# Patient Record
Sex: Female | Born: 1945
Health system: Southern US, Community
[De-identification: ages and names within clinical notes are randomized; demographics above are authoritative.]

## PROBLEM LIST (undated history)

## (undated) DIAGNOSIS — R112 Nausea with vomiting, unspecified: Secondary | ICD-10-CM

## (undated) DIAGNOSIS — T4145XA Adverse effect of unspecified anesthetic, initial encounter: Secondary | ICD-10-CM

## (undated) DIAGNOSIS — C4491 Basal cell carcinoma of skin, unspecified: Secondary | ICD-10-CM

## (undated) DIAGNOSIS — E0500 Thyrotoxicosis with diffuse goiter without thyrotoxic crisis or storm: Secondary | ICD-10-CM

## (undated) DIAGNOSIS — M1990 Unspecified osteoarthritis, unspecified site: Secondary | ICD-10-CM

## (undated) DIAGNOSIS — Z923 Personal history of irradiation: Secondary | ICD-10-CM

## (undated) DIAGNOSIS — H8110 Benign paroxysmal vertigo, unspecified ear: Secondary | ICD-10-CM

## (undated) DIAGNOSIS — J302 Other seasonal allergic rhinitis: Secondary | ICD-10-CM

## (undated) DIAGNOSIS — F411 Generalized anxiety disorder: Secondary | ICD-10-CM

## (undated) DIAGNOSIS — R351 Nocturia: Secondary | ICD-10-CM

## (undated) DIAGNOSIS — C50919 Malignant neoplasm of unspecified site of unspecified female breast: Secondary | ICD-10-CM

## (undated) DIAGNOSIS — K900 Celiac disease: Secondary | ICD-10-CM

## (undated) DIAGNOSIS — N838 Other noninflammatory disorders of ovary, fallopian tube and broad ligament: Secondary | ICD-10-CM

## (undated) DIAGNOSIS — Z9889 Other specified postprocedural states: Secondary | ICD-10-CM

## (undated) DIAGNOSIS — R42 Dizziness and giddiness: Secondary | ICD-10-CM

## (undated) DIAGNOSIS — Z789 Other specified health status: Secondary | ICD-10-CM

## (undated) DIAGNOSIS — R200 Anesthesia of skin: Secondary | ICD-10-CM

## (undated) DIAGNOSIS — I10 Essential (primary) hypertension: Secondary | ICD-10-CM

## (undated) DIAGNOSIS — R202 Paresthesia of skin: Secondary | ICD-10-CM

## (undated) DIAGNOSIS — Z8489 Family history of other specified conditions: Secondary | ICD-10-CM

## (undated) DIAGNOSIS — H9190 Unspecified hearing loss, unspecified ear: Secondary | ICD-10-CM

## (undated) DIAGNOSIS — J45909 Unspecified asthma, uncomplicated: Secondary | ICD-10-CM

## (undated) DIAGNOSIS — R011 Cardiac murmur, unspecified: Secondary | ICD-10-CM

## (undated) DIAGNOSIS — F325 Major depressive disorder, single episode, in full remission: Secondary | ICD-10-CM

## (undated) DIAGNOSIS — Z973 Presence of spectacles and contact lenses: Secondary | ICD-10-CM

## (undated) DIAGNOSIS — K219 Gastro-esophageal reflux disease without esophagitis: Secondary | ICD-10-CM

## (undated) DIAGNOSIS — I499 Cardiac arrhythmia, unspecified: Secondary | ICD-10-CM

## (undated) HISTORY — DX: Other noninflammatory disorders of ovary, fallopian tube and broad ligament: N83.8

## (undated) HISTORY — DX: Thyrotoxicosis with diffuse goiter without thyrotoxic crisis or storm: E05.00

## (undated) HISTORY — DX: Basal cell carcinoma of skin, unspecified: C44.91

## (undated) HISTORY — PX: MOHS SURGERY: SUR867

## (undated) HISTORY — PX: EYE SURGERY: SHX253

## (undated) HISTORY — DX: Major depressive disorder, single episode, in full remission: F32.5

## (undated) HISTORY — DX: Other specified health status: Z78.9

## (undated) HISTORY — DX: Malignant neoplasm of unspecified site of unspecified female breast: C50.919

## (undated) HISTORY — PX: CATARACT EXTRACTION: SUR2

## (undated) HISTORY — DX: Generalized anxiety disorder: F41.1

## (undated) HISTORY — PX: APPENDECTOMY: SHX54

---
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
## 1972-04-24 HISTORY — PX: OVARY SURGERY: SHX727

## 1985-04-24 HISTORY — PX: BREAST BIOPSY: SHX20

## 1992-04-24 DIAGNOSIS — C4491 Basal cell carcinoma of skin, unspecified: Secondary | ICD-10-CM

## 1992-04-24 HISTORY — DX: Basal cell carcinoma of skin, unspecified: C44.91

## 1992-10-22 HISTORY — PX: OTHER SURGICAL HISTORY: SHX169

## 1993-04-24 DIAGNOSIS — C50919 Malignant neoplasm of unspecified site of unspecified female breast: Secondary | ICD-10-CM

## 1993-04-24 HISTORY — DX: Malignant neoplasm of unspecified site of unspecified female breast: C50.919

## 1993-06-22 HISTORY — PX: MASTECTOMY: SHX3

## 1998-03-12 ENCOUNTER — Other Ambulatory Visit: Admission: RE | Admit: 1998-03-12 | Discharge: 1998-03-12 | Payer: Self-pay | Admitting: *Deleted

## 1998-04-09 ENCOUNTER — Ambulatory Visit (HOSPITAL_COMMUNITY): Admission: RE | Admit: 1998-04-09 | Discharge: 1998-04-09 | Payer: Self-pay | Admitting: Gastroenterology

## 1999-01-10 ENCOUNTER — Other Ambulatory Visit: Admission: RE | Admit: 1999-01-10 | Discharge: 1999-01-10 | Payer: Self-pay | Admitting: *Deleted

## 1999-04-25 DIAGNOSIS — Z923 Personal history of irradiation: Secondary | ICD-10-CM | POA: Insufficient documentation

## 1999-04-25 HISTORY — DX: Personal history of irradiation: Z92.3

## 2000-01-23 HISTORY — PX: OTHER SURGICAL HISTORY: SHX169

## 2000-01-27 ENCOUNTER — Other Ambulatory Visit: Admission: RE | Admit: 2000-01-27 | Discharge: 2000-01-27 | Payer: Self-pay | Admitting: *Deleted

## 2000-02-21 ENCOUNTER — Other Ambulatory Visit: Admission: RE | Admit: 2000-02-21 | Discharge: 2000-02-21 | Payer: Self-pay | Admitting: General Surgery

## 2000-03-12 ENCOUNTER — Encounter: Payer: Self-pay | Admitting: Oncology

## 2000-03-12 ENCOUNTER — Encounter: Admission: RE | Admit: 2000-03-12 | Discharge: 2000-03-12 | Payer: Self-pay | Admitting: Oncology

## 2000-04-24 ENCOUNTER — Encounter: Admission: RE | Admit: 2000-04-24 | Discharge: 2000-07-23 | Payer: Self-pay | Admitting: Radiation Oncology

## 2001-01-28 ENCOUNTER — Other Ambulatory Visit: Admission: RE | Admit: 2001-01-28 | Discharge: 2001-01-28 | Payer: Self-pay | Admitting: *Deleted

## 2001-03-14 ENCOUNTER — Ambulatory Visit (HOSPITAL_COMMUNITY): Admission: RE | Admit: 2001-03-14 | Discharge: 2001-03-14 | Payer: Self-pay | Admitting: Oncology

## 2001-03-14 ENCOUNTER — Encounter: Payer: Self-pay | Admitting: Oncology

## 2002-01-02 ENCOUNTER — Other Ambulatory Visit: Admission: RE | Admit: 2002-01-02 | Discharge: 2002-01-02 | Payer: Self-pay | Admitting: *Deleted

## 2002-01-20 ENCOUNTER — Encounter: Admission: RE | Admit: 2002-01-20 | Discharge: 2002-01-20 | Payer: Self-pay | Admitting: Internal Medicine

## 2002-02-07 ENCOUNTER — Encounter: Admission: RE | Admit: 2002-02-07 | Discharge: 2002-02-07 | Payer: Self-pay | Admitting: Internal Medicine

## 2003-01-15 ENCOUNTER — Other Ambulatory Visit: Admission: RE | Admit: 2003-01-15 | Discharge: 2003-01-15 | Payer: Self-pay | Admitting: *Deleted

## 2003-03-10 ENCOUNTER — Encounter (INDEPENDENT_AMBULATORY_CARE_PROVIDER_SITE_OTHER): Payer: Self-pay

## 2003-03-10 ENCOUNTER — Ambulatory Visit (HOSPITAL_COMMUNITY): Admission: RE | Admit: 2003-03-10 | Discharge: 2003-03-10 | Payer: Self-pay | Admitting: Gastroenterology

## 2003-08-10 ENCOUNTER — Ambulatory Visit (HOSPITAL_COMMUNITY): Admission: RE | Admit: 2003-08-10 | Discharge: 2003-08-10 | Payer: Self-pay | Admitting: Internal Medicine

## 2003-08-10 IMAGING — CR DG CHEST 2V
2 series · 2 of 2 positions shown · non-contrast
Comparison: none

CLINICAL DATA: Positive PPD.  
 PA AND LATERAL CHEST:  
 Heart and mediastinal contours are within normal limits.  The lung fields are clear with no evidence for focal infiltrate or congestive failure.  No radiographic stigmata of tuberculosis are noted in this patient with a history of positive PPD.  No signs to suggest metastatic disease are noted in this patient with a history of breast cancer.  
 Bony structures demonstrate mild thoracic scoliosis convex right.  Surgical clips are identified in the left axillary region and anterior left chest wall as the patient is status post left mastectomy.
 IMPRESSION
 No acute cardiopulmonary disease.

[view not recorded (1 of 2)]
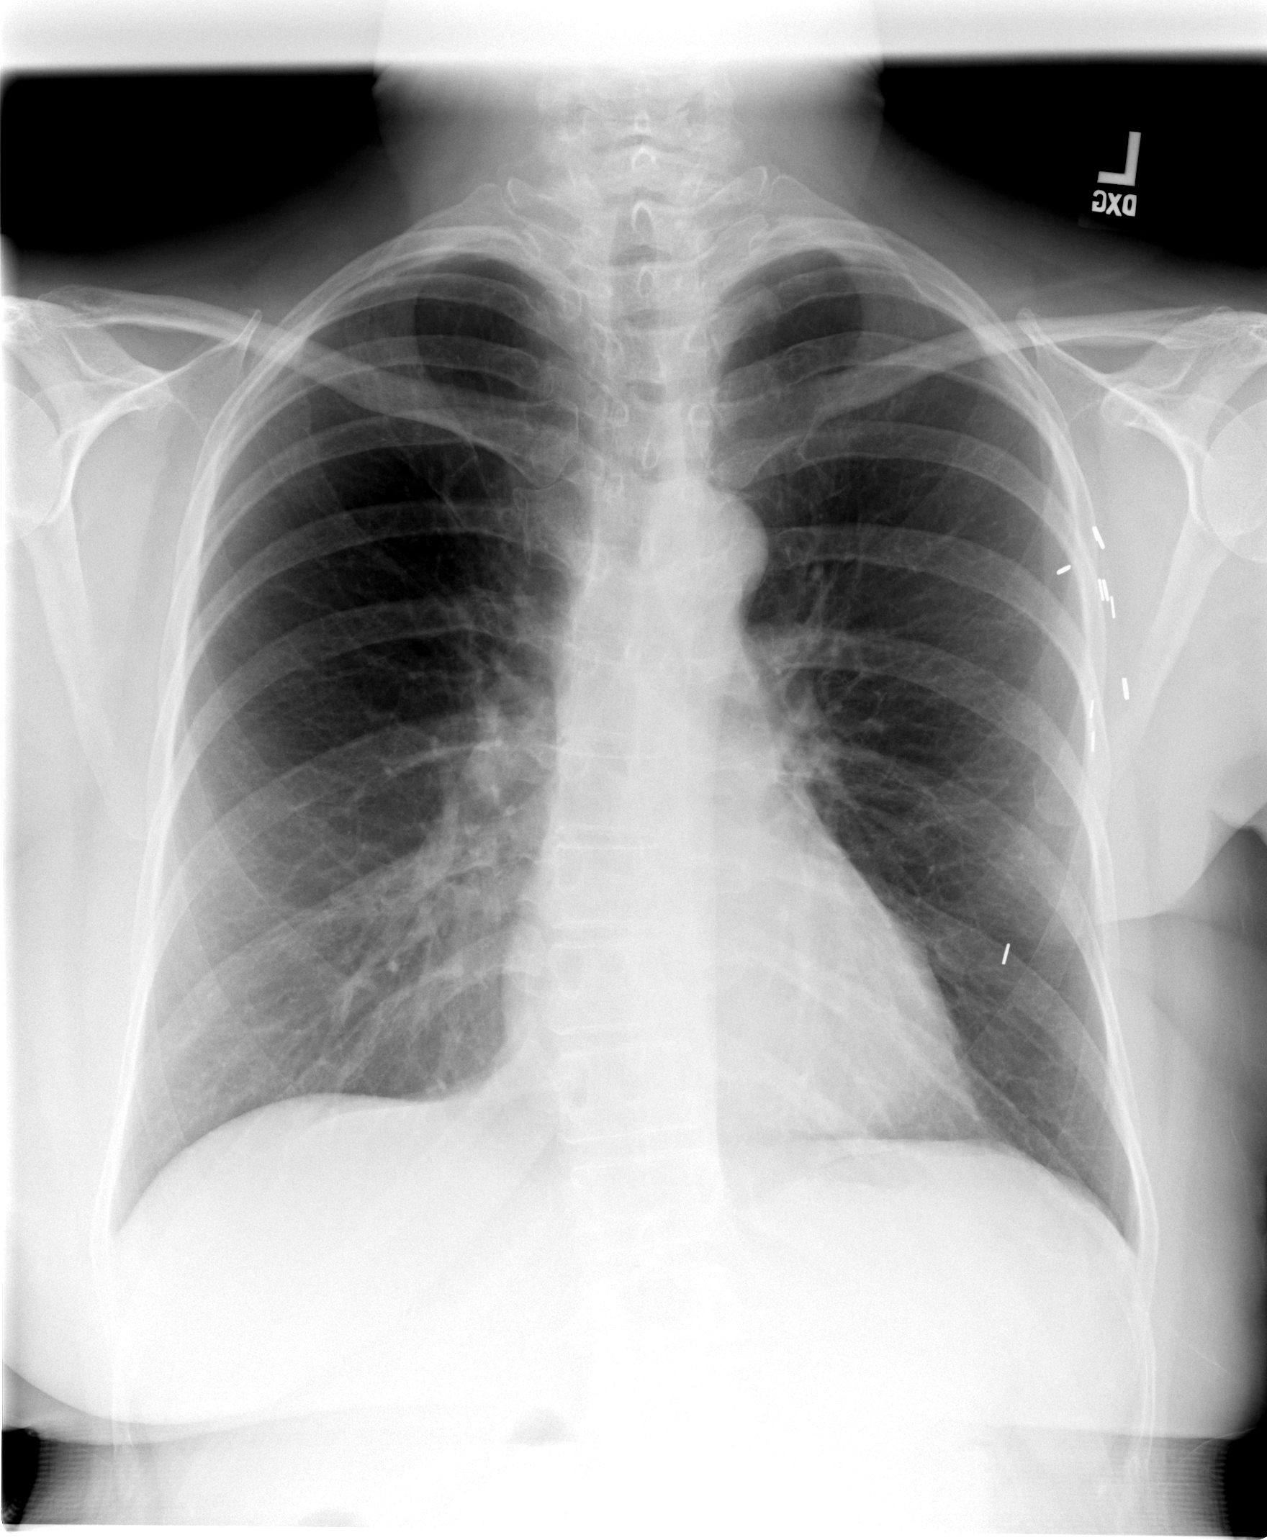

[view not recorded (2 of 2)]
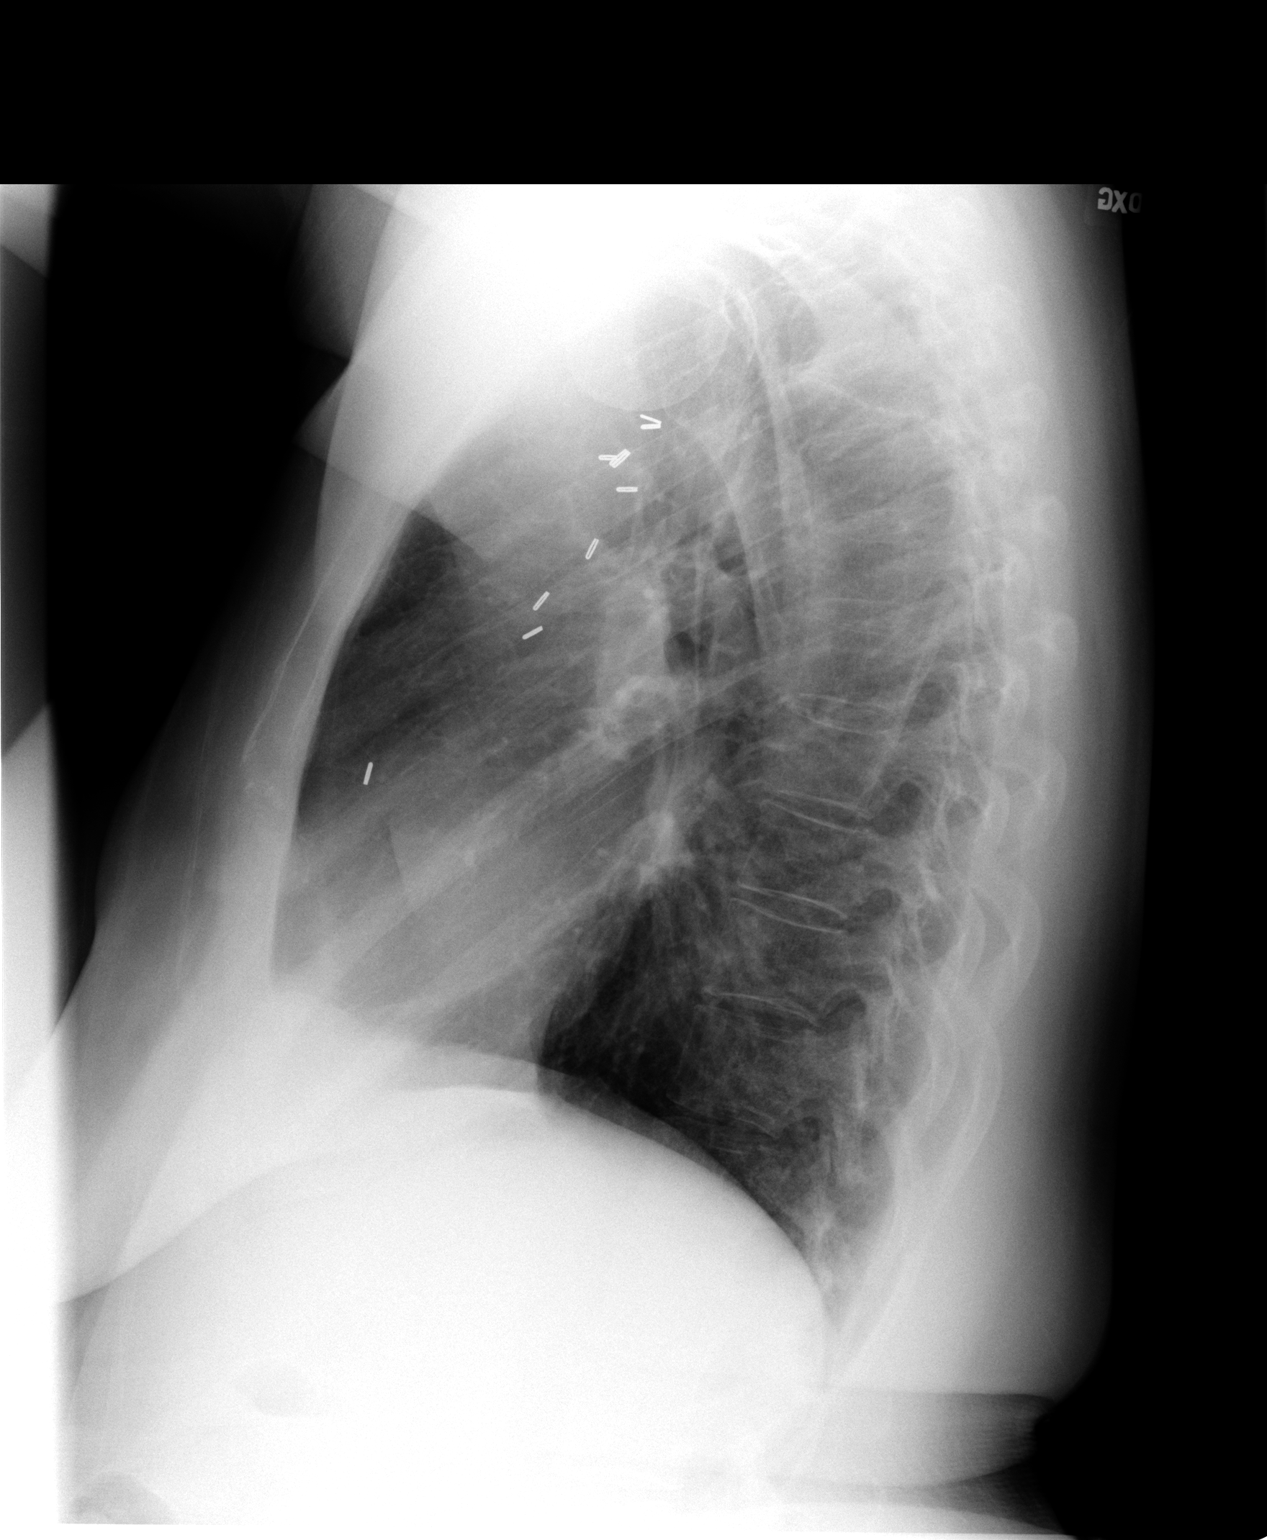

[2 of 2 positions shown; findings below may reference images not displayed]

## 2004-05-09 ENCOUNTER — Ambulatory Visit: Payer: Self-pay | Admitting: Oncology

## 2004-11-04 ENCOUNTER — Ambulatory Visit: Payer: Self-pay | Admitting: Oncology

## 2005-04-24 DIAGNOSIS — E0500 Thyrotoxicosis with diffuse goiter without thyrotoxic crisis or storm: Secondary | ICD-10-CM | POA: Insufficient documentation

## 2005-04-24 HISTORY — DX: Thyrotoxicosis with diffuse goiter without thyrotoxic crisis or storm: E05.00

## 2005-05-05 ENCOUNTER — Ambulatory Visit: Payer: Self-pay | Admitting: Oncology

## 2005-06-01 ENCOUNTER — Ambulatory Visit: Payer: Self-pay | Admitting: Oncology

## 2005-12-20 ENCOUNTER — Other Ambulatory Visit: Admission: RE | Admit: 2005-12-20 | Discharge: 2005-12-20 | Payer: Self-pay | Admitting: Obstetrics & Gynecology

## 2006-01-17 ENCOUNTER — Ambulatory Visit (HOSPITAL_COMMUNITY): Admission: RE | Admit: 2006-01-17 | Discharge: 2006-01-17 | Payer: Self-pay | Admitting: Internal Medicine

## 2006-01-17 IMAGING — CR DG CHEST 2V
2 series · 2 of 2 positions shown · non-contrast
Comparison: [DATE].

CLINICAL DATA: Positive PPD.  Breast cancer.
 CHEST ? 2 VIEW:

[view not recorded (1 of 2)]
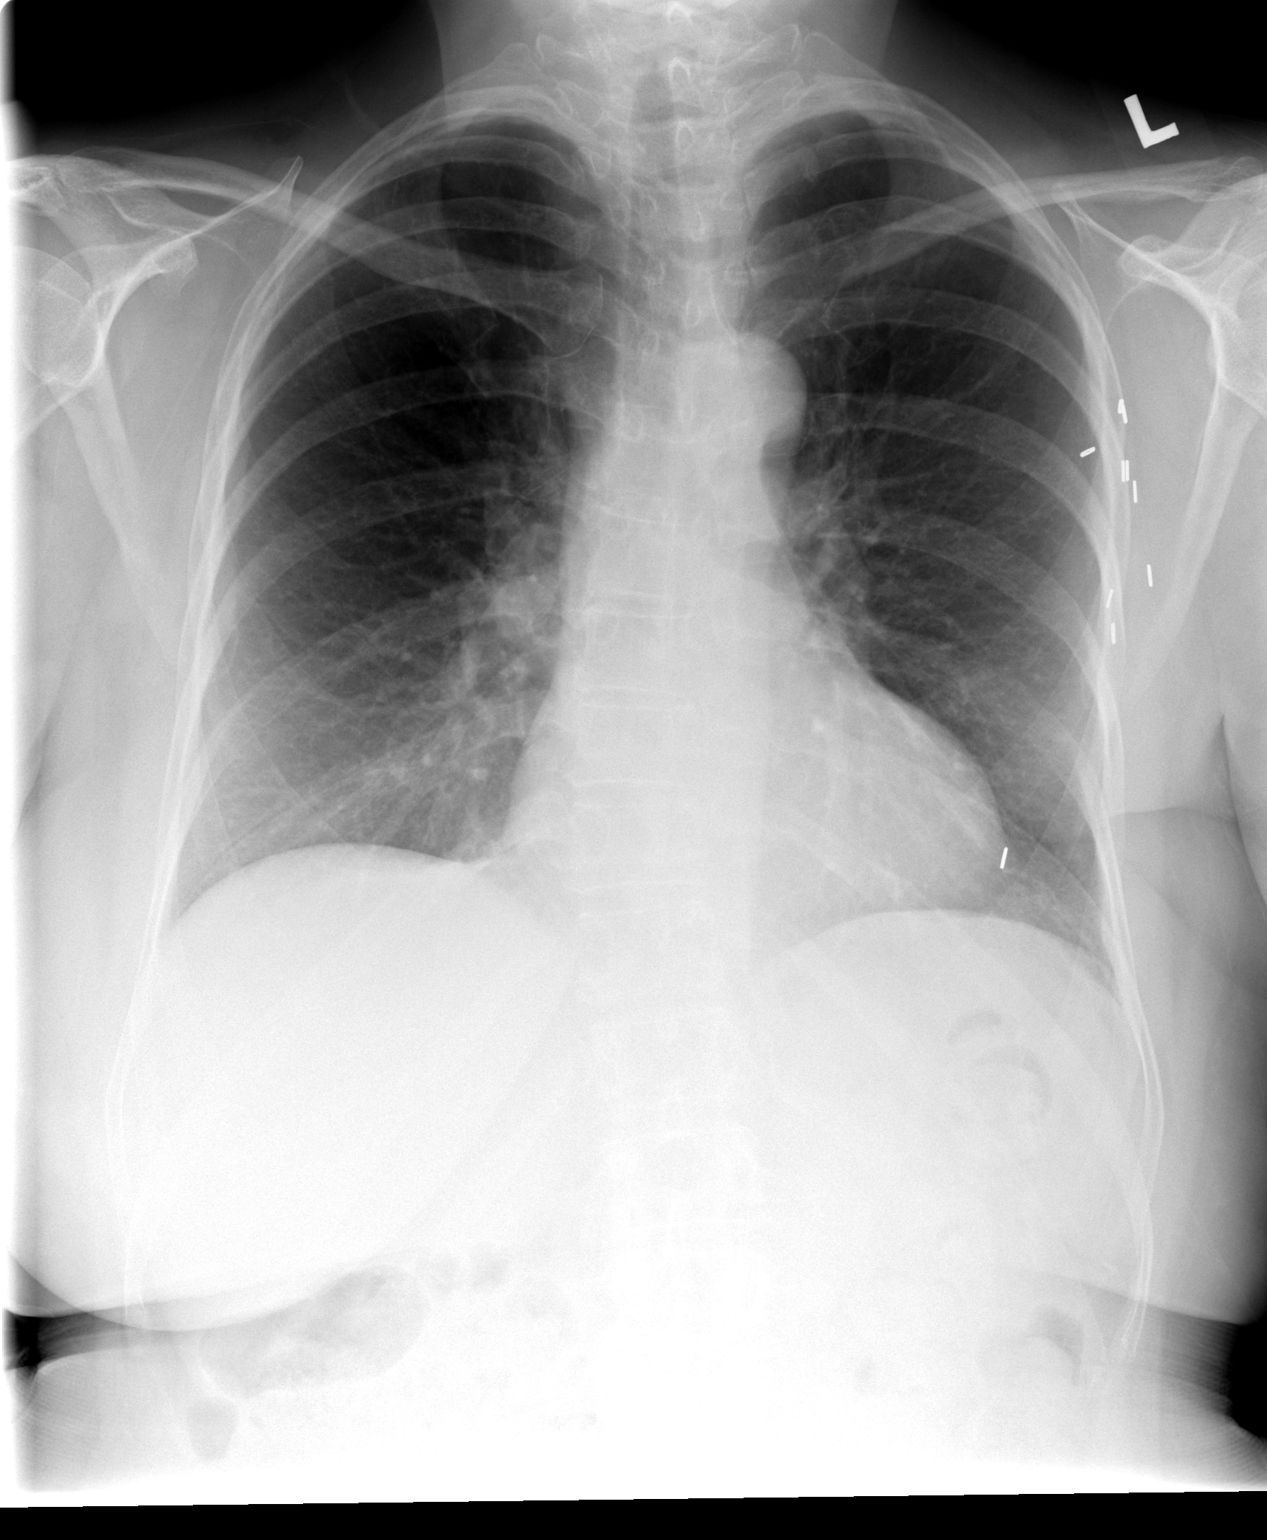

[view not recorded (2 of 2)]
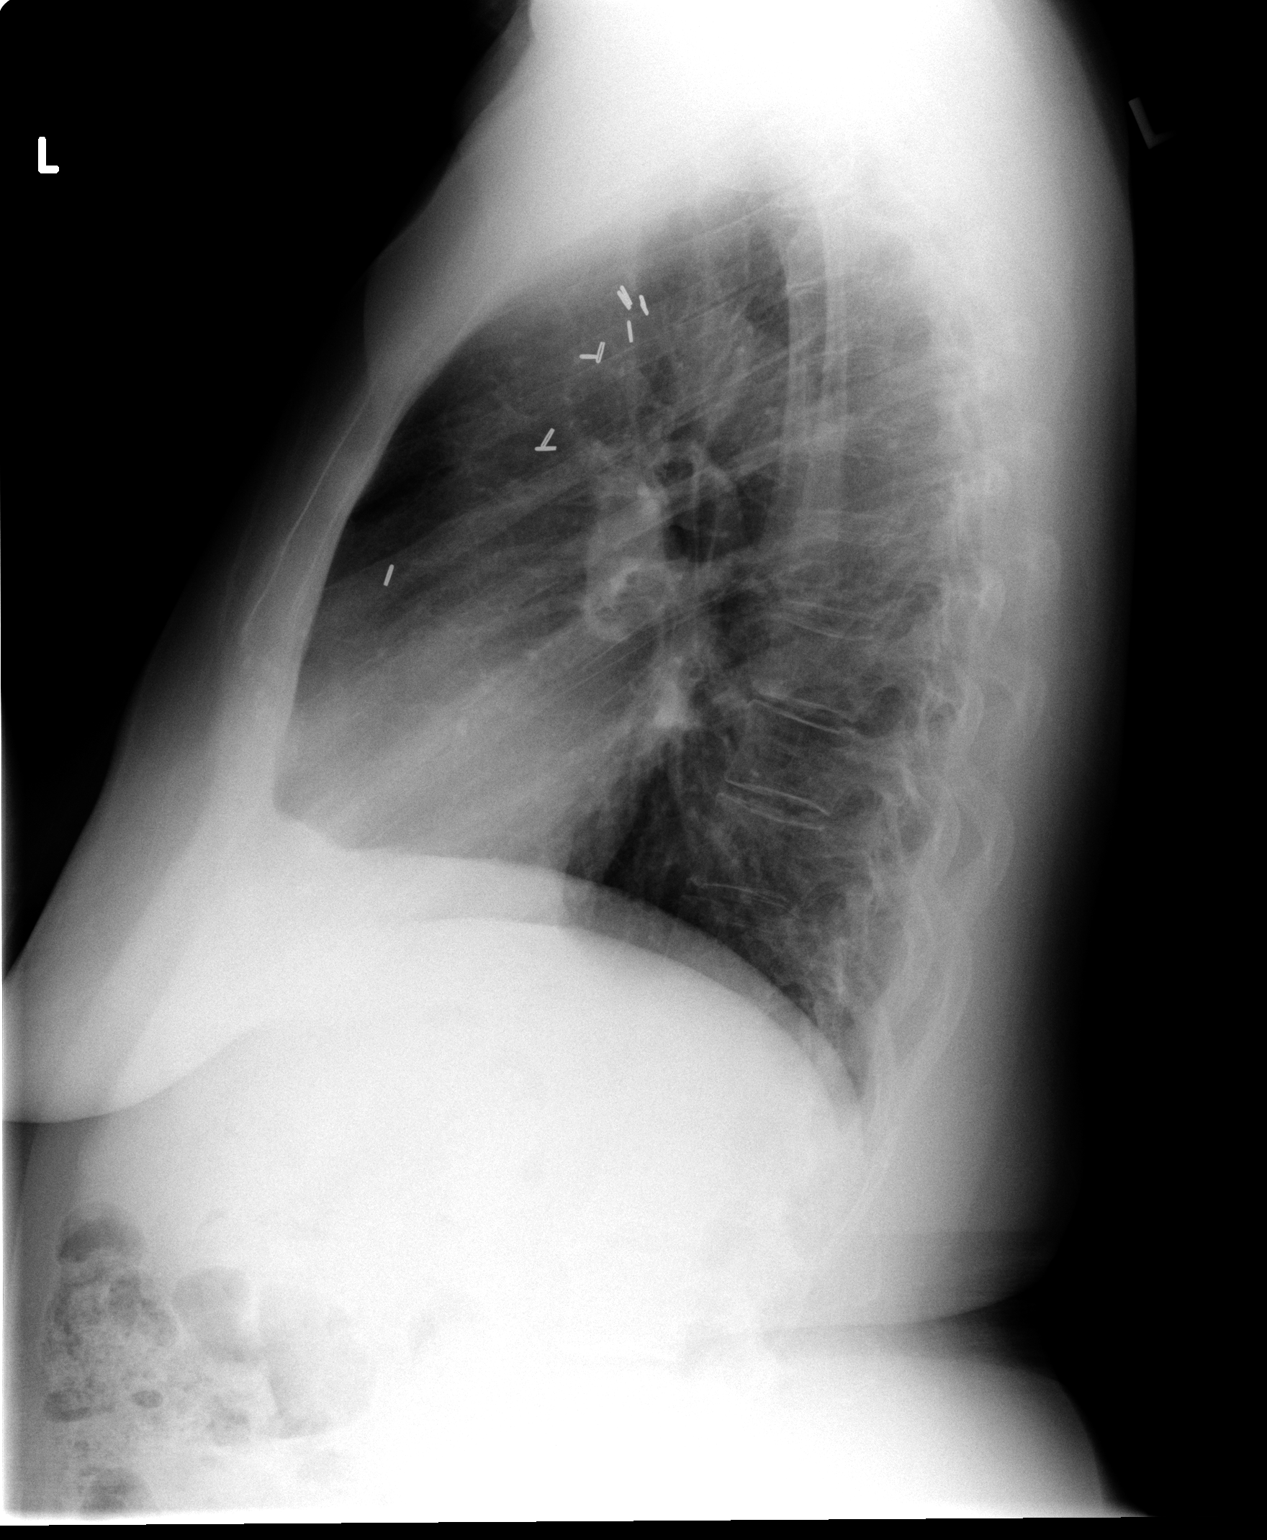

[2 of 2 positions shown; findings below may reference images not displayed]

FINDINGS: Heart size and mediastinal contours are normal.  Both lungs are clear.  There is no evidence of pleural effusion.  No mass or adenopathy identified.  The patient has undergone previous left mastectomy and axillary lymph node dissection.  No significant change is seen since the prior study.
IMPRESSION: Stable chest.  No active disease.

## 2006-01-26 ENCOUNTER — Ambulatory Visit: Payer: Self-pay | Admitting: Oncology

## 2006-11-02 ENCOUNTER — Ambulatory Visit: Payer: Self-pay | Admitting: Oncology

## 2006-12-25 ENCOUNTER — Other Ambulatory Visit: Admission: RE | Admit: 2006-12-25 | Discharge: 2006-12-25 | Payer: Self-pay | Admitting: Obstetrics & Gynecology

## 2007-08-13 ENCOUNTER — Ambulatory Visit: Payer: Self-pay | Admitting: Oncology

## 2008-01-01 ENCOUNTER — Other Ambulatory Visit: Admission: RE | Admit: 2008-01-01 | Discharge: 2008-01-01 | Payer: Self-pay | Admitting: Obstetrics & Gynecology

## 2008-05-12 ENCOUNTER — Ambulatory Visit: Payer: Self-pay | Admitting: Oncology

## 2008-11-09 ENCOUNTER — Ambulatory Visit (HOSPITAL_COMMUNITY): Admission: RE | Admit: 2008-11-09 | Discharge: 2008-11-09 | Payer: Self-pay | Admitting: Internal Medicine

## 2008-11-09 IMAGING — CR DG CHEST 2V
2 series · 2 of 2 positions shown · non-contrast
Comparison: [DATE]

CLINICAL DATA: Positive PPD.  Morning cough.  History of breast
cancer [SW] with recurrence of [SW].  Nonsmoker

CHEST - 2 VIEW

[view not recorded (1 of 2)]
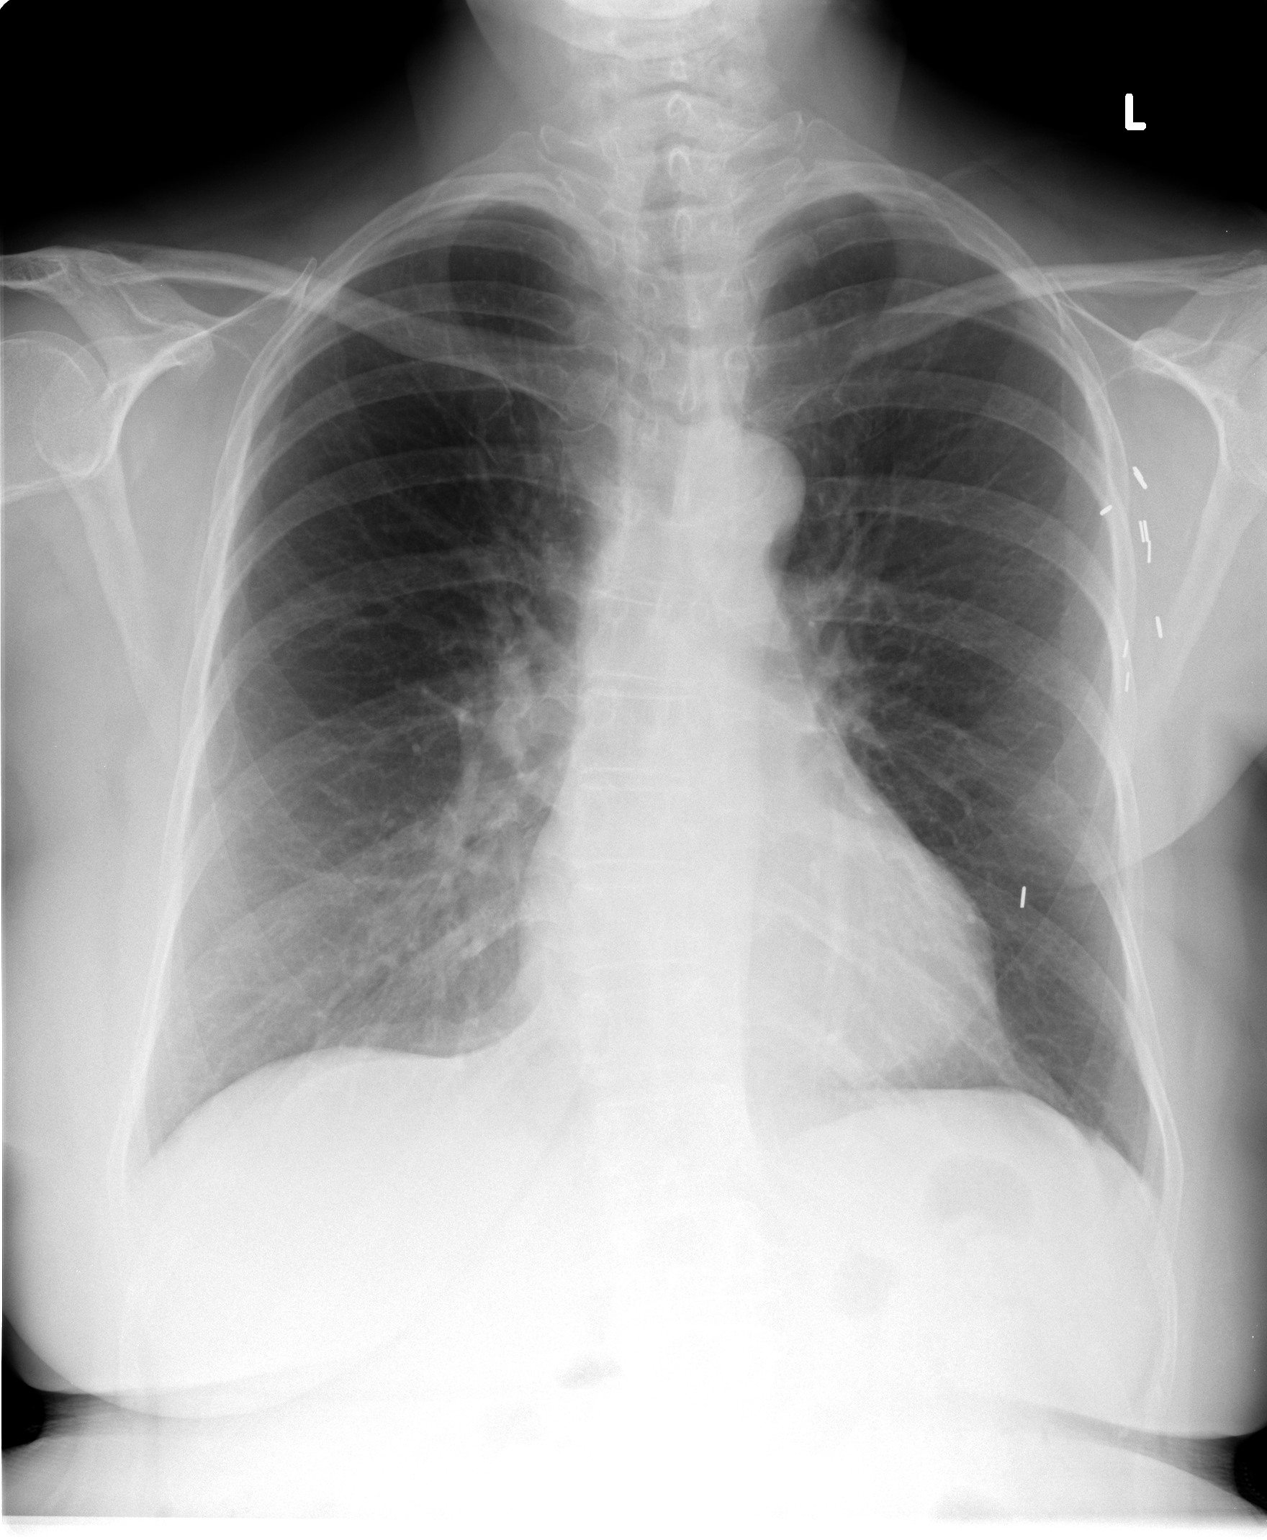

[view not recorded (2 of 2)]
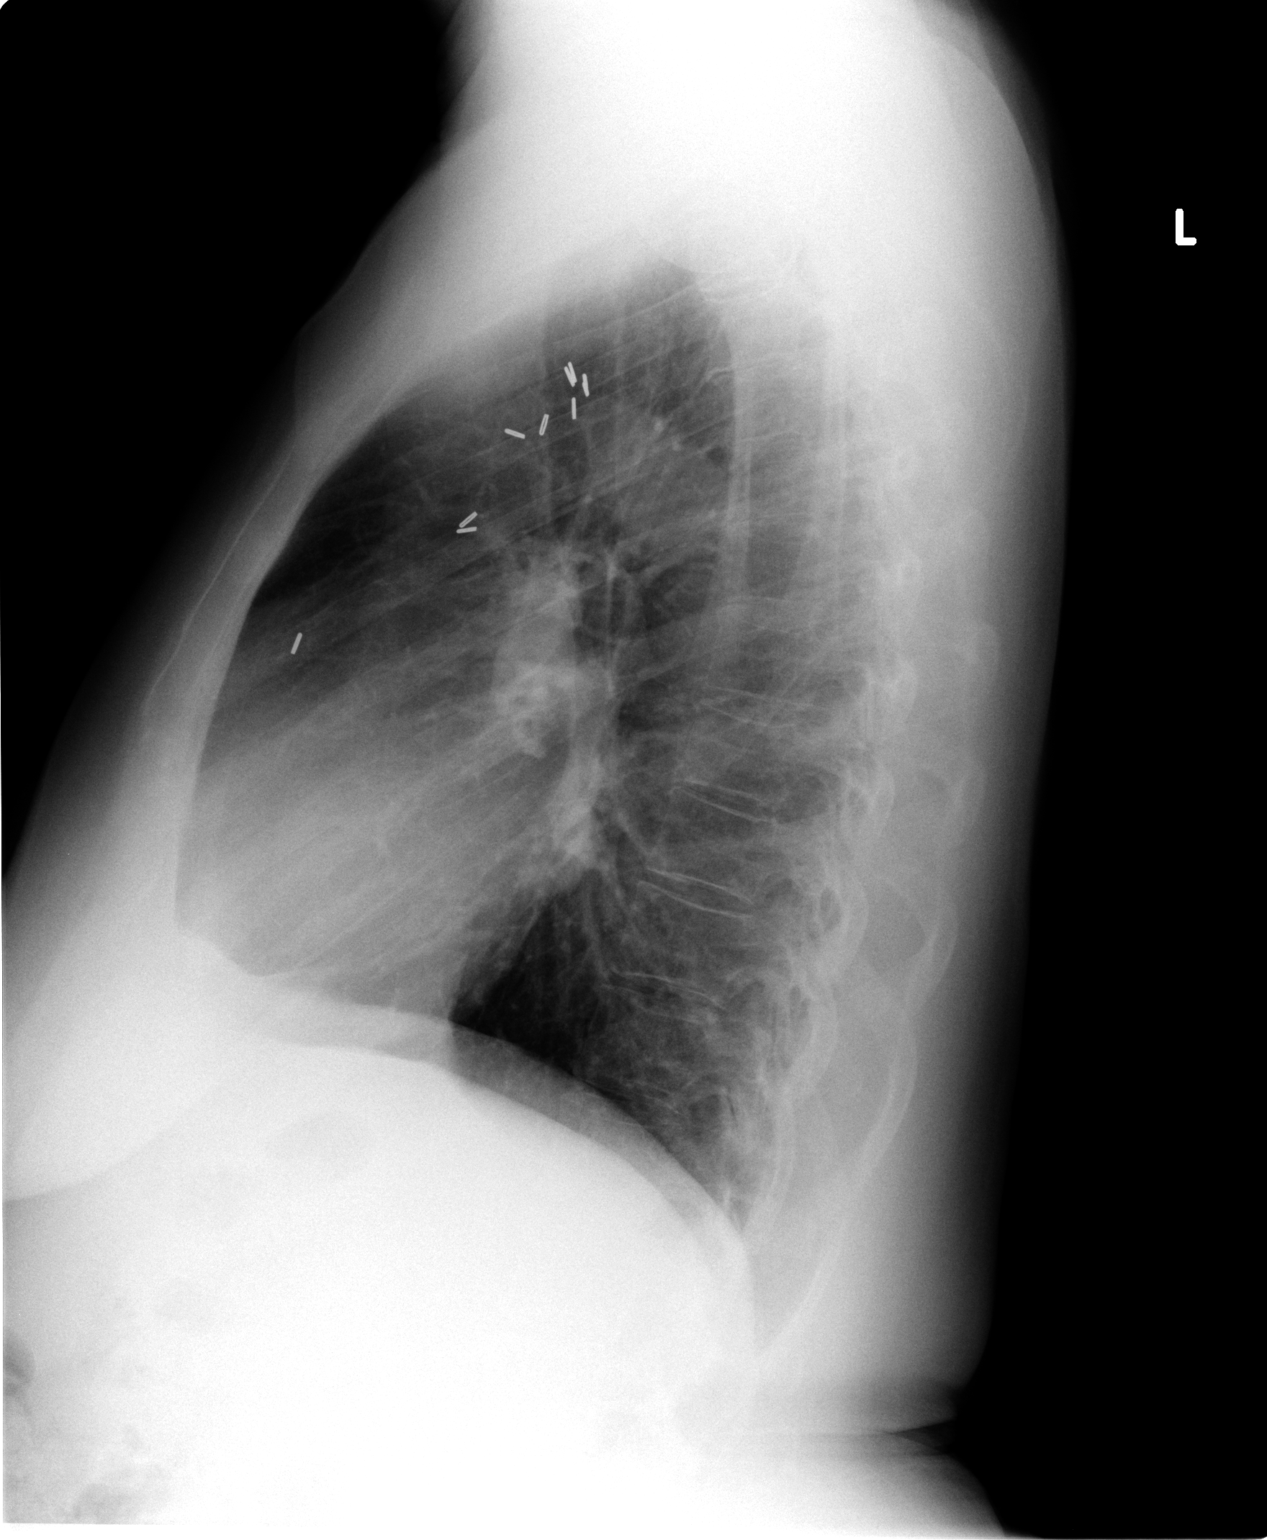

[2 of 2 positions shown; findings below may reference images not displayed]

FINDINGS: The patient is status post left mastectomy and surgical
clips are identified in the left sided soft tissues and axillary
region.  Heart and mediastinal contours are within normal limits
and are stable.  The lung fields appear clear with no signs of
focal infiltrate or congestive failure.  Specifically no nodularity
is seen to suggest metastatic disease and no radiographic findings
suggestive of tuberculosis are noted.  No pleural fluid is seen.

Bony structures are intact.
IMPRESSION: Stable cardiopulmonary appearance with no acute abnormality noted.

## 2009-05-13 ENCOUNTER — Ambulatory Visit (HOSPITAL_COMMUNITY): Admission: RE | Admit: 2009-05-13 | Discharge: 2009-05-13 | Payer: Self-pay | Admitting: Internal Medicine

## 2009-05-13 IMAGING — CR DG LUMBAR SPINE COMPLETE 4+V
5 series · 5 of 5 positions shown · non-contrast
Comparison: Report from prior exam in [[facility]]

CLINICAL DATA: Chronic lower back pain

LUMBAR SPINE - COMPLETE 4+ VIEW

[view not recorded (1 of 5)]
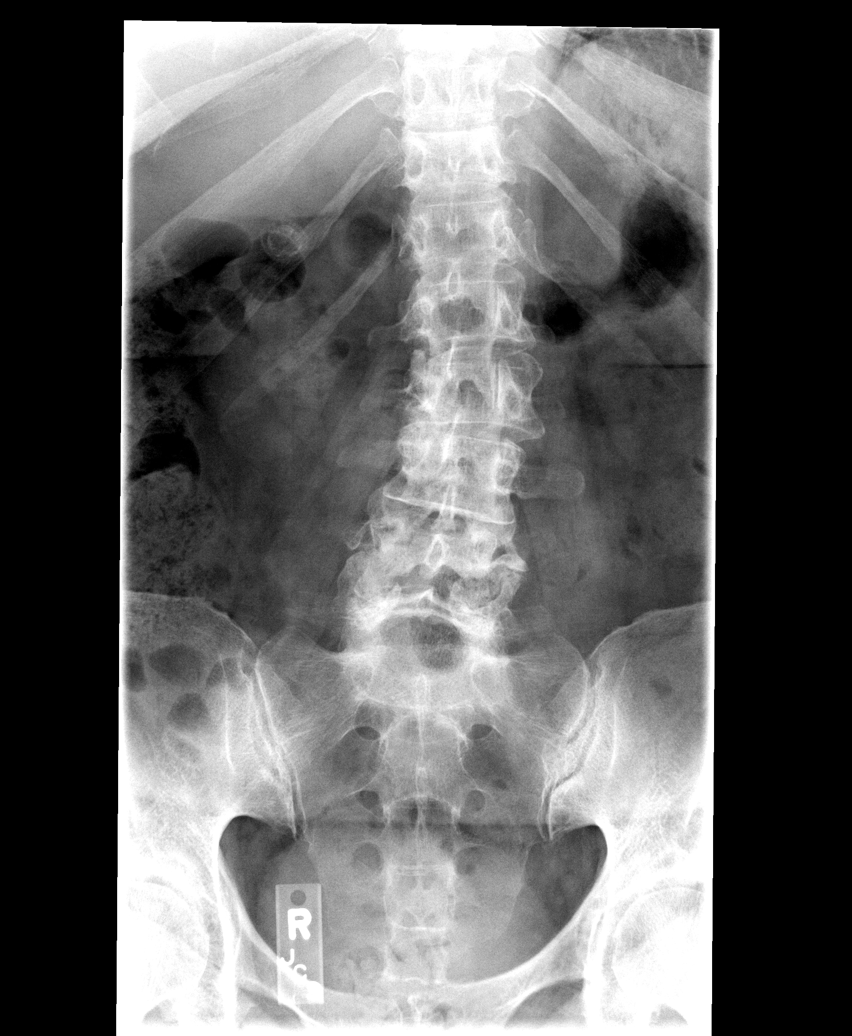

[view not recorded (2 of 5)]
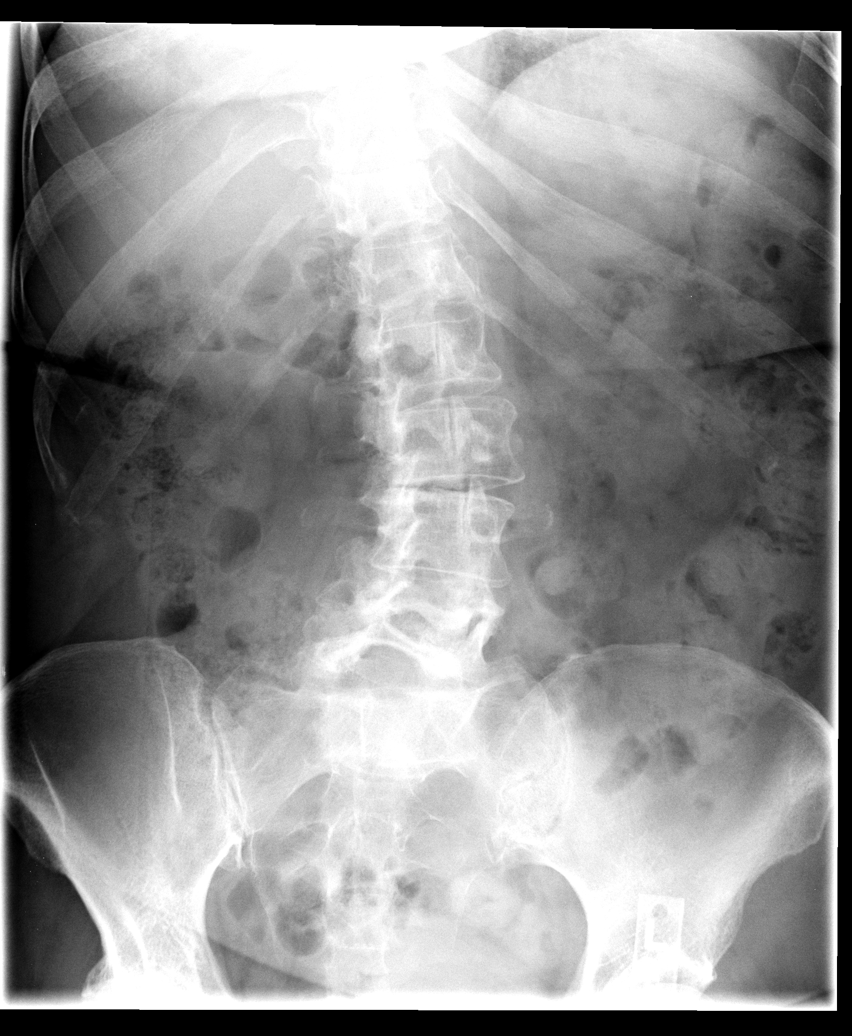

[view not recorded (3 of 5)]
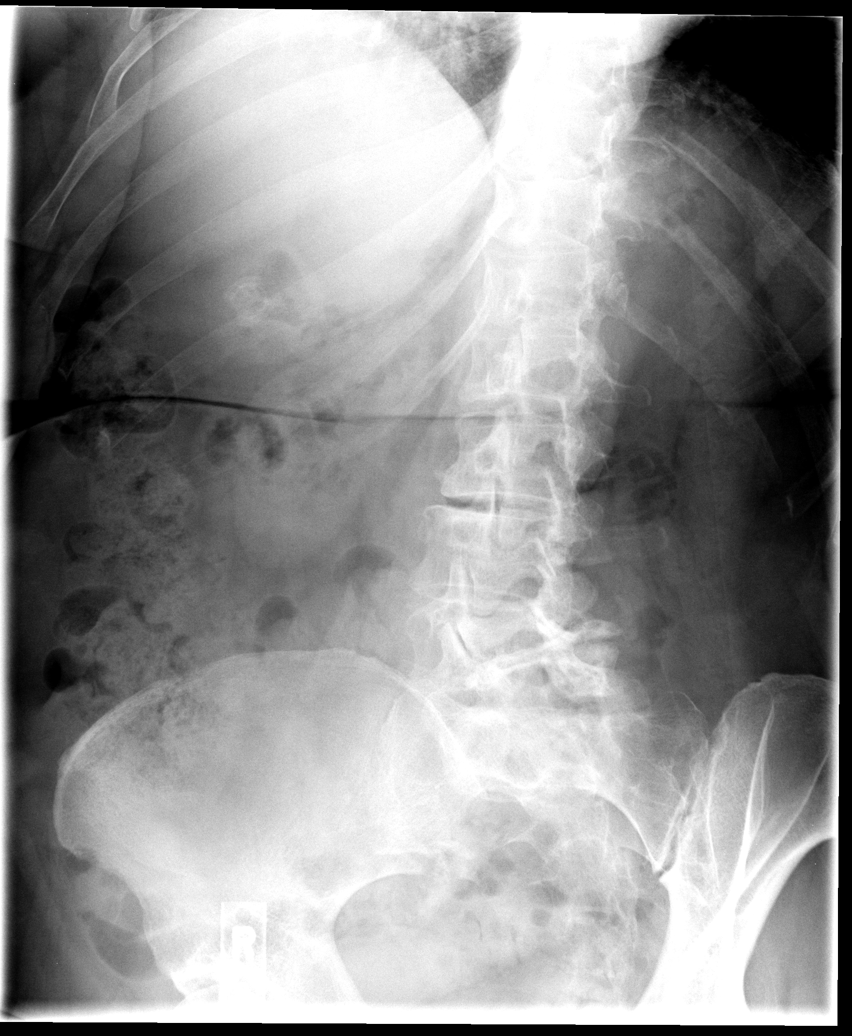

[view not recorded (4 of 5)]
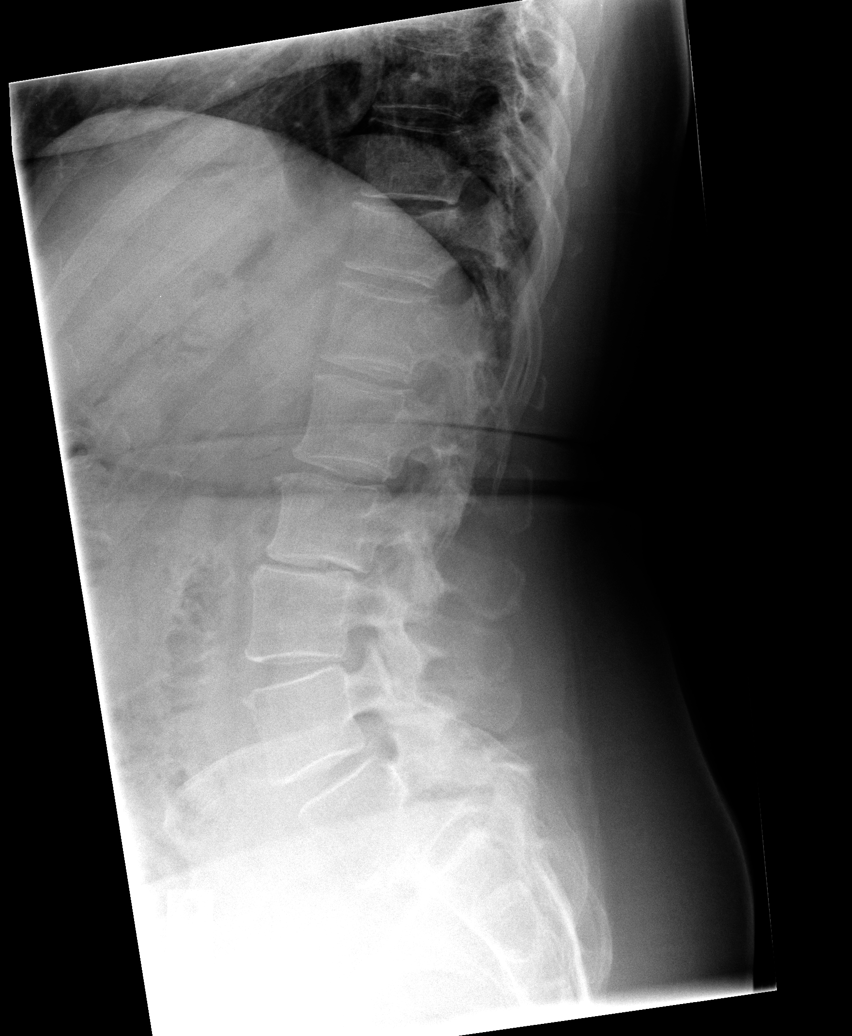

[view not recorded (5 of 5)]
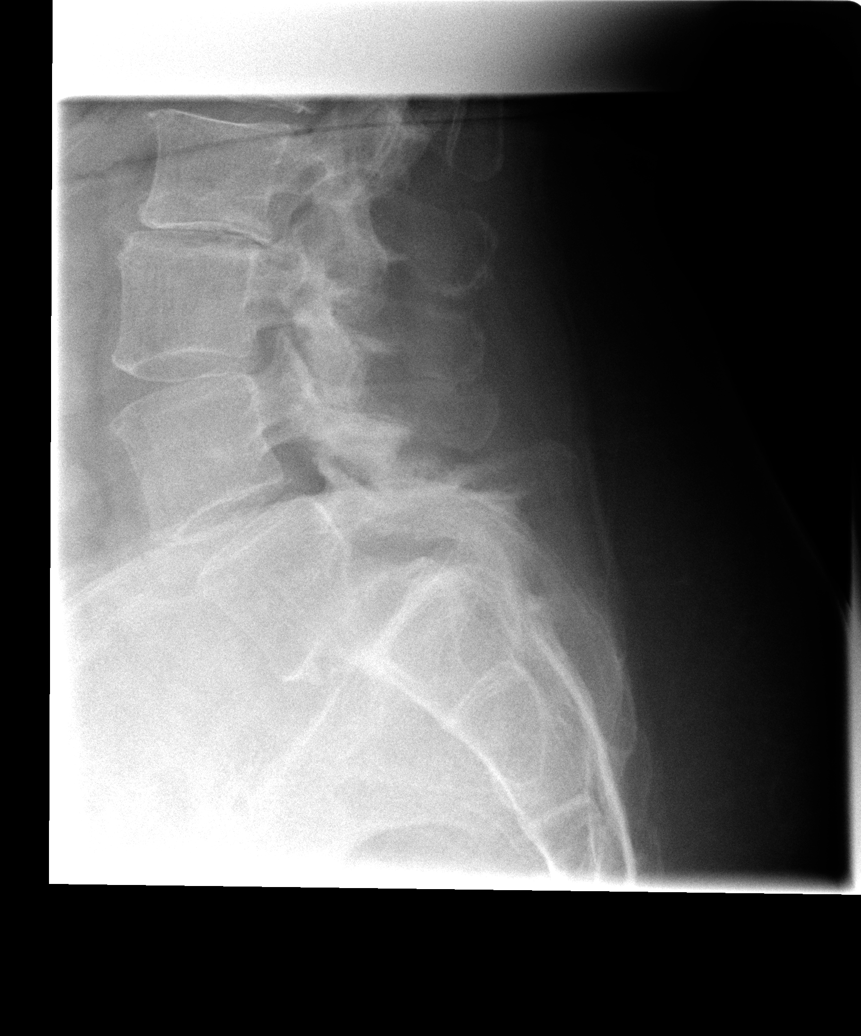

[5 of 5 positions shown; findings below may reference images not displayed]

FINDINGS: There are five lumbar type vertebral bodies identified.
Curvature of the lower portion of the lumbar spine is seen.  There
is anterolisthesis of L5 on S1 measuring 12 mm.  This was by report
present in [[facility]] to the same degree.  Retrolisthesis of L2 on L3 is
seen measuring 6 mm.

Vertebral body heights appear maintained.  There is disc space
narrowing identified at the L2-3 and L5 S1 levels with near
obliteration of the disc space at the L5 S1 level.  Oblique views
demonstrate probable pars defects at the L4-5 and L5 S1 levels with
associated facet sclerosis and hypertrophy.  Milder degrees of
facet sclerosis are seen at the higher levels.  The degree of
associated osteophytosis at the L4-5 and L5 S1 levels suggest
chronic reaction to the pars defects.  No definite acute bony
abnormality is noted.  The sacral white lines appear intact.

A calcified gallstone is noted in the right upper quadrant.
IMPRESSION: Degenerative changes of the lumbosacral spine as described above.
In comparison with prior report from [[facility]], these changes were
present to some degree then and appear to be chronic.  No definite
focal acute abnormality is seen.

Cholelithiasis

## 2009-05-13 IMAGING — CR DG KNEE COMPLETE 4+V*L*
4 series · 4 of 4 positions shown · non-contrast
Comparison: None

CLINICAL DATA: Left knee pain

LEFT KNEE - COMPLETE 4+ VIEW

[view not recorded (1 of 4)]
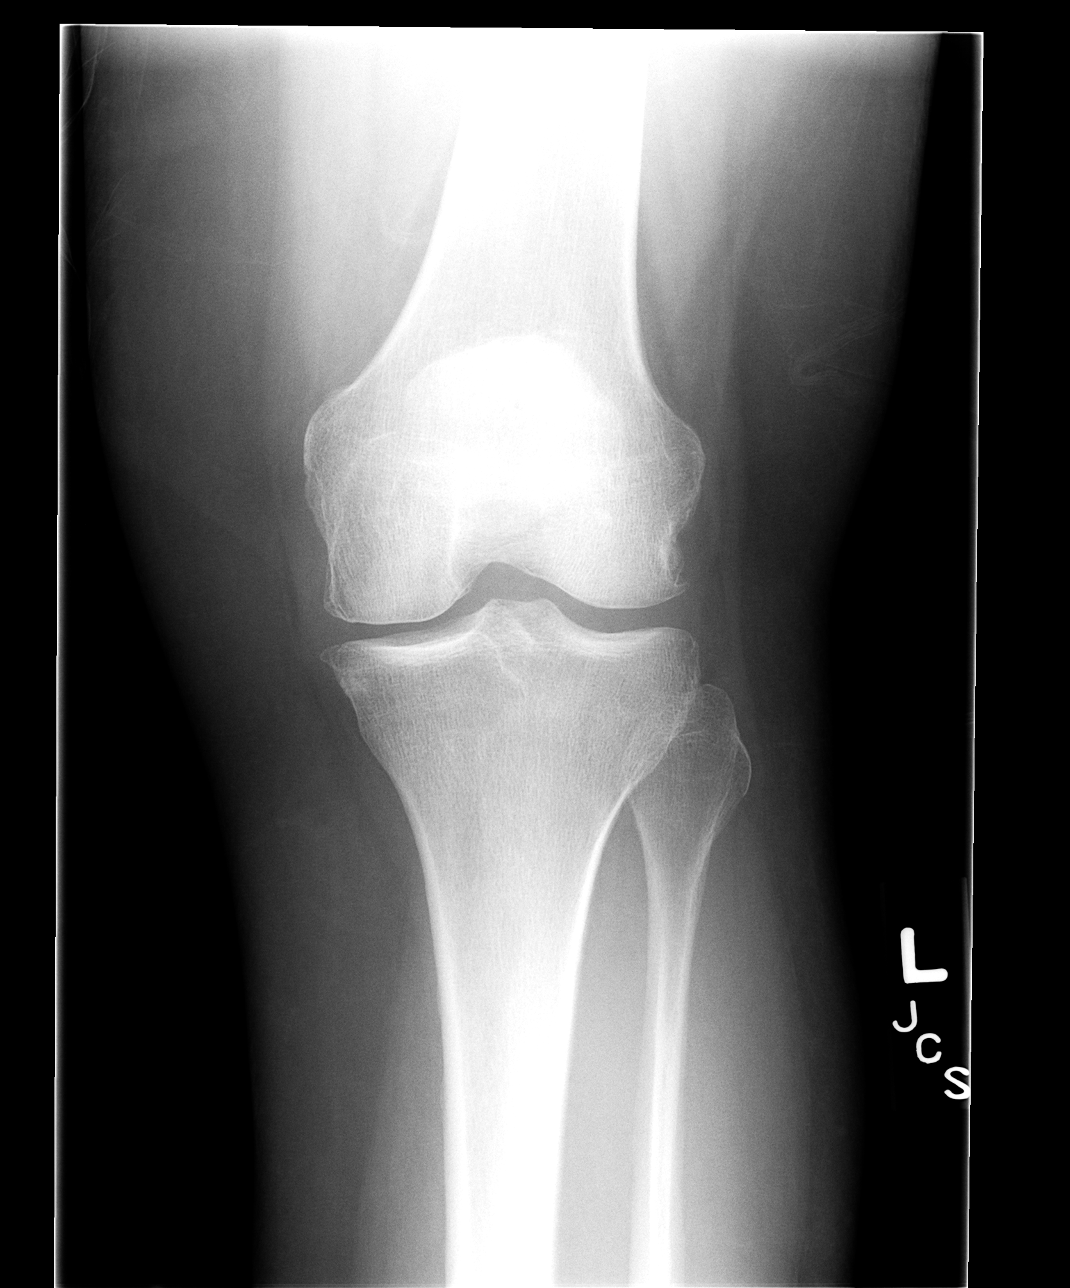

[view not recorded (2 of 4)]
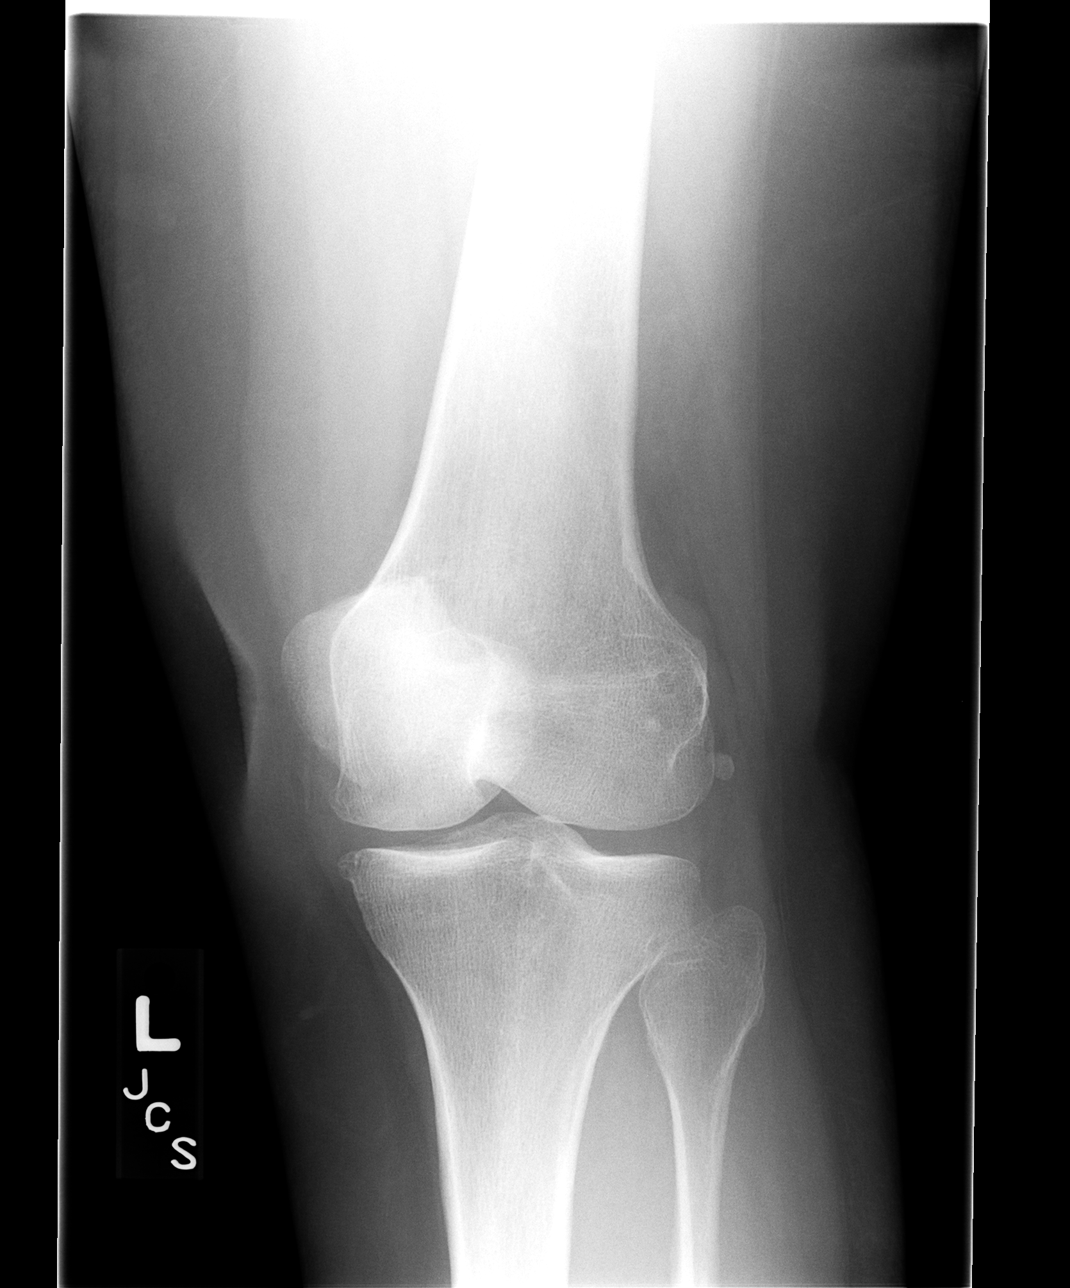

[view not recorded (3 of 4)]
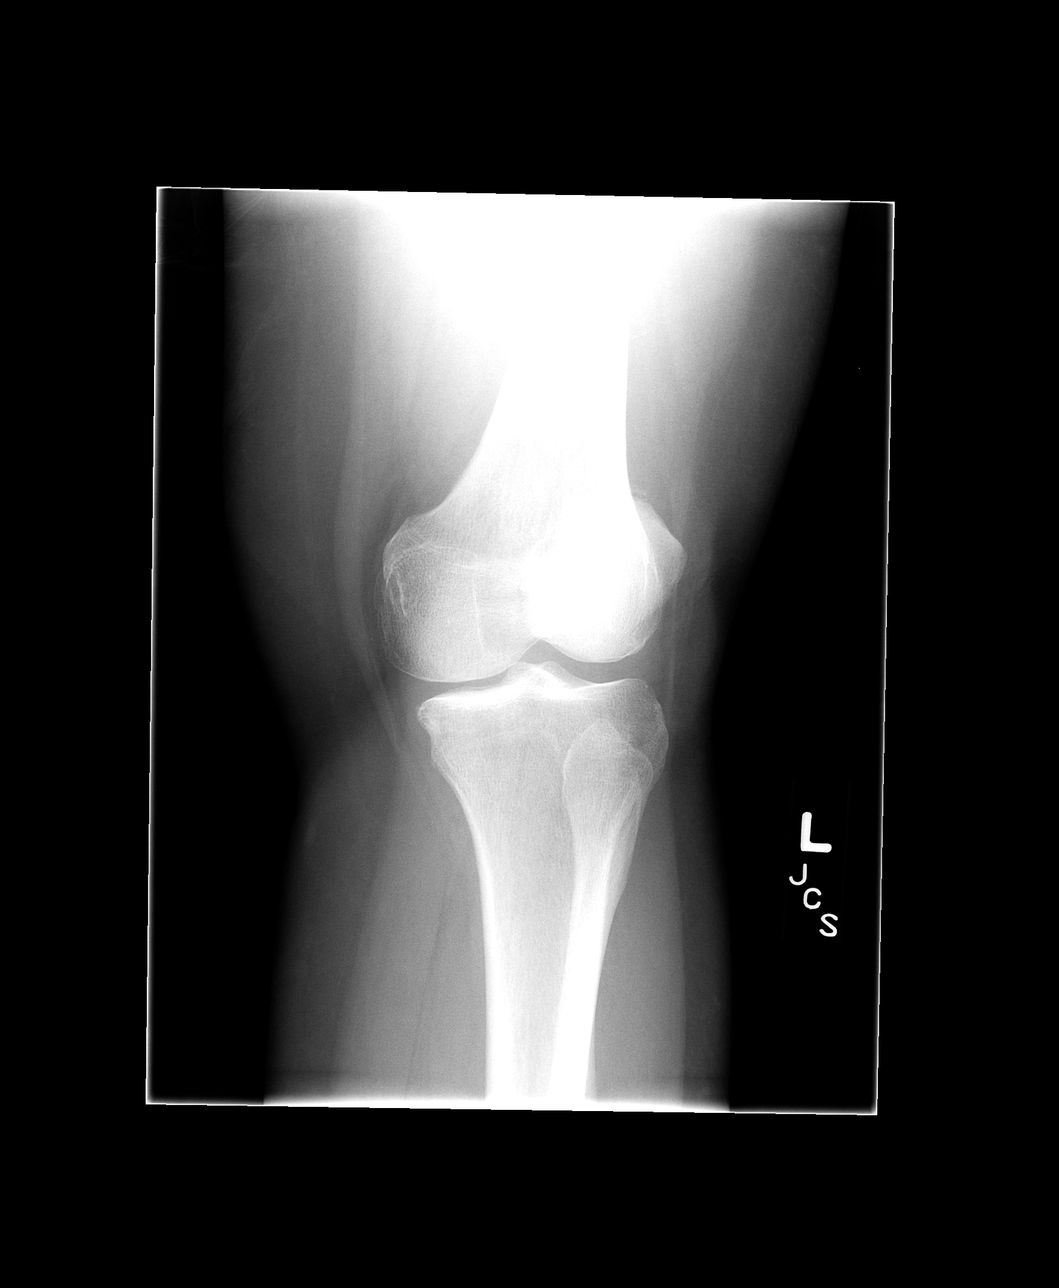

[view not recorded (4 of 4)]
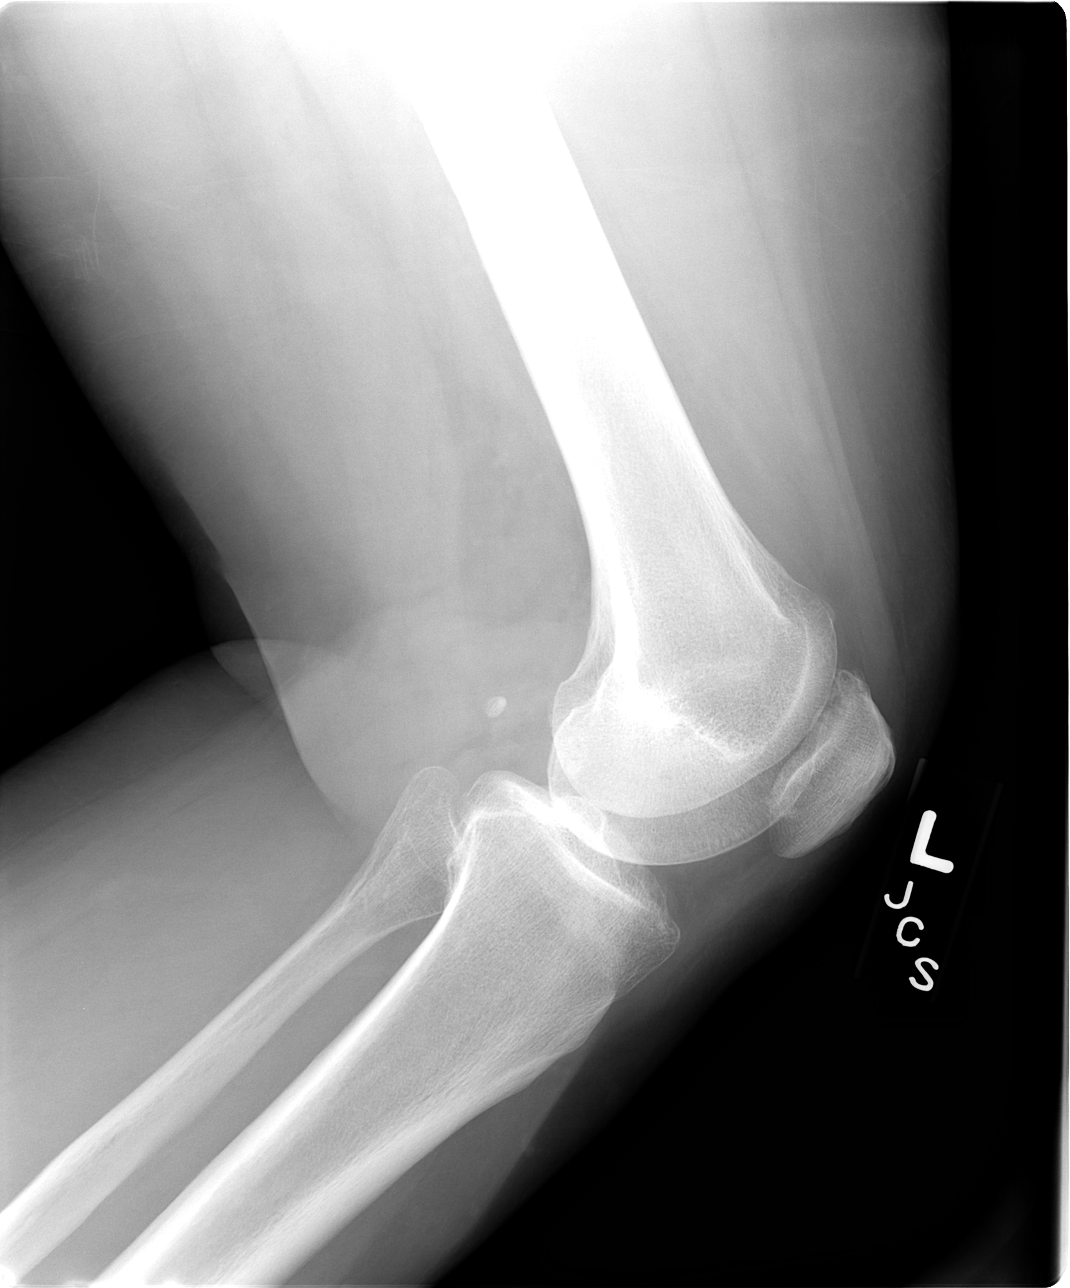

[4 of 4 positions shown; findings below may reference images not displayed]

FINDINGS: Bone density is within normal limits for age.  There is
mild medial joint space narrowing with associated subchondral
sclerosis of the tibia and some minimal degenerative osteophytosis
compatible with mild degenerative change.  Some mild osteophytosis
of the superior aspect of the patella may be present as well.  No
joint effusions or focal soft tissue abnormalities are seen.  No
abnormal calcification is seen around the knee joint to suggest the
presence of calcific bursitis or tendonitis.

No focal bony abnormality is otherwise noted.
IMPRESSION: Mild degenerative change as noted above.

## 2009-05-13 IMAGING — CR DG HIP (WITH OR WITHOUT PELVIS) 2-3V*L*
3 series · 3 of 3 positions shown · non-contrast
Comparison: None

CLINICAL DATA: Left hip pain radiating down the left flank in the

LEFT HIP - COMPLETE 2+ VIEW

[view not recorded (1 of 3)]
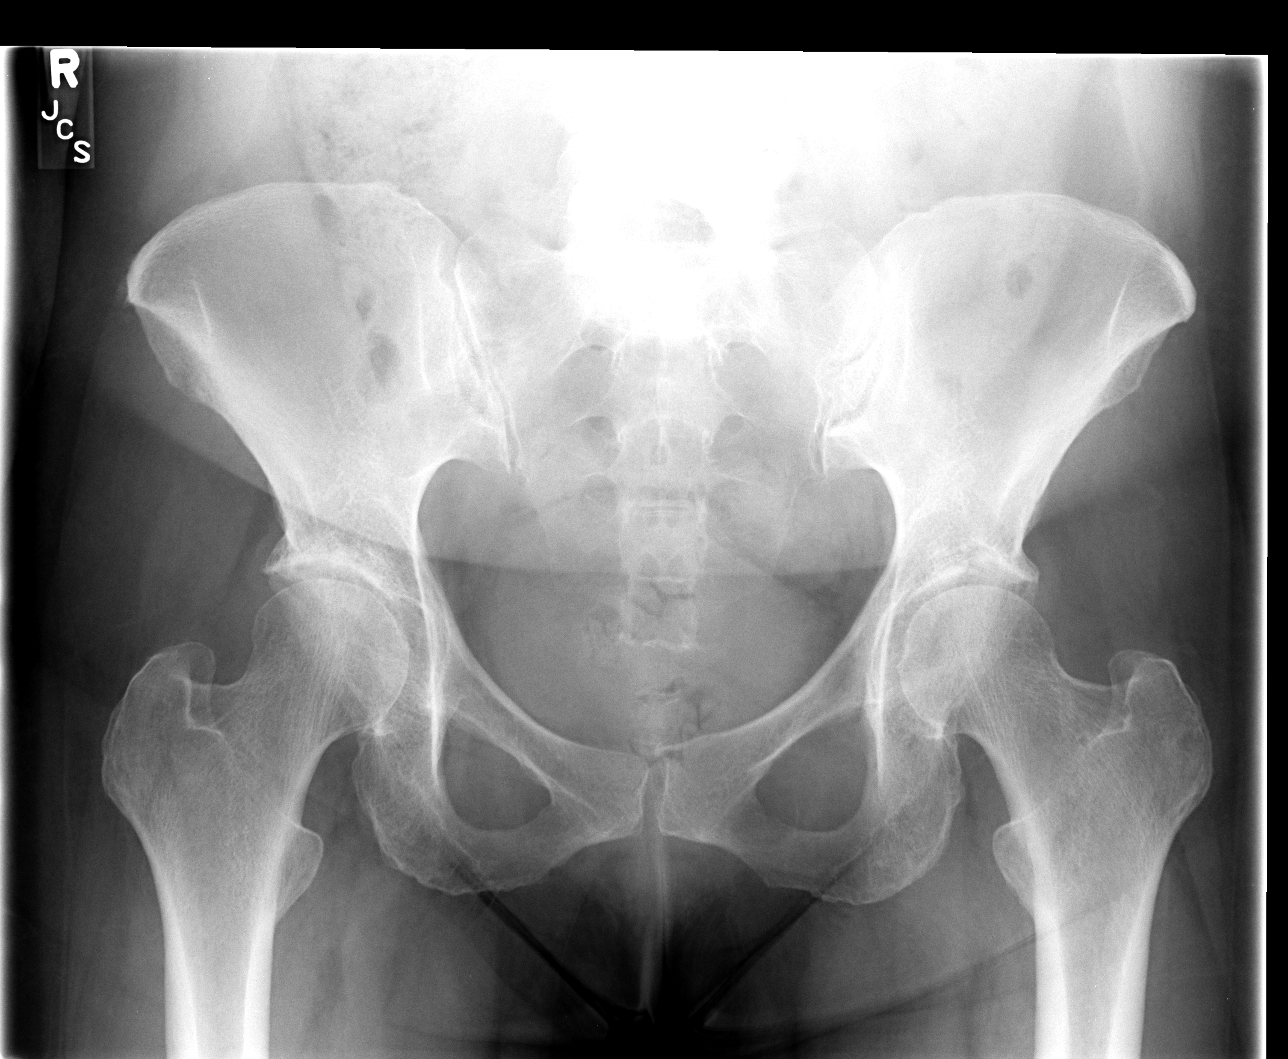

[view not recorded (2 of 3)]
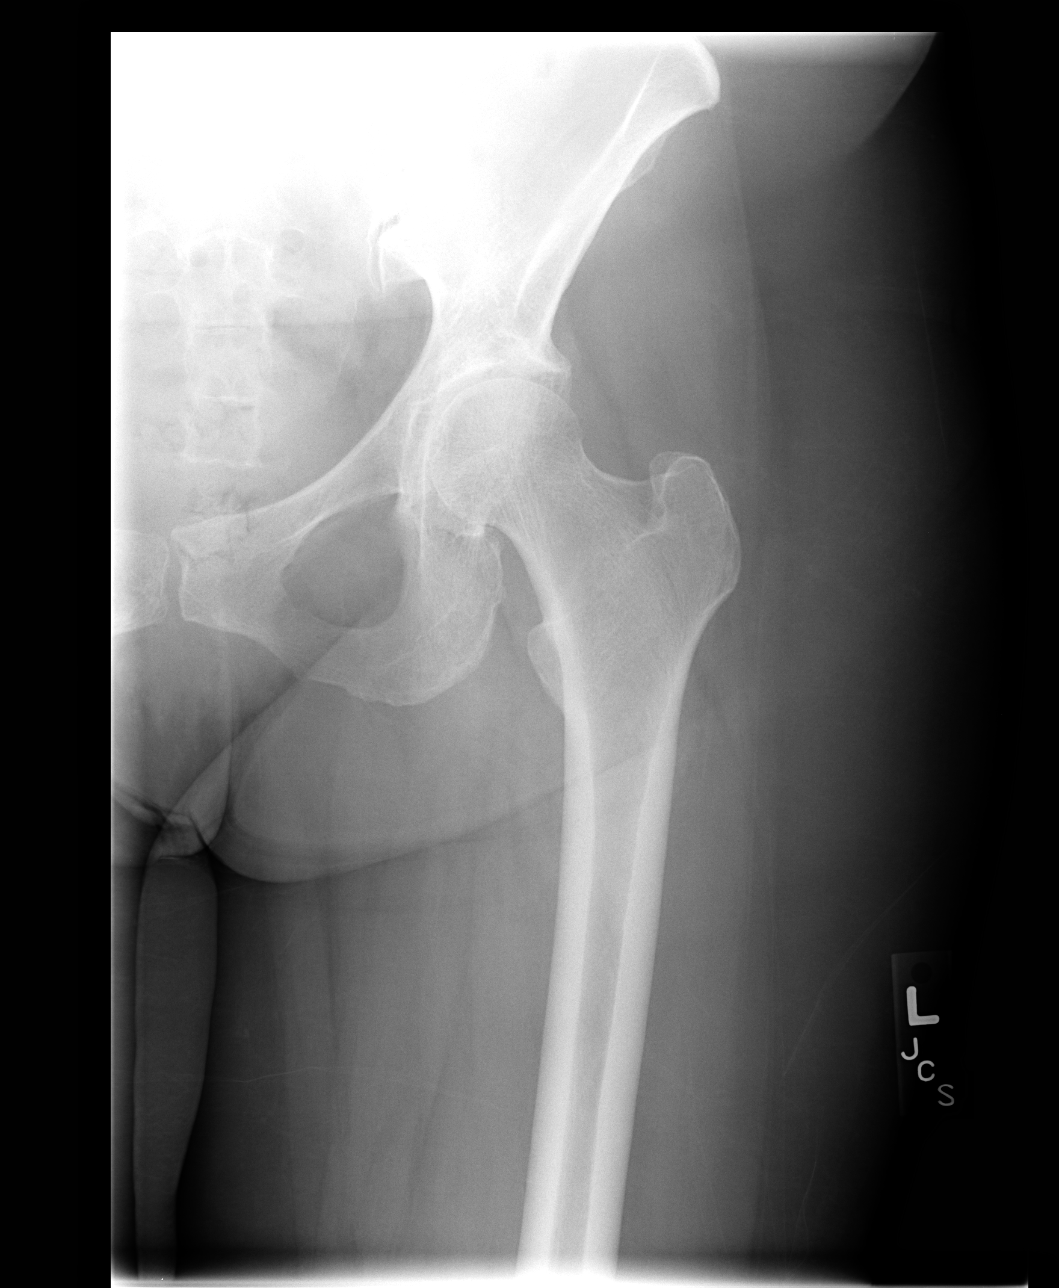

[view not recorded (3 of 3)]
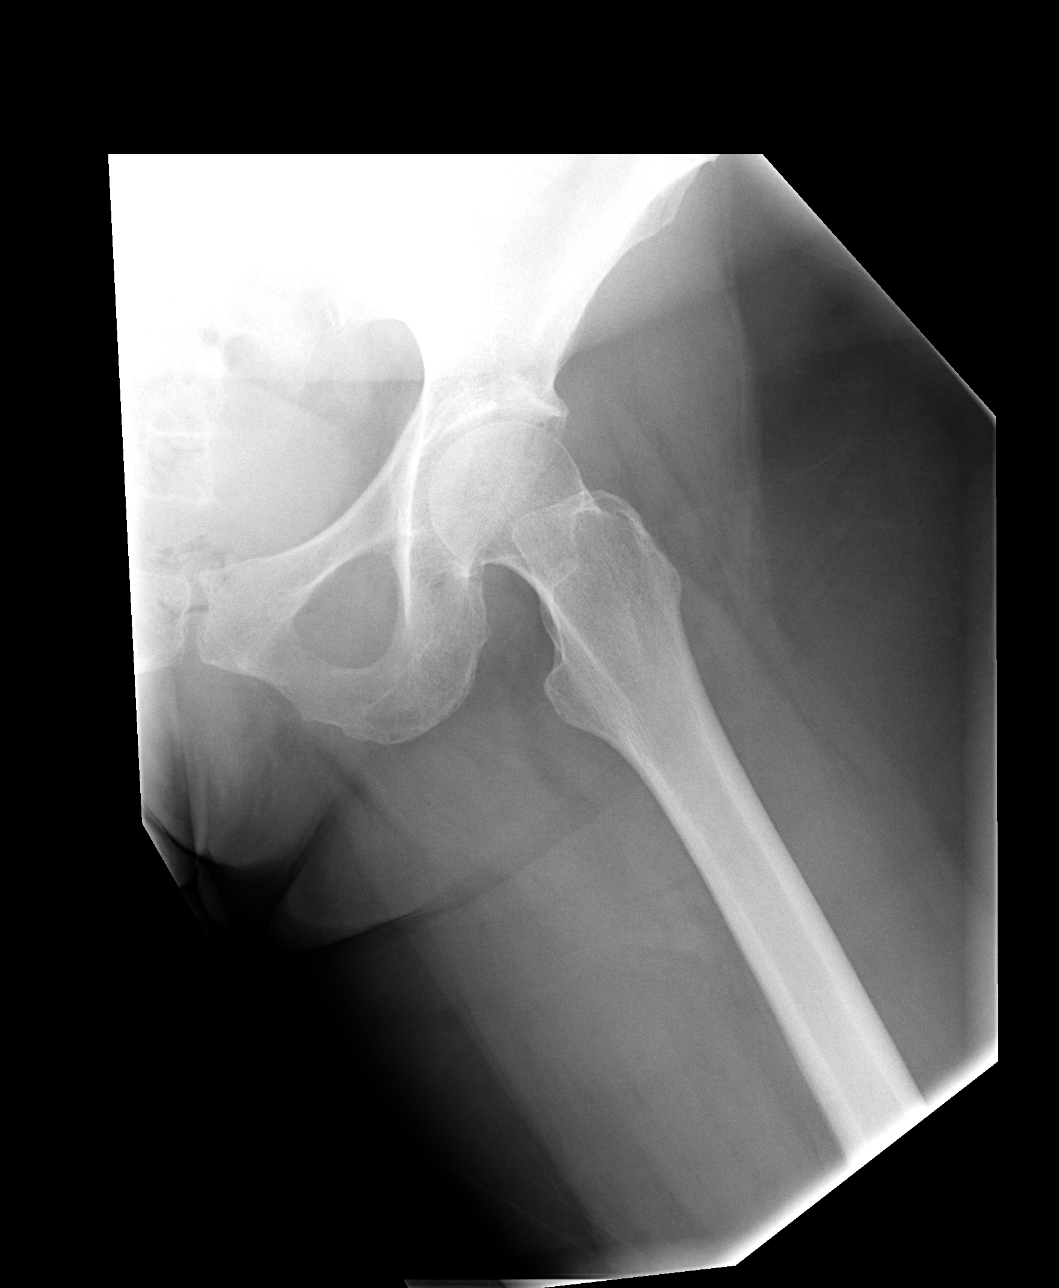

[3 of 3 positions shown; findings below may reference images not displayed]

FINDINGS: Bone density is within normal limits for age.  There is
mild bilateral superior joint space narrowing with associated
subchondral sclerosis and lateral osteophytosis. Subchondral cyst
formation is also noted on the left and these findings are
compatible with mild/moderate degenerative change.  The sacral
white lines are maintained as are the sacroiliac joints.
Degenerative changes of the lower lumbosacral spine are noted but
better described on concurrent lumbosacral spine series.

No other focal bony abnormality is noted. No abnormal calcification
is identified about the left hip joint to suggest the presence of
calcific tendonitis or bursitis.
IMPRESSION: Degenerative changes of both hips as described above.

## 2009-05-13 IMAGING — CR DG THORACIC SPINE 2V
3 series · 3 of 3 positions shown · non-contrast
Comparison: Test in [DATE]

CLINICAL DATA: Pain between shoulder blades.  History of breast
cancer

THORACIC SPINE - 2 VIEW

[view not recorded (1 of 3)]
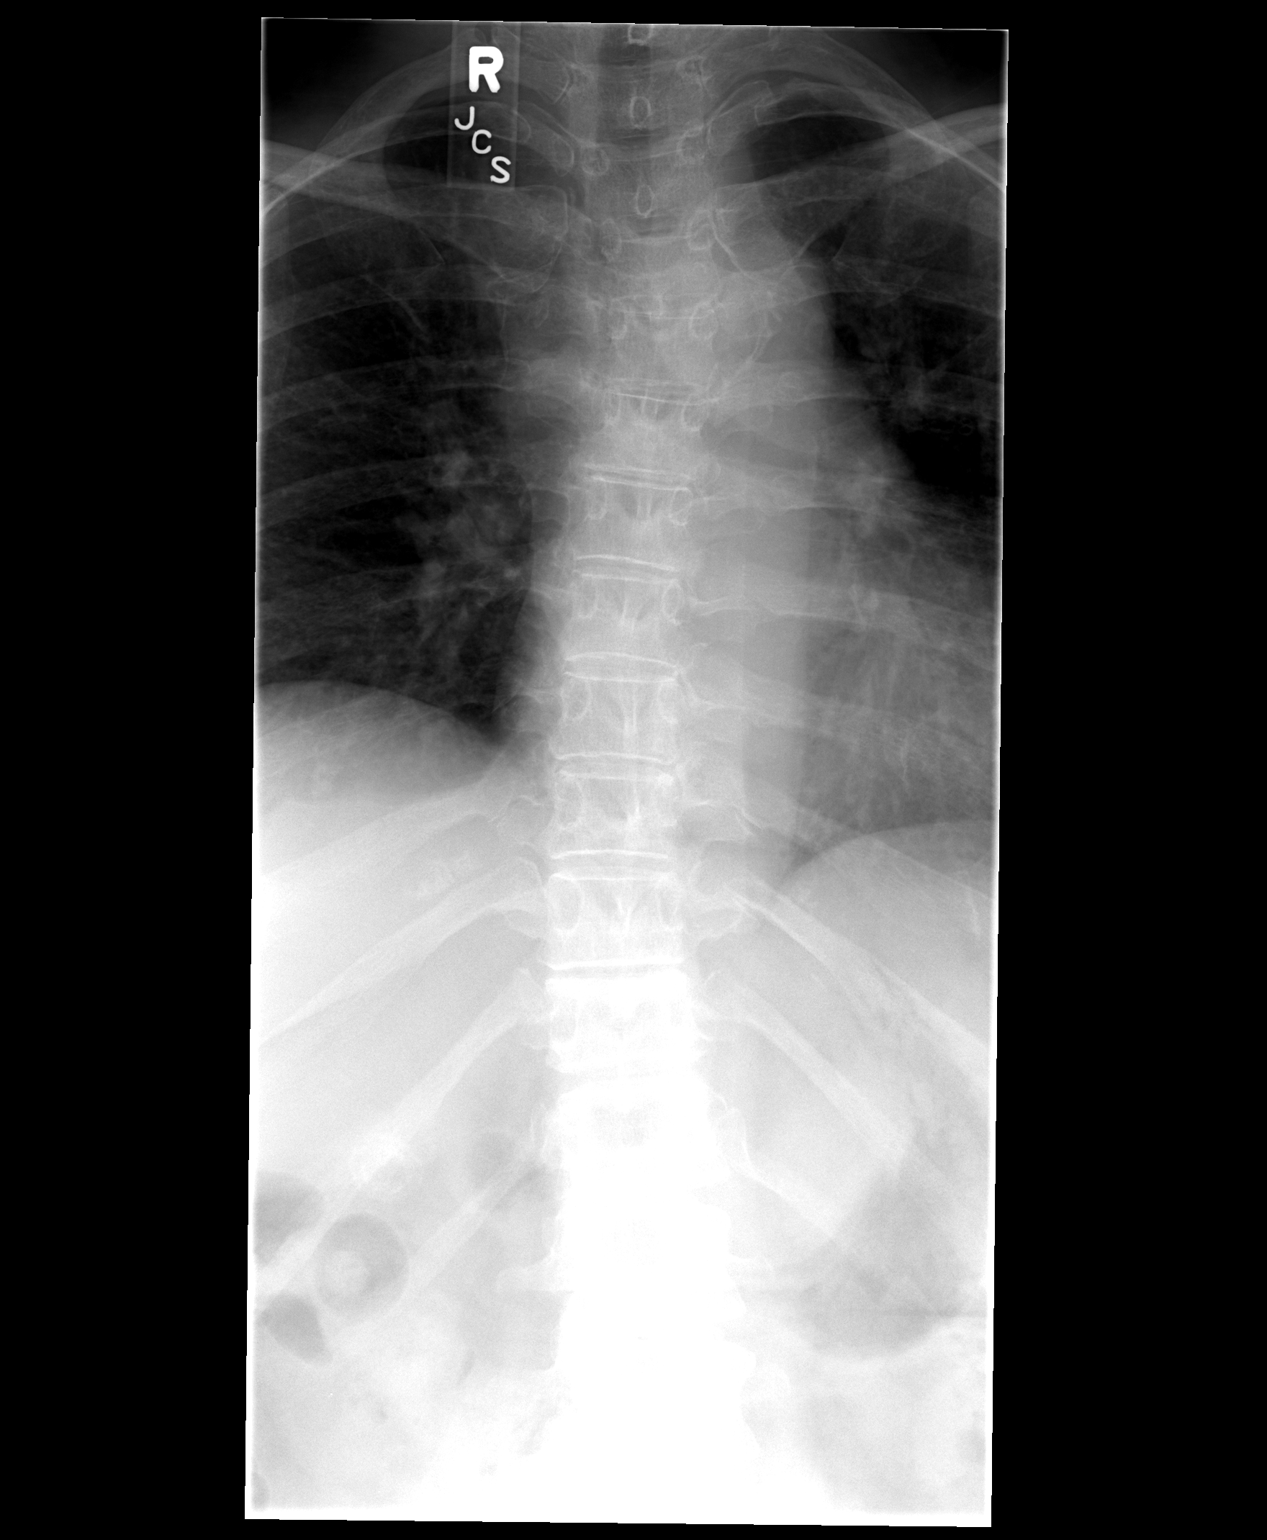

[view not recorded (2 of 3)]
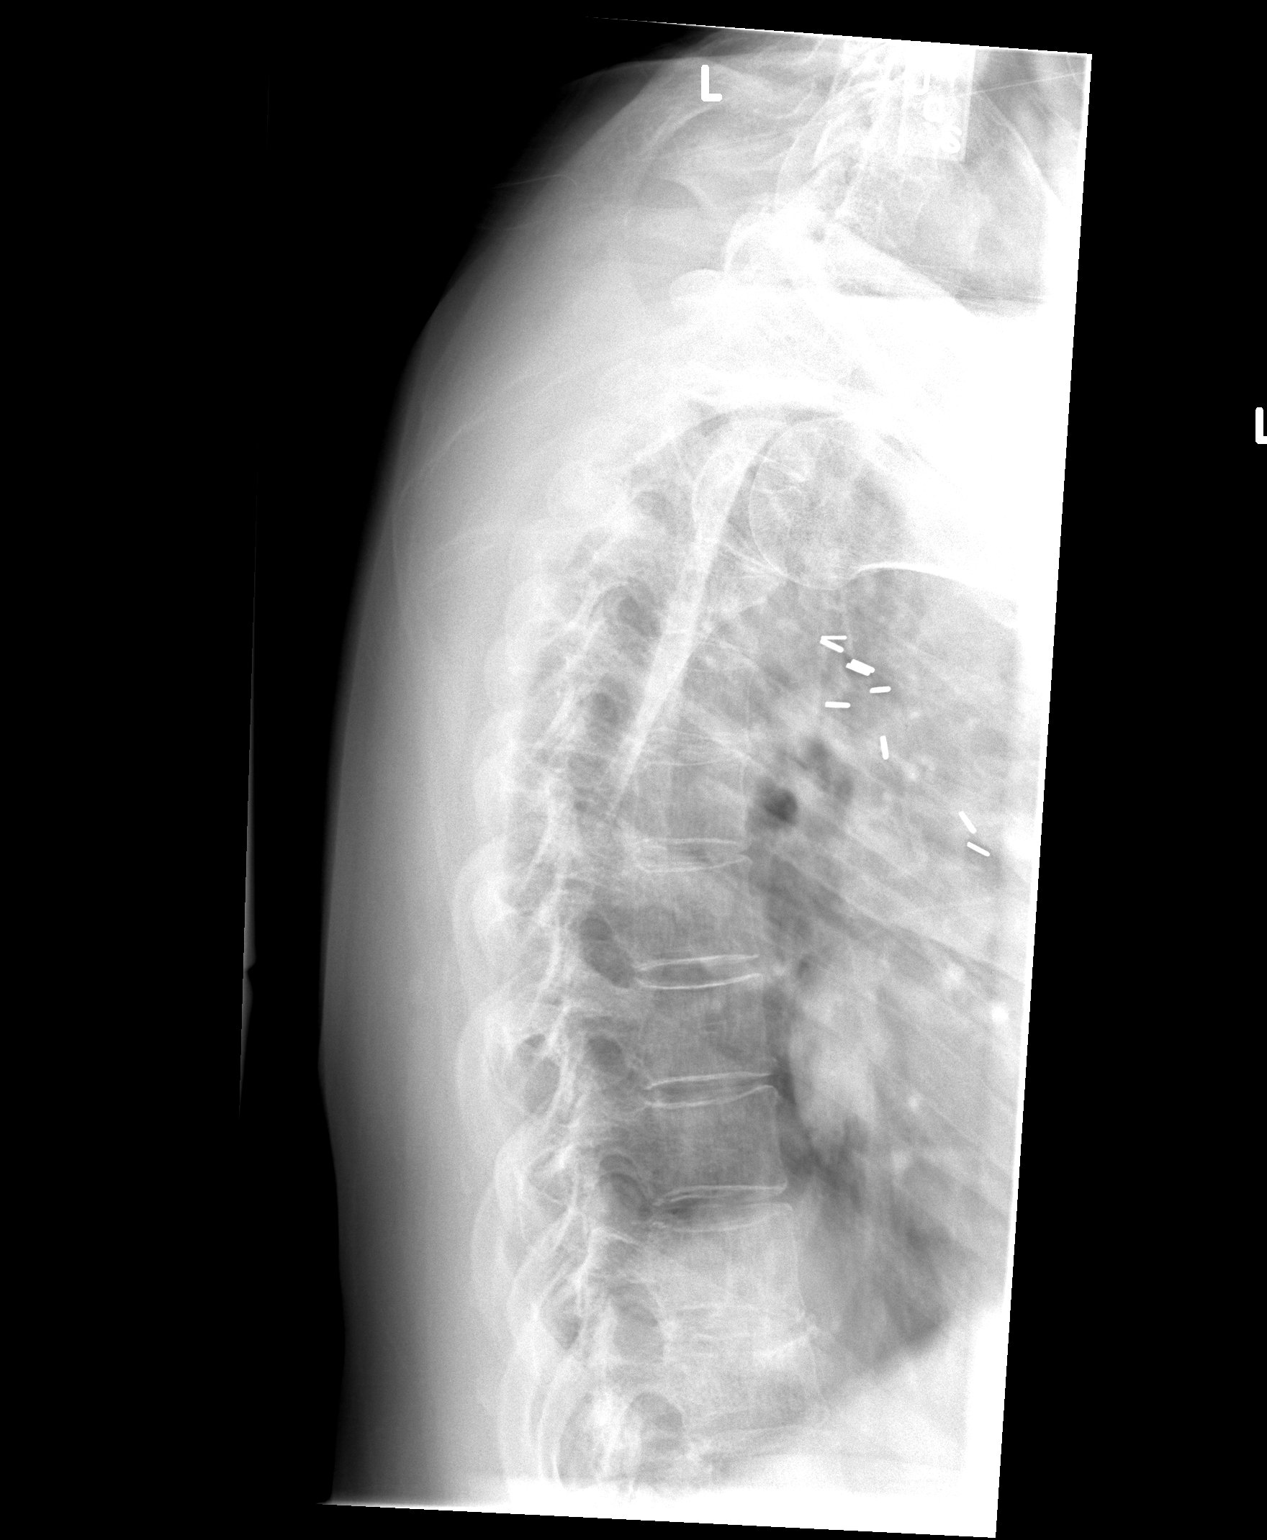

[view not recorded (3 of 3)]
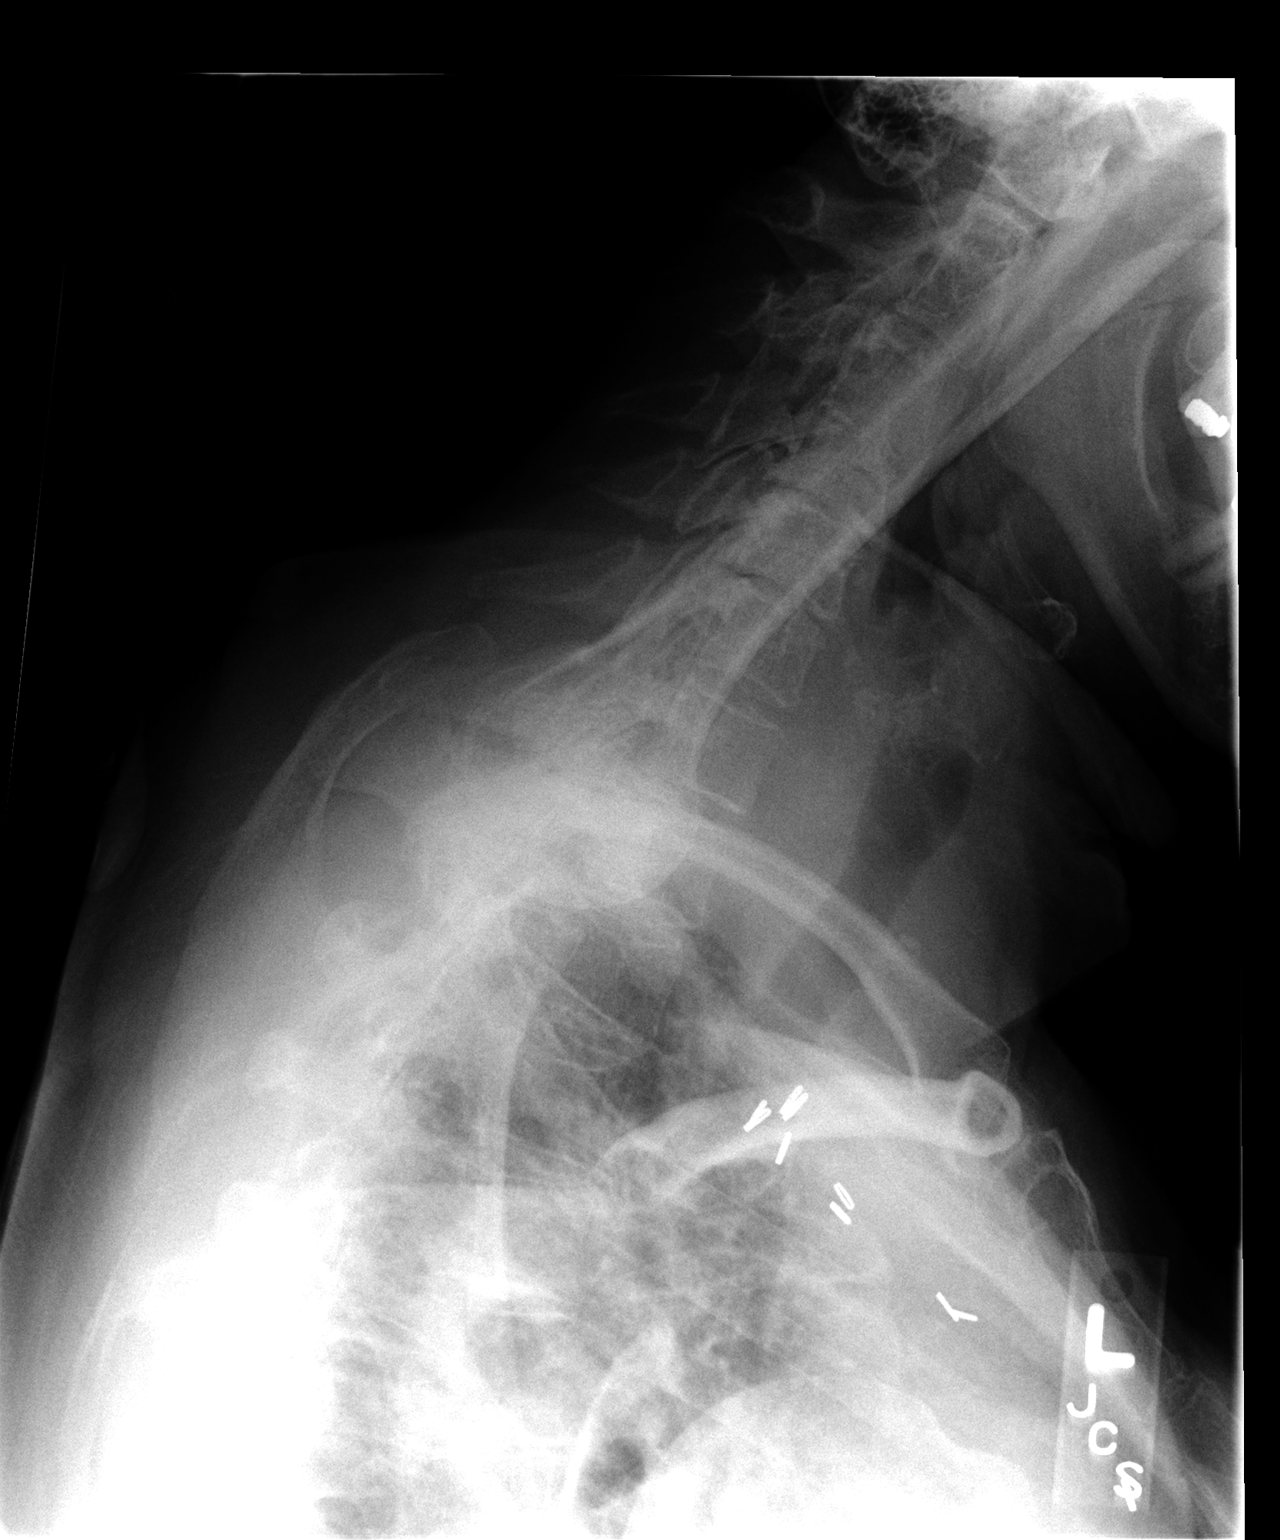

[3 of 3 positions shown; findings below may reference images not displayed]

FINDINGS: Mild curvature of the thoracic spine is identified and is
stable in comparison with prior chest x-ray.  Bone density appears
within normal limits.  Bony alignment is otherwise intact.  No
definite focal or acute bony abnormality is suggested.
IMPRESSION: Stable mild thoracic curvature with no acute abnormality noted.

## 2009-06-22 ENCOUNTER — Ambulatory Visit: Payer: Self-pay | Admitting: Oncology

## 2009-08-26 ENCOUNTER — Ambulatory Visit: Payer: Self-pay | Admitting: Oncology

## 2010-09-09 NOTE — Op Note (Signed)
NAME:  Sarah Underwood, Sarah Underwood                        ACCOUNT NO.:  [id number]   MEDICAL RECORD NO.:  [id number]                   PATIENT TYPE:  AMB   LOCATION:  ENDO                                 FACILITY:  [facility]   PHYSICIAN:  James L. Malon Kindle., M.D.          DATE OF BIRTH:  1946/02/01   DATE OF PROCEDURE:  03/10/2003  DATE OF DISCHARGE:                                 OPERATIVE REPORT   PROCEDURE:  Colonoscopy with biopsy.   MEDICATIONS:  Fentanyl 25 mcg, Versed 7 mg IV.   INDICATIONS:  Diarrhea with change in bowel habits and abdominal pain.   DESCRIPTION OF PROCEDURE:  The procedure had been explained to the patient  and consent obtained.  With the patient in the left lateral decubitus  position, the Olympus scope was inserted and advanced.  We were able to  advance easily to the cecum.  The terminal ileum was entered for a short  distance and was normal.  The scope was withdrawn and the colon carefully  examined upon withdrawal.  The mucosa was normal throughout.  There were no  polyps, no diverticular disease.  Multiple random biopsies were obtained.  The mucosal pattern was normal with a good vascular pattern and no obvious  edema or ulceration.  The scope was withdrawn, and the patient tolerated the  procedure well.   ASSESSMENT:  1. Diarrhea, code 564.5.  2. Rule out mild colitis.   PLAN:  Will check pathology and see back in the office in four to eight  weeks.                                               James L. Malon Kindle., M.D.    Waldron Session  D:  03/10/2003  T:  03/10/2003  Job:  [id number]   cc:   Lovenia Kim, D.O.  [address]  [place]  [place] [address]  Fax: [phone]   Leighton Roach. Truett Perna, M.D.  [address]  [place]  [place] [address]  Fax: [phone]   Andres Ege, M.D.  [address]  [place]  [place] [address]  Fax: [phone]

## 2010-10-25 ENCOUNTER — Encounter (HOSPITAL_BASED_OUTPATIENT_CLINIC_OR_DEPARTMENT_OTHER): Payer: BC Managed Care – PPO | Admitting: Oncology

## 2010-10-25 DIAGNOSIS — Z853 Personal history of malignant neoplasm of breast: Secondary | ICD-10-CM

## 2010-11-28 ENCOUNTER — Telehealth: Payer: Self-pay | Admitting: Family Medicine

## 2010-11-28 NOTE — Telephone Encounter (Signed)
Pt called to say her PCP is leaving and is wondering if she can talk to Dr Sheffield Slider about who he would recommend as a new PCP for her.  She is the daughter of [facility] patient that passed away last year, Neal Dy. She really respects Dr Martin Majestic perspective and would value his opinion. He can also call her after 6:30 pm at her home - [phone]

## 2010-12-09 NOTE — Telephone Encounter (Signed)
I spoke with her. Her Dr, Rosanna Randy is moving out of continuity practice to care for a Down's Child. [name] likes our practice for the continuity care which includes the hospital. I am recommending Dr [place] as a possible physician.

## 2010-12-29 ENCOUNTER — Other Ambulatory Visit: Payer: Self-pay | Admitting: Dermatology

## 2011-05-29 ENCOUNTER — Telehealth: Payer: Self-pay | Admitting: Oncology

## 2011-05-29 NOTE — Telephone Encounter (Signed)
lmonvm adviisng the pt of her July 2013 appt with dr Truett Perna

## 2011-07-21 ENCOUNTER — Ambulatory Visit (INDEPENDENT_AMBULATORY_CARE_PROVIDER_SITE_OTHER): Payer: BC Managed Care – PPO | Admitting: Emergency Medicine

## 2011-07-21 ENCOUNTER — Ambulatory Visit: Payer: BC Managed Care – PPO

## 2011-07-21 VITALS — BP 149/76 | HR 76 | Temp 98.6°F | Resp 16 | Ht 64.0 in | Wt 151.8 lb

## 2011-07-21 DIAGNOSIS — J40 Bronchitis, not specified as acute or chronic: Secondary | ICD-10-CM

## 2011-07-21 DIAGNOSIS — R05 Cough: Secondary | ICD-10-CM

## 2011-07-21 DIAGNOSIS — R059 Cough, unspecified: Secondary | ICD-10-CM

## 2011-07-21 DIAGNOSIS — R509 Fever, unspecified: Secondary | ICD-10-CM

## 2011-07-21 DIAGNOSIS — J9801 Acute bronchospasm: Secondary | ICD-10-CM

## 2011-07-21 LAB — POCT CBC
HCT, POC: 38.6 % (ref 37.7–47.9)
MCH, POC: 28.9 pg (ref 27–31.2)
MPV: 8.1 fL (ref 0–99.8)
POC LYMPH PERCENT: 24.1 %L (ref 10–50)
POC MID %: 7.3 %M (ref 0–12)
Platelet Count, POC: 200 10*3/uL (ref 142–424)
RDW, POC: 14.3 %
WBC: 4.3 10*3/uL — AB (ref 4.6–10.2)

## 2011-07-21 IMAGING — CR DG CHEST 2V
2 series · 2 of 2 positions shown · non-contrast
Comparison: None

CLINICAL DATA: Cough, fever

CHEST - 2 VIEW

[PA]
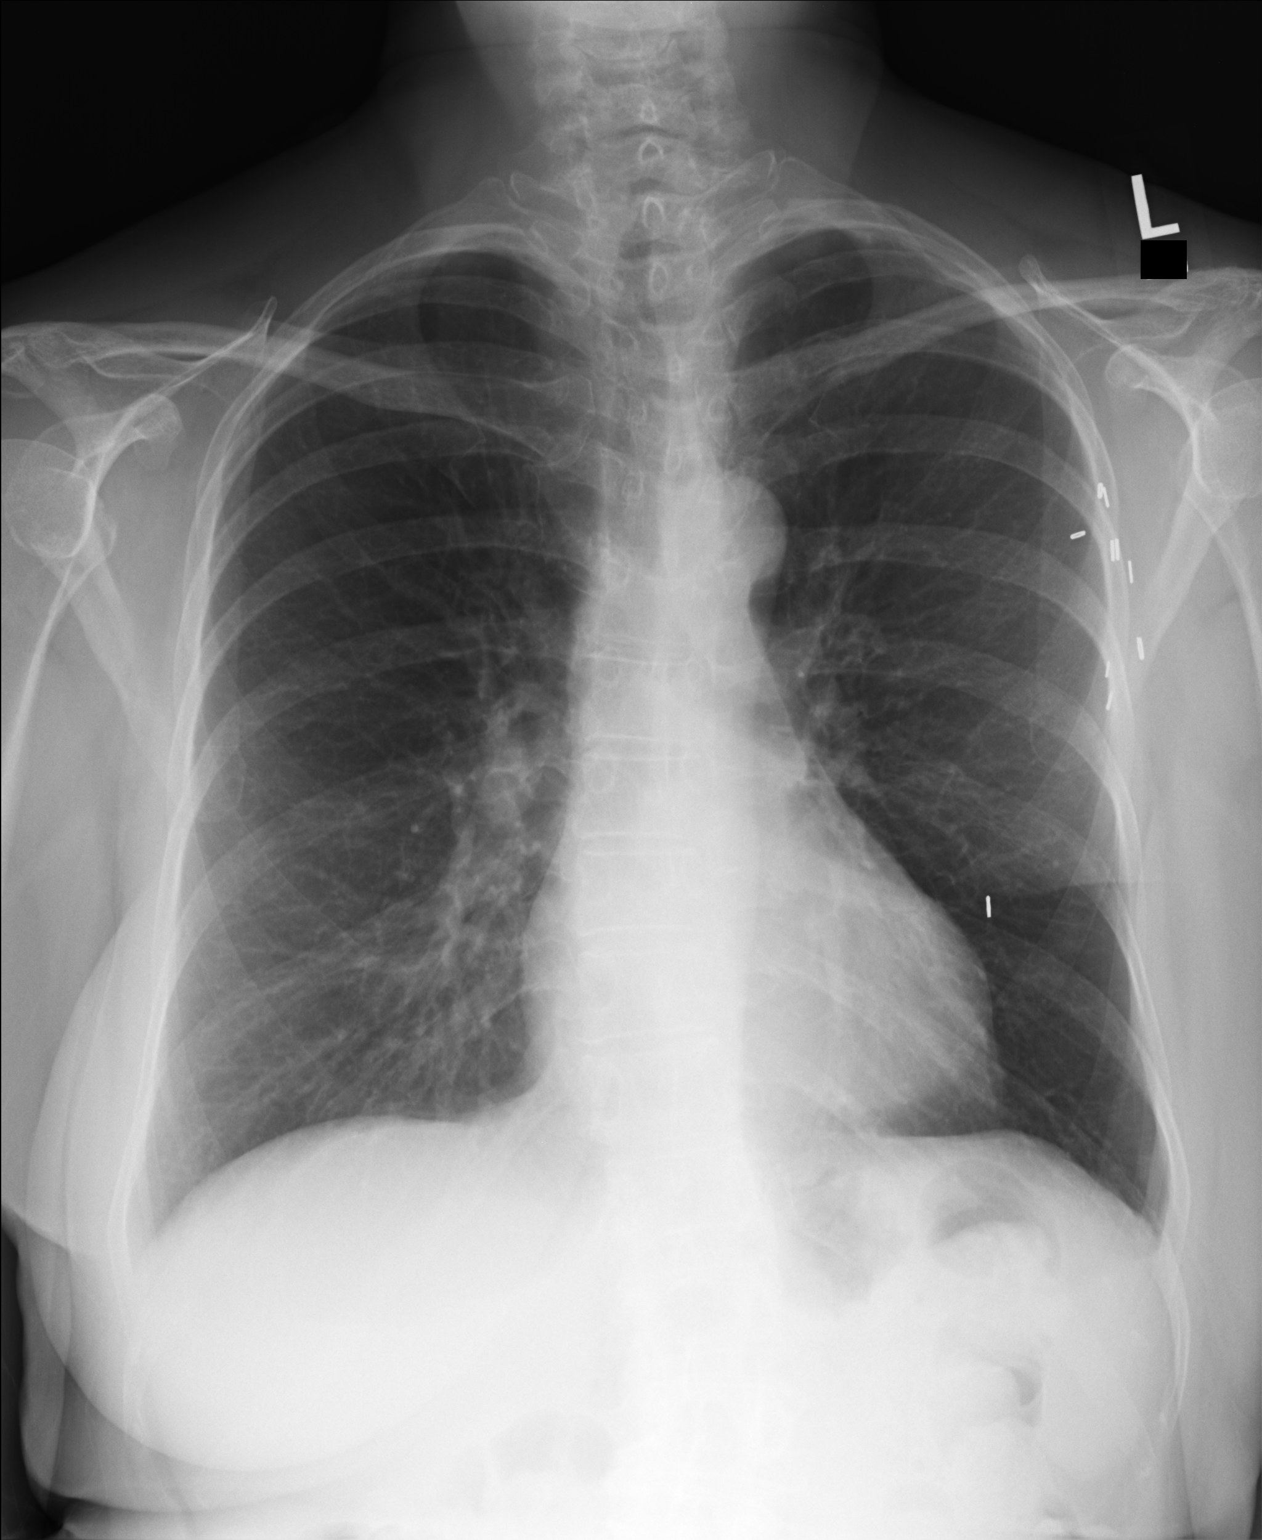

[lateral]
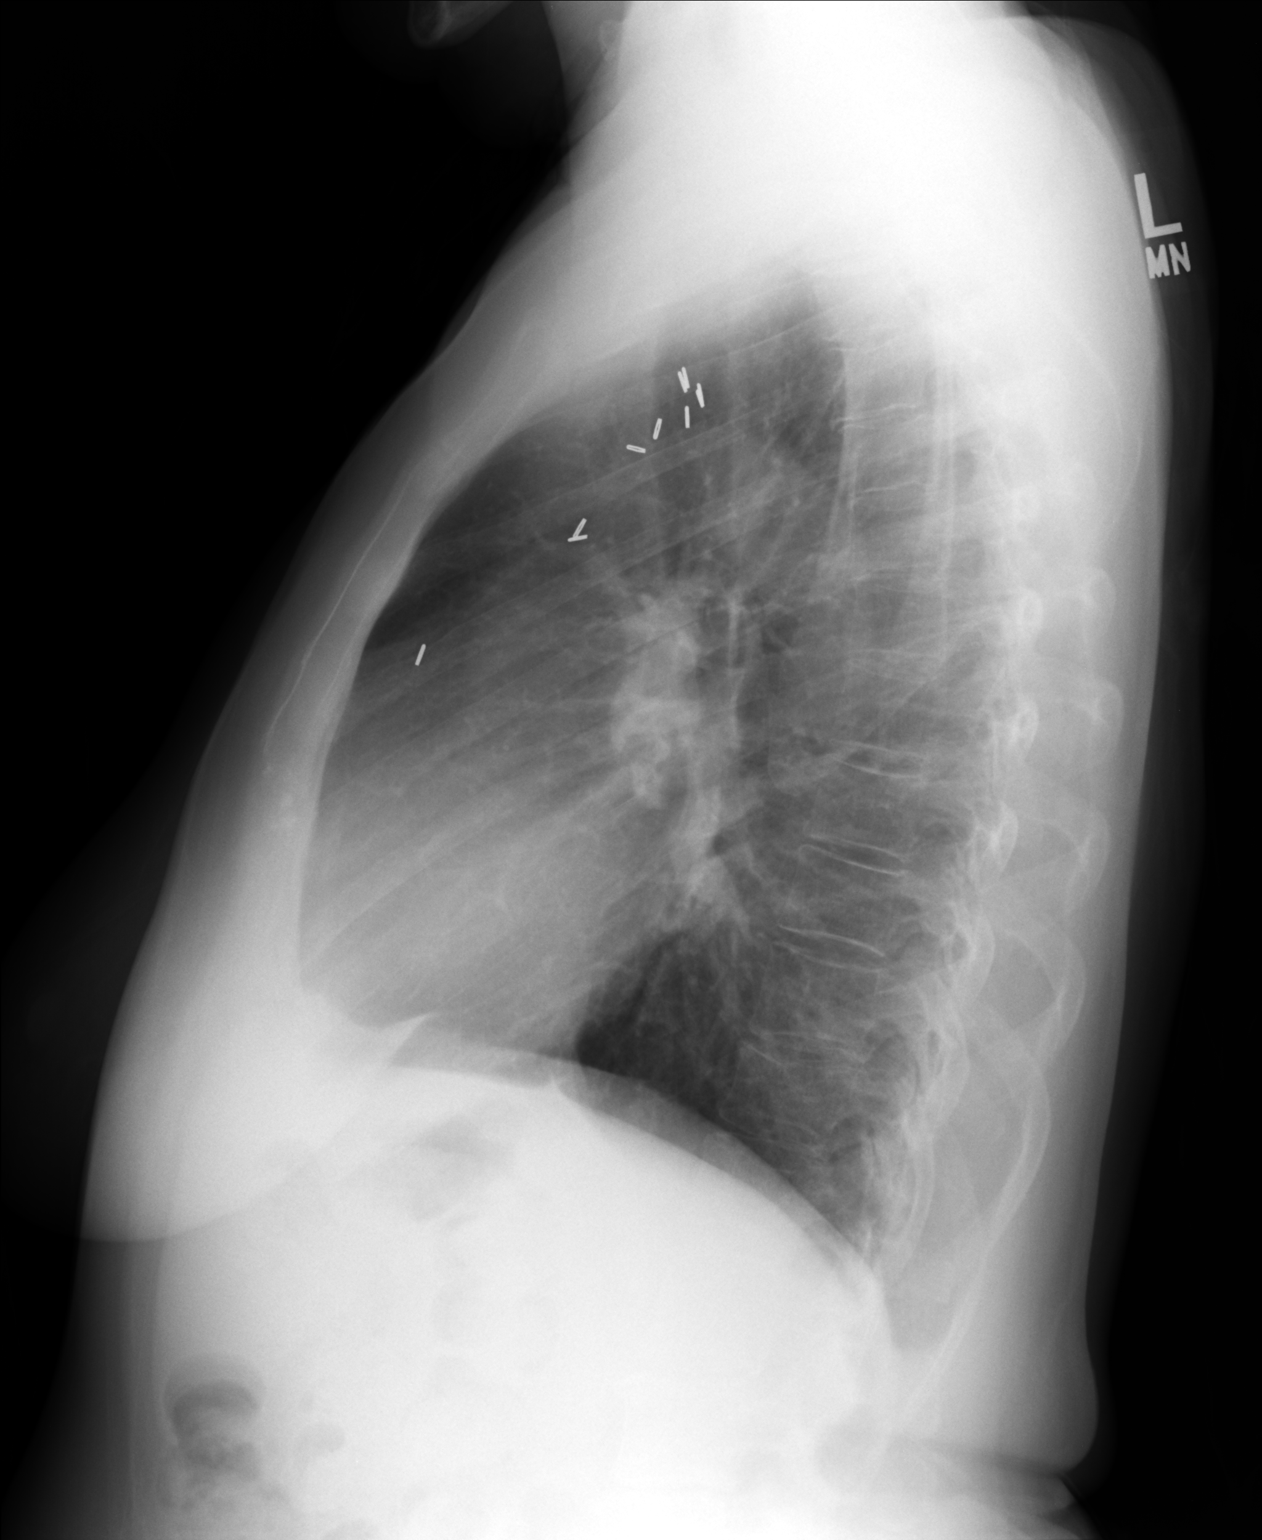

[2 of 2 positions shown; findings below may reference images not displayed]

FINDINGS: Cardiomediastinal silhouette is unremarkable.  No acute
infiltrate or pleural effusion.  No pulmonary edema.  The patient
is status post left mastectomy.  Surgical clips are noted in left
axilla.
IMPRESSION: No active disease.  Status post left mastectomy.  Surgical clips
are noted in the left axilla.

## 2011-07-21 MED ORDER — HYDROCODONE-HOMATROPINE 5-1.5 MG/5ML PO SYRP: 5.0000 mL | ORAL_SOLUTION | ORAL | Status: AC | PRN

## 2011-07-21 MED ORDER — ALBUTEROL SULFATE (2.5 MG/3ML) 0.083% IN NEBU
2.5000 mg | INHALATION_SOLUTION | Freq: Once | RESPIRATORY_TRACT | Status: AC
  Administered 2011-07-21: 2.5 mg via RESPIRATORY_TRACT

## 2011-07-21 MED ORDER — ALBUTEROL SULFATE HFA 108 (90 BASE) MCG/ACT IN AERS: 2.0000 | INHALATION_SPRAY | RESPIRATORY_TRACT | Status: DC | PRN

## 2011-07-21 MED ORDER — DOXYCYCLINE HYCLATE 100 MG PO TABS: 100.0000 mg | ORAL_TABLET | Freq: Two times a day (BID) | ORAL | Status: AC

## 2011-07-21 MED ORDER — IPRATROPIUM BROMIDE 0.06 % NA SOLN: 2.0000 | Freq: Four times a day (QID) | NASAL | Status: DC

## 2011-07-21 NOTE — Progress Notes (Signed)
  Subjective:    Patient ID: Sarah Underwood, female    DOB: Feb 19, 1946, 66 y.o.   MRN: [id number]  Cough Associated symptoms include a fever.  Fever  Associated symptoms include coughing.   Sarah Underwood comes in today with 3 days of nasal congestion,cough, chest tightness, and wheezing.  Worse last night.  Low grade fever and chills. Using OTC meds with some relief.  Similar illness 2 years ago.  Needed inhalers.  She did not get a flu shot this year.  She has not had pneumovax.  Breast CA survivor.  Non smoker.  Past Medical History  Diagnosis Date  . Cancer       Review of Systems  Constitutional: Positive for fever.  Respiratory: Positive for cough.    As above    Objective:   Physical Exam  Constitutional: She is oriented to person, place, and time. She appears well-developed and well-nourished. She does not appear ill.  HENT:  Right Ear: Tympanic membrane normal.  Left Ear: Tympanic membrane normal.  Nose: Mucosal edema present.  Mouth/Throat: Oropharynx is clear and moist and mucous membranes are normal.  Cardiovascular: Normal rate and regular rhythm.   Pulmonary/Chest: Effort normal. She has wheezes in the right lower field and the left lower field. She has rhonchi.  Lymphadenopathy:    She has no cervical adenopathy.  Neurological: She is alert and oriented to person, place, and time.  Skin: Skin is warm.   Albuterol Nebulizer given. PF 250 Improved with neb.  Loose cough PF 300   Results for orders placed in visit on 07/21/11  POCT CBC      Component Value Range   WBC 4.3 (*) 4.6 - 10.2 (K/uL)   Lymph, poc 1.0  0.6 - 3.4    POC LYMPH PERCENT 24.1  10 - 50 (%L)   MID (cbc) 0.3  0 - 0.9    POC MID % 7.3  0 - 12 (%M)   POC Granulocyte 2.9  2 - 6.9    Granulocyte percent 68.6  37 - 80 (%G)   RBC 4.40  4.04 - 5.48 (M/uL)   Hemoglobin 12.7  12.2 - 16.2 (g/dL)   HCT, POC 40.9  81.1 - 47.9 (%)   MCV 87.8  80 - 97 (fL)   MCH, POC 28.9  27 - 31.2 (pg)   MCHC  32.9  31.8 - 35.4 (g/dL)   RDW, POC 91.4     Platelet Count, POC 200  142 - 424 (K/uL)   MPV 8.1  0 - 99.8 (fL)   [facility] reading (PRIMARY) by  Dr. .  CXR  Increased markings.  Clips left side s/p mastectomy    Assessment & Plan:  Cough Bronchospasm Viral Illness H/O Breast CA  Albuterol Inhaler, Atrovent NS, Hydromet (sedation warning), Doxycycline that she will hold for now.  Push fluids. Watch for worsening symptoms. Discussed case with Dr. Cleta Alberts.

## 2011-07-21 NOTE — Patient Instructions (Signed)
Push fluids. Take tylenol or advil for achiness and fever. Watch for increased shortness of breath or wheezing. Stop Afrin. Follow up with Dr. Cleta Alberts if worse.

## 2011-11-03 ENCOUNTER — Ambulatory Visit: Payer: BC Managed Care – PPO | Admitting: Oncology

## 2011-11-06 ENCOUNTER — Telehealth: Payer: Self-pay | Admitting: *Deleted

## 2011-11-06 NOTE — Telephone Encounter (Signed)
pt had called and l/m to r/s appt,r/s to 7/19 aom

## 2011-11-10 ENCOUNTER — Encounter: Payer: Self-pay | Admitting: Oncology

## 2011-11-10 ENCOUNTER — Ambulatory Visit (HOSPITAL_BASED_OUTPATIENT_CLINIC_OR_DEPARTMENT_OTHER): Payer: BC Managed Care – PPO | Admitting: Nurse Practitioner

## 2011-11-10 ENCOUNTER — Encounter (HOSPITAL_BASED_OUTPATIENT_CLINIC_OR_DEPARTMENT_OTHER): Payer: BC Managed Care – PPO | Admitting: Oncology

## 2011-11-10 DIAGNOSIS — C50919 Malignant neoplasm of unspecified site of unspecified female breast: Secondary | ICD-10-CM

## 2011-11-10 NOTE — Progress Notes (Signed)
OFFICE PROGRESS NOTE  Interval history:  [name] returns as scheduled. She continues Arimidex. She reports arthralgias and periodic hot flashes both of which are tolerable. No new sites of pain. She has a good appetite and overall good energy level. No cough or shortness of breath. Bowels moving regularly. No change over the chest wall. She notes periodic episodes of an increased heart rate. She reports cardiology evaluation in the past.  She reports taking calcium and vitamin D.  Objective: Temperature 97.6, heart rate 79, blood pressure 126/76, weight 164.2 pounds  Oropharynx is without thrush or ulceration. No palpable cervical, supraclavicular or axillary lymph nodes. Status post left mastectomy. Radiation telangiectasias over the left anterior chest wall and left axilla. No chest wall nodules. Right breast without palpable mass. Lungs are clear. No wheezes or rales. Regular cardiac rhythm. Abdomen soft and nontender. No hepatomegaly. Extremities are without edema.  Lab Results: Lab Results  Component Value Date   WBC 4.3* 07/21/2011   HGB 12.7 07/21/2011   HCT 38.6 07/21/2011   MCV 87.8 07/21/2011    Chemistry:    Chemistry   No results found for this basename: NA, K, CL, CO2, BUN, CREATININE, GLU   No results found for this basename: CALCIUM, ALKPHOS, AST, ALT, BILITOT       Studies/Results: No results found.  Medications: I have reviewed the patient's current medications.  Assessment/Plan:  1. History of left-sided breast cancer dating to 1995; chest wall recurrence in October 2001. She continues Arimidex.  Disposition-[name] remains in clinical remission from the breast cancer. She is scheduled for a right mammogram in August of this year. She also plans to schedule a bone density study. She will return for a followup visit in one year. She will contact the office in the interim with any problems.  Patient seen with Dr. Truett Perna.  Lonna Cobb ANP/GNP-BC

## 2011-11-13 ENCOUNTER — Telehealth: Payer: Self-pay | Admitting: *Deleted

## 2011-11-13 NOTE — Telephone Encounter (Signed)
Gave patient appointment for 2014 

## 2011-11-19 ENCOUNTER — Other Ambulatory Visit: Payer: Self-pay | Admitting: Oncology

## 2011-11-19 DIAGNOSIS — C50919 Malignant neoplasm of unspecified site of unspecified female breast: Secondary | ICD-10-CM

## 2011-12-19 ENCOUNTER — Telehealth: Payer: Self-pay | Admitting: *Deleted

## 2011-12-19 NOTE — Telephone Encounter (Signed)
Left message on voicemail for pt to call office. Dr. Truett Perna recommends pt take Calcium +D.

## 2012-01-02 ENCOUNTER — Telehealth: Payer: Self-pay | Admitting: *Deleted

## 2012-01-02 NOTE — Telephone Encounter (Signed)
Left message on voicemail requesting pt call office. (Dr. Truett Perna wants to be sure she is taking Calcium and Vit D.)

## 2012-01-11 ENCOUNTER — Telehealth: Payer: Self-pay | Admitting: *Deleted

## 2012-01-11 MED ORDER — CALCIUM GLUCONATE 500 MG PO TABS: 500.0000 mg | ORAL_TABLET | Freq: Every day | ORAL | Status: DC

## 2012-01-11 NOTE — Telephone Encounter (Signed)
Patient given results on mammogram and bone density testing. She is taking calcium 500 mg daily and vitamin d3 6000 units daily. Will mail copy of reports to her home as requested.

## 2012-03-12 HISTORY — PX: COLPOSCOPY W/ BIOPSY / CURETTAGE: SUR283

## 2012-10-22 NOTE — Progress Notes (Signed)
This encounter was created in error - please disregard.

## 2012-11-07 ENCOUNTER — Ambulatory Visit (HOSPITAL_BASED_OUTPATIENT_CLINIC_OR_DEPARTMENT_OTHER): Payer: BC Managed Care – PPO | Admitting: Oncology

## 2012-11-07 ENCOUNTER — Other Ambulatory Visit: Payer: BC Managed Care – PPO | Admitting: Lab

## 2012-11-07 ENCOUNTER — Telehealth: Payer: Self-pay | Admitting: Oncology

## 2012-11-07 VITALS — BP 142/60 | HR 67 | Temp 98.5°F | Resp 20 | Ht 64.0 in | Wt 168.6 lb

## 2012-11-07 DIAGNOSIS — C50912 Malignant neoplasm of unspecified site of left female breast: Secondary | ICD-10-CM

## 2012-11-07 DIAGNOSIS — C50919 Malignant neoplasm of unspecified site of unspecified female breast: Secondary | ICD-10-CM

## 2012-11-07 NOTE — Progress Notes (Signed)
   [facility]    OFFICE PROGRESS NOTE   INTERVAL HISTORY:   She continues the Arimidex. She has hot flashes, chiefly in the evening. Stable chronic arthralgias and low back pain. She relates the pain to "arthritis ". She is exercising regularly and undergoes acupuncture. She last had a right mammogram on 12/01/2011. This was a stable mammogram.  Objective:  Vital signs in last 24 hours:  Blood pressure 142/60, pulse 67, temperature 98.5 F (36.9 C), temperature source Oral, resp. rate 20, height 5\' 4"  (1.626 m), weight 168 lb 9.6 oz (76.476 kg).    HEENT: Neck without mass Lymphatics: No cervical, supraclavicular, or axillary nodes Resp: Lungs clear bilaterally Cardio: Regular rate and rhythm GI: No hepatomegaly Vascular: No leg edema Breasts: Status left mastectomy. No chest wall nodules. Right breast without mass. Skin: Radiation telangiectasias over the left anterior chest     Medications: I have reviewed the patient's current medications.  Assessment/Plan:  Sarah Underwood has a history of left-sided breast cancer dating to 42. She was diagnosed with a chest wall recurrence in October of 2001. She remains on indefinite Arimidex. She remains in clinical remission.  She will schedule a right mammogram for August of 2014. Sarah Underwood will return for an office visit in one year.  She has a history of osteopenia. She last had a bone density scan in August of 2013. She is exercising and takes calcium/vitamin D. A bone density scan will be planned for 2015.    Thornton Papas, MD  11/07/2012  5:01 PM

## 2012-11-07 NOTE — Telephone Encounter (Signed)
gv and printed appt sched and avs  °

## 2012-12-29 ENCOUNTER — Other Ambulatory Visit: Payer: Self-pay | Admitting: Oncology

## 2013-02-05 ENCOUNTER — Ambulatory Visit: Payer: Medicare Other

## 2013-02-05 ENCOUNTER — Other Ambulatory Visit: Payer: Self-pay | Admitting: Cardiovascular Disease

## 2013-02-05 DIAGNOSIS — R0602 Shortness of breath: Secondary | ICD-10-CM

## 2013-02-17 ENCOUNTER — Telehealth: Payer: Self-pay | Admitting: Oncology

## 2013-02-17 NOTE — Telephone Encounter (Signed)
Faxed pt medical records to Dr. Holwerda °

## 2013-02-21 ENCOUNTER — Ambulatory Visit (INDEPENDENT_AMBULATORY_CARE_PROVIDER_SITE_OTHER): Payer: Medicare Other | Admitting: Nurse Practitioner

## 2013-02-21 ENCOUNTER — Encounter: Payer: Self-pay | Admitting: Nurse Practitioner

## 2013-02-21 VITALS — BP 130/74 | HR 64 | Resp 16 | Ht 63.75 in | Wt 166.0 lb

## 2013-02-21 DIAGNOSIS — Z124 Encounter for screening for malignant neoplasm of cervix: Secondary | ICD-10-CM

## 2013-02-21 DIAGNOSIS — C50912 Malignant neoplasm of unspecified site of left female breast: Secondary | ICD-10-CM

## 2013-02-21 DIAGNOSIS — C50919 Malignant neoplasm of unspecified site of unspecified female breast: Secondary | ICD-10-CM

## 2013-02-21 DIAGNOSIS — N952 Postmenopausal atrophic vaginitis: Secondary | ICD-10-CM

## 2013-02-21 DIAGNOSIS — Z0000 Encounter for general adult medical examination without abnormal findings: Secondary | ICD-10-CM

## 2013-02-21 DIAGNOSIS — IMO0002 Reserved for concepts with insufficient information to code with codable children: Secondary | ICD-10-CM

## 2013-02-21 DIAGNOSIS — R6889 Other general symptoms and signs: Secondary | ICD-10-CM

## 2013-02-21 DIAGNOSIS — Z01419 Encounter for gynecological examination (general) (routine) without abnormal findings: Secondary | ICD-10-CM

## 2013-02-21 NOTE — Patient Instructions (Signed)

## 2013-02-21 NOTE — Progress Notes (Signed)
Patient ID: Sarah Underwood, female   DOB: [birth year], 67 y.o.   MRN: [id number] 67 y.o. G1P1001 Single Caucasian Fe here for annual exam.  Now retired. Feeling well and vaso symptoms are tolerable.  No LMP recorded. Patient is postmenopausal.          Sexually active: no  The current method of family planning is abstinence.    Exercising: yes  Gym/ health club routine includes light weights and walking.  sees trainer at gym once per week. Smoker:  no  Health Maintenance: Pap:  02/20/12, WNL, pos HR HPV, + 16/ negative 18 MMG:  8/12/214 with 3 D; Bi-Rads 1: negative Colonoscopy:  03/25/10, int hemorrhoids, repeat 5 years BMD:   12/01/11, osteopenia TDaP:  2011 Labs: PCP   reports that she has never smoked. She has never used smokeless tobacco. She reports that she does not drink alcohol or use illicit drugs.  Past Medical History  Diagnosis Date  . Graves disease 2007    thyroid nodule  . Breast cancer 1995 & 2001    left  . Basal cell carcinoma 1994    right side of node  . Ovarian mass     small solid nodule 8x59mm: Right ovary - stable since 2000    Past Surgical History  Procedure Laterality Date  . Tonsillectomy and adenoidectomy  1950  . Mastectomy Left 3/95    Stage IIb 0/14 LN ER/ PR + with mastectomy - Dr. Mardella Layman  . Breast biopsy Right 1987    benign fibroadenoma  . Colposcopy w/ biopsy / curettage  03/12/12    benign, but + HR HPV  . Breast cancer recurrence Left 01/2000    at site of left chest wall after previous mastectomy with removal and treatment with radiaton.    Current Outpatient Prescriptions  Medication Sig Dispense Refill  . albuterol (PROVENTIL HFA;VENTOLIN HFA) 108 (90 BASE) MCG/ACT inhaler Inhale 2 puffs into the lungs every 4 (four) hours as needed for wheezing.  1 Inhaler  0  . anastrozole (ARIMIDEX) 1 MG tablet TAKE 1 TABLET DAILY  90 tablet  3  . calcium gluconate 500 MG tablet Take 1 tablet (500 mg total) by mouth daily.      . Cholecalciferol  (VITAMIN D-3 PO) Take 6,000 Units by mouth daily.      . Efinaconazole (JUBLIA) 10 % SOLN Apply topically.      . Multiple Vitamins-Minerals (MULTIVITAL PO) Take 1 tablet by mouth daily.       No current facility-administered medications for this visit.    Family History  Problem Relation Age of Onset  . Breast cancer Mother 29  . Osteoporosis Mother   . Hypertension Mother   . Hypertension Father   . Heart attack Father   . Breast cancer Sister 34  . Diabetes Brother   . Breast cancer Cousin     ROS:  Pertinent items are noted in HPI.  Otherwise, a comprehensive ROS was negative.  Exam:   BP 130/74  Pulse 64  Resp 16  Ht 5' 3.75" (1.619 m)  Wt 166 lb (75.297 kg)  BMI 28.73 kg/m2 Height: 5' 3.75" (161.9 cm)  Ht Readings from Last 3 Encounters:  02/21/13 5' 3.75" (1.619 m)  11/07/12 5\' 4"  (1.626 m)  11/10/11 5\' 4"  (1.626 m)    General appearance: alert, cooperative and appears stated age Head: Normocephalic, without obvious abnormality, atraumatic Neck: no adenopathy, supple, symmetrical, trachea midline and thyroid normal to inspection and palpation Lungs:  clear to auscultation bilaterally Breasts:Right -  normal appearance, no masses or tenderness, positive findings: left masectomy with surgical and radiation changes Heart: regular rate and rhythm Abdomen: soft, non-tender; no masses,  no organomegaly Extremities: extremities normal, atraumatic, no cyanosis or edema Skin: Skin color, texture, turgor normal. No rashes or lesions Lymph nodes: Cervical, supraclavicular, and axillary nodes normal. No abnormal inguinal nodes palpated Neurologic: Grossly normal   Pelvic: External genitalia:  no lesions              Urethra:  atrophic appearing urethra with no masses, tenderness or lesions              Bartholin's and Skene's: normal                 Vagina: very atrophic appearing vagina with pale color and discharge, no lesions              Cervix: anteverted               Pap taken: yes Bimanual Exam:  Uterus:  normal size, contour, position, consistency, mobility, non-tender              Adnexa: no mass, fullness, tenderness               Rectovaginal: Confirms               Anus:  normal sphincter tone, no lesions  A:  Well Woman with normal exam  Postmenopausal  S/P left breast cancer 3/95 & 04/2000 on Arimidex  History of  Abnormal pap with + # 16 HR HPV and normal Colpo biopsy 11/13  Atrophic vaginitis with dyspareunia  P:   Pap smear as per guidelines   Mammogram due 8/15  Will follow with pap repeat  Discussed vaginal estrogen replacement with use of Vagifem 1-2 times per week after loading dose if OK with Oncologist as long as she is on Arimidex.  In the past Dr. Truett Perna did not respond to note.   Will send e-mail to Dr. Darnelle Catalan her current oncologist.  Counseled on breast self exam, adequate intake of calcium and vitamin D, diet and exercise return annually or prn  An After Visit Summary was printed and given to the patient.

## 2013-02-23 ENCOUNTER — Encounter: Payer: Self-pay | Admitting: Nurse Practitioner

## 2013-02-23 DIAGNOSIS — N952 Postmenopausal atrophic vaginitis: Secondary | ICD-10-CM | POA: Insufficient documentation

## 2013-02-23 NOTE — Progress Notes (Signed)
Encounter reviewed by Dr. Brook Silva.  

## 2013-02-24 ENCOUNTER — Other Ambulatory Visit: Payer: Self-pay | Admitting: Oncology

## 2013-02-25 LAB — IPS PAP TEST WITH HPV

## 2013-04-29 ENCOUNTER — Other Ambulatory Visit: Payer: Self-pay | Admitting: Orthopedic Surgery

## 2013-05-05 ENCOUNTER — Encounter (HOSPITAL_BASED_OUTPATIENT_CLINIC_OR_DEPARTMENT_OTHER): Payer: Self-pay | Admitting: *Deleted

## 2013-05-05 NOTE — Progress Notes (Signed)
No labs needed

## 2013-05-07 NOTE — H&P (Signed)
  Sarah Underwood is an 68 y.o. female.   Chief Complaint: c/o chronic and progressive numbness and tingling of the right hand HPI: [name] returns for follow up evaluation of her hands. She now has painful right wrist STT arthrosis and increasing carpal tunnel symptoms bilaterally.In 2012 we confirmed the presence of carpal tunnel syndrome which initially responded to steroid injection and splinting. She now has an osteophyte at her right thumb IP joint and increasing pain at her distal wrist flexion crease.   Past Medical History  Diagnosis Date  . Graves disease 2007    thyroid nodule  . Ovarian mass     small solid nodule 8x2mm: Right ovary - stable since 2000  . Breast cancer 1995 & 2001    left  . Basal cell carcinoma 1994    right side of node  . Complication of anesthesia     slow waking up  . Seasonal allergies     Past Surgical History  Procedure Laterality Date  . Tonsillectomy and adenoidectomy  1950  . Mastectomy Left 3/95    Stage IIb 0/14 LN ER/ PR + with mastectomy - Dr. Mendel Ryder  . Breast biopsy Right 1987    benign fibroadenoma  . Colposcopy w/ biopsy / curettage  03/12/12    benign, but + HR HPV  . Breast cancer recurrence Left 01/2000    at site of left chest wall after previous mastectomy with removal and treatment with radiaton.  . Ovary surgery  1974    lt    Family History  Problem Relation Age of Onset  . Breast cancer Mother 29  . Osteoporosis Mother   . Hypertension Mother   . Hypertension Father   . Heart attack Father   . Breast cancer Sister 12  . Diabetes Brother   . Breast cancer Cousin    Social History:  reports that she has never smoked. She has never used smokeless tobacco. She reports that she does not drink alcohol or use illicit drugs.  Allergies:  Allergies  Allergen Reactions  . Erythromycin Nausea And Vomiting  . Penicillins Hives  . Sulfa Antibiotics Rash    No prescriptions prior to admission    No results  found for this or any previous visit (from the past 48 hour(s)).  No results found.   Pertinent items are noted in HPI.  Height 5\' 4"  (1.626 m), weight 74.844 kg (165 lb).  General appearance: alert Head: Normocephalic, without obvious abnormality Neck: supple, symmetrical, trachea midline Resp: clear to auscultation bilaterally Cardio: regular rate and rhythm GI: normal findings: bowel sounds normal Extremities:  Her exam reveals signs of STT arthrosis, persistent carpal tunnel syndrome and generalized osteoarthritis of her small joints.  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  Pulses: 2+ and symmetric Skin: normal Neurologic: Grossly normal    Assessment/Plan  Impression: Right CTS  Plan: TO the OR for right CTR.The procedure, risks,benefits and post-op course were discussed with the patient at length and they were in agreement with the plan.  DASNOIT,Takeira Yanes J 05/07/2013, 12:50 PM   H&P documentation: 05/08/2013  -History and Physical Reviewed  -Patient has been re-examined  -No change in the plan of care  Cammie Sickle, MD

## 2013-05-08 ENCOUNTER — Ambulatory Visit (HOSPITAL_BASED_OUTPATIENT_CLINIC_OR_DEPARTMENT_OTHER): Payer: Medicare Other | Admitting: Anesthesiology

## 2013-05-08 ENCOUNTER — Encounter (HOSPITAL_BASED_OUTPATIENT_CLINIC_OR_DEPARTMENT_OTHER): Payer: Self-pay | Admitting: *Deleted

## 2013-05-08 ENCOUNTER — Encounter (HOSPITAL_BASED_OUTPATIENT_CLINIC_OR_DEPARTMENT_OTHER)
Admission: RE | Disposition: A | Discharge status: Home / Self Care | Payer: Self-pay | Source: Ambulatory Visit | Attending: Orthopedic Surgery

## 2013-05-08 ENCOUNTER — Encounter (HOSPITAL_BASED_OUTPATIENT_CLINIC_OR_DEPARTMENT_OTHER): Payer: Medicare Other | Admitting: Anesthesiology

## 2013-05-08 ENCOUNTER — Ambulatory Visit (HOSPITAL_BASED_OUTPATIENT_CLINIC_OR_DEPARTMENT_OTHER)
Admission: RE | Admit: 2013-05-08 | Discharge: 2013-05-08 | Disposition: A | Discharge status: Home / Self Care | Payer: Medicare Other | Source: Ambulatory Visit | Attending: Orthopedic Surgery | Admitting: Orthopedic Surgery

## 2013-05-08 DIAGNOSIS — K219 Gastro-esophageal reflux disease without esophagitis: Secondary | ICD-10-CM | POA: Insufficient documentation

## 2013-05-08 DIAGNOSIS — Z923 Personal history of irradiation: Secondary | ICD-10-CM | POA: Insufficient documentation

## 2013-05-08 DIAGNOSIS — G5600 Carpal tunnel syndrome, unspecified upper limb: Secondary | ICD-10-CM | POA: Insufficient documentation

## 2013-05-08 DIAGNOSIS — J45909 Unspecified asthma, uncomplicated: Secondary | ICD-10-CM | POA: Insufficient documentation

## 2013-05-08 DIAGNOSIS — Z9010 Acquired absence of unspecified breast and nipple: Secondary | ICD-10-CM | POA: Insufficient documentation

## 2013-05-08 DIAGNOSIS — Z853 Personal history of malignant neoplasm of breast: Secondary | ICD-10-CM | POA: Insufficient documentation

## 2013-05-08 HISTORY — DX: Adverse effect of unspecified anesthetic, initial encounter: T41.45XA

## 2013-05-08 HISTORY — PX: CARPAL TUNNEL RELEASE: SHX101

## 2013-05-08 HISTORY — DX: Other seasonal allergic rhinitis: J30.2

## 2013-05-08 LAB — POCT HEMOGLOBIN-HEMACUE: Hemoglobin: 11.7 g/dL — ABNORMAL LOW (ref 12.0–15.0)

## 2013-05-08 SURGERY — CARPAL TUNNEL RELEASE
Anesthesia: General | Site: Hand | Laterality: Right

## 2013-05-08 MED ORDER — MIDAZOLAM HCL 2 MG/2ML IJ SOLN: 1.0000 mg | INTRAMUSCULAR | Status: DC | PRN

## 2013-05-08 MED ORDER — PROPOFOL 10 MG/ML IV BOLUS
INTRAVENOUS | Status: DC | PRN
  Administered 2013-05-08: 200 mg via INTRAVENOUS

## 2013-05-08 MED ORDER — OXYCODONE HCL 5 MG PO TABS: 5.0000 mg | ORAL_TABLET | Freq: Once | ORAL | Status: DC | PRN

## 2013-05-08 MED ORDER — MIDAZOLAM HCL 2 MG/2ML IJ SOLN
INTRAMUSCULAR | Status: AC
  Filled 2013-05-08: qty 2

## 2013-05-08 MED ORDER — FENTANYL CITRATE 0.05 MG/ML IJ SOLN: 25.0000 ug | INTRAMUSCULAR | Status: DC | PRN

## 2013-05-08 MED ORDER — LIDOCAINE HCL (CARDIAC) 20 MG/ML IV SOLN
INTRAVENOUS | Status: DC | PRN
  Administered 2013-05-08: 100 mg via INTRAVENOUS

## 2013-05-08 MED ORDER — FENTANYL CITRATE 0.05 MG/ML IJ SOLN: 50.0000 ug | INTRAMUSCULAR | Status: DC | PRN

## 2013-05-08 MED ORDER — FENTANYL CITRATE 0.05 MG/ML IJ SOLN
INTRAMUSCULAR | Status: DC | PRN
  Administered 2013-05-08: 50 ug via INTRAVENOUS

## 2013-05-08 MED ORDER — ONDANSETRON HCL 4 MG/2ML IJ SOLN
INTRAMUSCULAR | Status: DC | PRN
  Administered 2013-05-08: 4 mg via INTRAVENOUS

## 2013-05-08 MED ORDER — MIDAZOLAM HCL 2 MG/2ML IJ SOLN: 0.5000 mg | Freq: Once | INTRAMUSCULAR | Status: DC | PRN

## 2013-05-08 MED ORDER — LIDOCAINE HCL 2 % IJ SOLN
INTRAMUSCULAR | Status: DC | PRN
  Administered 2013-05-08: 3 mL

## 2013-05-08 MED ORDER — FENTANYL CITRATE 0.05 MG/ML IJ SOLN
INTRAMUSCULAR | Status: AC
  Filled 2013-05-08: qty 4

## 2013-05-08 MED ORDER — PROMETHAZINE HCL 25 MG/ML IJ SOLN: 6.2500 mg | INTRAMUSCULAR | Status: DC | PRN

## 2013-05-08 MED ORDER — DEXAMETHASONE SODIUM PHOSPHATE 10 MG/ML IJ SOLN
INTRAMUSCULAR | Status: DC | PRN
  Administered 2013-05-08: 10 mg via INTRAVENOUS

## 2013-05-08 MED ORDER — LACTATED RINGERS IV SOLN
INTRAVENOUS | Status: DC
  Administered 2013-05-08: 07:00:00 via INTRAVENOUS

## 2013-05-08 MED ORDER — CHLORHEXIDINE GLUCONATE 4 % EX LIQD: 60.0000 mL | Freq: Once | CUTANEOUS | Status: DC

## 2013-05-08 MED ORDER — MIDAZOLAM HCL 5 MG/5ML IJ SOLN
INTRAMUSCULAR | Status: DC | PRN
  Administered 2013-05-08: 2 mg via INTRAVENOUS

## 2013-05-08 MED ORDER — MEPERIDINE HCL 25 MG/ML IJ SOLN: 6.2500 mg | INTRAMUSCULAR | Status: DC | PRN

## 2013-05-08 MED ORDER — OXYCODONE HCL 5 MG/5ML PO SOLN: 5.0000 mg | Freq: Once | ORAL | Status: DC | PRN

## 2013-05-08 SURGICAL SUPPLY — 42 items
BANDAGE ADH SHEER 1  50/CT (GAUZE/BANDAGES/DRESSINGS) IMPLANT
BANDAGE ELASTIC 3 VELCRO ST LF (GAUZE/BANDAGES/DRESSINGS) ×1 IMPLANT
BLADE SURG 15 STRL LF DISP TIS (BLADE) ×1 IMPLANT
BLADE SURG 15 STRL SS (BLADE) ×2
BNDG CMPR 9X4 STRL LF SNTH (GAUZE/BANDAGES/DRESSINGS) ×1
BNDG COHESIVE 3X5 TAN STRL LF (GAUZE/BANDAGES/DRESSINGS) ×1 IMPLANT
BNDG ESMARK 4X9 LF (GAUZE/BANDAGES/DRESSINGS) ×1 IMPLANT
BRUSH SCRUB EZ PLAIN DRY (MISCELLANEOUS) ×2 IMPLANT
CORDS BIPOLAR (ELECTRODE) IMPLANT
COVER MAYO STAND STRL (DRAPES) ×2 IMPLANT
COVER TABLE BACK 60X90 (DRAPES) ×2 IMPLANT
CUFF TOURNIQUET SINGLE 18IN (TOURNIQUET CUFF) ×1 IMPLANT
DECANTER SPIKE VIAL GLASS SM (MISCELLANEOUS) IMPLANT
DRAPE EXTREMITY T 121X128X90 (DRAPE) ×2 IMPLANT
DRAPE SURG 17X23 STRL (DRAPES) ×2 IMPLANT
GLOVE BIOGEL M STRL SZ7.5 (GLOVE) ×1 IMPLANT
GLOVE BIOGEL PI IND STRL 7.0 (GLOVE) IMPLANT
GLOVE BIOGEL PI INDICATOR 7.0 (GLOVE) ×1
GLOVE ECLIPSE 7.0 STRL STRAW (GLOVE) ×1 IMPLANT
GLOVE EXAM NITRILE MD LF STRL (GLOVE) ×1 IMPLANT
GLOVE ORTHO TXT STRL SZ7.5 (GLOVE) ×2 IMPLANT
GOWN STRL REUS W/ TWL LRG LVL3 (GOWN DISPOSABLE) ×1 IMPLANT
GOWN STRL REUS W/ TWL XL LVL3 (GOWN DISPOSABLE) IMPLANT
GOWN STRL REUS W/TWL LRG LVL3 (GOWN DISPOSABLE) ×2
GOWN STRL REUS W/TWL XL LVL3 (GOWN DISPOSABLE) ×4 IMPLANT
NEEDLE 27GAX1X1/2 (NEEDLE) ×1 IMPLANT
PACK BASIN DAY SURGERY FS (CUSTOM PROCEDURE TRAY) ×2 IMPLANT
PAD CAST 3X4 CTTN HI CHSV (CAST SUPPLIES) ×1 IMPLANT
PADDING CAST ABS 4INX4YD NS (CAST SUPPLIES)
PADDING CAST ABS COTTON 4X4 ST (CAST SUPPLIES) ×1 IMPLANT
PADDING CAST COTTON 3X4 STRL (CAST SUPPLIES) ×2
SPLINT PLASTER CAST XFAST 3X15 (CAST SUPPLIES) ×5 IMPLANT
SPLINT PLASTER XTRA FASTSET 3X (CAST SUPPLIES) ×5
SPONGE GAUZE 4X4 12PLY (GAUZE/BANDAGES/DRESSINGS) ×1 IMPLANT
STOCKINETTE 4X48 STRL (DRAPES) ×2 IMPLANT
STRIP CLOSURE SKIN 1/2X4 (GAUZE/BANDAGES/DRESSINGS) ×2 IMPLANT
SUT PROLENE 3 0 PS 2 (SUTURE) ×2 IMPLANT
SYR 3ML 23GX1 SAFETY (SYRINGE) IMPLANT
SYR CONTROL 10ML LL (SYRINGE) ×1 IMPLANT
TOWEL OR 17X24 6PK STRL BLUE (TOWEL DISPOSABLE) ×2 IMPLANT
TRAY DSU PREP LF (CUSTOM PROCEDURE TRAY) ×2 IMPLANT
UNDERPAD 30X30 INCONTINENT (UNDERPADS AND DIAPERS) ×2 IMPLANT

## 2013-05-08 NOTE — Op Note (Signed)
[id number]  

## 2013-05-08 NOTE — Anesthesia Procedure Notes (Signed)
Procedure Name: LMA Insertion Date/Time: 05/08/2013 7:43 AM Performed by: Lieutenant Diego Pre-anesthesia Checklist: Patient identified, Emergency Drugs available, Suction available and Patient being monitored Patient Re-evaluated:Patient Re-evaluated prior to inductionOxygen Delivery Method: Circle System Utilized Preoxygenation: Pre-oxygenation with 100% oxygen Intubation Type: IV induction Ventilation: Mask ventilation without difficulty LMA: LMA inserted LMA Size: 4.0 Number of attempts: 1 Airway Equipment and Method: bite block Placement Confirmation: positive ETCO2 and breath sounds checked- equal and bilateral Tube secured with: Tape Dental Injury: Teeth and Oropharynx as per pre-operative assessment

## 2013-05-08 NOTE — Anesthesia Preprocedure Evaluation (Addendum)
Anesthesia Evaluation  Patient identified by MRN, date of birth, ID band Patient awake    History of Anesthesia Complications Negative for: history of anesthetic complications  Airway Mallampati: II TM Distance: >3 FB Neck ROM: Full    Dental  (+) Teeth Intact and Dental Advisory Given   Pulmonary asthma (last inhaler almost a year ago) ,  breath sounds clear to auscultation  Pulmonary exam normal       Cardiovascular negative cardio ROS  Rhythm:Regular Rate:Normal     Neuro/Psych negative neurological ROS  negative psych ROS   GI/Hepatic Neg liver ROS, GERD-  Controlled,  Endo/Other  negative endocrine ROS  Renal/GU negative Renal ROS     Musculoskeletal   Abdominal   Peds  Hematology   Anesthesia Other Findings Breast cancer: surgery, XRT  Reproductive/Obstetrics                          Anesthesia Physical Anesthesia Plan  ASA: II  Anesthesia Plan: General   Post-op Pain Management:    Induction: Intravenous  Airway Management Planned: LMA  Additional Equipment:   Intra-op Plan:   Post-operative Plan:   Informed Consent: I have reviewed the patients History and Physical, chart, labs and discussed the procedure including the risks, benefits and alternatives for the proposed anesthesia with the patient or authorized representative who has indicated his/her understanding and acceptance.   Dental advisory given  Plan Discussed with: Surgeon and CRNA  Anesthesia Plan Comments: (Plan routine monitors, GA- LMA OK)        Anesthesia Quick Evaluation

## 2013-05-08 NOTE — Brief Op Note (Signed)
05/08/2013  8:11 AM  PATIENT:  Sarah Underwood  68 y.o. female  PRE-OPERATIVE DIAGNOSIS:  RIGHT CARPAL TUNNEL SYNDROME  POST-OPERATIVE DIAGNOSIS:  RIGHT CARPAL TUNNEL SYNDROME  PROCEDURE:  Procedure(s): RIGHT CARPAL TUNNEL RELEASE (Right)  SURGEON:  Surgeon(s) and Role:    * Cammie Sickle., MD - Primary  PHYSICIAN ASSISTANT:   ASSISTANTS: surgical tech   ANESTHESIA:   general  EBL:  Total I/O In: 500 [I.V.:500] Out: -   BLOOD ADMINISTERED:none  DRAINS: none   LOCAL MEDICATIONS USED:  LIDOCAINE   SPECIMEN:  No Specimen  DISPOSITION OF SPECIMEN:  N/A  COUNTS:  YES  TOURNIQUET:   Total Tourniquet Time Documented: Upper Arm (Right) - 8 minutes Total: Upper Arm (Right) - 8 minutes   DICTATION: .Other Dictation: Dictation Number [id number]  PLAN OF CARE: Discharge to home after PACU  PATIENT DISPOSITION:  PACU - hemodynamically stable.   Delay start of Pharmacological VTE agent (>24hrs) due to surgical blood loss or risk of bleeding: not applicable

## 2013-05-08 NOTE — Anesthesia Postprocedure Evaluation (Signed)
  Anesthesia Post-op Note  Patient: Sarah Underwood  Procedure(s) Performed: Procedure(s): RIGHT CARPAL TUNNEL RELEASE (Right)  Patient Location: PACU  Anesthesia Type:General  Level of Consciousness: awake, alert , oriented and patient cooperative  Airway and Oxygen Therapy: Patient Spontanous Breathing  Post-op Pain: none  Post-op Assessment: Post-op Vital signs reviewed, Patient's Cardiovascular Status Stable, Respiratory Function Stable, Patent Airway, No signs of Nausea or vomiting and Pain level controlled  Post-op Vital Signs: Reviewed and stable  Complications: No apparent anesthesia complications

## 2013-05-08 NOTE — Discharge Instructions (Addendum)

## 2013-05-08 NOTE — Transfer of Care (Signed)
Immediate Anesthesia Transfer of Care Note  Patient: Sarah Underwood  Procedure(s) Performed: Procedure(s): RIGHT CARPAL TUNNEL RELEASE (Right)  Patient Location: PACU  Anesthesia Type:General  Level of Consciousness: sedated  Airway & Oxygen Therapy: Patient Spontanous Breathing and Patient connected to face mask oxygen  Post-op Assessment: Report given to PACU RN and Post -op Vital signs reviewed and stable  Post vital signs: Reviewed and stable  Complications: No apparent anesthesia complications

## 2013-05-09 ENCOUNTER — Encounter (HOSPITAL_BASED_OUTPATIENT_CLINIC_OR_DEPARTMENT_OTHER): Payer: Self-pay | Admitting: Orthopedic Surgery

## 2013-05-09 NOTE — Op Note (Signed)
[name], Sarah              ACCOUNT NO.:  [id number]  MEDICAL RECORD NO.:  [id number]  LOCATION:                                 FACILITY:  PHYSICIAN:  Youlanda Mighty. Hogan Hoobler, M.D.      DATE OF BIRTH:  DATE OF PROCEDURE:  05/08/2013 DATE OF DISCHARGE:                              OPERATIVE REPORT   PREOPERATIVE DIAGNOSIS:  Entrapment neuropathy, right median nerve at carpal tunnel.  POSTOPERATIVE DIAGNOSIS:  Entrapment neuropathy, right median nerve at carpal tunnel.  OPERATIONS:  Release of right transverse carpal ligament.  OPERATING SURGEON:  Youlanda Mighty. Anniya Whiters, MD.  ASSISTANT:  Surgical technician.  ANESTHESIA:  General by LMA.  SUPERVISING ANESTHESIOLOGIST:  Dr. Glennon Mac.  INDICATIONS:  Sarah Underwood is a 68 year old woman referred by Dr. Rolm Bookbinder, for management of wrist pain and hand numbness.  She is noted have advanced facet arthrosis.  She also had signs of significant carpal tunnel syndrome.  Electrodiagnostic studies completed in September 2014 documented very significant right carpal tunnel syndrome. She has not been successful with nonoperative measures.  Therefore, she presents at this time requesting release of her right transverse carpal ligament.  Preoperatively, she was advised potential risks and benefits of surgery. She understands that it will take a while to have recovery of her sensibility.  She was interviewed by Dr. Glennon Mac in the holding area and detailed anesthesia informed consent was provided.  Questions were invited and answered in detail.  Her right hand was marked as the proper surgical site per protocol with a marking pen.  PROCEDURE IN DETAIL:  [name] was brought to room #2 of the [facility] and placed in supine position on the operating table.  Following the induction of general anesthesia by LMA technique, the right hand and arm were prepped with Betadine soap and solution, sterilely draped.  Following  exsanguination of the right hand and arm with an Esmarch bandage, an arterial tourniquet proximal right brachium was inflated to 220 mmHg.  A routine surgical time-out was accomplished. The procedure commenced with a short incision in the line of the ring finger and the palm.  Subcutaneous tissues were carefully divided in the palmar fascia.  This split longitudinally to the common sensory branch of median nerve.  The transverse carpal ligaments was isolated from the median nerve proper with a Insurance account manager.  The transcarpal ligament released along its ulnar border extending into the distal forearm.  This widely opened carpal canal.  The volar forearm fascia was likewise released 4 cm above the distal wrist flexion crease with scissors.  No mass or other predicaments were noted.  The wound was then repaired with intradermal 3-0 Prolene.  A compressive dressing was applied with volar plaster splint maintaining the wrist in 5 degrees of dorsiflexion.  For aftercare, [name] was placed in a compressive dressing with Steri-Strips, sterile gauze, sterile Webril, and a volar plaster splint maintaining the wrist at 15 degrees of dorsiflexion.  For aftercare, she chooses to use Tylenol and Advil as her primary analgesics.  She declined an opioid analgesics.  Questions were invited and answered in detail.  We will see her back for followup in  our office in 7 days for dressing changes and suture removal.     Youlanda Mighty. Quinterrius Errington, M.D.     RVS/MEDQ  D:  05/08/2013  T:  05/09/2013  Job:  [id number]  cc:   Rolm Bookbinder, MD

## 2013-08-18 ENCOUNTER — Other Ambulatory Visit: Payer: Self-pay | Admitting: Cardiology

## 2013-08-18 ENCOUNTER — Ambulatory Visit
Admission: RE | Admit: 2013-08-18 | Discharge: 2013-08-18 | Disposition: A | Discharge status: Home / Self Care | Payer: Medicare Other | Source: Ambulatory Visit | Attending: Cardiology | Admitting: Cardiology

## 2013-08-18 DIAGNOSIS — J8410 Pulmonary fibrosis, unspecified: Secondary | ICD-10-CM

## 2013-08-18 IMAGING — CR DG CHEST 2V
2 series · 2 of 2 positions shown · non-contrast
Comparison: [DATE]

CLINICAL DATA: Pulmonary fibrosis; prior breast carcinoma with
radiation therapy

EXAM:
CHEST  2 VIEW

[w chest pa]
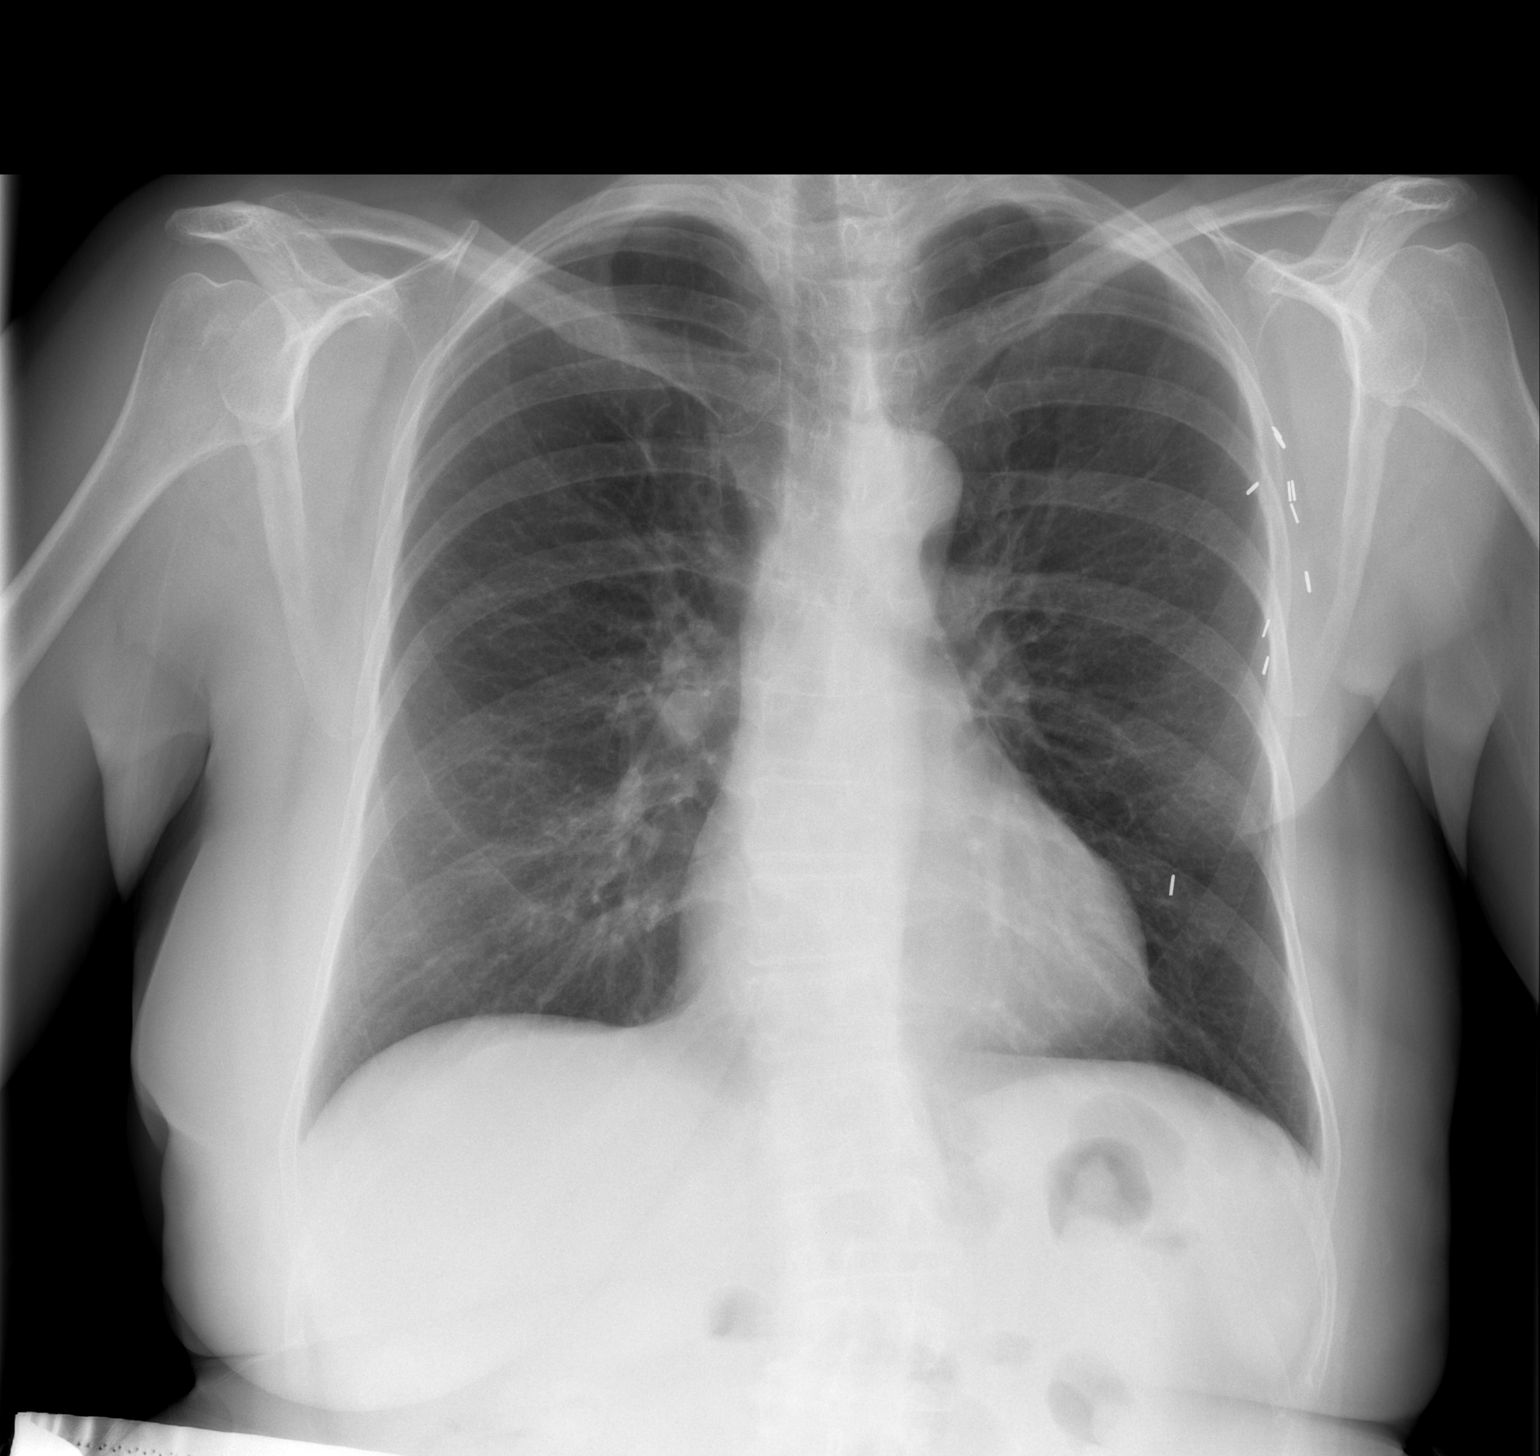

[w chest lat]
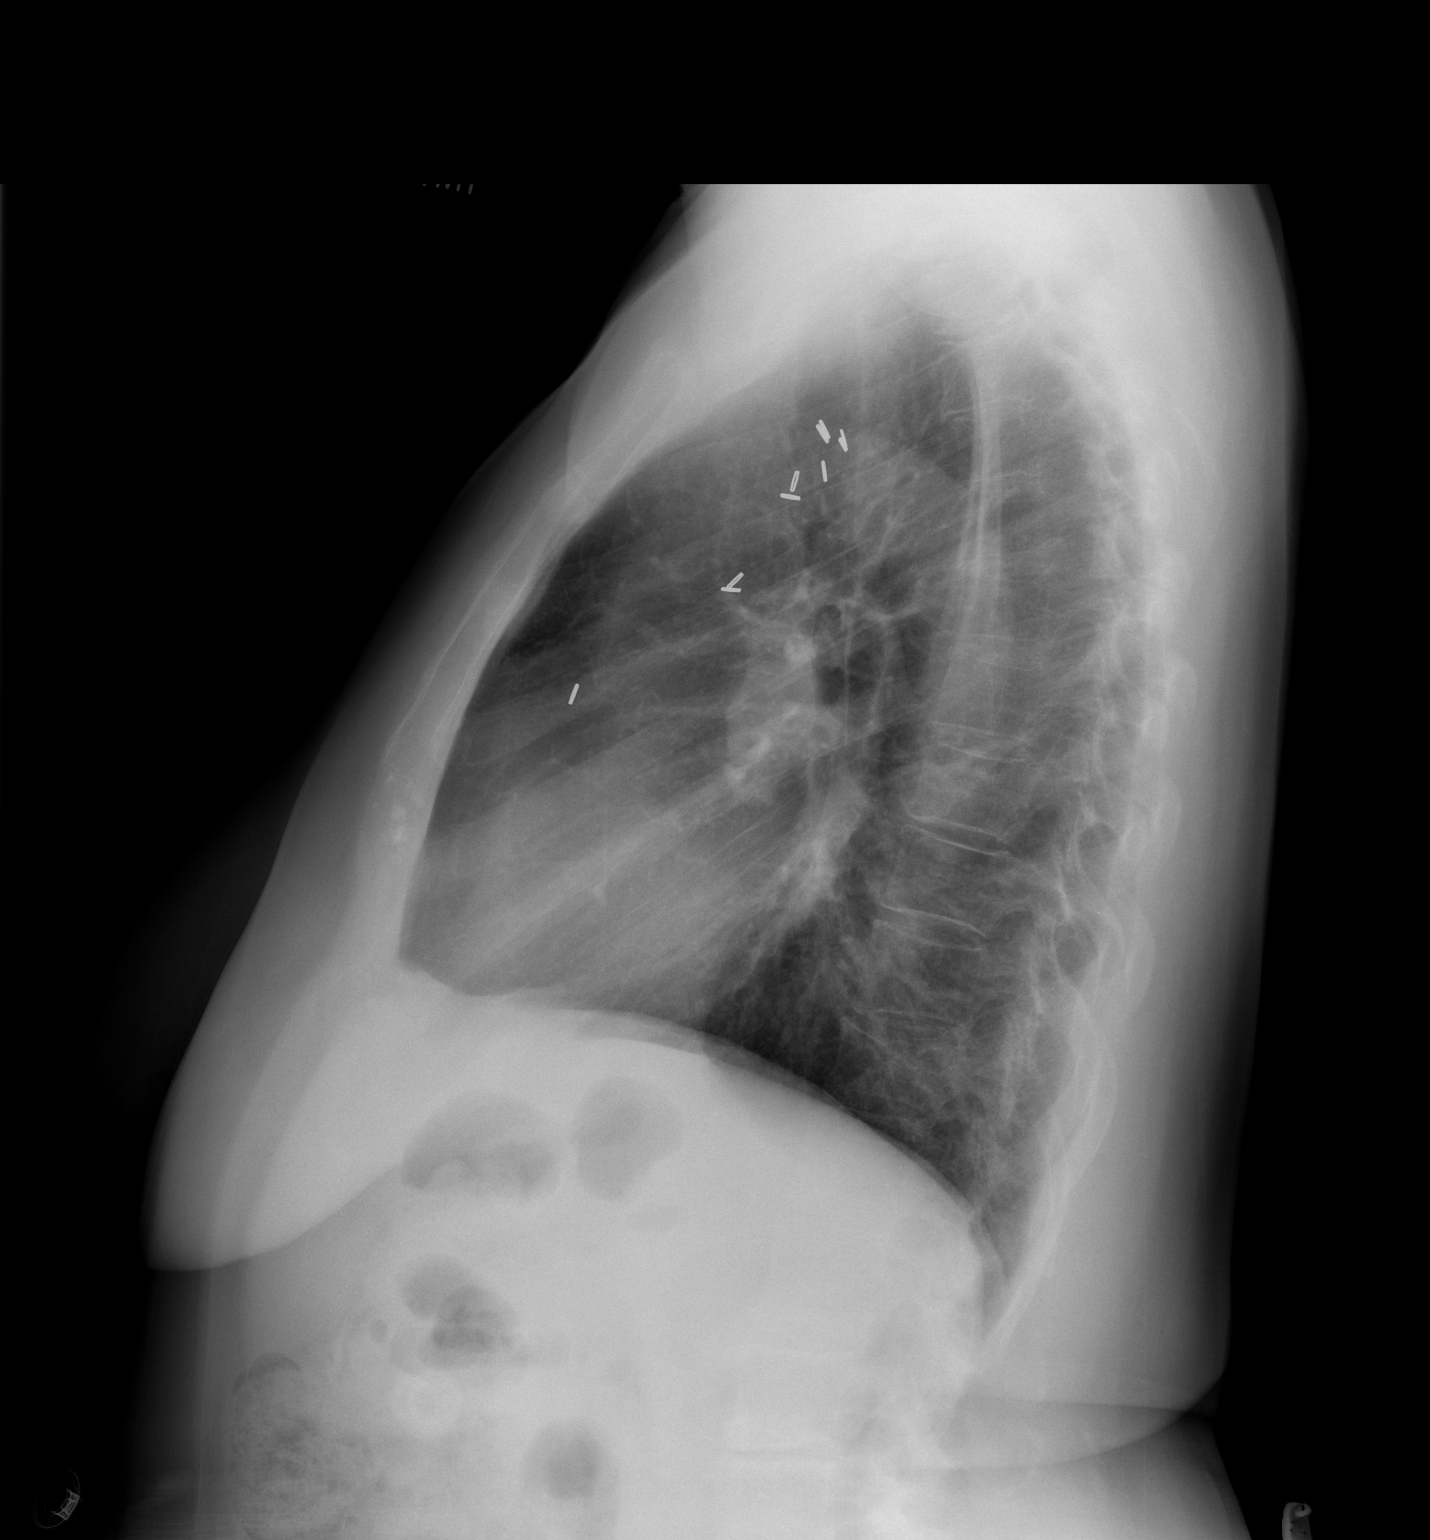

[2 of 2 positions shown; findings below may reference images not displayed]

FINDINGS: There is a degree of underlying emphysematous change. There is no
edema or consolidation. The heart size is within normal limits.
Pulmonary vascularity reflects underlying emphysema. No adenopathy.

Patient is status post left mastectomy with surgical clips on the
left. There is upper lumbar levoscoliosis. No blastic or lytic bone
lesions apparent.
IMPRESSION: Underlying emphysematous change. No edema or consolidation. No
fibrotic type changes appreciated on this examination. No
adenopathy.

## 2013-09-19 ENCOUNTER — Telehealth: Payer: Self-pay | Admitting: *Deleted

## 2013-09-19 NOTE — Telephone Encounter (Signed)
Message from pt requesting order for bone density to be faxed to [phone]. Request to Dr. Benay Spice for order.

## 2013-09-22 ENCOUNTER — Other Ambulatory Visit: Payer: Self-pay | Admitting: Oncology

## 2013-09-22 DIAGNOSIS — M8580 Other specified disorders of bone density and structure, unspecified site: Secondary | ICD-10-CM

## 2013-09-25 ENCOUNTER — Telehealth: Payer: Self-pay | Admitting: Oncology

## 2013-09-25 NOTE — Telephone Encounter (Signed)
S/w [facility] and the pt is scheduled for her mammo and bone density appts in aug at the [facility].

## 2013-11-06 ENCOUNTER — Ambulatory Visit: Payer: Medicare Other | Admitting: Oncology

## 2013-11-07 ENCOUNTER — Telehealth: Payer: Self-pay | Admitting: Oncology

## 2013-11-07 NOTE — Telephone Encounter (Signed)
Lft msg for pt per provider pt was double booked and moved to another time same day, mailed updated schedule.Marland Kitchen..KJ

## 2013-11-10 ENCOUNTER — Ambulatory Visit: Payer: Medicare Other | Admitting: Oncology

## 2013-11-10 ENCOUNTER — Telehealth: Payer: Self-pay | Admitting: Oncology

## 2013-11-10 NOTE — Telephone Encounter (Signed)
spk w/pt advised of new apt w/Dr. Benay Spice, pt confirmed and mailed out updated sch to pt...Marland KitchenMarland KitchenKJ

## 2013-12-02 ENCOUNTER — Other Ambulatory Visit: Payer: Self-pay | Admitting: *Deleted

## 2013-12-02 ENCOUNTER — Telehealth: Payer: Self-pay | Admitting: *Deleted

## 2013-12-02 DIAGNOSIS — C50919 Malignant neoplasm of unspecified site of unspecified female breast: Secondary | ICD-10-CM

## 2013-12-02 MED ORDER — ANASTROZOLE 1 MG PO TABS: ORAL_TABLET | ORAL | Status: DC

## 2013-12-02 MED ORDER — ANASTROZOLE 1 MG PO TABS: 1.0000 mg | ORAL_TABLET | Freq: Every day | ORAL | Status: DC

## 2013-12-02 NOTE — Telephone Encounter (Signed)
Notified that mail order was sent today and 30 day supply also sent today to [facility] as requested.

## 2013-12-02 NOTE — Telephone Encounter (Signed)
Left VM asking for confirmation that her 90 day script for anastrazole was sent to [facility]. Is almost out, so needs a 30 day supply sent to her [facility] on [address].

## 2014-01-01 ENCOUNTER — Encounter: Payer: Self-pay | Admitting: Nurse Practitioner

## 2014-01-22 ENCOUNTER — Ambulatory Visit (HOSPITAL_BASED_OUTPATIENT_CLINIC_OR_DEPARTMENT_OTHER): Payer: Medicare Other | Admitting: Genetic Counselor

## 2014-01-22 ENCOUNTER — Other Ambulatory Visit: Payer: Medicare Other

## 2014-01-22 ENCOUNTER — Encounter: Payer: Self-pay | Admitting: Genetic Counselor

## 2014-01-22 DIAGNOSIS — C50912 Malignant neoplasm of unspecified site of left female breast: Secondary | ICD-10-CM

## 2014-01-22 DIAGNOSIS — C44501 Unspecified malignant neoplasm of skin of breast: Secondary | ICD-10-CM

## 2014-01-22 DIAGNOSIS — Z803 Family history of malignant neoplasm of breast: Secondary | ICD-10-CM

## 2014-01-22 DIAGNOSIS — C4490 Unspecified malignant neoplasm of skin, unspecified: Secondary | ICD-10-CM | POA: Insufficient documentation

## 2014-01-22 DIAGNOSIS — Z315 Encounter for genetic counseling: Secondary | ICD-10-CM

## 2014-01-22 NOTE — Progress Notes (Signed)
Patient Name: Sarah Underwood Patient Age: 68 y.o. Encounter Date: 01/22/2014  Referring Physician: Lurline Del, MD  Primary Care Provider: Velna Hatchet, MD   [name], a 68 y.o. female, is being seen at the [facility] due to a personal and family history of breast cancer. She presents to clinic today to discuss the possibility of a hereditary predisposition to cancer and discuss whether genetic testing is warranted.  HISTORY OF PRESENT ILLNESS: Sarah Underwood was diagnosed with left breast cancer in 1995 at the age of 89. She is s/p left mastectomy. The breast tumor was ER positive. In 2001, she had a recurrence in the left chest wall and had radiation and is still on Arimidex.  She reports a history of basal cell skin cancer several times.   Past Medical History  Diagnosis Date  . Graves disease 2007    thyroid nodule  . Ovarian mass     small solid nodule 8x77m: Right ovary - stable since 2000  . Breast cancer 1995 & 2001    left  . Basal cell carcinoma 1994    right side of node  . Complication of anesthesia     slow waking up  . Seasonal allergies   . Skin cancer     basal cell- nose    Past Surgical History  Procedure Laterality Date  . Tonsillectomy and adenoidectomy  1950  . Mastectomy Left 3/95    Stage IIb 0/14 LN ER/ PR + with mastectomy - Dr. LMendel Ryder . Breast biopsy Right 1987    benign fibroadenoma  . Colposcopy w/ biopsy / curettage  03/12/12    benign, but + HR HPV  . Breast cancer recurrence Left 01/2000    at site of left chest wall after previous mastectomy with removal and treatment with radiaton.  . Ovary surgery  1974    lt  . Carpal tunnel release Right 05/08/2013    Procedure: RIGHT CARPAL TUNNEL RELEASE;  Surgeon: RCammie Sickle, MD;  [facility]  Service: Orthopedics;  Laterality: Right;    History   Social History  . Marital Status: Divorced    Spouse Name: N/A    Number of  Children: 1  . Years of Education: N/A   Occupational History  .  GTyroHistory Main Topics  . Smoking status: Never Smoker   . Smokeless tobacco: Never Used  . Alcohol Use: No     Comment: rare  . Drug Use: No  . Sexual Activity: No   Other Topics Concern  . Not on file   Social History Narrative  . No narrative on file     FAMILY HISTORY:   During the visit, a 4-generation pedigree was obtained. Family tree will be sent for scanning and will be in EPIC under the Media tab.  Significant diagnoses include the following:  Family History  Problem Relation Age of Onset  . Breast cancer Mother 641   deceased 819 . Osteoporosis Mother   . Hypertension Mother   . Hypertension Father   . Heart attack Father   . Breast cancer Sister 524   bilateral; currently 637 . Diabetes Brother   . Lung cancer Maternal Grandfather     deceased 732 . Cancer Paternal Grandmother     duodenal; deceased 826   Additionally, Ms. SBoydehas a daughter (age 68. She also has 3 brothers in addition to  the sister mentioned above.   Ms. Gover ancestry is [place]. She thinks there may be remote Ashkenazi Jewish ancestry in her maternal lineage.  ASSESSMENT AND PLAN: Sarah Underwood is a 68 y.o. female with a personal and family history of breast cancer. This history is somewhat suggestive of a hereditary predisposition to cancer, but may be due to a non-BRCA mutation given the ages of onset in the family. We reviewed the characteristics, features and inheritance patterns of hereditary cancer syndromes. We also discussed genetic testing, including the other appropriate family members to test, the process of testing, insurance coverage and implications of results. A negative result will be overall reassuring.  Sarah Underwood wished to pursue genetic testing and a blood sample will be sent to [facility] for analysis of the 17 genes on the BreastNext gene panel. We  discussed the implications of a positive, negative and/ or Variant of Uncertain Significance (VUS) result. Results should be available in approximately 4-5 weeks, at which point we will contact her and address implications for her as well as address genetic testing for at-risk family members, if needed.    We encouraged Ms. Kemmerer to remain in contact with Cancer Genetics annually so that we can update the family history and inform her of any changes in cancer genetics and testing that may be of benefit for this family. Ms.  Haskew questions were answered to her satisfaction today.   Thank you for the referral and allowing [place] to share in the care of your patient.   The patient was seen for a total of 30 minutes, greater than 50% of which was spent face-to-face counseling. This patient was discussed with the overseeing provider who agrees with the above.   Steele Berg, MS, [place] Certified Genetic Counseor phone: [phone] [email or web address]

## 2014-02-16 ENCOUNTER — Encounter: Payer: Self-pay | Admitting: Genetic Counselor

## 2014-02-16 NOTE — Progress Notes (Signed)
GENETIC TEST RESULTS  Patient Name: Sarah Underwood Patient Age: 68 y.o. Encounter Date: 02/16/2014  Referring Physician: Betsy Coder, MD   Ms. Mcadoo was called today to discuss genetic test results. Please see the Genetics note from her visit on 01/22/14 for a detailed discussion of her personal and family history.  GENETIC TESTING: At the time of Ms. Gurganus's visit, we recommended she pursue genetic testing of multiple genes on the BreastNext gene panel. This test, which included sequencing and deletion/duplication analysis of 17 genes, was performed at [facility]. Testing was normal and did not reveal a mutation in these genes. The genes tested were ATM, BARD1, BRCA1, BRCA2, BRIP1, CDH1, CHEK2, MRE11A, MUTYH, NBN, NF1, PALB2, PTEN, RAD50, RAD51C, RAD51D, and TP53.  We discussed with Ms. Maish that since the current test is not perfect, it is possible there may be a gene mutation that current testing cannot detect, but that chance is small. We also discussed that it is possible that a different genetic factor, which was not part of this testing or has not yet been discovered, is responsible for the cancer diagnoses in the family. Should Ms. Ferraiolo wish to discuss or pursue this additional testing, we are happy to coordinate this at any time, but do not feel that she is at significant risk of harboring a mutation in a different gene.       CANCER SCREENING: This result suggests that Ms. Corporan's cancer was most likely not due to an inherited predisposition. Most cancers happen by chance and this negative test, along with details of her family history, suggests that her cancer falls into this category. We, therefore, recommended she continue to follow the cancer screening guidelines provided by her physician.   FAMILY MEMBERS: While these results are reassuring for Ms. Duskin, her sister who had bilateral breast cancer may also with to have genetic counseling and testing.  Please let us know if we can help facilitate testing. Genetic counselors can be located in other cities, by visiting the website of the [employer] (ArtistMovie.se) and Field seismologist for a Dietitian by zip code.    Lastly, we discussed with Ms. Bernet that cancer genetics is a rapidly advancing field and it is possible that new genetic tests will be appropriate for her in the future. We encouraged her to remain in contact with [place] on an annual basis so we can update her personal and family histories, and let her know of advances in cancer genetics that may benefit the family. Our contact number was provided. Ms. Brailsford questions were answered to her satisfaction today, and she knows she is welcome to call anytime with additional questions.    Steele Berg, MS, [place] Certified Genetic Counselor phone: [phone] [email or web address]

## 2014-02-23 ENCOUNTER — Encounter: Payer: Self-pay | Admitting: Genetic Counselor

## 2014-02-26 ENCOUNTER — Ambulatory Visit: Payer: BC Managed Care – PPO | Admitting: Nurse Practitioner

## 2014-03-03 ENCOUNTER — Ambulatory Visit (HOSPITAL_BASED_OUTPATIENT_CLINIC_OR_DEPARTMENT_OTHER): Payer: Medicare Other | Admitting: Oncology

## 2014-03-03 ENCOUNTER — Telehealth: Payer: Self-pay | Admitting: Oncology

## 2014-03-03 VITALS — BP 150/64 | HR 72 | Temp 98.1°F | Resp 18 | Ht 64.0 in | Wt 170.1 lb

## 2014-03-03 DIAGNOSIS — C50912 Malignant neoplasm of unspecified site of left female breast: Secondary | ICD-10-CM

## 2014-03-03 DIAGNOSIS — Z853 Personal history of malignant neoplasm of breast: Secondary | ICD-10-CM

## 2014-03-03 NOTE — Progress Notes (Signed)
  [place] OFFICE PROGRESS NOTE   Diagnosis: breast cancer  INTERVAL HISTORY:   Sarah Underwood returns as scheduled. She continues Arimidex. Hot flashes in the evening. Mild arthralgias. She complains of vaginal dryness. This makes her Pap smear painful. She would like to try an estrogen cream for a few weeks prior to her Pap smear.  She underwent a cardiology evaluation for an arrhythmia and reports being diagnosed with PVCs.during the cardiology evaluation she was found to have "COPD ".  She underwent negative hereditary breast cancer testing with a "breast next "gene panel.  A bone density scan in August revealed no significant change in osteopenia compared to 2013. Objective:  Vital signs in last 24 hours:  Blood pressure 150/64, pulse 72, temperature 98.1 F (36.7 C), temperature source Oral, resp. rate 18, height 5\' 4"  (1.626 m), weight 170 lb 1.6 oz (77.157 kg).    HEENT: neck without mass Lymphatics: no cervical, supraclavicular, or axillary nodes Resp: lungs clear bilaterally Cardio: regular rate and rhythm GI: no hepatomegaly Vascular: no leg edema Breasts: status post left mastectomy. No evidence for chest wall tumor recurrence. Right breast without mass. Skin:radiation telangiectasias over the left anterior chest    Medications: I have reviewed the patient's current medications.  Assessment/Plan: Sarah Underwood has a history of left-sided breast cancer dating to 12. She was diagnosed with a chest wall recurrence in October of 2001. She remains on indefinite Arimidex. She remains in clinical remission.  She will schedule a right mammogram for August of 2016. Sarah Underwood will return for an office visit in one year.  She has a history of osteopenia without a significant change on a bone density scan in August 2015.  She has vaginal dryness secondary to a lack of estrogen. We discussed the potential increased risk of breast cancer with vaginal  estrogen therapy. I think it is reasonable to take estrogen for 1-2 weeks prior to her Pap smear to see if this helps the discomfort from this procedure.    Betsy Coder, MD  03/03/2014  3:40 PM

## 2014-03-03 NOTE — Telephone Encounter (Signed)
gv and printed appt sched and avs fo rpt for NOV 2016

## 2014-03-04 ENCOUNTER — Encounter: Payer: Self-pay | Admitting: Nurse Practitioner

## 2014-03-04 ENCOUNTER — Ambulatory Visit (INDEPENDENT_AMBULATORY_CARE_PROVIDER_SITE_OTHER): Payer: Medicare Other | Admitting: Nurse Practitioner

## 2014-03-04 VITALS — BP 116/66 | HR 68 | Resp 16 | Ht 63.75 in | Wt 169.0 lb

## 2014-03-04 DIAGNOSIS — R87810 Cervical high risk human papillomavirus (HPV) DNA test positive: Secondary | ICD-10-CM

## 2014-03-04 DIAGNOSIS — Z1371 Encounter for nonprocreative screening for genetic disease carrier status: Secondary | ICD-10-CM

## 2014-03-04 DIAGNOSIS — C50912 Malignant neoplasm of unspecified site of left female breast: Secondary | ICD-10-CM

## 2014-03-04 DIAGNOSIS — Z01419 Encounter for gynecological examination (general) (routine) without abnormal findings: Secondary | ICD-10-CM

## 2014-03-04 DIAGNOSIS — N952 Postmenopausal atrophic vaginitis: Secondary | ICD-10-CM

## 2014-03-04 NOTE — Progress Notes (Signed)
Patient ID: Sarah Underwood, female   DOB: Mar 20, 1946, 68 y.o.   MRN: [id number] 68 y.o. [id number] Divorced Caucasian Fe here for annual exam.  No new health problems. She lives in a friends home and is quite settled there. 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  She has some major concerns about her risk for OV cancer.  She is BRCA negative but wants to have Oophorectomy and Hysterectomy.  She has had + HR HPV with Colpo biopsy 11/13 which was negative.  She had  past solid OV mass on the right and OV cyst on the left that we have been following with PUS. Last one done 2010.  She is barely able to tolerate a vaginal exam for a pap and PUS is very difficult for her.  She wishes to have more definitive surgical options.  Patient's last menstrual period was 04/24/2000.          Sexually active: no   The current method of family planning is abstinence.     Exercising: yes  Gym/ health club routine includes light weights, core and walking.  Sees trainer at gym once per week. Smoker:  no  Health Maintenance: Pap:  02/21/13, negative with neg HR HPV; 02/20/12, WNL, pos HR HPV, + 16/ negative 18 MMG:  12/08/13, 3D; Bi-Rads 2, Benign findings Colonoscopy:  03/25/10, int hemorrhoids, repeat 5 years BMD:   12/08/13, -2.0 R/-1.6 L/-2.1 Radius, spine not measured TDaP:  2011 Shingles; 2013 Pneumonia: 20113 Labs:  By PCP, pt brought copies   reports that she has never smoked. She has never used smokeless tobacco. She reports that she does not drink alcohol or use illicit drugs.  Past Medical History  Diagnosis Date  . Graves disease 2007    thyroid nodule  . Ovarian mass     small solid nodule 8x47m: Right ovary - stable since 2000  . Breast cancer 1995 & 2001    left  . Basal cell carcinoma 1994    right side of node  . Complication of anesthesia     slow waking up  . Seasonal allergies   . Skin cancer     basal cell- nose    Past Surgical History   Procedure Laterality Date  . Tonsillectomy and adenoidectomy  1950  . Mastectomy Left 3/95    Stage IIb 0/14 LN ER/ PR + with mastectomy - Dr. LMendel Ryder . Breast biopsy Right 1987    benign fibroadenoma  . Colposcopy w/ biopsy / curettage  03/12/12    benign, but + HR HPV  . Breast cancer recurrence Left 01/2000    at site of left chest wall after previous mastectomy with removal and treatment with radiaton.  . Ovary surgery  1974    lt  . Carpal tunnel release Right 05/08/2013    Procedure: RIGHT CARPAL TUNNEL RELEASE;  Surgeon: RCammie Sickle, MD;  Location: [facility]  Service: Orthopedics;  Laterality: Right;    Current Outpatient Prescriptions  Medication Sig Dispense Refill  . albuterol (PROVENTIL HFA;VENTOLIN HFA) 108 (90 BASE) MCG/ACT inhaler Inhale 2 puffs into the lungs every 4 (four) hours as needed for wheezing. 1 Inhaler 0  . anastrozole (ARIMIDEX) 1 MG tablet TAKE 1 TABLET DAILY 90 tablet 3  . calcium gluconate 500 MG tablet Take 1 tablet (500 mg total) by mouth daily.    . Cholecalciferol (VITAMIN D-3 PO) Take 1,000 Units by mouth  daily.     . Multiple Vitamins-Minerals (MULTIVITAL PO) Take 1 tablet by mouth daily.     No current facility-administered medications for this visit.    Family History  Problem Relation Age of Onset  . Breast cancer Mother 56    deceased 86  . Osteoporosis Mother   . Hypertension Mother   . Hypertension Father   . Heart attack Father   . Breast cancer Sister 97    bilateral; currently 64  . Diabetes Brother   . Lung cancer Maternal Grandfather     deceased 89  . Cancer Paternal Grandmother     duodenal; deceased 23    ROS:  Pertinent items are noted in HPI.  Otherwise, a comprehensive ROS was negative.  Exam:   BP 116/66 mmHg  Pulse 68  Resp 16  Ht 5' 3.75" (1.619 m)  Wt 169 lb (76.658 kg)  BMI 29.25 kg/m2  LMP 04/24/2000 Height: 5' 3.75" (161.9 cm)  Ht Readings from Last 3 Encounters:  03/04/14 5'  3.75" (1.619 m)  03/03/14 '5\' 4"'  (1.626 m)  05/08/13 '5\' 4"'  (1.626 m)    General appearance: alert, cooperative and appears stated age Head: Normocephalic, without obvious abnormality, atraumatic Neck: no adenopathy, supple, symmetrical, trachea midline and thyroid normal to inspection and palpation Lungs: clear to auscultation bilaterally Breasts: normal appearance, no masses or tenderness, on the right.  Left mastectomy with out new mass. Heart: regular rate and rhythm Abdomen: soft, non-tender; no masses,  no organomegaly Extremities: extremities normal, atraumatic, no cyanosis or edema Skin: Skin color, texture, turgor normal. No rashes or lesions Lymph nodes: Cervical, supraclavicular, and axillary nodes normal. No abnormal inguinal nodes palpated Neurologic: Grossly normal   Pelvic: External genitalia:  no lesions              Urethra:  normal appearing urethra with no masses, tenderness or lesions              Bartholin's and Skene's: normal                 Vagina: very atrophic and stenotic vault vagina with pale color and no discharge, no lesions              Cervix: anteverted              Pap taken: Yes.   cytobrush was mainly used as a blind specimen Bimanual Exam:  Uterus:  normal size, contour, position, consistency, mobility, non-tender              Adnexa: no mass, fullness, tenderness               Rectovaginal: Confirms               Anus:  normal sphincter tone, no lesions  A:  Well Woman with normal exam  Postmenopausal S/P left breast cancer 3/95 & 04/2000 on Arimidex, treatment with radiation, surgery History of Abnormal pap with + # 16 HR HPV and normal Colpo biopsy 11/13 Atrophic vaginitis   History of right OV mass and left OV cyst followed by PUS  [facility] of breast cancer: Mother, PGM, Sister  Patient BRCA negative  Osteopenia   P:   Reviewed health and wellness pertinent to exam  Pap smear taken  today  Mammogram is due 8/16  Will follow with pap  Counseled on breast self exam, mammography screening, adequate intake of calcium and vitamin D, diet and exercise return annually or prn  An After Visit Summary was printed and given to the patient.  Will need to discuss with Dr. Sabra Heck as to next step.

## 2014-03-04 NOTE — Patient Instructions (Signed)

## 2014-03-05 LAB — IPS PAP TEST WITH HPV

## 2014-03-06 NOTE — Progress Notes (Signed)
Reviewed personally.  M. Suzanne Mar Walmer, MD.  

## 2014-03-11 ENCOUNTER — Telehealth: Payer: Self-pay | Admitting: *Deleted

## 2014-03-11 NOTE — Telephone Encounter (Signed)
Notified pt.  Appt scheduled for 05/04/14 @ 1:30pm.  Pt is ok with this time as she has time commitments of her own through the next two months.  Routing to provider for final review.

## 2014-03-11 NOTE — Telephone Encounter (Signed)
-----   Message from Milford Cage, [place] sent at 03/05/2014  5:42 PM EST ----- Patient is waiting to hear back about what Dr. Sabra Heck was going to say.  Please call her to schedule a consult visit.  thanks ----- Message -----    From: Lyman Speller, MD    Sent: 03/05/2014   4:23 PM      To: Milford Cage, FNP  I am happy to see her and discuss her desires.  I can't guarantee I will agree with surgery as she has several "smaller" issues but I am willing to discuss this with her.  Thanks.  MSM ----- Message -----    From: Milford Cage, FNP    Sent: 03/04/2014   6:34 PM      To: Lyman Speller, MD  Dr. Sabra Heck, this patient wants Hysterectomy with BSO.  She has a lot of concerns about OV cancer.  Please see AEX and paper chart on your desk.  Please have Claiborne Billings to call her with answers for another PUS vs. Consult for surgical options.  Or maybe just a consult first -she is OK with that and she can tell you her concerns.

## 2014-05-04 ENCOUNTER — Ambulatory Visit: Payer: Self-pay | Admitting: Obstetrics & Gynecology

## 2014-05-11 ENCOUNTER — Encounter: Payer: Self-pay | Admitting: Obstetrics & Gynecology

## 2014-05-12 ENCOUNTER — Encounter: Payer: Self-pay | Admitting: Obstetrics & Gynecology

## 2014-05-12 ENCOUNTER — Ambulatory Visit (INDEPENDENT_AMBULATORY_CARE_PROVIDER_SITE_OTHER): Payer: Medicare Other | Admitting: Obstetrics & Gynecology

## 2014-05-12 VITALS — BP 160/78 | HR 68 | Resp 16 | Wt 172.4 lb

## 2014-05-12 DIAGNOSIS — N838 Other noninflammatory disorders of ovary, fallopian tube and broad ligament: Secondary | ICD-10-CM

## 2014-05-12 DIAGNOSIS — N839 Noninflammatory disorder of ovary, fallopian tube and broad ligament, unspecified: Secondary | ICD-10-CM

## 2014-05-12 NOTE — Progress Notes (Signed)
Abdominal Ultrasound scheduled for patient for here in our office. Patient advised to drink water prior by Dr. Sabra Heck. Patient verbalized understanding of the [facility] appointment cancellation policy. Advised will need to cancel or reschedule within 72 business hours of appointment (3 business days) or will have $100.00 late cancellation fee placed to account.

## 2014-05-13 ENCOUNTER — Telehealth: Payer: Self-pay | Admitting: Obstetrics & Gynecology

## 2014-05-13 NOTE — Telephone Encounter (Signed)
Spoke with patient. Advised that per benefit quote received, she will be responsible for a $140 copay when she comes in for PUS.  Patient agreeable.

## 2014-05-21 ENCOUNTER — Encounter: Payer: Self-pay | Admitting: Obstetrics & Gynecology

## 2014-05-21 NOTE — Progress Notes (Signed)
Patient ID: Sarah Underwood, female   DOB: 1945-09-11, 69 y.o.   MRN: [id number]   Very nice 69 yo G1P1 DWF here to discuss several issues and pt's desire to proceed with possible hysterectomy and/or oophorectomy.  Pt with hx of Breast cancer 3/95 which was stage 2b.  Pt treated with left mastectomy and had no + lymph nodes.  Pt then had local recurrent at left chest wall.  This was treated with excision and then chest wall radiation.  Pt has been BRCA tested, which was negative.  Pt's mother was diagnosed at age 70 with breast and died age 39.  Sister was diagnosed age 75 and is currently alive.  Pt has known solid lesion on right ovary.  This has been stable and followed with pelvic US.  Pt has such significant vaginal atrophy that this is becoming unbearable.  She doesn't want to stop watching and really would like to do something definitive for treatment.  H/O some type of ovary surgery that was done through a large Pfannenstiel incision.  Pt reports surgeon was an alcoholic and she didn't know that.  Feels like she didn't get very good care with him and really has no idea what kind of surgery was done.    D/W pt I just don't feel I have indicate to proceed with hysterectomy at this time.  However, if solid lesion has increased in size, then could proceed with oophorectomy.  Pt has questions about uterus and cervix and possibility for future cancers.  Her cervical cancer risk of very small as has never had an abnormal pap smear and not currently sexually active.  Endometrial cancer risk would be the same as the general population. But there are often symptoms like vaginal bleeding that help with earlier detection.  Pt voices understanding but still would like hysterectomy.  We agreed to proceed with PUS and then discuss further.  Assessment:  H/O breast cancer but BRCA negative H/O solid ovarian mass H/O atrophic vaginitis and significant pain with vaginal PUS  Plan:  Pt will return for abd PUS only,  no transvaginal and findings will be discussed with pt after PUS is completed.  ~30 minutes spent with patient >50% of time was in face to face discussion of above.

## 2014-05-28 ENCOUNTER — Ambulatory Visit (INDEPENDENT_AMBULATORY_CARE_PROVIDER_SITE_OTHER): Payer: Medicare Other | Admitting: Obstetrics & Gynecology

## 2014-05-28 ENCOUNTER — Ambulatory Visit: Payer: Medicare Other

## 2014-05-28 ENCOUNTER — Ambulatory Visit (INDEPENDENT_AMBULATORY_CARE_PROVIDER_SITE_OTHER): Payer: Medicare Other

## 2014-05-28 ENCOUNTER — Other Ambulatory Visit: Payer: Medicare Other | Admitting: Obstetrics & Gynecology

## 2014-05-28 VITALS — BP 162/90 | Ht 63.75 in

## 2014-05-28 DIAGNOSIS — D279 Benign neoplasm of unspecified ovary: Secondary | ICD-10-CM

## 2014-05-28 DIAGNOSIS — N839 Noninflammatory disorder of ovary, fallopian tube and broad ligament, unspecified: Secondary | ICD-10-CM

## 2014-05-28 NOTE — Progress Notes (Signed)
69 y.o.G1P1 White Divorcedfemale here for a pelvic ultrasound to assess ovaries.  Pt with hx of breast cancer 3/95.  Mother was hx of breast cancer as well as sister.  Pt has lots of concerns about ovarian cancer.  Pt with hx of small solid lesion on ovary and had repeat ultrasounds in the past.  There has never been any change in this.  Pt and I recently discussed indications for hysterectomy, as this is her preference, but at this time there really isn't an indication for this.  Pt reports having a friend with recent cancer has caused her to feel more worried about this but upon some additional reflection after our discussion, she really isn't as worried as she was in the past.  Also, pt really dislikes vaginal exams and they cause her a lot of pain but was reassured that we could always try and look transabdominally to see ovaries.  Ca-125's can be done as well and she understands the weakness in this test but also it's strengths.  Planning on repeating this today. Pt does have hx of laparotomy done with partial excision of ovary or cyst. She is not completley sure but does have significant abdominal scar from this surgery.   Patient's last menstrual period was 04/25/1999.  Sexually active:  yes  Contraception: PMP  FINDINGS: UTERUS: 4.8 x 4.0 x 2.0cm EMS: 2.7mm ADNEXA:   Left ovary 2.6 x 1.4 x 1.2cm   Right ovary 2.6 x 2.2 x 2.0cm with possible solid componentt 26 x 22 x 25mm.  Difficult to clearly determine CUL DE SAC: no free fluid  Technically difficult examination due to transabdominal approach.    Discussed results with pt.  Possible increase in size of solid ovarian mass.  However, this has been seen for several years and if it were an ovarian cancer, it would be much larger and more worrisome in appearance.  Plan to check ca-125 today.  Pt comfortable with results.  D/w pt future testing.  She does not want to stop this and would like to repeat in 2 years.  As there are no "screening  guidelines", I feel this is reasonable.  D/W pt signs/symptoms of ovarian cancer.  Pt knows to call with any changes for repeat exam and/or ultrasound  Assessment:  Solid (?) 2.6cm lesion with difficult exam today due to transabdominal approach  Plan:  Ca-125.  If normal, plan to repeat PUS and ca-125 2 years.  Will not proceed with any surgery at this time.  Pt very comfortable with plan.  ~15 minutes spent with patient >50% of time was in face to face discussion of above.

## 2014-05-29 LAB — CA 125: CA 125: 4 U/mL (ref <35)

## 2014-06-02 ENCOUNTER — Telehealth: Payer: Self-pay

## 2014-06-02 NOTE — Telephone Encounter (Signed)
Lmtcb//kn 

## 2014-06-02 NOTE — Telephone Encounter (Signed)
-----   Message from Lyman Speller, MD sent at 06/02/2014  7:24 AM EST ----- Inform pt this is normal.  We have decided not to proceed with surgery.

## 2014-06-05 ENCOUNTER — Encounter: Payer: Self-pay | Admitting: Obstetrics & Gynecology

## 2014-06-16 NOTE — Telephone Encounter (Signed)
Patient notified of results.//kn 

## 2014-11-24 ENCOUNTER — Other Ambulatory Visit: Payer: Self-pay | Admitting: Oncology

## 2014-11-24 DIAGNOSIS — C50919 Malignant neoplasm of unspecified site of unspecified female breast: Secondary | ICD-10-CM

## 2014-12-14 ENCOUNTER — Ambulatory Visit
Admission: RE | Admit: 2014-12-14 | Discharge: 2014-12-14 | Disposition: A | Discharge status: Home / Self Care | Payer: Medicare Other | Source: Ambulatory Visit | Attending: Family Medicine | Admitting: Family Medicine

## 2014-12-14 ENCOUNTER — Encounter: Payer: Self-pay | Admitting: Family Medicine

## 2014-12-14 ENCOUNTER — Other Ambulatory Visit: Payer: Self-pay | Admitting: Family Medicine

## 2014-12-14 ENCOUNTER — Ambulatory Visit (INDEPENDENT_AMBULATORY_CARE_PROVIDER_SITE_OTHER): Payer: Medicare Other | Admitting: Family Medicine

## 2014-12-14 VITALS — BP 156/73 | HR 74 | Ht 63.0 in | Wt 175.0 lb

## 2014-12-14 DIAGNOSIS — M25552 Pain in left hip: Secondary | ICD-10-CM

## 2014-12-14 DIAGNOSIS — M25562 Pain in left knee: Secondary | ICD-10-CM

## 2014-12-14 DIAGNOSIS — M25561 Pain in right knee: Secondary | ICD-10-CM

## 2014-12-14 DIAGNOSIS — M25551 Pain in right hip: Secondary | ICD-10-CM

## 2014-12-14 HISTORY — DX: Pain in right knee: M25.561

## 2014-12-14 IMAGING — CR DG KNEE STANDING AP BILAT
1 series · 1 of 1 positions shown · non-contrast
Comparison: [DATE]

CLINICAL DATA: Chronic knee pain right greater than left

EXAM:
BILATERAL KNEES STANDING - 1 VIEW

[w knees ap bilat]
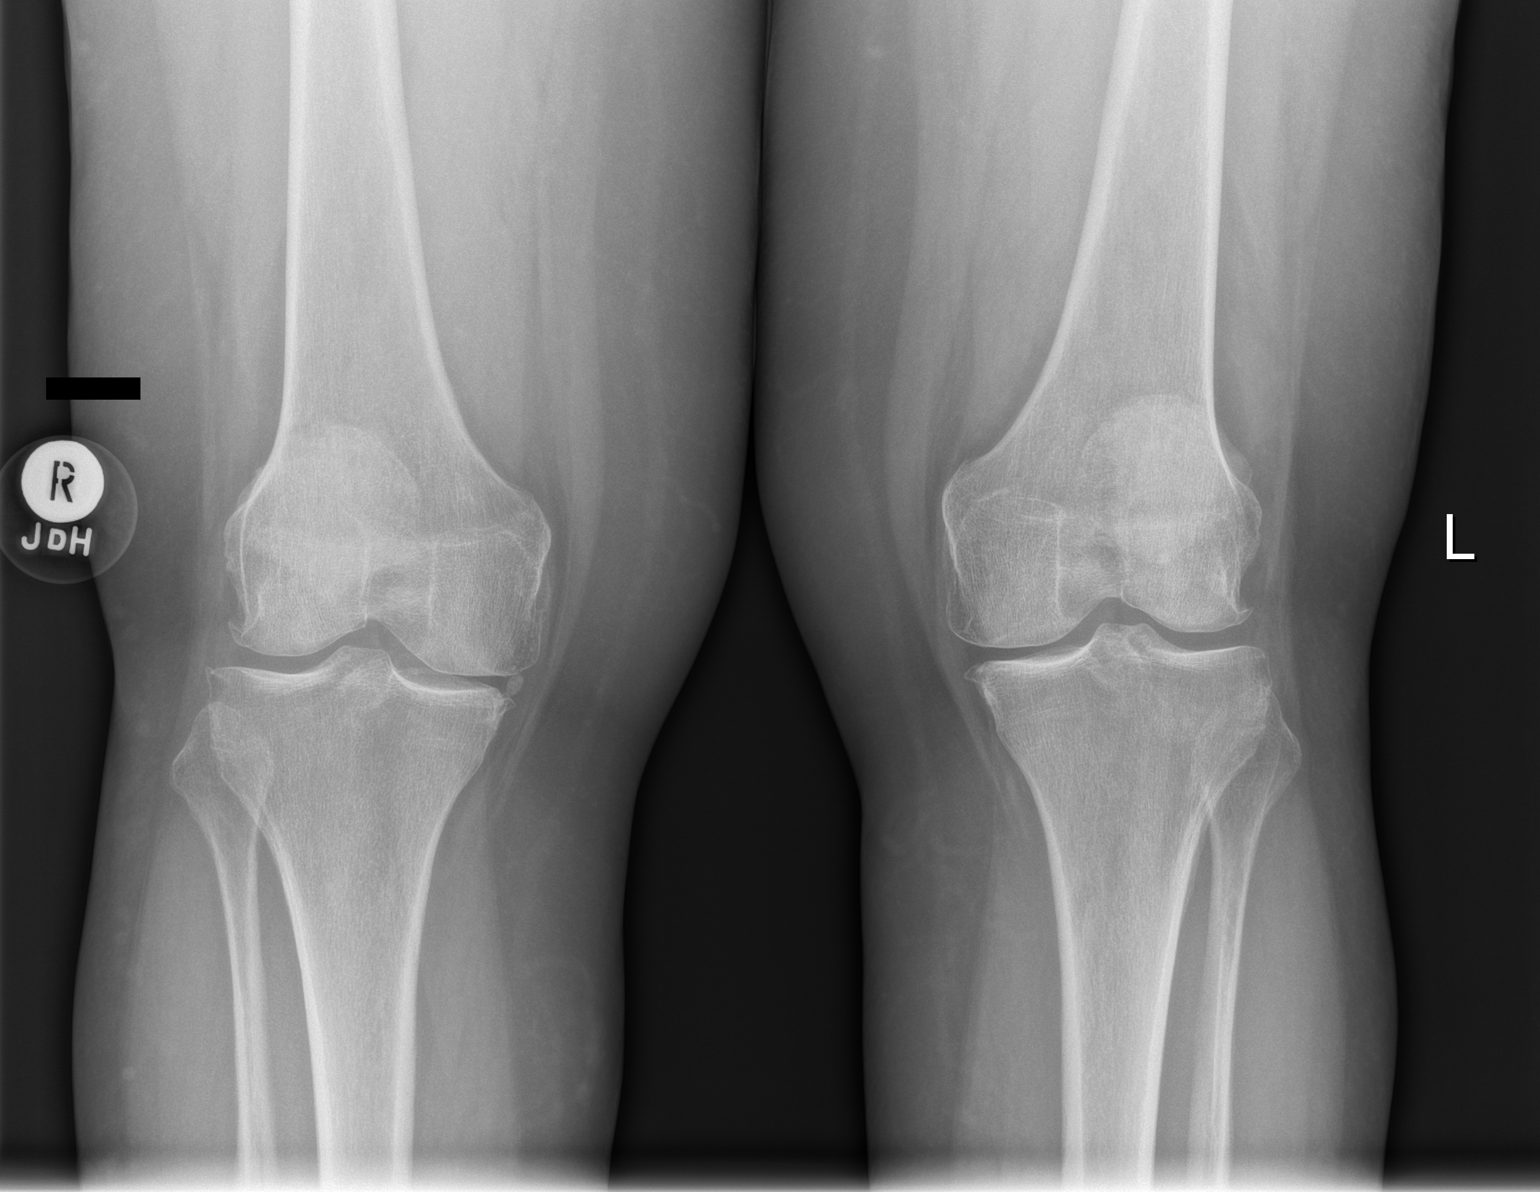

[1 of 1 positions shown; findings below may reference images not displayed]

FINDINGS: Degenerative changes are noted in both knee joints particularly in
the medial joint space. Mild osteophytic changes are seen. No acute
fracture or dislocation is noted.
IMPRESSION: Bilateral degenerative change progressed from the prior exam. No
acute abnormality is noted.

## 2014-12-14 IMAGING — CR DG HIP (WITH OR WITHOUT PELVIS) 5+V BILAT
5 series · 5 of 5 positions shown · non-contrast
Comparison: None.

CLINICAL DATA: Chronic bilateral hip pain, right more than left.

EXAM:
DG HIP (WITH OR WITHOUT PELVIS) 5+V BILAT

[w pelvis upright]
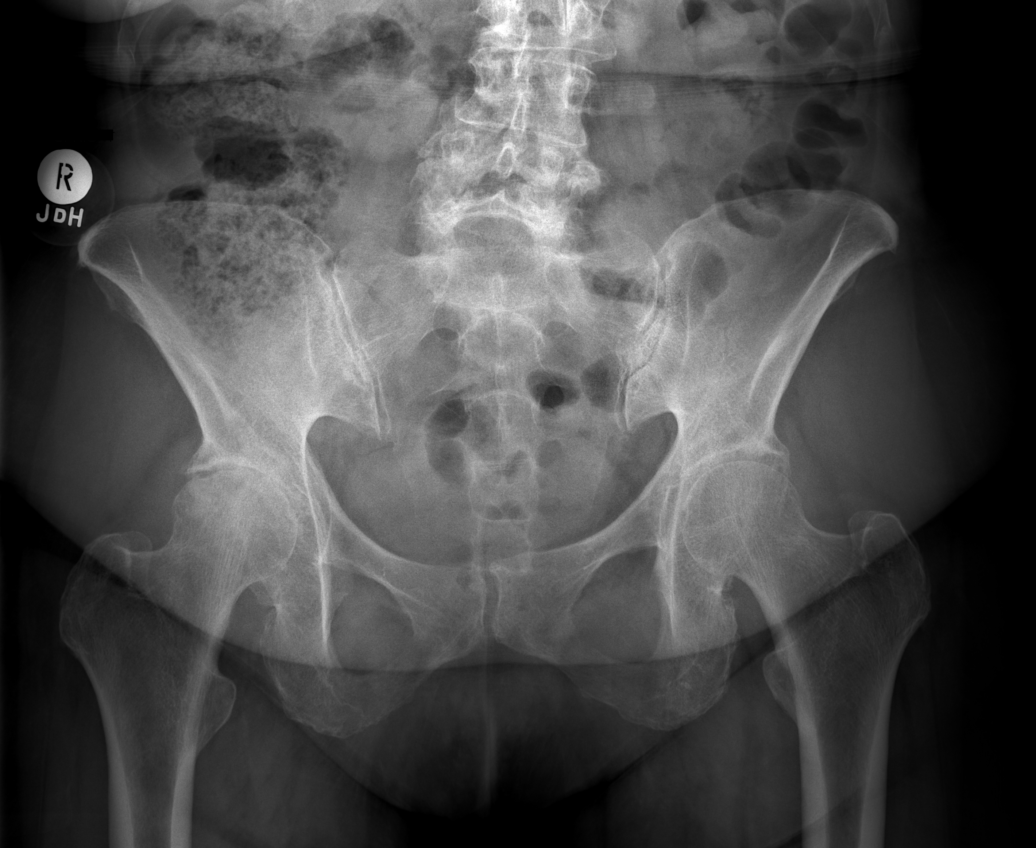

[w hip ap left]
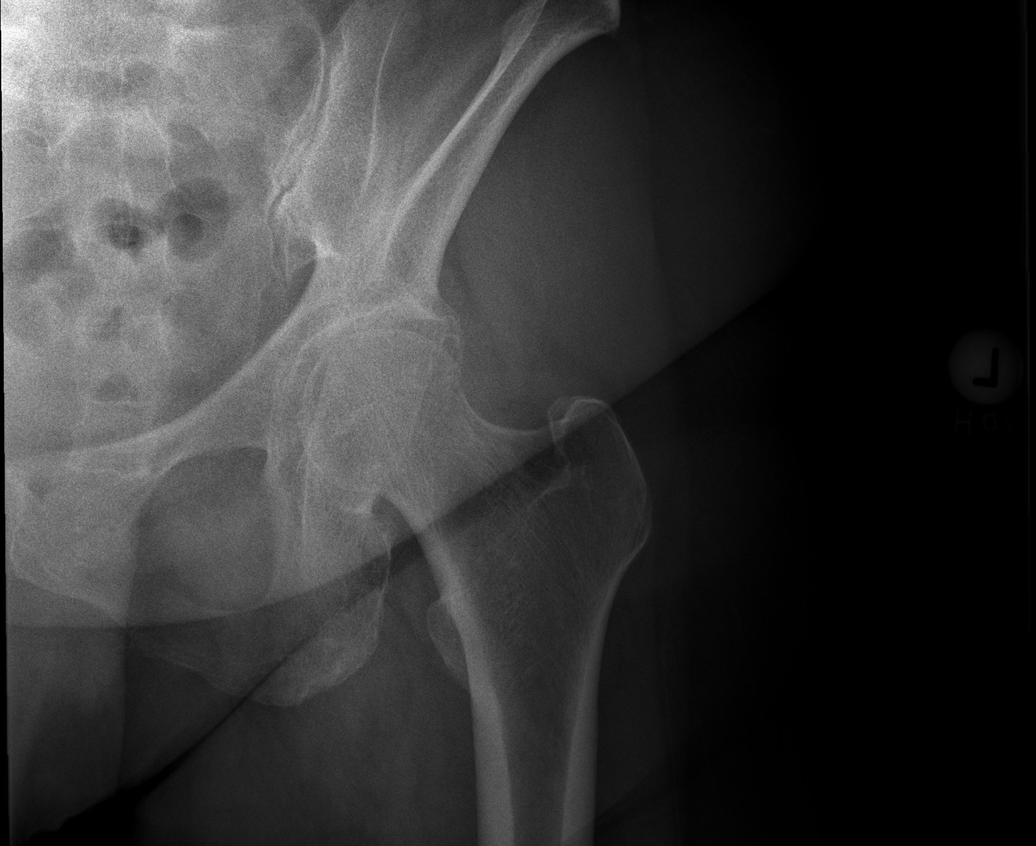

[w hip lat left]
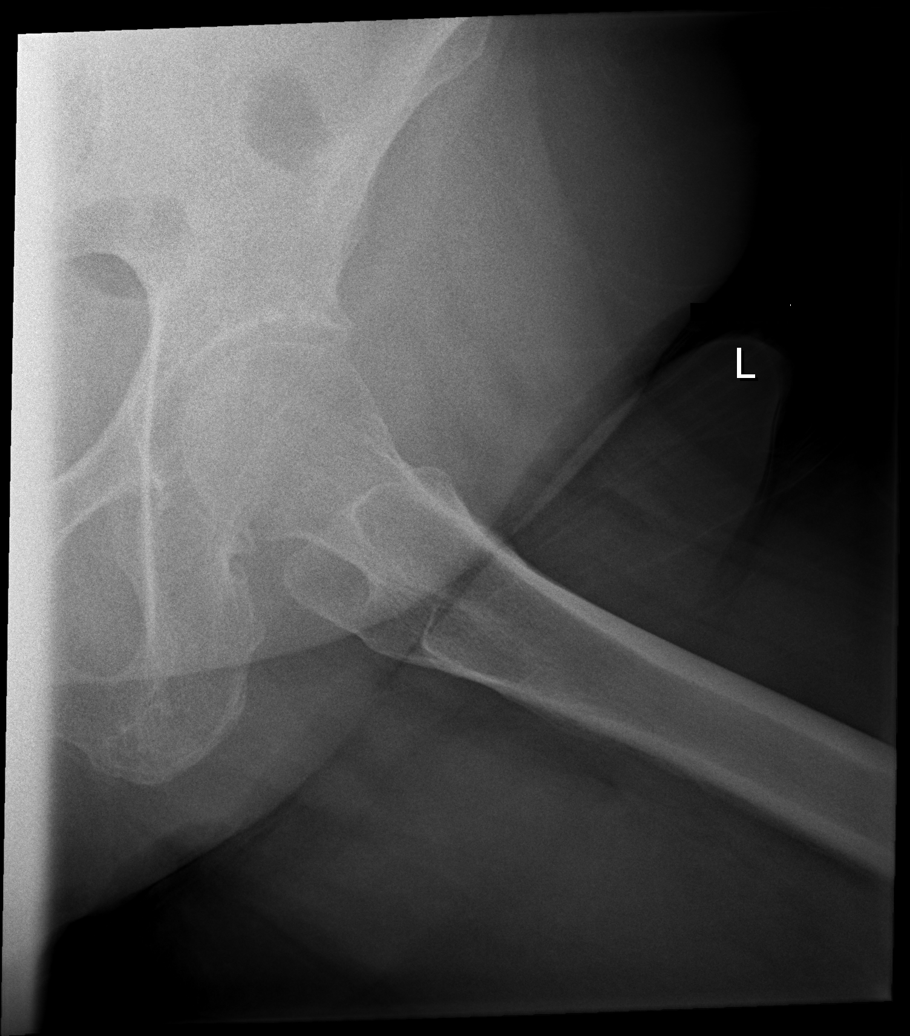

[w hip ap right]
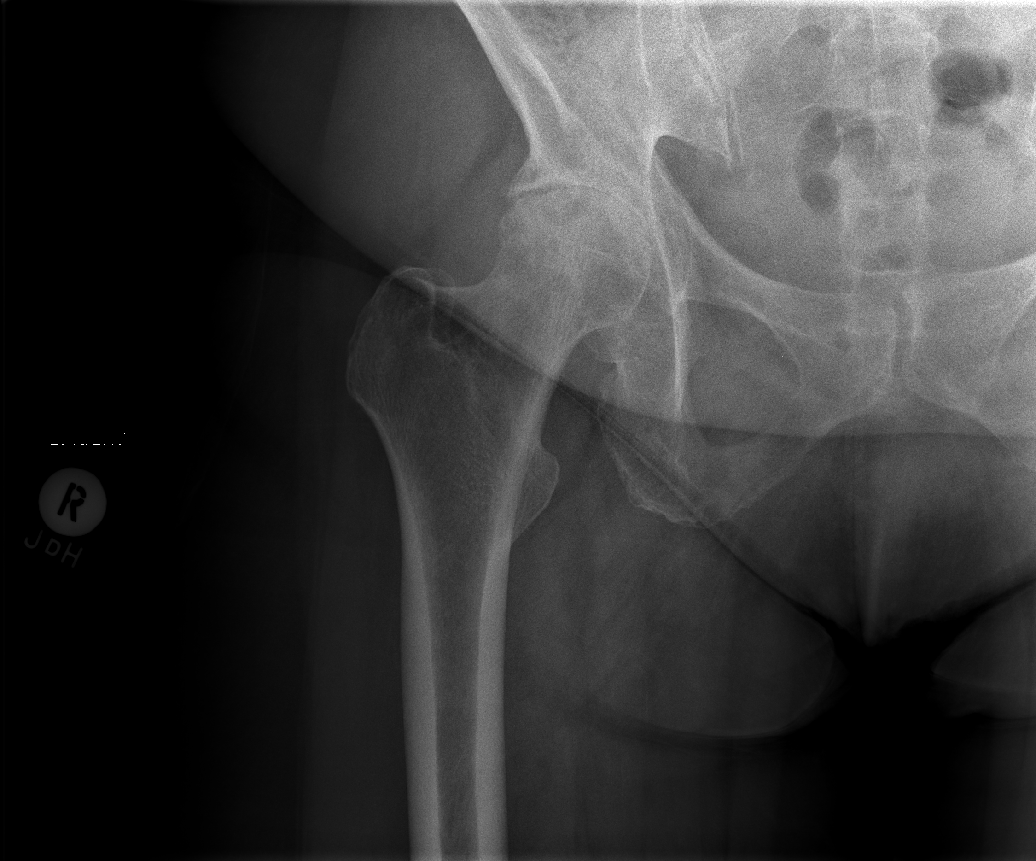

[w hip lat right]
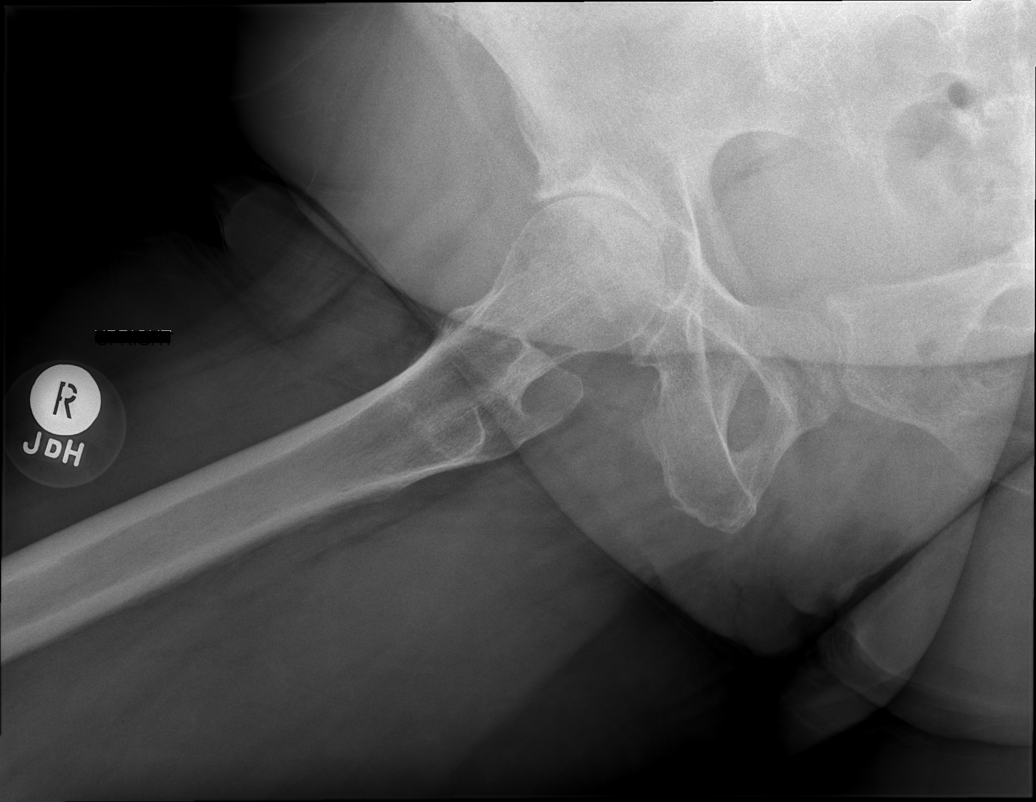

[5 of 5 positions shown; findings below may reference images not displayed]

FINDINGS: There is no evidence of hip fracture or dislocation. There are
moderate to severe osteoarthritic changes of the right hip with
narrowing of the joint space, subchondral sclerosis, remodeling of
the acetabulum and small osteophyte formation. Mild osteoarthritic
changes noted of the left hip. There also are partially visualized
osteoarthritic changes of the lower lumbosacral spine at L4-L5 and
L5-S1. Soft tissues are grossly normal.
IMPRESSION: Severe osteoarthritic changes of the right hip.

Mild osteoarthritic changes of the left hip.

Moderate to severe osteoarthritic changes of the lower lumbosacral
spine, partially visualized.

## 2014-12-14 NOTE — Progress Notes (Signed)
Patient ID: Sarah Underwood, female   DOB: Aug 03, 1945, 69 y.o.   MRN: [id number]  CC: Pain on hip, back, groin, b/l knees  HPI:  69 yo lady with PMH of breast cancer here for Pain on hip, back, groin, b/l knees R Hip and groin pain:  Located in lateral hip shooting down to groin, sharp pain Worse after she walks for about five minutes or goes up stairs Sometimes radiates to buttock or low back Stretching, massage, PT has helped Was taking ibuprofen and tylenol in large amounts, now has stomach ulcer No red flag symptoms (numbness, tingling, incontinence, weakness) Says her daughter is worried about this because of her history of breast cancer (concern for bony mets)  Knee pain:  Pain in b/l knees, right lateral, left global Worse with walking  Left knee has been swelling off an on, not painful or red with swelling Sharp pain b/l Was seeing a PT for knee pain, she has worked on knee pain, IT band, and psoas rehab  BP 156/73 mmHg  Pulse 74  Ht 5\' 3"  (1.6 m)  Wt 175 lb (79.379 kg)  BMI 31.01 kg/m2  LMP 04/25/1999 Physical Exam  Constitutional: She is oriented to person, place, and time and well-developed, well-nourished, and in no distress.  HENT:  Head: Normocephalic and atraumatic.  Pulmonary/Chest: Effort normal.  Musculoskeletal:  Hip: Normal appearing hips and alignment TTP on trochanteric bursae, SI joint, gluteus over piriformis, IT band. No ttp over groin, lumbar paraspinals. Strength: 5/5 flexion and extension b/l. Pain with adduction. No pain with abduction.  SLR negative. Pain in hip/groin with FADIR. Pain in SI region with FABER. Reduced ROM with both FABER and FADIR. Pain with log roll test. Thompson's test difficult to evaluate due to pt's knee pain.  Knee:  Knees large in appearance with the left knee larger than right. +bony prominences on knees. No ttp on b/l knees anywhere.  5/5 strength with flexion and extension.  Crepitus on b/l knees on flexion and  extension.  No laxity on anterior drawer, lachman, varus or valgus stress. No painful pop on McMurray.     Neurological: She is alert and oriented to person, place, and time. No cranial nerve deficit. Gait normal.  Skin: Skin is warm and dry. No rash noted. No erythema.  Psychiatric: Affect and judgment normal.    Assessment:  69 yo lady with PMH of breast cancer here for Pain on hip, back, groin, b/l knees. For her hip and groin pain, she has multiple possible contributing factors given her ttp over the trochanteric bursae, SI joint, and piriformis. For tests, her FABER, FADIR, and log roll tests were positive. She likely has intraarticular osteoarthritis and possibly has some SI joint pathology, trochanteric bursitis, or piriformis inflammation contributing.   For her knee pain, her knees have the classic appearance of OA with bony prominences and crepitus on exam. No laxity or cartilage popping was felt on exam, making ligamentous or cartilage injury less likely. No erythema or joint warmth that would suggest gout or septic joint.  She has not had imaging done for any of these issues.   PLAN:  Will get xrays today of b/l hips and pelvis, along with b/l standing knees.  Will call pt with recommendations pending her xray results.  Pt is already attending PT so will allow her to continue to do so Will recommend avoiding NSAIDS at this time given her recent GI issues.   Waynard Reeds, MD This pt seen and evaluated  with Dr. Nori Riis.

## 2014-12-15 ENCOUNTER — Telehealth: Payer: Self-pay | Admitting: Family Medicine

## 2014-12-15 ENCOUNTER — Encounter: Payer: Self-pay | Admitting: Family Medicine

## 2014-12-15 NOTE — Telephone Encounter (Signed)
[phone] Sarah Underwood) Rhea Can u call her and tell her: X rays show mild arthrotis left hip and bothe knees--expected for age. Nothing really needs to be done. RIGHT HIP however is moderate arthritis--IO think this IS the cause of her pain. MAy stay the way ot is for a while, MAY get worse. If worse, we could consider injection but  Unknown if that woud make a big difference. If symptoms get really bad at some point, I would rec eval for hip replacement. Letter in mail with x ray results

## 2014-12-15 NOTE — Telephone Encounter (Signed)
RHEA Im sorry--arthritis in [place] is MODERATE TO SEVERE THANKS! Dorcas Mcmurray

## 2014-12-15 NOTE — Progress Notes (Signed)
Patient ID: Sarah Underwood, female   DOB: Nov 04, 1945, 69 y.o.   MRN: [id number] [place] Attending Note: I have seen and examined this patient. I have discussed this patient with the resident and reviewed the assessment and plan as documented above. I agree with the resident's findings and plan. I suspect she has some OA hip joint. Gait is pretty normal but her knee pain is likely OA as well.Nothing about her exam or clinical picture worries me for bony mets and I think this is both her, and her daughter's, biggest concern. Will get some imaging and let her know details. She seemed very reassured about this and does not really want to pursue any pain meds or further PT etc--I think her main concern was that her pain might be related to her hx of breat cancer.

## 2015-02-14 ENCOUNTER — Telehealth: Payer: Self-pay | Admitting: Oncology

## 2015-02-14 NOTE — Telephone Encounter (Signed)
s.w. pt and advised on 11.10 appt moved to 11.9 per md on pal..Marland KitchenMarland KitchenMarland Kitchenpt ok and aware

## 2015-02-23 ENCOUNTER — Other Ambulatory Visit: Payer: Self-pay | Admitting: Oncology

## 2015-03-03 ENCOUNTER — Telehealth: Payer: Self-pay | Admitting: Oncology

## 2015-03-03 ENCOUNTER — Ambulatory Visit (HOSPITAL_BASED_OUTPATIENT_CLINIC_OR_DEPARTMENT_OTHER): Payer: Medicare Other | Admitting: Oncology

## 2015-03-03 VITALS — BP 152/94 | HR 75 | Temp 98.3°F | Resp 18 | Ht 63.0 in | Wt 171.3 lb

## 2015-03-03 DIAGNOSIS — M1990 Unspecified osteoarthritis, unspecified site: Secondary | ICD-10-CM

## 2015-03-03 DIAGNOSIS — C50912 Malignant neoplasm of unspecified site of left female breast: Secondary | ICD-10-CM

## 2015-03-03 DIAGNOSIS — C44501 Unspecified malignant neoplasm of skin of breast: Secondary | ICD-10-CM | POA: Diagnosis not present

## 2015-03-03 NOTE — Progress Notes (Signed)
  [facility] OFFICE PROGRESS NOTE   Diagnosis: Breast cancer    INTERVAL HISTORY:   [name] returns as scheduled. She continues Arimidex. Stable hot flashes and arthralgias. He has increased pain at the right hip and left knee. She reports being diagnosed with arthritis of the right hip and is considering a hip replacement procedure with Dr. Wynelle Link. She feels "thickening "at the upper medial right breast. She had a mammogram at [facility] in August (CTs we do not have the report).  Objective:  Vital signs in last 24 hours:  Blood pressure 177/75, pulse 75, temperature 98.3 F (36.8 C), temperature source Oral, resp. rate 18, height 5\' 3"  (1.6 m), weight 171 lb 4.8 oz (77.701 kg), last menstrual period 04/25/1999, SpO2 99 %.    HEENT: Neck without mass Lymphatics: No cervical, supra-clavicular, or axillary nodes Resp: Lungs clear bilaterally Cardio: Regular rate and rhythm GI: No hepatomegaly Vascular: No leg edema Breast: No mass in the right breast. Status post left mastectomy with radiation telangiectasias at the left anterior chest wall. No chest wall nodules. Irregular scar tissue superior to the lateral aspect of the mastectomy scar and in the left axilla Musculoskeletal: No spine tenderness, pain with motion at the right hip Skin: Mild flat erythematous rash at the right lateral breast and right upper inner breast-no nodularity   Medications: I have reviewed the patient's current medications.  Assessment/Plan:  Ms. [place] was diagnosed with left-sided breast cancer in 1995. She was diagnosed with a chest wall recurrence in October 2001. She continues indefinite Arimidex. There is no evidence of disease progression.  We will follow-up on the recent right mammogram report. She requests a right breast ultrasound to evaluate the area of "thickening "at the upper inner right breast. I cannot appreciate this on exam today.  She will continue follow-up with  orthopedics for evaluation of the right hip arthritis. I have a low clinical suspicion for metastatic breast cancer.   Disposition:  She will return for an office visit in 6 months.  Betsy Coder, MD  03/03/2015  1:03 PM

## 2015-03-03 NOTE — Telephone Encounter (Signed)
per pof to sch pt appt-gave pt copy of avs-sch [place] @ [facility]-pt has appt time/date.location

## 2015-03-04 ENCOUNTER — Ambulatory Visit: Payer: Medicare Other | Admitting: Oncology

## 2015-03-09 ENCOUNTER — Ambulatory Visit: Payer: Medicare Other | Admitting: Nurse Practitioner

## 2015-04-05 ENCOUNTER — Other Ambulatory Visit: Payer: Self-pay | Admitting: Gastroenterology

## 2015-06-20 ENCOUNTER — Ambulatory Visit (INDEPENDENT_AMBULATORY_CARE_PROVIDER_SITE_OTHER): Payer: Medicare Other

## 2015-06-20 ENCOUNTER — Ambulatory Visit (INDEPENDENT_AMBULATORY_CARE_PROVIDER_SITE_OTHER): Payer: Medicare Other | Admitting: Emergency Medicine

## 2015-06-20 VITALS — BP 130/78 | HR 77 | Temp 99.3°F | Resp 14 | Ht 64.0 in | Wt 169.0 lb

## 2015-06-20 DIAGNOSIS — R059 Cough, unspecified: Secondary | ICD-10-CM

## 2015-06-20 DIAGNOSIS — J209 Acute bronchitis, unspecified: Secondary | ICD-10-CM

## 2015-06-20 DIAGNOSIS — R05 Cough: Secondary | ICD-10-CM | POA: Diagnosis not present

## 2015-06-20 DIAGNOSIS — R062 Wheezing: Secondary | ICD-10-CM

## 2015-06-20 LAB — POCT INFLUENZA A/B
INFLUENZA B, POC: NEGATIVE
Influenza A, POC: NEGATIVE

## 2015-06-20 LAB — POCT CBC
Granulocyte percent: 66.6 %G (ref 37–80)
HEMATOCRIT: 35.7 % — AB (ref 37.7–47.9)
Hemoglobin: 12.6 g/dL (ref 12.2–16.2)
Lymph, poc: 1.9 (ref 0.6–3.4)
MCH: 30.3 pg (ref 27–31.2)
MCHC: 35.4 g/dL (ref 31.8–35.4)
MCV: 85.7 fL (ref 80–97)
MID (cbc): 0.8 (ref 0–0.9)
MPV: 6.4 fL (ref 0–99.8)
PLATELET COUNT, POC: 263 10*3/uL (ref 142–424)
POC Granulocyte: 5.3 (ref 2–6.9)
POC LYMPH PERCENT: 23.7 %L (ref 10–50)
POC MID %: 9.7 %M (ref 0–12)
RBC: 4.17 M/uL (ref 4.04–5.48)
RDW, POC: 13.7 %
WBC: 7.9 10*3/uL (ref 4.6–10.2)

## 2015-06-20 IMAGING — CR DG CHEST 2V
2 series · 2 of 2 positions shown · non-contrast
Comparison: Chest radiograph [DATE].

CLINICAL DATA: Patient with cough and wheezing.

EXAM:
CHEST  2 VIEW

[PA]
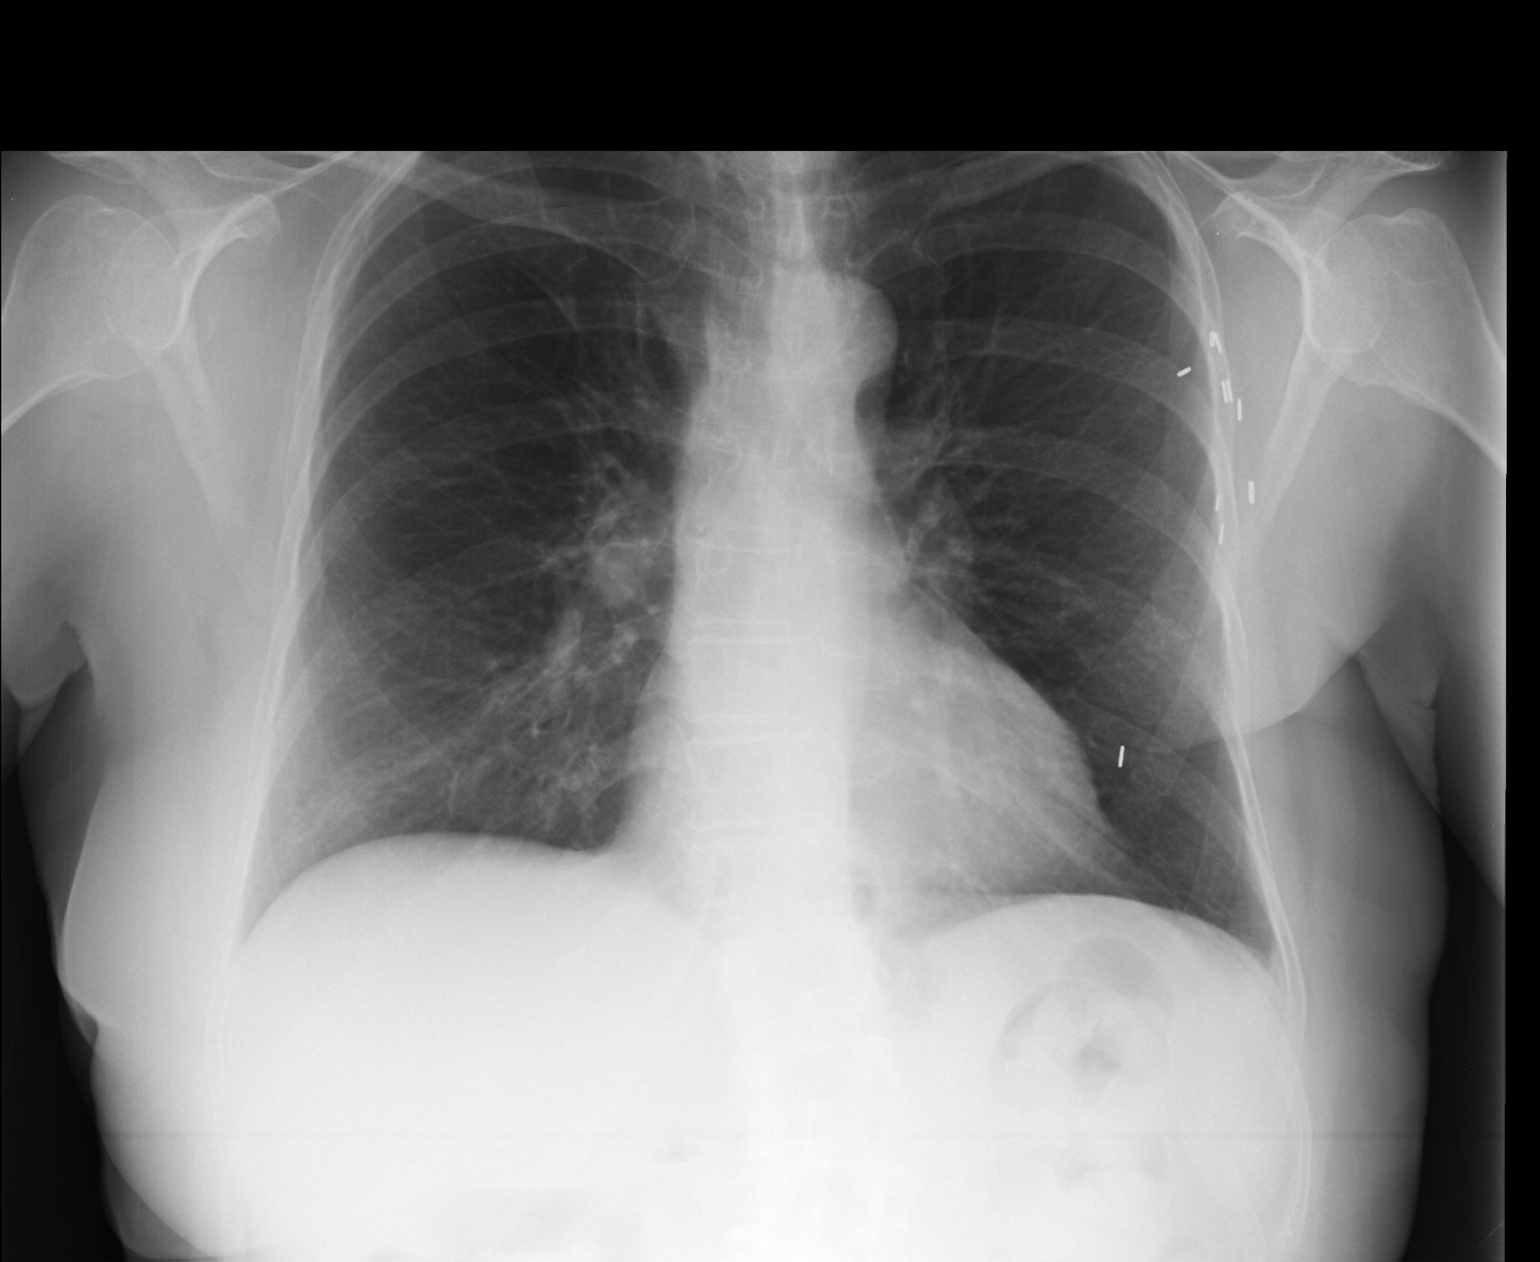

[lateral]
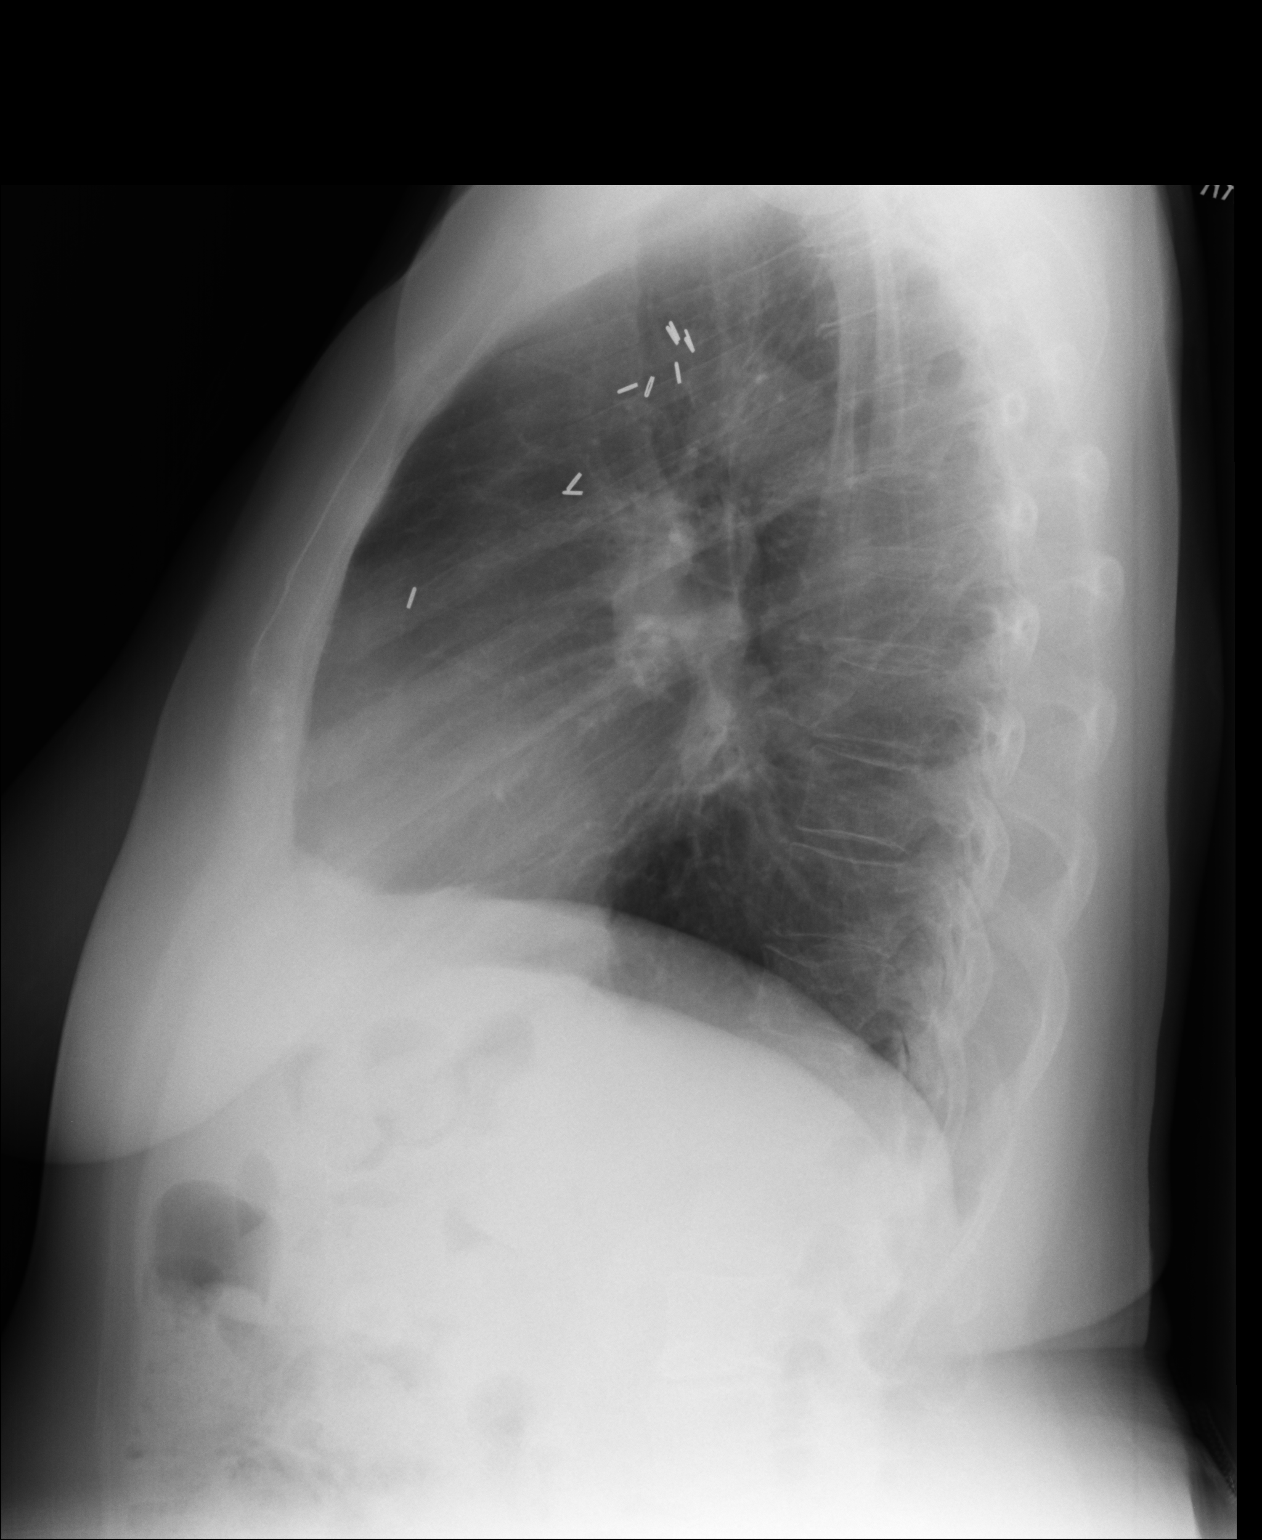

[2 of 2 positions shown; findings below may reference images not displayed]

FINDINGS: Stable cardiac mediastinal contours. No consolidative pulmonary
opacities. No pleural effusion or pneumothorax. Left chest wall
surgical clips. Unremarkable visualized osseous structures.
IMPRESSION: No active cardiopulmonary disease.

## 2015-06-20 MED ORDER — PREDNISONE 10 MG PO TABS: ORAL_TABLET | ORAL | Status: DC

## 2015-06-20 MED ORDER — ALBUTEROL SULFATE HFA 108 (90 BASE) MCG/ACT IN AERS: 2.0000 | INHALATION_SPRAY | RESPIRATORY_TRACT | Status: DC | PRN

## 2015-06-20 MED ORDER — ALBUTEROL SULFATE (2.5 MG/3ML) 0.083% IN NEBU
2.5000 mg | INHALATION_SOLUTION | Freq: Once | RESPIRATORY_TRACT | Status: AC
Start: 2015-06-20 — End: 2015-06-20
  Administered 2015-06-20: 2.5 mg via RESPIRATORY_TRACT

## 2015-06-20 MED ORDER — IPRATROPIUM BROMIDE 0.02 % IN SOLN
0.5000 mg | Freq: Once | RESPIRATORY_TRACT | Status: AC
  Administered 2015-06-20: 0.5 mg via RESPIRATORY_TRACT

## 2015-06-20 MED ORDER — DOXYCYCLINE HYCLATE 100 MG PO CAPS: 100.0000 mg | ORAL_CAPSULE | Freq: Two times a day (BID) | ORAL | Status: DC

## 2015-06-20 MED ORDER — BENZONATATE 100 MG PO CAPS: 100.0000 mg | ORAL_CAPSULE | Freq: Three times a day (TID) | ORAL | Status: DC | PRN

## 2015-06-20 NOTE — Patient Instructions (Signed)
Because you received an x-ray today, you will receive an invoice from [facility]. Please contact [facility] at [phone] with questions or concerns regarding your invoice. Our billing staff will not be able to assist you with those questions. °

## 2015-06-20 NOTE — Progress Notes (Signed)
Chief Complaint:  Chief Complaint  Patient presents with  . Cough    Since Wednesday  . Nasal Congestion  . chest cold    Wednesday    HPI: Sarah Underwood is a 70 y.o. female who is here for with a persistent cough and wheezing in her chest. She actually became ill about 8 or 9 days ago. She also has traveled to [place] and got back yesterday to [place]. If she tries to walk any distance she has a wheezing tightness in her chest. She has been using her albuterol inhaler all week with some improvement. She has a history of a similar illness 6 years ago where she developed severe bronchospasm and wheezing.  Past Medical History  Diagnosis Date  . Graves disease 2007    thyroid nodule  . Ovarian mass     small solid nodule 8x31mm: Right ovary - stable since 2000  . Breast cancer ([place]) 1995 & 2001    left-left mastectomy, left chest wall recurrence, radiation x 7 weeks  . Basal cell carcinoma 1994    right side of node  . Complication of anesthesia     slow waking up  . Seasonal allergies   . Skin cancer     basal cell- nose   Past Surgical History  Procedure Laterality Date  . Tonsillectomy and adenoidectomy  1950  . Mastectomy Left 3/95    Stage IIb 0/14 LN ER/ PR + with mastectomy - Dr. Mendel Ryder  . Breast biopsy Right 1987    benign fibroadenoma  . Colposcopy w/ biopsy / curettage  03/12/12    benign, but + HR HPV  . Breast cancer recurrence Left 01/2000    at site of left chest wall after previous mastectomy with removal and treatment with radiaton.  . Ovary surgery  1974    lt  . Carpal tunnel release Right 05/08/2013    Procedure: RIGHT CARPAL TUNNEL RELEASE;  Surgeon: Cammie Sickle., MD;  Location: [facility];  Service: Orthopedics;  Laterality: Right;  . Basal cell removal  7/94    right nose  . Cataract extraction      03/20/14 left eye, 12/15 right eye   Social History   Social History  . Marital Status: Divorced   Spouse Name: N/A  . Number of Children: 1  . Years of Education: N/A   Occupational History  .  [employer] History Main Topics  . Smoking status: Never Smoker   . Smokeless tobacco: Never Used  . Alcohol Use: No     Comment: rare-wine  . Drug Use: No  . Sexual Activity: No   Other Topics Concern  . None   Social History Narrative   Family History  Problem Relation Age of Onset  . Breast cancer Mother 9    deceased 39  . Osteoporosis Mother   . Hypertension Mother   . Hypertension Father   . Heart attack Father   . Breast cancer Sister 12    bilateral; currently 91  . Diabetes Brother   . Lung cancer Maternal Grandfather     deceased 49  . Cancer Paternal Grandmother     duodenal; deceased 71   Allergies  Allergen Reactions  . Erythromycin Nausea And Vomiting  . Penicillins Hives  . Sulfa Antibiotics Rash   Prior to Admission medications   Medication Sig Start Date End Date Taking? Authorizing Provider  Acetaminophen (TYLENOL  8 HOUR ARTHRITIS PAIN PO) Take by mouth.   Yes Historical Provider, MD  albuterol (PROVENTIL HFA;VENTOLIN HFA) 108 (90 BASE) MCG/ACT inhaler Inhale 2 puffs into the lungs every 4 (four) hours as needed for wheezing. [id number]  Yes Beatriz Chancellor, PA-C  amLODipine (NORVASC) 10 MG tablet Take 10 mg by mouth daily.   Yes Historical Provider, MD  anastrozole (ARIMIDEX) 1 MG tablet Take 1 tablet by mouth  daily 02/23/15  Yes Ladell Pier, MD  calcium gluconate 500 MG tablet Take 1 tablet (500 mg total) by mouth daily. 01/11/12  Yes Ladell Pier, MD  Cholecalciferol (VITAMIN D-3 PO) Take 1,000 Units by mouth daily.    Yes Historical Provider, MD  ibuprofen (ADVIL,MOTRIN) 200 MG tablet Take 200 mg by mouth every 6 (six) hours as needed.   Yes Historical Provider, MD  methocarbamol (ROBAXIN) 500 MG tablet Take 500 mg by mouth 2 (two) times daily as needed. 02/24/15  Yes Historical Provider, MD  Multiple Vitamins-Minerals  (MULTIVITAL PO) Take 1 tablet by mouth daily.   Yes Historical Provider, MD  omeprazole (PRILOSEC) 20 MG capsule  04/09/14  Yes Historical Provider, MD     ROS: The patient denies fevers, chills, night sweats, unintentional weight loss, chest pain, palpitations,nausea, vomiting, abdominal pain, dysuria, hematuria, melena, numbness, weakness, or tingling.   All other systems have been reviewed and were otherwise negative with the exception of those mentioned in the HPI and as above.    PHYSICAL EXAM: Filed Vitals:   06/20/15 1110  BP: 130/78  Pulse: 77  Temp: 99.3 F (37.4 C)  Resp: 14   Filed Vitals:   06/20/15 1110  Height: 5\' 4"  (1.626 m)  Weight: 169 lb (76.658 kg)   Body mass index is 28.99 kg/(m^2).   General: Alert, no acute distress HEENT:  Normocephalic, atraumatic, oropharynx patent. EOMI, PERRLA Cardiovascular:  Regular rate and rhythm, no rubs murmurs or gallops.  No Carotid bruits, radial pulse intact. No pedal edema.  Respiratory: There are inspiratory and expiratory wheezes. There are no areas of dullness. Breath sounds are symmetrical. GI: No organomegaly, abdomen is soft and non-tender, positive bowel sounds.  No masses. Skin: No rashes. Neurologic: Facial musculature symmetric. Psychiatric: Patient is appropriate throughout our interaction. Lymphatic: No cervical lymphadenopathy Musculoskeletal: Gait intact.   LABS: Results for orders placed or performed in visit on 06/20/15  POCT CBC  Result Value Ref Range   WBC 7.9 4.6 - 10.2 K/uL   Lymph, poc 1.9 0.6 - 3.4   POC LYMPH PERCENT 23.7 10 - 50 %L   MID (cbc) 0.8 0 - 0.9   POC MID % 9.7 0 - 12 %M   POC Granulocyte 5.3 2 - 6.9   Granulocyte percent 66.6 37 - 80 %G   RBC 4.17 4.04 - 5.48 M/uL   Hemoglobin 12.6 12.2 - 16.2 g/dL   HCT, POC 35.7 (A) 37.7 - 47.9 %   MCV 85.7 80 - 97 fL   MCH, POC 30.3 27 - 31.2 pg   MCHC 35.4 31.8 - 35.4 g/dL   RDW, POC 13.7 %   Platelet Count, POC 263 142 - 424 K/uL     MPV 6.4 0 - 99.8 fL   Results for orders placed or performed in visit on 06/20/15  POCT CBC  Result Value Ref Range   WBC 7.9 4.6 - 10.2 K/uL   Lymph, poc 1.9 0.6 - 3.4   POC LYMPH PERCENT 23.7 10 - 50 %L  MID (cbc) 0.8 0 - 0.9   POC MID % 9.7 0 - 12 %M   POC Granulocyte 5.3 2 - 6.9   Granulocyte percent 66.6 37 - 80 %G   RBC 4.17 4.04 - 5.48 M/uL   Hemoglobin 12.6 12.2 - 16.2 g/dL   HCT, POC 35.7 (A) 37.7 - 47.9 %   MCV 85.7 80 - 97 fL   MCH, POC 30.3 27 - 31.2 pg   MCHC 35.4 31.8 - 35.4 g/dL   RDW, POC 13.7 %   Platelet Count, POC 263 142 - 424 K/uL   MPV 6.4 0 - 99.8 fL  POCT Influenza A/B  Result Value Ref Range   Influenza A, POC Negative Negative   Influenza B, POC Negative Negative    EKG/XRAY:   Primary read interpreted by Dr. Everlene Farrier at [facility].      Dg Chest 2 View  06/20/2015  CLINICAL DATA:  Patient with cough and wheezing. EXAM: CHEST  2 VIEW COMPARISON:  Chest radiograph 08/18/2013. FINDINGS: Stable cardiac mediastinal contours. No consolidative pulmonary opacities. No pleural effusion or pneumothorax. Left chest wall surgical clips. Unremarkable visualized osseous structures. IMPRESSION: No active cardiopulmonary disease. Electronically Signed   By: Lovey Newcomer M.D.   On: 06/20/2015 14:13    ASSESSMENT/PLAN: 1. Cough  - POCT CBC - POCT Influenza A/B - DG Chest 2 View; Future - albuterol (PROVENTIL) (2.5 MG/3ML) 0.083% nebulizer solution 2.5 mg; Take 3 mLs (2.5 mg total) by nebulization once. - ipratropium (ATROVENT) nebulizer solution 0.5 mg; Take 2.5 mLs (0.5 mg total) by nebulization once.  2. Wheezing  - POCT CBC - POCT Influenza A/B - DG Chest 2 View; Future - albuterol (PROVENTIL) (2.5 MG/3ML) 0.083% nebulizer solution 2.5 mg; Take 3 mLs (2.5 mg total) by nebulization once. - ipratropium (ATROVENT) nebulizer solution 0.5 mg; Take 2.5 mLs (0.5 mg total) by nebulization once.   3. Bronchitis We'll treat with doxycycline and Tessalon Perles. She has  albuterol inhaler she will use every 4-6 hours. She was also given a short taper of prednisone to help with the cough and bronchospasm.I personally performed the services described in this documentation, which was scribed in my presence. The recorded information has been reviewed and is accurate. Gross sideeffects, risk and benefits, and alternatives of medications d/w patient. Patient is aware that all medications have potential sideeffects and we are unable to predict every sideeffect or drug-drug interaction that may occur.  @[facility] @ 06/20/2015 1:14 PM

## 2015-06-21 ENCOUNTER — Telehealth: Payer: Self-pay

## 2015-06-21 NOTE — Telephone Encounter (Signed)
Pt states she misread her directions on prednisone and took all three,please advise   Best phone for pt  Is [phone]

## 2015-06-22 NOTE — Telephone Encounter (Signed)
predniSONE (DELTASONE) 10 MG tablet [id number]     Order Details    Dose, Route, Frequency: As Directed    Dispense Quantity:  18 tablet Refills:  0 Fills Remaining:  0          Sig: Take 3 a day for 3 days 2 a day for 3 days one a day for 3 days              SPoke with pt, she states she took all three for the last lose instead of taking one pill for 3 days. Pt states she spoke with the pharmacist and she feels fine. FYI

## 2015-06-22 NOTE — Telephone Encounter (Signed)
It will not have hurt her to take all 3 - if she is completely better there is nothing that needs to be done.

## 2015-06-23 ENCOUNTER — Ambulatory Visit (INDEPENDENT_AMBULATORY_CARE_PROVIDER_SITE_OTHER): Payer: Medicare Other | Admitting: Emergency Medicine

## 2015-06-23 VITALS — BP 157/76 | HR 75 | Temp 98.7°F | Resp 17 | Ht 63.0 in | Wt 169.0 lb

## 2015-06-23 DIAGNOSIS — J209 Acute bronchitis, unspecified: Secondary | ICD-10-CM

## 2015-06-23 DIAGNOSIS — R059 Cough, unspecified: Secondary | ICD-10-CM

## 2015-06-23 DIAGNOSIS — R05 Cough: Secondary | ICD-10-CM

## 2015-06-23 DIAGNOSIS — R062 Wheezing: Secondary | ICD-10-CM | POA: Diagnosis not present

## 2015-06-23 MED ORDER — ALBUTEROL SULFATE (2.5 MG/3ML) 0.083% IN NEBU
2.5000 mg | INHALATION_SOLUTION | Freq: Once | RESPIRATORY_TRACT | Status: AC
Start: 2015-06-23 — End: 2015-06-23
  Administered 2015-06-23: 2.5 mg via RESPIRATORY_TRACT

## 2015-06-23 MED ORDER — IPRATROPIUM BROMIDE 0.02 % IN SOLN
0.5000 mg | Freq: Once | RESPIRATORY_TRACT | Status: AC
  Administered 2015-06-23: 0.5 mg via RESPIRATORY_TRACT

## 2015-06-23 NOTE — Progress Notes (Signed)
Subjective:  This chart was scribed for  Arlyss Queen MD, by Tamsen Roers, at [facility].  This patient was seen in room  14 and the patient's care was started at 12:34 PM.   Chief Complaint  Patient presents with  . Follow-up    bronchitis      Patient ID: Sarah Underwood, female    DOB: June 24, 1945, 70 y.o.   MRN: [id number]  HPI  HPI Comments: Sarah Underwood is a 70 y.o. female who presents to the [facility] for a follow up. She was seen 06-20-15 with wheezing and a chest cold after traveling to [place].  Her CBC was normal and her chest x-ray was normal. She was treated with antibiotics, cough capsules as well as an albuterol inhaler.    She has been complaint with her medication for the past three days.  She states that she is still coughing (had very little white mucous 1 time) and feels congested in her chest.  She feels like she cant get anything out and is willing to take Mucin-ex.  She is also still wheezing. She is also complaining of cramping in her legs at night. She states that she no longer has any ear aches.   Patient had a breathing test completed which said that there was so issue with her breathing but patient was told that she had COPD. Patient would like to go to a pulmonologist to be more clear about this issue.   Patient Active Problem List   Diagnosis Date Noted  . Hip pain, bilateral 12/14/2014  . Knee pain, bilateral 12/14/2014  . Skin cancer   . Postmenopausal atrophic vaginitis 02/23/2013  . Breast cancer [facility])    Past Medical History  Diagnosis Date  . Graves disease 2007    thyroid nodule  . Ovarian mass     small solid nodule 8x86mm: Right ovary - stable since 2000  . Breast cancer ([place]) 1995 & 2001    left-left mastectomy, left chest wall recurrence, radiation x 7 weeks  . Basal cell carcinoma 1994    right side of node  . Complication of anesthesia     slow waking up  . Seasonal allergies   .  Skin cancer     basal cell- nose   Past Surgical History  Procedure Laterality Date  . Tonsillectomy and adenoidectomy  1950  . Mastectomy Left 3/95    Stage IIb 0/14 LN ER/ PR + with mastectomy - Dr. Mendel Ryder  . Breast biopsy Right 1987    benign fibroadenoma  . Colposcopy w/ biopsy / curettage  03/12/12    benign, but + HR HPV  . Breast cancer recurrence Left 01/2000    at site of left chest wall after previous mastectomy with removal and treatment with radiaton.  . Ovary surgery  1974    lt  . Carpal tunnel release Right 05/08/2013    Procedure: RIGHT CARPAL TUNNEL RELEASE;  Surgeon: Cammie Sickle., MD;  Location: [facility];  Service: Orthopedics;  Laterality: Right;  . Basal cell removal  7/94    right nose  . Cataract extraction      03/20/14 left eye, 12/15 right eye   Allergies  Allergen Reactions  . Erythromycin Nausea And Vomiting  . Penicillins Hives  . Sulfa Antibiotics Rash   Prior to Admission medications   Medication Sig Start Date End Date Taking? Authorizing Provider  Acetaminophen (TYLENOL 8  HOUR ARTHRITIS PAIN PO) Take by mouth.   Yes Historical Provider, MD  albuterol (PROVENTIL HFA;VENTOLIN HFA) 108 (90 Base) MCG/ACT inhaler Inhale 2 puffs into the lungs every 4 (four) hours as needed for wheezing. 06/20/15  Yes Darlyne Russian, MD  amLODipine (NORVASC) 10 MG tablet Take 10 mg by mouth daily.   Yes Historical Provider, MD  anastrozole (ARIMIDEX) 1 MG tablet Take 1 tablet by mouth  daily 02/23/15  Yes Ladell Pier, MD  calcium gluconate 500 MG tablet Take 1 tablet (500 mg total) by mouth daily. 01/11/12  Yes Ladell Pier, MD  Cholecalciferol (VITAMIN D-3 PO) Take 1,000 Units by mouth daily.    Yes Historical Provider, MD  doxycycline (VIBRAMYCIN) 100 MG capsule Take 1 capsule (100 mg total) by mouth 2 (two) times daily. 06/20/15  Yes Darlyne Russian, MD  Multiple Vitamins-Minerals (MULTIVITAL PO) Take 1 tablet by mouth daily.   Yes Historical  Provider, MD  omeprazole (PRILOSEC) 20 MG capsule  04/09/14  Yes Historical Provider, MD  predniSONE (DELTASONE) 10 MG tablet Take 3 a day for 3 days 2 a day for 3 days one a day for 3 days 06/20/15  Yes Darlyne Russian, MD  benzonatate (TESSALON) 100 MG capsule Take 1-2 capsules (100-200 mg total) by mouth 3 (three) times daily as needed for cough. Patient not taking: Reported on 06/23/2015 06/20/15   Darlyne Russian, MD  ibuprofen (ADVIL,MOTRIN) 200 MG tablet Take 200 mg by mouth every 6 (six) hours as needed. Reported on 06/23/2015    Historical Provider, MD  methocarbamol (ROBAXIN) 500 MG tablet Take 500 mg by mouth 2 (two) times daily as needed. Reported on 06/23/2015 02/24/15   Historical Provider, MD   Social History   Social History  . Marital Status: Divorced    Spouse Name: N/A  . Number of Children: 1  . Years of Education: N/A   Occupational History  .  [place] History Main Topics  . Smoking status: Never Smoker   . Smokeless tobacco: Never Used  . Alcohol Use: No     Comment: rare-wine  . Drug Use: No  . Sexual Activity: No   Other Topics Concern  . Not on file   Social History Narrative    Review of Systems  Constitutional: Negative for fever and chills.  HENT: Positive for congestion. Negative for ear discharge and ear pain.   Eyes: Negative for pain, redness and itching.  Respiratory: Positive for cough.   Gastrointestinal: Negative for nausea and vomiting.  Musculoskeletal: Negative for myalgias, neck pain and neck stiffness.       Objective:   Physical Exam Filed Vitals:   06/23/15 1141 06/23/15 1228  BP: 158/76 157/76  Pulse: 75   Temp: 98.7 F (37.1 C)   TempSrc: Oral   Resp: 17   Height: 5\' 3"  (1.6 m)   Weight: 169 lb (76.658 kg)   SpO2: 97%     CONSTITUTIONAL: Well developed/well nourished HEAD: Normocephalic/atraumatic EYES: EOMI/PERRL ENMT: Mucous membranes moist NECK: supple no meningeal signs SPINE/BACK:entire spine  nontender CV: S1/S2 noted, no murmurs/rubs/gallops noted LUNGS: She has coarse rhonchi with expiratory wheezes bilaterally, no rales.  ABDOMEN: soft, nontender, no rebound or guarding, bowel sounds noted throughout abdomen SKIN: warm, color normal PSYCH: no abnormalities of mood noted, alert and oriented to situation       Assessment & Plan:  Patient continues to be symptomatic. She continues to mucus in her  chest. No change in medications at present. Referral made to pulmonary for their help.I personally performed the services described in this documentation, which was scribed in my presence. The recorded information has been reviewed and is accurate.  Darlyne Russian, MD

## 2015-06-29 ENCOUNTER — Encounter: Payer: Self-pay | Admitting: Internal Medicine

## 2015-06-29 ENCOUNTER — Ambulatory Visit (INDEPENDENT_AMBULATORY_CARE_PROVIDER_SITE_OTHER): Payer: Medicare Other | Admitting: Internal Medicine

## 2015-06-29 VITALS — BP 160/76 | HR 81 | Ht 64.0 in | Wt 167.8 lb

## 2015-06-29 DIAGNOSIS — J4530 Mild persistent asthma, uncomplicated: Secondary | ICD-10-CM

## 2015-06-29 DIAGNOSIS — Z01811 Encounter for preprocedural respiratory examination: Secondary | ICD-10-CM | POA: Diagnosis not present

## 2015-06-29 HISTORY — DX: Mild persistent asthma, uncomplicated: J45.30

## 2015-06-29 LAB — NITRIC OXIDE: NITRIC OXIDE: 15

## 2015-06-29 MED ORDER — BECLOMETHASONE DIPROPIONATE 80 MCG/ACT IN AERS
2.0000 | INHALATION_SPRAY | Freq: Two times a day (BID) | RESPIRATORY_TRACT | Status: DC
Start: 2015-06-29 — End: 2015-08-31

## 2015-06-29 NOTE — Patient Instructions (Signed)
ICD-9-CM ICD-10-CM   1. Mild persistent asthma, uncomplicated 123456 [id number]   2. Pre-operative respiratory examination V72.82 Z01.811     #Asthma - High pretest probability for mild persistent asthma based on history - Recommend he start Qvar 80 g 2 puff 2 times daily scheduled maintenance with albuterol as needed  -Ensure you rinser mouth at all times after using Qvar - We will see her back in 3 months  #Preoperative respiratory exam 4 hip surgery - Low risk for complications in the lung from hip surgery as long as not actively wheezing  #followup  - 3 months or sooner if needed  - ACQ and FeNO at followup

## 2015-06-29 NOTE — Progress Notes (Signed)
Subjective:    Patient ID: Sarah Underwood, female    DOB: [birth year], 70 y.o.   MRN: [id number]  PCP Velna Hatchet, MD Referred by Dr Everlene Farrier Ortho: Dr Maureen Ralphs  HPI    IOV 06/29/2015  Chief Complaint  Patient presents with  . Pulmonary Consult    Pt referred by Dr. Everlene Farrier for wheezing and acute bronchitis. Pt states she was told by her PCP that she has COPD. Pt states every couple years she gets bronchitis. Pt c/o mild DOE, prod cough with clear and white mucus, wheezing x 2 weeks. Pt denies CP/tightness. Pt states she has just finished doxy and pred taper.    70 year old female referred for recurrent episodes of wheezing bronchitis.  Reports that in 2011 had episode of viral infection followed by severe acute bronchitis with wheezing that lasted several weeks. This again happened in 2013. Then in 2015 had atrial fibrillation was seen by Dr. Einar Gip and apparently was told that she might have COPD based on chest x-ray hyperinflation according to her report. Most recently admitted February 2017 she had a steroid injection into her right hip because of DJD [she's been using a cane for the last few months and is anticipated to have hip replacement surgery]. Then she took a flight to [facility]. Couple of days after getting that got acutely ill with respiratory infection him a cough, chest tightness, wheezing. Upon her return she's been treated with 10 days of doxycycline and prednisone taper which is ending today. And she is significantly better. She reports that in between episodes she occasionally has wheezing particularly on exposure to cold air or with exertion.   Chest x-ray 06/20/2015: Lung fields are clear as personally visualized and per report. Agree with surgical clips on the left side  Exhaled nitric oxide today is 15 ppb but is on the last of 10 days of prednisone and normal  Blood work hemoglobin 12.6 g 06/20/2015.   has a past medical history of Graves disease (2007);  Ovarian mass; Breast cancer ([place]) (1995 & 2001); Basal cell carcinoma (1994); Complication of anesthesia; Seasonal allergies; and Skin cancer.   reports that she has never smoked. She has never used smokeless tobacco.  Past Surgical History  Procedure Laterality Date  . Tonsillectomy and adenoidectomy  1950  . Mastectomy Left 3/95    Stage IIb 0/14 LN ER/ PR + with mastectomy - Dr. Mendel Ryder  . Breast biopsy Right 1987    benign fibroadenoma  . Colposcopy w/ biopsy / curettage  03/12/12    benign, but + HR HPV  . Breast cancer recurrence Left 01/2000    at site of left chest wall after previous mastectomy with removal and treatment with radiaton.  . Ovary surgery  1974    lt  . Carpal tunnel release Right 05/08/2013    Procedure: RIGHT CARPAL TUNNEL RELEASE;  Surgeon: Cammie Sickle., MD;  Location: [facility];  Service: Orthopedics;  Laterality: Right;  . Basal cell removal  7/94    right nose  . Cataract extraction      03/20/14 left eye, 12/15 right eye    Allergies  Allergen Reactions  . Erythromycin Nausea And Vomiting  . Penicillins Hives  . Sulfa Antibiotics Rash    Immunization History  Administered Date(s) Administered  . Influenza,inj,Quad PF,36+ Mos 02/23/2015  . Pneumococcal Polysaccharide-23 04/24/2012    Family History  Problem Relation Age of Onset  . Breast cancer Mother 79  deceased 59  . Osteoporosis Mother   . Hypertension Mother   . Hypertension Father   . Heart attack Father   . Breast cancer Sister 31    bilateral; currently 43  . Diabetes Brother   . Lung cancer Maternal Grandfather     deceased 7  . Cancer Paternal Grandmother     duodenal; deceased 17     Current outpatient prescriptions:  .  Acetaminophen (TYLENOL 8 HOUR ARTHRITIS PAIN PO), Take by mouth., Disp: , Rfl:  .  albuterol (PROVENTIL HFA;VENTOLIN HFA) 108 (90 Base) MCG/ACT inhaler, Inhale 2 puffs into the lungs every 4 (four) hours as needed for  wheezing., Disp: 1 Inhaler, Rfl: 3 .  amLODipine (NORVASC) 10 MG tablet, Take 10 mg by mouth daily., Disp: , Rfl:  .  anastrozole (ARIMIDEX) 1 MG tablet, Take 1 tablet by mouth  daily, Disp: 90 tablet, Rfl: 3 .  benzonatate (TESSALON) 100 MG capsule, Take 1-2 capsules (100-200 mg total) by mouth 3 (three) times daily as needed for cough., Disp: 40 capsule, Rfl: 0 .  calcium gluconate 500 MG tablet, Take 1 tablet (500 mg total) by mouth daily., Disp: , Rfl:  .  Cholecalciferol (VITAMIN D-3 PO), Take 1,000 Units by mouth daily. , Disp: , Rfl:  .  ibuprofen (ADVIL,MOTRIN) 200 MG tablet, Take 200 mg by mouth every 6 (six) hours as needed. Reported on 06/23/2015, Disp: , Rfl:  .  methocarbamol (ROBAXIN) 500 MG tablet, Take 500 mg by mouth 2 (two) times daily as needed. Reported on 06/23/2015, Disp: , Rfl: 0 .  Multiple Vitamins-Minerals (MULTIVITAL PO), Take 1 tablet by mouth daily., Disp: , Rfl:  .  omeprazole (PRILOSEC) 20 MG capsule, , Disp: , Rfl:       Review of Systems  Constitutional: Negative for fever and unexpected weight change.  HENT: Negative for congestion, dental problem, ear pain, nosebleeds, postnasal drip, rhinorrhea, sinus pressure, sneezing, sore throat and trouble swallowing.   Eyes: Negative for redness and itching.  Respiratory: Positive for cough and shortness of breath. Negative for chest tightness and wheezing.   Cardiovascular: Negative for palpitations and leg swelling.  Gastrointestinal: Negative for nausea and vomiting.  Genitourinary: Negative for dysuria.  Musculoskeletal: Negative for joint swelling.  Skin: Negative for rash.  Neurological: Negative for headaches.  Hematological: Does not bruise/bleed easily.  Psychiatric/Behavioral: Negative for dysphoric mood. The patient is not nervous/anxious.        Objective:   Physical Exam  Constitutional: She is oriented to person, place, and time. She appears well-developed and well-nourished. No distress.  HENT:    Head: Normocephalic and atraumatic.  Right Ear: External ear normal.  Left Ear: External ear normal.  Mouth/Throat: Oropharynx is clear and moist. No oropharyngeal exudate.  Eyes: Conjunctivae and EOM are normal. Pupils are equal, round, and reactive to light. Right eye exhibits no discharge. Left eye exhibits no discharge. No scleral icterus.  Neck: Normal range of motion. Neck supple. No JVD present. No tracheal deviation present. No thyromegaly present.  Cardiovascular: Normal rate, regular rhythm, normal heart sounds and intact distal pulses.  Exam reveals no gallop and no friction rub.   No murmur heard. Pulmonary/Chest: Effort normal and breath sounds normal. No respiratory distress. She has no wheezes. She has no rales. She exhibits no tenderness.  Abdominal: Soft. Bowel sounds are normal. She exhibits no distension and no mass. There is no tenderness. There is no rebound and no guarding.  Musculoskeletal: Normal range of motion. She  exhibits no edema or tenderness.  Lymphadenopathy:    She has no cervical adenopathy.  Neurological: She is alert and oriented to person, place, and time. She has normal reflexes. No cranial nerve deficit. She exhibits normal muscle tone. Coordination normal.  Skin: Skin is warm and dry. No rash noted. She is not diaphoretic. No erythema. No pallor.  Psychiatric: She has a normal mood and affect. Her behavior is normal. Judgment and thought content normal.  Vitals reviewed.   Filed Vitals:   06/29/15 1620  BP: 160/76  Pulse: 81  Height: 5\' 4"  (1.626 m)  Weight: 167 lb 12.8 oz (76.114 kg)  SpO2: 98%         Assessment & Plan:     ICD-9-CM ICD-10-CM   1. Mild persistent asthma, uncomplicated 123456 [id number] Nitric oxide  2. Pre-operative respiratory examination V72.82 Z01.811    Her story is high pretest probably ready for mild persistent asthma. I think her viral infections produce asthma exacerbations. I could not prove asthma because she's  currently on steroids and therefore her exhaled nitric oxide levels got masked by this. Nevertheless I think the story is pretty convincing that given wheezing episodes between respiratory exacerbations it's probably worthwhile to have her on low-dose inhaled maintenance steroids. [Her granddaughter is on this] she is agreeable to this plan. We will follow along. I think inhaled steroids might be able to blunt the severity of viral exacerbations.  She is due to have a hip surgery, and a pretty soon. As long as she's not in an asthma exacerbation and wheezing actively she is of low risk for pulmonary  complications from hip surgery   Dr. Brand Males, M.D., [facility].C.P Pulmonary and Critical Care Medicine Staff Physician [place] Pager: [phone], If no answer or between  15:00h - 7:00h: call [phone]  06/29/2015 5:35 PM

## 2015-06-30 ENCOUNTER — Telehealth: Payer: Self-pay | Admitting: *Deleted

## 2015-06-30 NOTE — Telephone Encounter (Signed)
Initiated PA for Qvar thru [facility]. [id number] Submitted for review. Pt ID: [id number]  [facility] (p) [phone]  (f) [phone]

## 2015-07-02 MED ORDER — FLUTICASONE PROPIONATE HFA 110 MCG/ACT IN AERO: 2.0000 | INHALATION_SPRAY | Freq: Two times a day (BID) | RESPIRATORY_TRACT | Status: DC

## 2015-07-02 NOTE — Telephone Encounter (Signed)
Patient states that she would rather not take a Powder inhaler unless Dr. Chase Caller thinks this is best for her.  She prefers a mist.  Per below message, Flovent HFA is one of the choices.  Patient wants to know if ok to try Flovent HFA instead of using the powder?  Please advise.

## 2015-07-02 NOTE — Telephone Encounter (Signed)
[place] for [facility]

## 2015-07-02 NOTE — Telephone Encounter (Signed)
flovent hfa is cool with me

## 2015-07-02 NOTE — Telephone Encounter (Signed)
Qvar has been denied by insurance. Must try Arnuity, flovent diskus or Flovent HFA

## 2015-07-02 NOTE — Telephone Encounter (Signed)
Patient notified of Dr. Golden Pop recommendations. Rx sent to pharmacy. Nothing further needed.

## 2015-08-17 ENCOUNTER — Telehealth: Payer: Self-pay | Admitting: *Deleted

## 2015-08-17 NOTE — Telephone Encounter (Signed)
08 Pap recall due 02/2015 due to Pos HR HPV, + HPV Genotype 16 on pap 2013  Past History:   03/04/14 Pap, negative with neg HR HPV 02/21/13 Pap, negative with neg HR HPV 03/12/12 Colpo, Negative biopsy and ECC 02/20/12 Pap, Negative with + HR HPV, + HPV Genotype 16  Phone call to patient to schedule annual exam.  Patient states the last time she saw Dr. Sabra Heck the plan was discussed to have annual exam every two years. The patient was last seen by Dr. Sabra Heck on 05/28/14 for pelvic ultrasound.  Annual exam scheduled for 03/09/15 was cancelled on this date and new recall entered for 05/28/16.  Please advise if OK to remove current 02/2015 recall and if 2018 recall should be 02 or 08 (currently not noted).

## 2015-08-19 ENCOUNTER — Telehealth: Payer: Self-pay | Admitting: Internal Medicine

## 2015-08-19 NOTE — Telephone Encounter (Signed)
Spoke with the pt  She states that she is needing to have her last ov note faxed to Dr Maureen Ralphs  I have sent this  Nothing further needed

## 2015-08-24 NOTE — Telephone Encounter (Signed)
Ok to change to 02 recall and remove current 08 recall.

## 2015-08-26 ENCOUNTER — Ambulatory Visit: Payer: Self-pay | Admitting: Orthopedic Surgery

## 2015-08-26 NOTE — Progress Notes (Signed)
Preoperative surgical orders have been place into the Epic hospital system for Beacher May on 08/26/2015, 10:23 AM  by Mickel Crow for surgery on 09-08-15.  Preop Total Hip - Anterior Approach orders including IV Tylenol, and IV Decadron as long as there are no contraindications to the above medications. Arlee Muslim, PA-C

## 2015-08-26 NOTE — Telephone Encounter (Signed)
08 Recall completed and patient removed from current recall.  Closing encounter.

## 2015-08-31 ENCOUNTER — Ambulatory Visit (HOSPITAL_BASED_OUTPATIENT_CLINIC_OR_DEPARTMENT_OTHER): Payer: Medicare Other | Admitting: Oncology

## 2015-08-31 ENCOUNTER — Telehealth: Payer: Self-pay | Admitting: Oncology

## 2015-08-31 ENCOUNTER — Encounter (HOSPITAL_COMMUNITY)
Admission: RE | Admit: 2015-08-31 | Discharge: 2015-08-31 | Disposition: A | Discharge status: Home / Self Care | Payer: Medicare Other | Source: Ambulatory Visit | Attending: Orthopedic Surgery | Admitting: Orthopedic Surgery

## 2015-08-31 ENCOUNTER — Encounter (HOSPITAL_COMMUNITY): Payer: Self-pay

## 2015-08-31 VITALS — BP 156/63 | HR 66 | Temp 97.7°F | Resp 20 | Ht 64.0 in | Wt 167.6 lb

## 2015-08-31 DIAGNOSIS — Z01812 Encounter for preprocedural laboratory examination: Secondary | ICD-10-CM | POA: Insufficient documentation

## 2015-08-31 DIAGNOSIS — C50912 Malignant neoplasm of unspecified site of left female breast: Secondary | ICD-10-CM

## 2015-08-31 DIAGNOSIS — M1611 Unilateral primary osteoarthritis, right hip: Secondary | ICD-10-CM | POA: Diagnosis not present

## 2015-08-31 DIAGNOSIS — Z0183 Encounter for blood typing: Secondary | ICD-10-CM | POA: Insufficient documentation

## 2015-08-31 DIAGNOSIS — C44501 Unspecified malignant neoplasm of skin of breast: Secondary | ICD-10-CM

## 2015-08-31 HISTORY — DX: Other specified postprocedural states: Z98.890

## 2015-08-31 HISTORY — DX: Anesthesia of skin: R20.0

## 2015-08-31 HISTORY — DX: Personal history of irradiation: Z92.3

## 2015-08-31 HISTORY — DX: Essential (primary) hypertension: I10

## 2015-08-31 HISTORY — DX: Benign paroxysmal vertigo, unspecified ear: H81.10

## 2015-08-31 HISTORY — DX: Unspecified osteoarthritis, unspecified site: M19.90

## 2015-08-31 HISTORY — DX: Cardiac arrhythmia, unspecified: I49.9

## 2015-08-31 HISTORY — DX: Gastro-esophageal reflux disease without esophagitis: K21.9

## 2015-08-31 HISTORY — DX: Unspecified asthma, uncomplicated: J45.909

## 2015-08-31 HISTORY — DX: Paresthesia of skin: R20.2

## 2015-08-31 HISTORY — DX: Cardiac murmur, unspecified: R01.1

## 2015-08-31 HISTORY — DX: Presence of spectacles and contact lenses: Z97.3

## 2015-08-31 HISTORY — DX: Nausea with vomiting, unspecified: R11.2

## 2015-08-31 HISTORY — DX: Nocturia: R35.1

## 2015-08-31 HISTORY — DX: Unspecified hearing loss, unspecified ear: H91.90

## 2015-08-31 HISTORY — DX: Family history of other specified conditions: Z84.89

## 2015-08-31 LAB — COMPREHENSIVE METABOLIC PANEL
ALBUMIN: 4.5 g/dL (ref 3.5–5.0)
ALK PHOS: 74 U/L (ref 38–126)
ALT: 18 U/L (ref 14–54)
AST: 21 U/L (ref 15–41)
Anion gap: 8 (ref 5–15)
BILIRUBIN TOTAL: 0.6 mg/dL (ref 0.3–1.2)
BUN: 17 mg/dL (ref 6–20)
CALCIUM: 9.3 mg/dL (ref 8.9–10.3)
CO2: 26 mmol/L (ref 22–32)
Chloride: 106 mmol/L (ref 101–111)
Creatinine, Ser: 0.81 mg/dL (ref 0.44–1.00)
GFR calc Af Amer: >60 mL/min (ref >60)
GFR calc non Af Amer: >60 mL/min (ref >60)
GLUCOSE: 92 mg/dL (ref 65–99)
POTASSIUM: 4.4 mmol/L (ref 3.5–5.1)
Sodium: 140 mmol/L (ref 135–145)
TOTAL PROTEIN: 7.6 g/dL (ref 6.5–8.1)

## 2015-08-31 LAB — CBC
HEMATOCRIT: 39.1 % (ref 36.0–46.0)
HEMOGLOBIN: 13 g/dL (ref 12.0–15.0)
MCH: 29 pg (ref 26.0–34.0)
MCHC: 33.2 g/dL (ref 30.0–36.0)
MCV: 87.3 fL (ref 78.0–100.0)
Platelets: 341 10*3/uL (ref 150–400)
RBC: 4.48 MIL/uL (ref 3.87–5.11)
RDW: 13 % (ref 11.5–15.5)
WBC: 8 10*3/uL (ref 4.0–10.5)

## 2015-08-31 LAB — SURGICAL PCR SCREEN
MRSA, PCR: NEGATIVE
STAPHYLOCOCCUS AUREUS: POSITIVE — AB

## 2015-08-31 LAB — URINALYSIS, ROUTINE W REFLEX MICROSCOPIC
BILIRUBIN URINE: NEGATIVE
Glucose, UA: NEGATIVE mg/dL
HGB URINE DIPSTICK: NEGATIVE
KETONES UR: NEGATIVE mg/dL
NITRITE: NEGATIVE
Protein, ur: NEGATIVE mg/dL
SPECIFIC GRAVITY, URINE: 1.026 (ref 1.005–1.030)
pH: 6 (ref 5.0–8.0)

## 2015-08-31 LAB — URINE MICROSCOPIC-ADD ON: RBC / HPF: NONE SEEN RBC/hpf (ref 0–5)

## 2015-08-31 LAB — APTT: APTT: 30 s (ref 24–37)

## 2015-08-31 LAB — ABO/RH: ABO/RH(D): A POS

## 2015-08-31 LAB — PROTIME-INR
INR: 0.97 (ref 0.00–1.49)
Prothrombin Time: 13.1 seconds (ref 11.6–15.2)

## 2015-08-31 NOTE — Progress Notes (Signed)
Stress test results per chart 08/20/2013 per chart  ECHO results per chart 09/03/2013  EKG per chart 07/26/2015  H&P per chart per Dr Einar Gip 07/26/2015 with clearance noted  H&P per [facility] 08/20/2015 also includes EKG from 05/05/2015

## 2015-08-31 NOTE — Telephone Encounter (Signed)
Gave pt appt & avs °

## 2015-08-31 NOTE — Progress Notes (Signed)
  [facility] OFFICE PROGRESS NOTE   Diagnosis:  Breast cancer  INTERVAL HISTORY:    Sarah Underwood returns as scheduled. She is scheduled to undergo right hip replacement surgery next week. No other significant arthralgias. Minimal hot flashes. No change over the chest wall. She continues Arimidex. She had a negative right breast ultrasound was negative on 03/15/2015.     Objective:  Vital signs in last 24 hours:  Blood pressure 156/63, pulse 66, temperature 97.7 F (36.5 C), temperature source Oral, resp. rate 20, height 5\' 4"  (1.626 m), weight 167 lb 9.6 oz (76.023 kg), last menstrual period 04/25/1999, SpO2 100 %.    HEENT:  Neck without mass Lymphatics:  No cervical, supra-clavicular, or axillary nodes Resp:  Lungs clear bilaterally Cardio:  Regular rate and rhythm GI:  No hepatomegaly Vascular:  No leg edema  breast: status post left mastectomy. No evidence for chest wall tumor recurrence. Right breast without mass.   Lab Results:  Lab Results  Component Value Date   WBC 8.0 08/31/2015   HGB 13.0 08/31/2015   HCT 39.1 08/31/2015   MCV 87.3 08/31/2015   PLT 341 08/31/2015     Medications: I have reviewed the patient's current medications.  Assessment/Plan:   Sarah Underwood has a history of left-sided breast cancer dating to 34. She developed a chest wall recurrence in October 2001. She remains in clinical remission. She will continue indefinite Arimidex.    she will be scheduled for a bone density scan Ann mammogram in August 2017.  Disposition:   Sarah Underwood will return for an office visit in one year.  Betsy Coder, MD  08/31/2015  11:39 AM

## 2015-08-31 NOTE — Patient Instructions (Signed)
Sarah Underwood  08/31/2015   Your procedure is scheduled on: Wednesday Sep 08, 2015   Report to [facility] Main  Entrance take [place]  elevators to 3rd floor to  [place] at 11:45 AM.  Call this number if you have problems the morning of surgery [phone]   Remember: ONLY 1 PERSON MAY GO WITH YOU TO SHORT STAY TO GET  READY MORNING OF [place].  Do not eat food After Midnight but may take clear liquids till 7:45 am day of surgery then nothing by mouth.      Take these medicines the morning of surgery with A SIP OF WATER: Amlodipine; May use Albuterol Inhaler if needed (bring inhaler with you day of surgery); May use eye drops if needed (bring bottle with you day of surgery); Omeprazole                                You may not have any metal on your body including hair pins and              piercings  Do not wear jewelry, make-up, lotions, powders or perfumes, deodorant             Do not wear nail polish.  Do not shave  48 hours prior to surgery.               Do not bring valuables to the hospital. [place].  Contacts, dentures or bridgework may not be worn into surgery.  Leave suitcase in the car. After surgery it may be brought to your room.                Please read over the following fact sheets you were given:MRSA INFORMATION SHEET; INCENTIVE SPIROMETER; BLOOD TRANSFUSION INFORMATION SHEET  _____________________________________________________________________             [facility] - Preparing for Surgery Before surgery, you can play an important role.  Because skin is not sterile, your skin needs to be as free of germs as possible.  You can reduce the number of germs on your skin by washing with CHG (chlorahexidine gluconate) soap before surgery.  CHG is an antiseptic cleaner which kills germs and bonds with the skin to continue killing germs even after washing. Please DO NOT use if  you have an allergy to CHG or antibacterial soaps.  If your skin becomes reddened/irritated stop using the CHG and inform your nurse when you arrive at Short Stay. Do not shave (including legs and underarms) for at least 48 hours prior to the first CHG shower.  You may shave your face/neck. Please follow these instructions carefully:  1.  Shower with CHG Soap the night before surgery and the  morning of Surgery.  2.  If you choose to wash your hair, wash your hair first as usual with your  normal  shampoo.  3.  After you shampoo, rinse your hair and body thoroughly to remove the  shampoo.                           4.  Use CHG as you would any other liquid soap.  You can apply  chg directly  to the skin and wash                       Gently with a scrungie or clean washcloth.  5.  Apply the CHG Soap to your body ONLY FROM THE NECK DOWN.   Do not use on face/ open                           Wound or open sores. Avoid contact with eyes, ears mouth and genitals (private parts).                       Wash face,  Genitals (private parts) with your normal soap.             6.  Wash thoroughly, paying special attention to the area where your surgery  will be performed.  7.  Thoroughly rinse your body with warm water from the neck down.  8.  DO NOT shower/wash with your normal soap after using and rinsing off  the CHG Soap.                9.  Pat yourself dry with a clean towel.            10.  Wear clean pajamas.            11.  Place clean sheets on your bed the night of your first shower and do not  sleep with pets. Day of Surgery : Do not apply any lotions/deodorants the morning of surgery.  Please wear clean clothes to the hospital/surgery center.  FAILURE TO FOLLOW THESE INSTRUCTIONS MAY RESULT IN THE CANCELLATION OF YOUR SURGERY PATIENT SIGNATURE_________________________________  NURSE  SIGNATURE__________________________________  ________________________________________________________________________    CLEAR LIQUID DIET   Foods Allowed                                                                     Foods Excluded  Coffee and tea, regular and decaf                             liquids that you cannot  Plain Jell-O in any flavor                                             see through such as: Fruit ices (not with fruit pulp)                                     milk, soups, orange juice  Iced Popsicles                                    All solid food Carbonated beverages, regular and diet  Cranberry, grape and apple juices Sports drinks like Gatorade Lightly seasoned clear broth or consume(fat free) Sugar, honey syrup  Sample Menu Breakfast                                Lunch                                     Supper Cranberry juice                    Beef broth                            Chicken broth Jell-O                                     Grape juice                           Apple juice Coffee or tea                        Jell-O                                      Popsicle                                                Coffee or tea                        Coffee or tea  _____________________________________________________________________    Incentive Spirometer  An incentive spirometer is a tool that can help keep your lungs clear and active. This tool measures how well you are filling your lungs with each breath. Taking long deep breaths may help reverse or decrease the chance of developing breathing (pulmonary) problems (especially infection) following:  A long period of time when you are unable to move or be active. BEFORE THE PROCEDURE   If the spirometer includes an indicator to show your best effort, your nurse or respiratory therapist will set it to a desired goal.  If possible, sit up straight or lean  slightly forward. Try not to slouch.  Hold the incentive spirometer in an upright position. INSTRUCTIONS FOR USE   Sit on the edge of your bed if possible, or sit up as far as you can in bed or on a chair.  Hold the incentive spirometer in an upright position.  Breathe out normally.  Place the mouthpiece in your mouth and seal your lips tightly around it.  Breathe in slowly and as deeply as possible, raising the piston or the ball toward the top of the column.  Hold your breath for 3-5 seconds or for as long as possible. Allow the piston or ball to fall to the bottom of the column.  Remove the mouthpiece from your mouth and breathe out normally.  Rest for a few seconds and repeat Steps 1 through 7 at  least 10 times every 1-2 hours when you are awake. Take your time and take a few normal breaths between deep breaths.  The spirometer may include an indicator to show your best effort. Use the indicator as a goal to work toward during each repetition.  After each set of 10 deep breaths, practice coughing to be sure your lungs are clear. If you have an incision (the cut made at the time of surgery), support your incision when coughing by placing a pillow or rolled up towels firmly against it. Once you are able to get out of bed, walk around indoors and cough well. You may stop using the incentive spirometer when instructed by your caregiver.  RISKS AND COMPLICATIONS  Take your time so you do not get dizzy or light-headed.  If you are in pain, you may need to take or ask for pain medication before doing incentive spirometry. It is harder to take a deep breath if you are having pain. AFTER USE  Rest and breathe slowly and easily.  It can be helpful to keep track of a log of your progress. Your caregiver can provide you with a simple table to help with this. If you are using the spirometer at home, follow these instructions: [place] IF:   You are having difficultly using the  spirometer.  You have trouble using the spirometer as often as instructed.  Your pain medication is not giving enough relief while using the spirometer.  You develop fever of 100.5 F (38.1 C) or higher. SEEK IMMEDIATE MEDICAL CARE IF:   You cough up bloody sputum that had not been present before.  You develop fever of 102 F (38.9 C) or greater.  You develop worsening pain at or near the incision site. MAKE SURE YOU:   Understand these instructions.  Will watch your condition.  Will get help right away if you are not doing well or get worse. Document Released: 08/21/2006 Document Revised: 07/03/2011 Document Reviewed: 10/22/2006 ExitCare Patient Information 2014 Exit[employer], [place].   ________________________________________________________________________  WHAT IS A BLOOD TRANSFUSION? Blood Transfusion Information  A transfusion is the replacement of blood or some of its parts. Blood is made up of multiple cells which provide different functions.  Red blood cells carry oxygen and are used for blood loss replacement.  White blood cells fight against infection.  Platelets control bleeding.  Plasma helps clot blood.  Other blood products are available for specialized needs, such as hemophilia or other clotting disorders. BEFORE THE TRANSFUSION  Who gives blood for transfusions?   Healthy volunteers who are fully evaluated to make sure their blood is safe. This is blood bank blood. Transfusion therapy is the safest it has ever been in the practice of medicine. Before blood is taken from a donor, a complete history is taken to make sure that person has no history of diseases nor engages in risky social behavior (examples are intravenous drug use or sexual activity with multiple partners). The donor's travel history is screened to minimize risk of transmitting infections, such as malaria. The donated blood is tested for signs of infectious diseases, such as HIV and hepatitis.  The blood is then tested to be sure it is compatible with you in order to minimize the chance of a transfusion reaction. If you or a relative donates blood, this is often done in anticipation of surgery and is not appropriate for emergency situations. It takes many days to process the donated blood. RISKS AND COMPLICATIONS Although transfusion  therapy is very safe and saves many lives, the main dangers of transfusion include:   Getting an infectious disease.  Developing a transfusion reaction. This is an allergic reaction to something in the blood you were given. Every precaution is taken to prevent this. The decision to have a blood transfusion has been considered carefully by your caregiver before blood is given. Blood is not given unless the benefits outweigh the risks. AFTER THE TRANSFUSION  Right after receiving a blood transfusion, you will usually feel much better and more energetic. This is especially true if your red blood cells have gotten low (anemic). The transfusion raises the level of the red blood cells which carry oxygen, and this usually causes an energy increase.  The nurse administering the transfusion will monitor you carefully for complications. HOME CARE INSTRUCTIONS  No special instructions are needed after a transfusion. You may find your energy is better. Speak with your caregiver about any limitations on activity for underlying diseases you may have. SEEK MEDICAL CARE IF:   Your condition is not improving after your transfusion.  You develop redness or irritation at the intravenous (IV) site. SEEK IMMEDIATE MEDICAL CARE IF:  Any of the following symptoms occur over the next 12 hours:  Shaking chills.  You have a temperature by mouth above 102 F (38.9 C), not controlled by medicine.  Chest, back, or muscle pain.  People around you feel you are not acting correctly or are confused.  Shortness of breath or difficulty breathing.  Dizziness and fainting.  You  get a rash or develop hives.  You have a decrease in urine output.  Your urine turns a dark color or changes to pink, red, or brown. Any of the following symptoms occur over the next 10 days:  You have a temperature by mouth above 102 F (38.9 C), not controlled by medicine.  Shortness of breath.  Weakness after normal activity.  The white part of the eye turns yellow (jaundice).  You have a decrease in the amount of urine or are urinating less often.  Your urine turns a dark color or changes to pink, red, or brown. Document Released: 04/07/2000 Document Revised: 07/03/2011 Document Reviewed: 11/25/2007 [facility] Patient Information 2014 [place], [place].  _______________________________________________________________________

## 2015-09-01 NOTE — Progress Notes (Signed)
PCR screening in epic per PAT visit 08/31/2015 positive for STAPH. Results sent to Dr Wynelle Link. Prescription for Mupriocin Ointment called to [facility]. Spoke with Jeff/Pharmacist. Pt aware.

## 2015-09-02 NOTE — H&P (Signed)
TOTAL HIP ADMISSION H&P  Patient is admitted for right total hip arthroplasty.  Subjective:  Chief Complaint: right hip pain  HPI: TONNA FRONTZ, 70 y.o. female, has a history of pain and functional disability in the right hip(s) due to arthritis and patient has failed non-surgical conservative treatments for greater than 12 weeks to include NSAID's and/or analgesics, corticosteriod injections, flexibility and strengthening excercises and activity modification.  Onset of symptoms was gradual starting 5 years ago with gradually worsening course since that time.The patient noted no past surgery on the right hip(s).  Patient currently rates pain in the right hip at 8 out of 10 with activity. Patient has night pain, worsening of pain with activity and weight bearing, pain that interfers with activities of daily living and pain with passive range of motion. Patient has evidence of periarticular osteophytes and joint space narrowing by imaging studies. This condition presents safety issues increasing the risk of falls.  There is no current active infection.  Patient Active Problem List   Diagnosis Date Noted  . Mild persistent asthma 06/29/2015  . Pre-operative respiratory examination 06/29/2015  . Hip pain, bilateral 12/14/2014  . Knee pain, bilateral 12/14/2014  . Skin cancer   . Postmenopausal atrophic vaginitis 02/23/2013  . Breast cancer [facility])    Past Medical History  Diagnosis Date  . Graves disease 2007    thyroid nodule  . Ovarian mass     small solid nodule 8x89mm: Right ovary - stable since 2000  . Breast cancer ([place]) 1995 & 2001    left-left mastectomy, left chest wall recurrence, radiation x 7 weeks  . Basal cell carcinoma 1994    right side of node  . Complication of anesthesia     slow waking up  . Seasonal allergies   . Skin cancer     basal cell- nose  . PONV (postoperative nausea and vomiting)     pt states occurred with pain meds questions darvocet   . Family  history of adverse reaction to anesthesia     pts mother had postop vomiting   . Dysrhythmia   . Hypertension   . Heart murmur   . Numbness and tingling     bilat more per right hand  . Wears glasses   . Hard of hearing   . Benign positional vertigo     with rolling over   . Asthma   . History of bronchitis     last episode 05/2015  . Nocturia   . GERD (gastroesophageal reflux disease)   . Arthritis   . History of radiation therapy     Past Surgical History  Procedure Laterality Date  . Tonsillectomy and adenoidectomy  1950  . Mastectomy Left 3/95    Stage IIb 0/14 LN ER/ PR + with mastectomy - Dr. Mendel Ryder  . Breast biopsy Right 1987    benign fibroadenoma  . Colposcopy w/ biopsy / curettage  03/12/12    benign, but + HR HPV  . Breast cancer recurrence Left 01/2000    at site of left chest wall after previous mastectomy with removal and treatment with radiaton.  . Ovary surgery  1974    lt  . Carpal tunnel release Right 05/08/2013    Procedure: RIGHT CARPAL TUNNEL RELEASE;  Surgeon: Cammie Sickle., MD;  Location: [facility];  Service: Orthopedics;  Laterality: Right;  . Basal cell removal  7/94    right nose  . Cataract extraction  03/20/14 left eye, 12/15 right eye  . Eye surgery      bilat cataract surgery   . Tonsillectomy    . Mohs surgery      right nare  . Appendectomy      Current outpatient prescriptions:  .  acetaminophen (TYLENOL) 650 MG CR tablet, Take 1,300 mg by mouth 2 (two) times daily., Disp: , Rfl:  .  albuterol (PROVENTIL HFA;VENTOLIN HFA) 108 (90 Base) MCG/ACT inhaler, Inhale 2 puffs into the lungs every 4 (four) hours as needed for wheezing., Disp: 1 Inhaler, Rfl: 3 .  amLODipine (NORVASC) 5 MG tablet, Take 5 mg by mouth daily. for blood pressure, Disp: , Rfl: 5 .  anastrozole (ARIMIDEX) 1 MG tablet, Take 1 tablet by mouth  daily, Disp: 90 tablet, Rfl: 3 .  Artificial Tear Solution (TEARS NATURALE OP), Place 2 drops into  both eyes daily as needed (For dry eyes.)., Disp: , Rfl:  .  Calcium Citrate 250 MG TABS, Take 250 mg by mouth 2 (two) times daily., Disp: , Rfl:  .  Cholecalciferol (VITAMIN D3) 2000 units TABS, Take 2,000 Units by mouth daily., Disp: , Rfl:  .  Coenzyme Q10 100 MG TABS, Take 100 mg by mouth daily., Disp: , Rfl:  .  fluticasone (FLOVENT HFA) 110 MCG/ACT inhaler, Inhale 2 puffs into the lungs 2 (two) times daily., Disp: 1 Inhaler, Rfl: 12 .  ibuprofen (ADVIL,MOTRIN) 200 MG tablet, Take 600 mg by mouth 2 (two) times daily. Reported on 06/23/2015, Disp: , Rfl:  .  MAGNESIUM CITRATE PO, Take 1 tablet by mouth 2 (two) times daily., Disp: , Rfl:  .  Menaquinone-7 (VITAMIN K2) 100 MCG CAPS, Take 100 mcg by mouth daily., Disp: , Rfl:  .  methocarbamol (ROBAXIN) 500 MG tablet, Take 500 mg by mouth 2 (two) times daily as needed for muscle spasms. Reported on 06/23/2015, Disp: , Rfl: 0 .  Multiple Vitamins-Minerals (MULTIVITAMIN ADULTS PO), Take 1 tablet by mouth daily., Disp: , Rfl:  .  omeprazole (PRILOSEC) 20 MG capsule, Take 20 mg by mouth daily. , Disp: , Rfl:  .  OVER THE COUNTER MEDICATION, Apply 1 application topically daily. Cetaphil Eczema Calming Body Wash, Disp: , Rfl:  .  OVER THE COUNTER MEDICATION, Apply 1 application topically daily. Cetaphil Eczema Calming Body Moisturizer, Disp: , Rfl:  .  Probiotic Product (PROBIOTIC DAILY PO), Take 1 capsule by mouth daily., Disp: , Rfl:  .  valsartan (DIOVAN) 160 MG tablet, Take 160 mg by mouth daily., Disp: , Rfl: 11    Allergies  Allergen Reactions  . Erythromycin Nausea And Vomiting  . Penicillins Hives and Other (See Comments)    Has patient had a PCN reaction causing immediate rash, facial/tongue/throat swelling, SOB or lightheadedness with hypotension: no Has patient had a PCN reaction causing severe rash involving mucus membranes or skin necrosis: no Has patient had a PCN reaction that required hospitalization no Has patient had a PCN reaction  occurring within the last 10 years: no If all of the above answers are "NO", then may proceed with Cephalosporin use.   Marland Kitchen Hydrochlorothiazide Rash    Pt states caused a sunburn type reaction   . Sulfa Antibiotics Rash    Social History  Substance Use Topics  . Smoking status: Never Smoker   . Smokeless tobacco: Never Used  . Alcohol Use: 0.0 oz/week    0 Standard drinks or equivalent per week     Comment: rare-wine    Family  History  Problem Relation Age of Onset  . Breast cancer Mother 10    deceased 92  . Osteoporosis Mother   . Hypertension Mother   . Hypertension Father   . Heart attack Father   . Breast cancer Sister 65    bilateral; currently 50  . Diabetes Brother   . Lung cancer Maternal Grandfather     deceased 45  . Cancer Paternal Grandmother     duodenal; deceased 56     Review of Systems  Constitutional: Positive for diaphoresis. Negative for fever, chills, weight loss and malaise/fatigue.  HENT: Positive for hearing loss. Negative for congestion, ear discharge, ear pain, nosebleeds, sore throat and tinnitus.   Eyes: Negative.   Respiratory: Negative.  Negative for stridor.   Cardiovascular: Positive for palpitations. Negative for chest pain, orthopnea, claudication, leg swelling and PND.  Gastrointestinal: Positive for heartburn. Negative for nausea, vomiting, abdominal pain, diarrhea, constipation, blood in stool and melena.  Genitourinary: Positive for frequency. Negative for dysuria, urgency, hematuria and flank pain.  Musculoskeletal: Positive for myalgias, back pain and joint pain. Negative for falls and neck pain.  Skin: Negative.   Neurological: Positive for dizziness. Negative for tingling, tremors, sensory change, speech change, focal weakness, seizures, loss of consciousness, weakness and headaches.  Endo/Heme/Allergies: Negative.   Psychiatric/Behavioral: Negative.     Objective:  Physical Exam  Constitutional: She is oriented to person,  place, and time. She appears well-developed and well-nourished. No distress.  HENT:  Head: Normocephalic and atraumatic.  Right Ear: External ear normal.  Left Ear: External ear normal.  Nose: Nose normal.  Mouth/Throat: Oropharynx is clear and moist.  Eyes: Conjunctivae and EOM are normal.  Neck: Normal range of motion. Neck supple.  Cardiovascular: Normal rate, regular rhythm, normal heart sounds and intact distal pulses.   No murmur heard. Respiratory: Effort normal and breath sounds normal. No respiratory distress. She has no wheezes.  GI: Soft. Bowel sounds are normal. She exhibits no distension. There is no tenderness.  Musculoskeletal:       Left hip: Normal.       Right knee: Normal.       Left knee: Normal.  Right hip can be flexed to about 110, rotate in 10, out 20, adduct 20 with discomfort on range of motion. Her left knee shows no effusion. Her range of motion is about 0 to 125. She is tender medially. There is no instability.  Neurological: She is alert and oriented to person, place, and time. She has normal strength and normal reflexes. No sensory deficit.  Skin: No rash noted. She is not diaphoretic. No erythema.  Psychiatric: She has a normal mood and affect. Her behavior is normal.    Vitals  Weight: 169 lb Height: 64in Body Surface Area: 1.82 m Body Mass Index: 29.01 kg/m  Pulse: 76 (Regular)  BP: 158/76 (Sitting, Left Arm, Standard)  Imaging Review Plain radiographs demonstrate severe degenerative joint disease of the right hip(s). The bone quality appears to be good for age and reported activity level.  Assessment/Plan:  End stage primary osteoarthritis, right hip(s)  The patient history, physical examination, clinical judgement of the provider and imaging studies are consistent with end stage degenerative joint disease of the right hip(s) and total hip arthroplasty is deemed medically necessary. The treatment options including medical management,  injection therapy, arthroscopy and arthroplasty were discussed at length. The risks and benefits of total hip arthroplasty were presented and reviewed. The risks due to aseptic loosening, infection,  stiffness, dislocation/subluxation,  thromboembolic complications and other imponderables were discussed.  The patient acknowledged the explanation, agreed to proceed with the plan and consent was signed. Patient is being admitted for inpatient treatment for surgery, pain control, PT, OT, prophylactic antibiotics, VTE prophylaxis, progressive ambulation and ADL's and discharge planning.The patient is planning to be discharged home with home health services    PCP: Dr. Velna Hatchet Cardio: Dr. Einar Gip  NO BP or IV in left arm    Ardeen Jourdain, [place]

## 2015-09-08 ENCOUNTER — Inpatient Hospital Stay (HOSPITAL_COMMUNITY): Payer: Medicare Other

## 2015-09-08 ENCOUNTER — Encounter (HOSPITAL_COMMUNITY)
Admission: RE | Disposition: A | Discharge status: Home Health | Payer: Self-pay | Source: Ambulatory Visit | Attending: Orthopedic Surgery

## 2015-09-08 ENCOUNTER — Inpatient Hospital Stay (HOSPITAL_COMMUNITY): Payer: Medicare Other | Admitting: Anesthesiology

## 2015-09-08 ENCOUNTER — Encounter (HOSPITAL_COMMUNITY): Payer: Self-pay

## 2015-09-08 ENCOUNTER — Inpatient Hospital Stay (HOSPITAL_COMMUNITY)
Admission: RE | Admit: 2015-09-08 | Discharge: 2015-09-10 | DRG: 470 | Disposition: A | Discharge status: Home Health | Payer: Medicare Other | Source: Ambulatory Visit | Attending: Orthopedic Surgery | Admitting: Orthopedic Surgery

## 2015-09-08 DIAGNOSIS — K219 Gastro-esophageal reflux disease without esophagitis: Secondary | ICD-10-CM | POA: Diagnosis present

## 2015-09-08 DIAGNOSIS — M169 Osteoarthritis of hip, unspecified: Secondary | ICD-10-CM | POA: Diagnosis present

## 2015-09-08 DIAGNOSIS — I10 Essential (primary) hypertension: Secondary | ICD-10-CM | POA: Diagnosis present

## 2015-09-08 DIAGNOSIS — Z853 Personal history of malignant neoplasm of breast: Secondary | ICD-10-CM | POA: Diagnosis not present

## 2015-09-08 DIAGNOSIS — E0500 Thyrotoxicosis with diffuse goiter without thyrotoxic crisis or storm: Secondary | ICD-10-CM | POA: Diagnosis present

## 2015-09-08 DIAGNOSIS — Z8249 Family history of ischemic heart disease and other diseases of the circulatory system: Secondary | ICD-10-CM | POA: Diagnosis not present

## 2015-09-08 DIAGNOSIS — Z833 Family history of diabetes mellitus: Secondary | ICD-10-CM | POA: Diagnosis not present

## 2015-09-08 DIAGNOSIS — H9190 Unspecified hearing loss, unspecified ear: Secondary | ICD-10-CM | POA: Diagnosis present

## 2015-09-08 DIAGNOSIS — Z8262 Family history of osteoporosis: Secondary | ICD-10-CM

## 2015-09-08 DIAGNOSIS — Z803 Family history of malignant neoplasm of breast: Secondary | ICD-10-CM | POA: Diagnosis not present

## 2015-09-08 DIAGNOSIS — Z801 Family history of malignant neoplasm of trachea, bronchus and lung: Secondary | ICD-10-CM | POA: Diagnosis not present

## 2015-09-08 DIAGNOSIS — Z85828 Personal history of other malignant neoplasm of skin: Secondary | ICD-10-CM

## 2015-09-08 DIAGNOSIS — M1611 Unilateral primary osteoarthritis, right hip: Principal | ICD-10-CM | POA: Diagnosis present

## 2015-09-08 DIAGNOSIS — J45909 Unspecified asthma, uncomplicated: Secondary | ICD-10-CM | POA: Diagnosis present

## 2015-09-08 DIAGNOSIS — Z96649 Presence of unspecified artificial hip joint: Secondary | ICD-10-CM

## 2015-09-08 HISTORY — DX: Osteoarthritis of hip, unspecified: M16.9

## 2015-09-08 HISTORY — DX: Dizziness and giddiness: R42

## 2015-09-08 HISTORY — PX: TOTAL HIP ARTHROPLASTY: SHX124

## 2015-09-08 LAB — TYPE AND SCREEN
ABO/RH(D): A POS
ANTIBODY SCREEN: NEGATIVE

## 2015-09-08 IMAGING — DX DG PORTABLE PELVIS
1 series · 1 of 1 positions shown · non-contrast
Comparison: [DATE]

CLINICAL DATA: Right hip replacement

EXAM:
PORTABLE PELVIS 1-2 VIEWS

[pelvis ap]
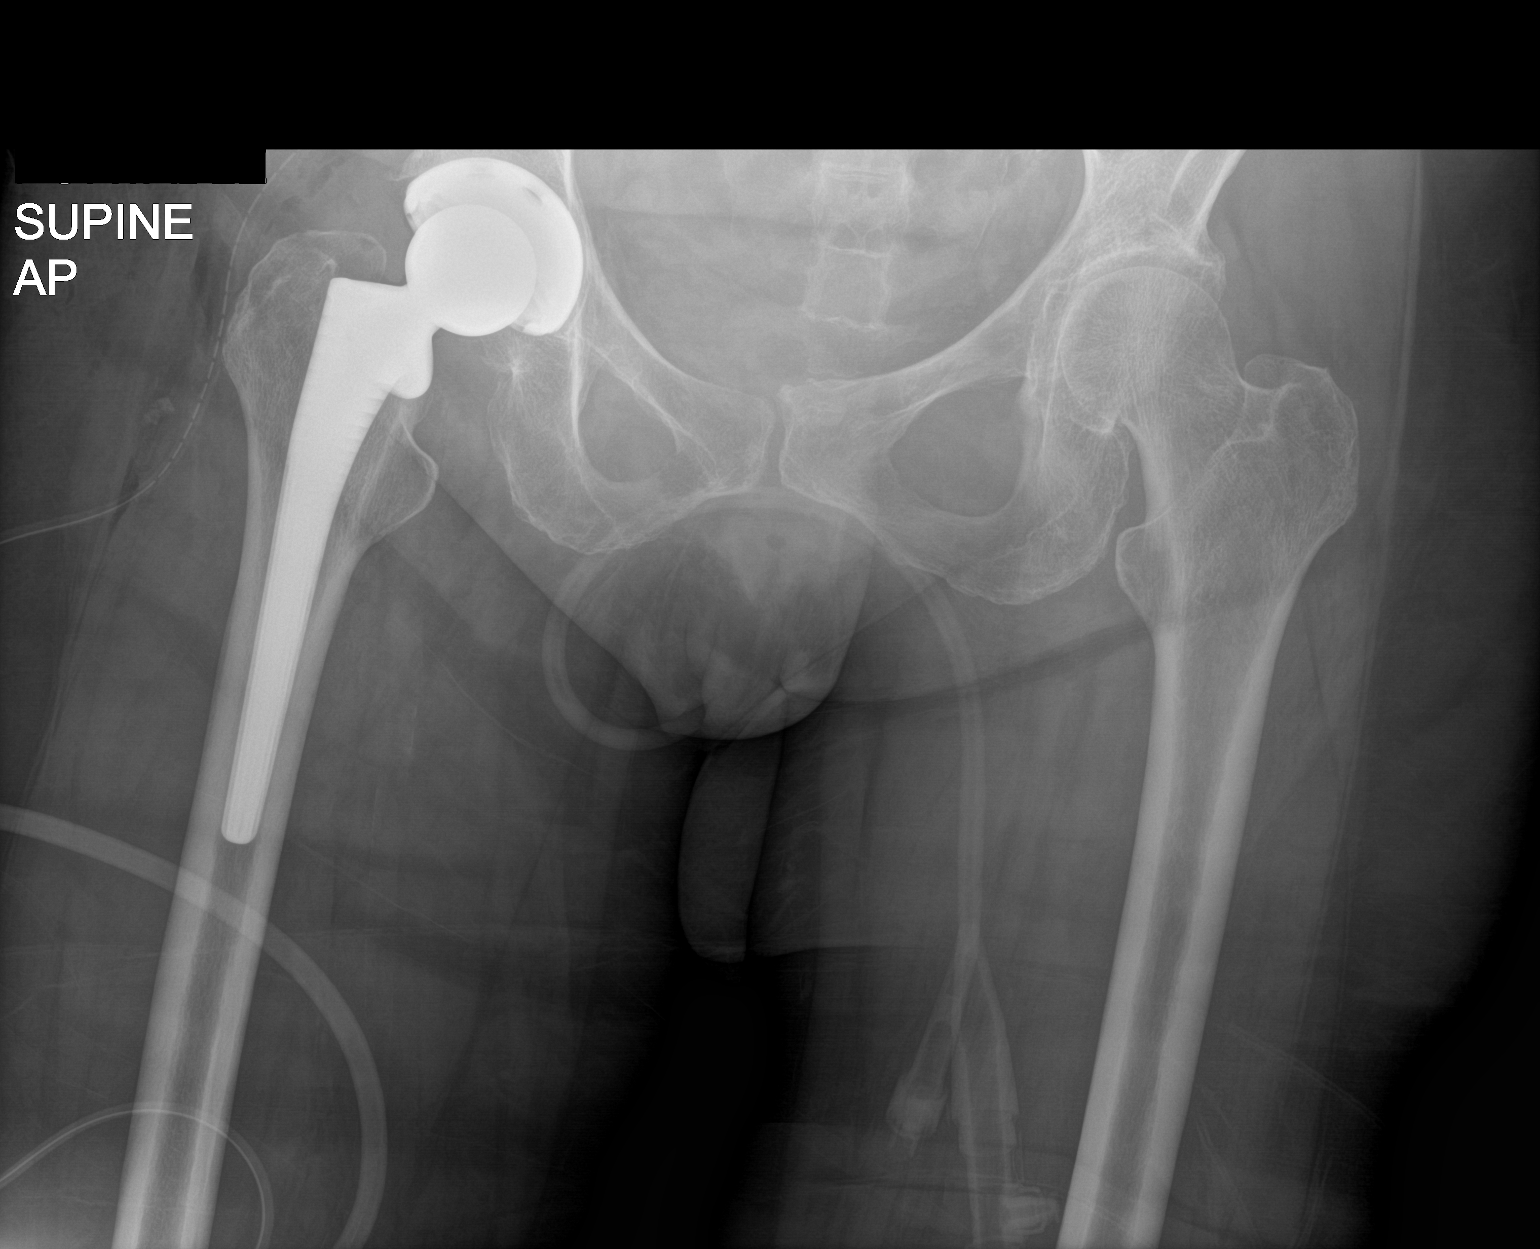

[1 of 1 positions shown; findings below may reference images not displayed]

FINDINGS: Total right hip arthroplasty without periprosthetic fracture or
dislocation. Soft tissue gas and surgical drain.

Rounded lucent appearance overlapping the medial aspect of the right
inferior pubic ramus, approximately 1 cm. There may be associated
cortical thinning. This finding is new compared to prior.
IMPRESSION: 1. No acute finding after right hip arthroplasty.
2. 1 cm lucency over the right inferior pubic ramus which was not
seen on preoperative imaging [DATE]. Recommend departmental
pelvis radiography to differentiate lesion from artifact.

## 2015-09-08 SURGERY — ARTHROPLASTY, HIP, TOTAL, ANTERIOR APPROACH
Anesthesia: Spinal | Site: Hip | Laterality: Right

## 2015-09-08 MED ORDER — DEXAMETHASONE SODIUM PHOSPHATE 10 MG/ML IJ SOLN
10.0000 mg | Freq: Once | INTRAMUSCULAR | Status: AC
  Administered 2015-09-09: 10 mg via INTRAVENOUS
  Filled 2015-09-08: qty 1

## 2015-09-08 MED ORDER — ANASTROZOLE 1 MG PO TABS
1.0000 mg | ORAL_TABLET | Freq: Every day | ORAL | Status: DC
  Administered 2015-09-09 – 2015-09-10 (×2): 1 mg via ORAL
  Filled 2015-09-08 (×3): qty 1

## 2015-09-08 MED ORDER — ACETAMINOPHEN 650 MG RE SUPP: 650.0000 mg | Freq: Four times a day (QID) | RECTAL | Status: DC | PRN

## 2015-09-08 MED ORDER — MIDAZOLAM HCL 2 MG/2ML IJ SOLN
INTRAMUSCULAR | Status: AC
  Filled 2015-09-08: qty 2

## 2015-09-08 MED ORDER — BUPIVACAINE HCL (PF) 0.5 % IJ SOLN
INTRAMUSCULAR | Status: AC
  Filled 2015-09-08: qty 30

## 2015-09-08 MED ORDER — IRBESARTAN 150 MG PO TABS
150.0000 mg | ORAL_TABLET | Freq: Every day | ORAL | Status: DC
  Administered 2015-09-09 – 2015-09-10 (×2): 150 mg via ORAL
  Filled 2015-09-08 (×2): qty 1

## 2015-09-08 MED ORDER — SODIUM CHLORIDE 0.9 % IV SOLN
INTRAVENOUS | Status: DC
  Administered 2015-09-08: 12:00:00 via INTRAVENOUS

## 2015-09-08 MED ORDER — RIVAROXABAN 10 MG PO TABS
10.0000 mg | ORAL_TABLET | Freq: Every day | ORAL | Status: DC
  Administered 2015-09-09 – 2015-09-10 (×2): 10 mg via ORAL
  Filled 2015-09-08 (×2): qty 1

## 2015-09-08 MED ORDER — METOCLOPRAMIDE HCL 5 MG PO TABS: 5.0000 mg | ORAL_TABLET | Freq: Three times a day (TID) | ORAL | Status: DC | PRN

## 2015-09-08 MED ORDER — LACTATED RINGERS IV SOLN
INTRAVENOUS | Status: DC | PRN
  Administered 2015-09-08: 15:00:00 via INTRAVENOUS

## 2015-09-08 MED ORDER — ALBUTEROL SULFATE (2.5 MG/3ML) 0.083% IN NEBU: 3.0000 mL | INHALATION_SOLUTION | RESPIRATORY_TRACT | Status: DC | PRN

## 2015-09-08 MED ORDER — BISACODYL 10 MG RE SUPP: 10.0000 mg | Freq: Every day | RECTAL | Status: DC | PRN

## 2015-09-08 MED ORDER — MEPERIDINE HCL 50 MG/ML IJ SOLN
INTRAMUSCULAR | Status: AC
  Filled 2015-09-08: qty 1

## 2015-09-08 MED ORDER — SODIUM CHLORIDE 0.9 % IV SOLN
INTRAVENOUS | Status: DC
  Administered 2015-09-08: 75 mL/h via INTRAVENOUS

## 2015-09-08 MED ORDER — PHENYLEPHRINE HCL 10 MG/ML IJ SOLN
INTRAMUSCULAR | Status: AC
  Filled 2015-09-08: qty 1

## 2015-09-08 MED ORDER — MEPERIDINE HCL 50 MG/ML IJ SOLN
6.2500 mg | INTRAMUSCULAR | Status: DC | PRN
  Administered 2015-09-08: 12.5 mg via INTRAVENOUS

## 2015-09-08 MED ORDER — BUDESONIDE 0.25 MG/2ML IN SUSP
0.2500 mg | Freq: Two times a day (BID) | RESPIRATORY_TRACT | Status: DC
  Administered 2015-09-08 – 2015-09-10 (×4): 0.25 mg via RESPIRATORY_TRACT
  Filled 2015-09-08 (×4): qty 2

## 2015-09-08 MED ORDER — ONDANSETRON HCL 4 MG/2ML IJ SOLN
INTRAMUSCULAR | Status: AC
  Filled 2015-09-08: qty 2

## 2015-09-08 MED ORDER — FENTANYL CITRATE (PF) 100 MCG/2ML IJ SOLN
INTRAMUSCULAR | Status: DC | PRN
  Administered 2015-09-08: 100 ug via INTRAVENOUS

## 2015-09-08 MED ORDER — TRAMADOL HCL 50 MG PO TABS: 50.0000 mg | ORAL_TABLET | Freq: Four times a day (QID) | ORAL | Status: DC | PRN

## 2015-09-08 MED ORDER — STERILE WATER FOR IRRIGATION IR SOLN
Status: DC | PRN
  Administered 2015-09-08: 3000 mL

## 2015-09-08 MED ORDER — GLYCOPYRROLATE 0.2 MG/ML IJ SOLN
INTRAMUSCULAR | Status: AC
  Filled 2015-09-08: qty 1

## 2015-09-08 MED ORDER — PROPOFOL 10 MG/ML IV BOLUS
INTRAVENOUS | Status: AC
  Filled 2015-09-08: qty 20

## 2015-09-08 MED ORDER — POLYETHYLENE GLYCOL 3350 17 G PO PACK
17.0000 g | PACK | Freq: Every day | ORAL | Status: DC | PRN
  Administered 2015-09-09: 17 g via ORAL
  Filled 2015-09-08: qty 1

## 2015-09-08 MED ORDER — TRANEXAMIC ACID 1000 MG/10ML IV SOLN
1000.0000 mg | INTRAVENOUS | Status: AC
  Administered 2015-09-08: 1000 mg via INTRAVENOUS
  Administered 2015-09-08: 1 mg via INTRAVENOUS
  Filled 2015-09-08: qty 10

## 2015-09-08 MED ORDER — ACETAMINOPHEN 500 MG PO TABS
1000.0000 mg | ORAL_TABLET | Freq: Four times a day (QID) | ORAL | Status: AC
  Administered 2015-09-08 – 2015-09-09 (×4): 1000 mg via ORAL
  Filled 2015-09-08 (×4): qty 2

## 2015-09-08 MED ORDER — 0.9 % SODIUM CHLORIDE (POUR BTL) OPTIME
TOPICAL | Status: DC | PRN
  Administered 2015-09-08: 1000 mL

## 2015-09-08 MED ORDER — GLYCOPYRROLATE 0.2 MG/ML IJ SOLN
INTRAMUSCULAR | Status: DC | PRN
  Administered 2015-09-08: 0.2 mg via INTRAVENOUS

## 2015-09-08 MED ORDER — PHENYLEPHRINE HCL 10 MG/ML IJ SOLN
10.0000 mg | INTRAVENOUS | Status: DC | PRN
  Administered 2015-09-08: 50 ug/min via INTRAVENOUS

## 2015-09-08 MED ORDER — VANCOMYCIN HCL IN DEXTROSE 1-5 GM/200ML-% IV SOLN
1000.0000 mg | INTRAVENOUS | Status: AC
  Administered 2015-09-08: 1000 mg via INTRAVENOUS
  Filled 2015-09-08: qty 200

## 2015-09-08 MED ORDER — FENTANYL CITRATE (PF) 100 MCG/2ML IJ SOLN
INTRAMUSCULAR | Status: AC
  Filled 2015-09-08: qty 2

## 2015-09-08 MED ORDER — OXYCODONE HCL 5 MG PO TABS
5.0000 mg | ORAL_TABLET | ORAL | Status: DC | PRN
  Administered 2015-09-08: 10 mg via ORAL
  Administered 2015-09-08 (×2): 5 mg via ORAL
  Administered 2015-09-09 – 2015-09-10 (×6): 10 mg via ORAL
  Filled 2015-09-08: qty 1
  Filled 2015-09-08 (×2): qty 2
  Filled 2015-09-08: qty 1
  Filled 2015-09-08 (×5): qty 2

## 2015-09-08 MED ORDER — DOCUSATE SODIUM 100 MG PO CAPS
100.0000 mg | ORAL_CAPSULE | Freq: Two times a day (BID) | ORAL | Status: DC
  Administered 2015-09-08 – 2015-09-10 (×4): 100 mg via ORAL
  Filled 2015-09-08 (×3): qty 1

## 2015-09-08 MED ORDER — LIDOCAINE HCL (CARDIAC) 20 MG/ML IV SOLN
INTRAVENOUS | Status: DC | PRN
  Administered 2015-09-08: 50 mg via INTRAVENOUS

## 2015-09-08 MED ORDER — VANCOMYCIN HCL IN DEXTROSE 1-5 GM/200ML-% IV SOLN
1000.0000 mg | Freq: Two times a day (BID) | INTRAVENOUS | Status: AC
Start: 2015-09-09 — End: 2015-09-09
  Administered 2015-09-09: 1000 mg via INTRAVENOUS
  Filled 2015-09-08: qty 200

## 2015-09-08 MED ORDER — FENTANYL CITRATE (PF) 100 MCG/2ML IJ SOLN: 25.0000 ug | INTRAMUSCULAR | Status: DC | PRN

## 2015-09-08 MED ORDER — PROPOFOL 500 MG/50ML IV EMUL
INTRAVENOUS | Status: DC | PRN
  Administered 2015-09-08: 50 ug/kg/min via INTRAVENOUS

## 2015-09-08 MED ORDER — PHENOL 1.4 % MT LIQD: 1.0000 | OROMUCOSAL | Status: DC | PRN

## 2015-09-08 MED ORDER — BUPIVACAINE HCL (PF) 0.25 % IJ SOLN
INTRAMUSCULAR | Status: AC
Start: 2015-09-08 — End: 2015-09-08
  Filled 2015-09-08: qty 30

## 2015-09-08 MED ORDER — DIPHENHYDRAMINE HCL 12.5 MG/5ML PO ELIX: 12.5000 mg | ORAL_SOLUTION | ORAL | Status: DC | PRN

## 2015-09-08 MED ORDER — ACETAMINOPHEN 325 MG PO TABS: 650.0000 mg | ORAL_TABLET | Freq: Four times a day (QID) | ORAL | Status: DC | PRN

## 2015-09-08 MED ORDER — BUPIVACAINE HCL (PF) 0.5 % IJ SOLN
INTRAMUSCULAR | Status: DC | PRN
  Administered 2015-09-08: 3 mL

## 2015-09-08 MED ORDER — ONDANSETRON HCL 4 MG/2ML IJ SOLN
INTRAMUSCULAR | Status: DC | PRN
  Administered 2015-09-08: 4 mg via INTRAVENOUS

## 2015-09-08 MED ORDER — METHOCARBAMOL 500 MG PO TABS
500.0000 mg | ORAL_TABLET | Freq: Four times a day (QID) | ORAL | Status: DC | PRN
  Administered 2015-09-09 (×2): 500 mg via ORAL
  Filled 2015-09-08 (×3): qty 1

## 2015-09-08 MED ORDER — MORPHINE SULFATE (PF) 2 MG/ML IV SOLN: 1.0000 mg | INTRAVENOUS | Status: DC | PRN

## 2015-09-08 MED ORDER — ONDANSETRON HCL 4 MG/2ML IJ SOLN: 4.0000 mg | Freq: Four times a day (QID) | INTRAMUSCULAR | Status: DC | PRN

## 2015-09-08 MED ORDER — ONDANSETRON HCL 4 MG/2ML IJ SOLN: 4.0000 mg | Freq: Once | INTRAMUSCULAR | Status: DC | PRN

## 2015-09-08 MED ORDER — PANTOPRAZOLE SODIUM 40 MG PO TBEC
40.0000 mg | DELAYED_RELEASE_TABLET | Freq: Every day | ORAL | Status: DC
  Filled 2015-09-08: qty 1

## 2015-09-08 MED ORDER — METOCLOPRAMIDE HCL 5 MG/ML IJ SOLN: 5.0000 mg | Freq: Three times a day (TID) | INTRAMUSCULAR | Status: DC | PRN

## 2015-09-08 MED ORDER — ONDANSETRON HCL 4 MG PO TABS: 4.0000 mg | ORAL_TABLET | Freq: Four times a day (QID) | ORAL | Status: DC | PRN

## 2015-09-08 MED ORDER — ACETAMINOPHEN 10 MG/ML IV SOLN
INTRAVENOUS | Status: AC
  Filled 2015-09-08: qty 100

## 2015-09-08 MED ORDER — DEXAMETHASONE SODIUM PHOSPHATE 10 MG/ML IJ SOLN
INTRAMUSCULAR | Status: AC
  Filled 2015-09-08: qty 1

## 2015-09-08 MED ORDER — METHOCARBAMOL 1000 MG/10ML IJ SOLN
500.0000 mg | Freq: Four times a day (QID) | INTRAVENOUS | Status: DC | PRN
  Administered 2015-09-08: 500 mg via INTRAVENOUS
  Filled 2015-09-08: qty 550
  Filled 2015-09-08: qty 5

## 2015-09-08 MED ORDER — CHLORHEXIDINE GLUCONATE 4 % EX LIQD: 60.0000 mL | Freq: Once | CUTANEOUS | Status: DC

## 2015-09-08 MED ORDER — AMLODIPINE BESYLATE 5 MG PO TABS
5.0000 mg | ORAL_TABLET | Freq: Every day | ORAL | Status: DC
  Administered 2015-09-09 – 2015-09-10 (×2): 5 mg via ORAL
  Filled 2015-09-08 (×2): qty 1

## 2015-09-08 MED ORDER — DEXAMETHASONE SODIUM PHOSPHATE 10 MG/ML IJ SOLN
10.0000 mg | Freq: Once | INTRAMUSCULAR | Status: AC
  Administered 2015-09-08: 10 mg via INTRAVENOUS

## 2015-09-08 MED ORDER — ACETAMINOPHEN 10 MG/ML IV SOLN
1000.0000 mg | Freq: Once | INTRAVENOUS | Status: AC
  Administered 2015-09-08: 1000 mg via INTRAVENOUS
  Filled 2015-09-08: qty 100

## 2015-09-08 MED ORDER — PHENYLEPHRINE HCL 10 MG/ML IJ SOLN
INTRAMUSCULAR | Status: DC | PRN
  Administered 2015-09-08 (×3): 80 ug via INTRAVENOUS

## 2015-09-08 MED ORDER — FLEET ENEMA 7-19 GM/118ML RE ENEM: 1.0000 | ENEMA | Freq: Once | RECTAL | Status: DC | PRN

## 2015-09-08 MED ORDER — MENTHOL 3 MG MT LOZG: 1.0000 | LOZENGE | OROMUCOSAL | Status: DC | PRN

## 2015-09-08 MED ORDER — MIDAZOLAM HCL 5 MG/5ML IJ SOLN
INTRAMUSCULAR | Status: DC | PRN
  Administered 2015-09-08: 2 mg via INTRAVENOUS

## 2015-09-08 SURGICAL SUPPLY — 35 items
BAG DECANTER FOR FLEXI CONT (MISCELLANEOUS) ×1 IMPLANT
BAG SPEC THK2 15X12 ZIP CLS (MISCELLANEOUS)
BAG ZIPLOCK 12X15 (MISCELLANEOUS) IMPLANT
BLADE SAG 18X100X1.27 (BLADE) ×3 IMPLANT
CAPT HIP TOTAL 2 ×2 IMPLANT
CLOSURE WOUND 1/2 X4 (GAUZE/BANDAGES/DRESSINGS) ×1
CLOTH BEACON ORANGE TIMEOUT ST (SAFETY) ×3 IMPLANT
COVER PERINEAL POST (MISCELLANEOUS) ×3 IMPLANT
DECANTER SPIKE VIAL GLASS SM (MISCELLANEOUS) ×1 IMPLANT
DRAPE STERI IOBAN 125X83 (DRAPES) ×3 IMPLANT
DRAPE U-SHAPE 47X51 STRL (DRAPES) ×6 IMPLANT
DRSG ADAPTIC 3X8 NADH LF (GAUZE/BANDAGES/DRESSINGS) ×3 IMPLANT
DRSG MEPILEX BORDER 4X4 (GAUZE/BANDAGES/DRESSINGS) ×3 IMPLANT
DRSG MEPILEX BORDER 4X8 (GAUZE/BANDAGES/DRESSINGS) ×3 IMPLANT
DURAPREP 26ML APPLICATOR (WOUND CARE) ×3 IMPLANT
ELECT REM PT RETURN 9FT ADLT (ELECTROSURGICAL) ×3
ELECTRODE REM PT RTRN 9FT ADLT (ELECTROSURGICAL) ×1 IMPLANT
EVACUATOR 1/8 PVC DRAIN (DRAIN) ×3 IMPLANT
GLOVE BIO SURGEON STRL SZ7.5 (GLOVE) ×3 IMPLANT
GLOVE BIO SURGEON STRL SZ8 (GLOVE) ×6 IMPLANT
GLOVE BIOGEL PI IND STRL 8 (GLOVE) ×2 IMPLANT
GLOVE BIOGEL PI INDICATOR 8 (GLOVE) ×12
GOWN STRL REUS W/TWL LRG LVL3 (GOWN DISPOSABLE) ×7 IMPLANT
GOWN STRL REUS W/TWL XL LVL3 (GOWN DISPOSABLE) ×3 IMPLANT
PACK ANTERIOR HIP CUSTOM (KITS) ×3 IMPLANT
STRIP CLOSURE SKIN 1/2X4 (GAUZE/BANDAGES/DRESSINGS) ×2 IMPLANT
SUT ETHIBOND NAB CT1 #1 30IN (SUTURE) ×3 IMPLANT
SUT MNCRL AB 4-0 PS2 18 (SUTURE) ×3 IMPLANT
SUT VIC AB 2-0 CT1 27 (SUTURE) ×6
SUT VIC AB 2-0 CT1 TAPERPNT 27 (SUTURE) ×2 IMPLANT
SUT VLOC 180 0 24IN GS25 (SUTURE) ×3 IMPLANT
SYR 50ML LL SCALE MARK (SYRINGE) IMPLANT
TRAY FOLEY W/METER SILVER 14FR (SET/KITS/TRAYS/PACK) ×3 IMPLANT
TRAY FOLEY W/METER SILVER 16FR (SET/KITS/TRAYS/PACK) ×1 IMPLANT
YANKAUER SUCT BULB TIP 10FT TU (MISCELLANEOUS) ×3 IMPLANT

## 2015-09-08 NOTE — Anesthesia Preprocedure Evaluation (Addendum)
Anesthesia Evaluation  Patient identified by MRN, date of birth, ID band Patient awake    Reviewed: Allergy & Precautions, NPO status , Patient's Chart, lab work & pertinent test results  History of Anesthesia Complications (+) PONV, PROLONGED EMERGENCE, Family history of anesthesia reaction and history of anesthetic complications  Airway Mallampati: III  TM Distance: <3 FB Neck ROM: Full    Dental  (+) Teeth Intact, Dental Advisory Given   Pulmonary asthma ,    Pulmonary exam normal breath sounds clear to auscultation       Cardiovascular Exercise Tolerance: Good hypertension, Pt. on medications (-) angina(-) Past MI Normal cardiovascular exam+ Valvular Problems/Murmurs MR  Rhythm:Regular Rate:Normal     Neuro/Psych BUE numbness, tingling R>L negative psych ROS   GI/Hepatic Neg liver ROS, GERD  Medicated,  Endo/Other  negative endocrine ROS  Renal/GU negative Renal ROS     Musculoskeletal  (+) Arthritis , Osteoarthritis,    Abdominal   Peds  Hematology negative hematology ROS (+) Plt 341k   Anesthesia Other Findings Day of surgery medications reviewed with the patient.  S/p Left mastectomy for breast cancer Ovarian mass BCC   Reproductive/Obstetrics                         Anesthesia Physical Anesthesia Plan  ASA: III  Anesthesia Plan: Spinal   Post-op Pain Management:    Induction:   Airway Management Planned:   Additional Equipment:   Intra-op Plan:   Post-operative Plan:   Informed Consent: I have reviewed the patients History and Physical, chart, labs and discussed the procedure including the risks, benefits and alternatives for the proposed anesthesia with the patient or authorized representative who has indicated his/her understanding and acceptance.   Dental advisory given  Plan Discussed with: CRNA, Anesthesiologist and Surgeon  Anesthesia Plan Comments:  (Discussed risks and benefits of and differences between spinal and general. Discussed risks of spinal including headache, backache, failure, bleeding, infection, and nerve damage. Patient consents to spinal. Questions answered. Coagulation studies and platelet count acceptable.)        Anesthesia Quick Evaluation

## 2015-09-08 NOTE — Progress Notes (Signed)
Utilization review completed.  

## 2015-09-08 NOTE — Op Note (Signed)
OPERATIVE REPORT  PREOPERATIVE DIAGNOSIS: Osteoarthritis of the Right hip.   POSTOPERATIVE DIAGNOSIS: Osteoarthritis of the Right  hip.   PROCEDURE: Right total hip arthroplasty, anterior approach.   SURGEON: Gaynelle Arabian, MD   ASSISTANT: Arlee Muslim, PA-C  ANESTHESIA:  Spinal  ESTIMATED BLOOD LOSS:-225 ml    DRAINS: Hemovac x1.   COMPLICATIONS: None   CONDITION: PACU - hemodynamically stable.   BRIEF CLINICAL NOTE: Sarah Underwood is a 70 y.o. female who has advanced end-  stage arthritis of her Right  hip with progressively worsening pain and  dysfunction.The patient has failed nonoperative management and presents for  total hip arthroplasty.   PROCEDURE IN DETAIL: After successful administration of spinal  anesthetic, the traction boots for the [facility] bed were placed on both  feet and the patient was placed onto the [facility] bed, boots placed into the leg  holders. The Right hip was then isolated from the perineum with plastic  drapes and prepped and draped in the usual sterile fashion. ASIS and  greater trochanter were marked and a oblique incision was made, starting  at about 1 cm lateral and 2 cm distal to the ASIS and coursing towards  the anterior cortex of the femur. The skin was cut with a 10 blade  through subcutaneous tissue to the level of the fascia overlying the  tensor fascia lata muscle. The fascia was then incised in line with the  incision at the junction of the anterior third and posterior 2/3rd. The  muscle was teased off the fascia and then the interval between the TFL  and the rectus was developed. The Hohmann retractor was then placed at  the top of the femoral neck over the capsule. The vessels overlying the  capsule were cauterized and the fat on top of the capsule was removed.  A Hohmann retractor was then placed anterior underneath the rectus  femoris to give exposure to the entire anterior capsule. A T-shaped  capsulotomy was  performed. The edges were tagged and the femoral head  was identified.       Osteophytes are removed off the superior acetabulum.  The femoral neck was then cut in situ with an oscillating saw. Traction  was then applied to the left lower extremity utilizing the [facility]  traction. The femoral head was then removed. Retractors were placed  around the acetabulum and then circumferential removal of the labrum was  performed. Osteophytes were also removed. Reaming starts at 45 mm to  medialize and  Increased in 2 mm increments to 49 mm. We reamed in  approximately 40 degrees of abduction, 20 degrees anteversion. A 50 mm  pinnacle acetabular shell was then impacted in anatomic position under  fluoroscopic guidance with excellent purchase. We did not need to place  any additional dome screws. A 32 mm neutral + 4 marathon liner was then  placed into the acetabular shell.       The femoral lift was then placed along the lateral aspect of the femur  just distal to the vastus ridge. The leg was  externally rotated and capsule  was stripped off the inferior aspect of the femoral neck down to the  level of the lesser trochanter, this was done with electrocautery. The femur was lifted after this was performed. The  leg was then placed and extended in adducted position to essentially delivering the femur. We also removed the capsule superiorly and the  piriformis from the  piriformis fossa to gain excellent exposure of the  proximal femur. Rongeur was used to remove some cancellous bone to get  into the lateral portion of the proximal femur for placement of the  initial starter reamer. The starter broaches was placed  the starter broach  and was shown to go down the center of the canal. Broaching  with the  Corail system was then performed starting at size 8, coursing  Up to size 11. A size 11 had excellent torsional and rotational  and axial stability. The trial high offset neck was then placed  with a 32 +  1 trial head. The hip was then reduced. We confirmed that  the stem was in the canal both on AP and lateral x-rays. It also has excellent sizing. The hip was reduced with outstanding stability through full extension, full external rotation,  and then flexion in adduction internal rotation. AP pelvis was taken  and the leg lengths were measured and found to be exactly equal. Hip  was then dislocated again and the femoral head and neck removed. The  femoral broach was removed. Size 11 Corail stem with a high offset  neck was then impacted into the femur following native anteversion. Has  excellent purchase in the canal. Excellent torsional and rotational and  axial stability. It is confirmed to be in the canal on AP and lateral  fluoroscopic views. The 32 + 1 ceramic head was placed and the hip  reduced with outstanding stability. Again AP pelvis was taken and it  confirmed that the leg lengths were equal. The wound was then copiously  irrigated with saline solution and the capsule reattached and repaired  with Ethibond suture. 20 ml of .25% Bupivicaine injected into the capsule and into the edge of the tensor fascia lata as well as subcutaneous tissue. The fascia overlying the tensor fascia lata was  then closed with a running #1 V-Loc. Subcu was closed with interrupted  2-0 Vicryl and subcuticular running 4-0 Monocryl. Incision was cleaned  and dried. Steri-Strips and a bulky sterile dressing applied. Hemovac  drain was hooked to suction and then she was awakened and transported to  recovery in stable condition.        Please note that a surgical assistant was a medical necessity for this procedure to perform it in a safe and expeditious manner. Assistant was necessary to provide appropriate retraction of vital neurovascular structures and to prevent femoral fracture and allow for anatomic placement of the prosthesis.  Gaynelle Arabian, M.D.

## 2015-09-08 NOTE — Anesthesia Procedure Notes (Signed)
Spinal Patient location during procedure: OR End time: 09/08/2015 1:55 PM Staffing Resident/CRNA: Noralyn Pick D Performed by: anesthesiologist  Preanesthetic Checklist Completed: patient identified, site marked, surgical consent, pre-op evaluation, timeout performed, IV checked, risks and benefits discussed and monitors and equipment checked Spinal Block Patient position: sitting Prep: Betadine Patient monitoring: heart rate, continuous pulse ox and blood pressure Approach: right paramedian Location: L3-4 Injection technique: single-shot Needle Needle type: Sprotte  Needle gauge: 24 G Needle length: 9 cm Assessment Sensory level: T6 Additional Notes Expiration date of kit checked and confirmed. Patient tolerated procedure well, without complications.

## 2015-09-08 NOTE — Interval H&P Note (Signed)
History and Physical Interval Note:  09/08/2015 1:33 PM  Sarah Underwood  has presented today for surgery, with the diagnosis of OA hip right  The various methods of treatment have been discussed with the patient and family. After consideration of risks, benefits and other options for treatment, the patient has consented to  Procedure(s): TOTAL HIP ARTHROPLASTY ANTERIOR APPROACH (Right) as a surgical intervention .  The patient's history has been reviewed, patient examined, no change in status, stable for surgery.  I have reviewed the patient's chart and labs.  Questions were answered to the patient's satisfaction.     [name]

## 2015-09-08 NOTE — Anesthesia Postprocedure Evaluation (Signed)
Anesthesia Post Note  Patient: Sarah Underwood  Procedure(s) Performed: Procedure(s) (LRB): TOTAL HIP ARTHROPLASTY ANTERIOR APPROACH (Right)  Patient location during evaluation: PACU Anesthesia Type: Spinal and MAC Level of consciousness: awake and alert Pain management: pain level controlled Vital Signs Assessment: post-procedure vital signs reviewed and stable Respiratory status: spontaneous breathing and respiratory function stable Cardiovascular status: blood pressure returned to baseline and stable Postop Assessment: spinal receding Anesthetic complications: no    Last Vitals:  Filed Vitals:   09/08/15 1656 09/08/15 1757  BP: 121/71 143/60  Pulse: 54 52  Temp: 36.7 C 36.7 C  Resp: 15 16    Last Pain:  Filed Vitals:   09/08/15 1807  PainSc: 3                  [name],W. EDMOND

## 2015-09-08 NOTE — Transfer of Care (Signed)
Immediate Anesthesia Transfer of Care Note  Patient: Sarah Underwood  Procedure(s) Performed: Procedure(s): TOTAL HIP ARTHROPLASTY ANTERIOR APPROACH (Right)  Patient Location: PACU  Anesthesia Type:Spinal  Level of Consciousness:  sedated, patient cooperative and responds to stimulation  Airway & Oxygen Therapy:Patient Spontanous Breathing and Patient connected to face mask oxgen  Post-op Assessment:  Report given to PACU RN and Post -op Vital signs reviewed and stable  Post vital signs:  Reviewed and stable  Last Vitals:  Filed Vitals:   09/08/15 1145  BP: 154/71  Pulse: 82  Temp: 36.8 C  Resp: 16    Complications: No apparent anesthesia complications

## 2015-09-09 LAB — BASIC METABOLIC PANEL
Anion gap: 5 (ref 5–15)
BUN: 9 mg/dL (ref 6–20)
CALCIUM: 8.5 mg/dL — AB (ref 8.9–10.3)
CO2: 24 mmol/L (ref 22–32)
Chloride: 106 mmol/L (ref 101–111)
Creatinine, Ser: 0.73 mg/dL (ref 0.44–1.00)
GFR calc Af Amer: >60 mL/min (ref >60)
GLUCOSE: 178 mg/dL — AB (ref 65–99)
Potassium: 4.5 mmol/L (ref 3.5–5.1)
Sodium: 135 mmol/L (ref 135–145)

## 2015-09-09 LAB — CBC
HEMATOCRIT: 33.3 % — AB (ref 36.0–46.0)
Hemoglobin: 11.4 g/dL — ABNORMAL LOW (ref 12.0–15.0)
MCH: 29.8 pg (ref 26.0–34.0)
MCHC: 34.2 g/dL (ref 30.0–36.0)
MCV: 86.9 fL (ref 78.0–100.0)
PLATELETS: 282 10*3/uL (ref 150–400)
RBC: 3.83 MIL/uL — ABNORMAL LOW (ref 3.87–5.11)
RDW: 12.8 % (ref 11.5–15.5)
WBC: 12.8 10*3/uL — ABNORMAL HIGH (ref 4.0–10.5)

## 2015-09-09 MED ORDER — OMEPRAZOLE 20 MG PO CPDR
20.0000 mg | DELAYED_RELEASE_CAPSULE | Freq: Every day | ORAL | Status: DC
  Administered 2015-09-09 – 2015-09-10 (×2): 20 mg via ORAL
  Filled 2015-09-09 (×2): qty 1

## 2015-09-09 MED ORDER — NON FORMULARY: 20.0000 mg | Freq: Every day | Status: DC

## 2015-09-09 NOTE — Progress Notes (Signed)
Physical Therapy Treatment Patient Details Name: Sarah Underwood MRN: [id number] DOB: July 11, 1945 Today's Date: 09/09/2015    History of Present Illness Pt s/p R THR with hx of Graves, disease, positional vertigl and Breast CA wtih L mastectomy.    PT Comments    Pt very motivated, progressing well with mobility and hopeful for return home tomorrow.  Follow Up Recommendations  Home health PT     Equipment Recommendations  Rolling walker with 5" wheels    Recommendations for Other Services OT consult     Precautions / Restrictions Precautions Precautions: Fall Restrictions Weight Bearing Restrictions: No Other Position/Activity Restrictions: WBAT    Mobility  Bed Mobility Overal bed mobility: Needs Assistance Bed Mobility: Supine to Sit;Sit to Supine     Supine to sit: Min guard Sit to supine: Min assist   General bed mobility comments: cues for sequence and use of L LE to self assist  Transfers Overall transfer level: Needs assistance Equipment used: Rolling walker (2 wheeled) Transfers: Sit to/from Stand Sit to Stand: Min guard         General transfer comment: cues for LE management and use of UEs to self assist  Ambulation/Gait Ambulation/Gait assistance: Min guard Ambulation Distance (Feet): 300 Feet Assistive device: Rolling walker (2 wheeled) Gait Pattern/deviations: Step-to pattern;Step-through pattern;Shuffle;Trunk flexed     General Gait Details: cues for posture, position from RW and initial sequence   Stairs Stairs: Yes Stairs assistance: Min assist Stair Management: No rails;Step to pattern;Backwards;Forwards;With walker Number of Stairs: 4 General stair comments: single step twice fwd and twice bkwd with cues for sequence and foot/RW placement  Wheelchair Mobility    Modified Rankin (Stroke Patients Only)       Balance                                    Cognition Arousal/Alertness: Awake/alert Behavior During  Therapy: WFL for tasks assessed/performed Overall Cognitive Status: Within Functional Limits for tasks assessed                      Exercises Total Joint Exercises Ankle Circles/Pumps: AROM;Both;15 reps;Supine Quad Sets: AROM;Both;10 reps;Supine Heel Slides: Right;20 reps;Supine;AROM;AAROM Hip ABduction/ADduction: Right;15 reps;Supine;AROM;AAROM    General Comments        Pertinent Vitals/Pain Pain Assessment: 0-10 Pain Score: 2  Pain Location: R hip` Pain Descriptors / Indicators: Aching;Sore Pain Intervention(s): Limited activity within patient's tolerance;Monitored during session;Premedicated before session;Ice applied    Home Living Family/patient expects to be discharged to:: Private residence Living Arrangements: Alone Available Help at Discharge: Family Type of Home: House Home Access: Stairs to enter   Home Layout: Two level Home Equipment: [place] - single point      Prior Function Level of Independence: Independent      Comments: Dtr with assist x 2weeks   PT Goals (current goals can now be found in the care plan section) Acute Rehab PT Goals Patient Stated Goal: Regain IND PT Goal Formulation: With patient Time For Goal Achievement: 09/11/15 Potential to Achieve Goals: Good Progress towards PT goals: Progressing toward goals    Frequency  7X/week    PT Plan Current plan remains appropriate    Co-evaluation             End of Session Equipment Utilized During Treatment: Gait belt Activity Tolerance: Patient tolerated treatment well Patient left: in bed;with call bell/phone within reach;with family/visitor  present     Time: [id number] PT Time Calculation (min) (ACUTE ONLY): 44 min  Charges:  $Gait Training: 23-37 mins $Therapeutic Exercise: 8-22 mins                    G Codes:      Zerick Prevette 09/12/15, 3:31 PM

## 2015-09-09 NOTE — Evaluation (Signed)
Physical Therapy Evaluation Patient Details Name: Sarah Underwood MRN: [id number] DOB: 11-01-1945 Today's Date: 09/09/2015   History of Present Illness  Pt s/p R THR with hx of Graves, disease, positional vertigl and Breast CA wtih L mastectomy.  Clinical Impression  Pt s/p R THR presents with decreased R LE strength/ROM and post op pain limiting functional mobility.  Pt should progress well to dc home with family assist and HHPT follow up.    Follow Up Recommendations Home health PT    Equipment Recommendations  Rolling walker with 5" wheels    Recommendations for Other Services OT consult     Precautions / Restrictions Precautions Precautions: Fall Restrictions Weight Bearing Restrictions: No Other Position/Activity Restrictions: WBAT      Mobility  Bed Mobility Overal bed mobility: Needs Assistance Bed Mobility: Supine to Sit     Supine to sit: Min assist     General bed mobility comments: cues for sequence and use of L LE to self assist  Transfers Overall transfer level: Needs assistance Equipment used: Rolling walker (2 wheeled) Transfers: Sit to/from Stand Sit to Stand: Min assist         General transfer comment: cues for LE management and use of UEs to self assist  Ambulation/Gait Ambulation/Gait assistance: Min assist Ambulation Distance (Feet): 200 Feet Assistive device: Rolling walker (2 wheeled) Gait Pattern/deviations: Step-to pattern;Step-through pattern;Decreased step length - right;Decreased step length - left;Shuffle;Trunk flexed     General Gait Details: cues for posture, position from RW and initial sequence  Stairs            Wheelchair Mobility    Modified Rankin (Stroke Patients Only)       Balance                                             Pertinent Vitals/Pain Pain Assessment: 0-10 Pain Score: 1  Pain Location: R hip and knee Pain Descriptors / Indicators: Aching;Sore Pain Intervention(s):  Limited activity within patient's tolerance;Monitored during session;Premedicated before session;Ice applied    Home Living Family/patient expects to be discharged to:: Private residence Living Arrangements: Alone Available Help at Discharge: Family Type of Home: House Home Access: Stairs to enter   Technical brewer of Steps: 1 Home Layout: Two level [place]: [employer] - single point      Prior Function Level of Independence: Independent         Comments: Dtr with assist x 2weeks     Hand Dominance        Extremity/Trunk Assessment   Upper Extremity Assessment: Overall WFL for tasks assessed           Lower Extremity Assessment: RLE deficits/detail RLE Deficits / Details: Strength at hip 2/5 with AAROM at hip to 85 flex and 20 abd    Cervical / Trunk Assessment: Normal  Communication   Communication: No difficulties  Cognition Arousal/Alertness: Awake/alert Behavior During Therapy: WFL for tasks assessed/performed Overall Cognitive Status: Within Functional Limits for tasks assessed                      General Comments      Exercises Total Joint Exercises Ankle Circles/Pumps: AROM;Both;15 reps;Supine Quad Sets: AROM;Both;10 reps;Supine Heel Slides: AAROM;Right;20 reps;Supine Hip ABduction/ADduction: AAROM;Right;15 reps;Supine      Assessment/Plan    PT Assessment Patient needs continued PT services  PT  Diagnosis Difficulty walking   PT Problem List Decreased strength;Decreased range of motion;Decreased activity tolerance;Decreased mobility;Decreased knowledge of use of DME;Pain  PT Treatment Interventions DME instruction;Gait training;Stair training;Functional mobility training;Therapeutic activities;Therapeutic exercise;Patient/family education   PT Goals (Current goals can be found in the Care Plan section) Acute Rehab PT Goals Patient Stated Goal: Regain IND PT Goal Formulation: With patient Time For Goal Achievement:  09/11/15 Potential to Achieve Goals: Good    Frequency 7X/week   Barriers to discharge        Co-evaluation               End of Session Equipment Utilized During Treatment: Gait belt Activity Tolerance: Patient tolerated treatment well Patient left: in chair;with call bell/phone within reach;with chair alarm set;with family/visitor present Nurse Communication: Mobility status         Time: 0910-0950 PT Time Calculation (min) (ACUTE ONLY): 40 min   Charges:   PT Evaluation $PT Eval Low Complexity: 1 Procedure PT Treatments $Therapeutic Exercise: 8-22 mins   PT G Codes:        Sarah Underwood 10-01-2015, 10:08 AM

## 2015-09-09 NOTE — Care Management Note (Signed)
Case Management Note  Patient Details  Name: Sarah Underwood MRN: [id number] Date of Birth: 1945-11-10  Subjective/Objective:                  TOTAL HIP ARTHROPLASTY ANTERIOR APPROACH (Right) Action/Plan: Discharge planning Expected Discharge Date:  09/10/15               Expected Discharge Plan:  [place]  In-House Referral:     Discharge planning Services  CM Consult  Post Acute Care Choice:    Choice offered to:  Patient  DME Arranged:  3-N-1, Walker rolling DME Agency:  [place]:  PT [facility] Agency:  [place]  Status of Service:  Completed, signed off  Medicare Important Message Given:    Date Medicare IM Given:    Medicare IM give by:    Date Additional Medicare IM Given:    Additional Medicare Important Message give by:     If discussed at [place] of Stay Meetings, dates discussed:    Additional Comments: CM met with pt in room to offer choice of home health agency.  Pt chooses [facility] to render HHPT.  Referral called to Santiago [facility] of [facility].  CM called [facility] DME rep, Lecretia to please deliver the 3n1 and rolling walker to room prior to discharge.  No other CM needs were communicated. Dellie Catholic, RN 09/09/2015, 10:41 AM

## 2015-09-09 NOTE — Evaluation (Signed)
Occupational Therapy Evaluation Patient Details Name: Sarah Underwood MRN: [id number] DOB: 1945-04-27 Today's Date: 09/09/2015    History of Present Illness Pt s/p R THR with hx of Graves, disease, positional vertigl and Breast CA wtih L mastectomy.   Clinical Impression   Pt is s/p THA resulting in the deficits listed below (see OT Problem List).  Pt will benefit from skilled OT to increase their safety and independence with ADL and functional mobility for ADL to facilitate discharge to venue listed below.        Follow Up Recommendations  Home health OT    Equipment Recommendations  None recommended by OT;3 in 1 bedside comode       Precautions / Restrictions Precautions Precautions: Fall Restrictions Weight Bearing Restrictions: No Other Position/Activity Restrictions: WBAT      Mobility Bed Mobility Overal bed mobility: Needs Assistance Bed Mobility: Supine to Sit     Supine to sit: Min assist     General bed mobility comments: pt in chair  Transfers Overall transfer level: Needs assistance Equipment used: Rolling walker (2 wheeled) Transfers: Sit to/from Stand Sit to Stand: Min assist         General transfer comment: cues for LE management and use of UEs to self assist         ADL Overall ADL's : Needs assistance/impaired     Grooming: Set up;Sitting               Lower Body Dressing: Moderate assistance;Sit to/from stand;Cueing for safety;Cueing for sequencing       Toileting- Clothing Manipulation and Hygiene: Moderate assistance;Sit to/from stand;Cueing for safety         General ADL Comments: Pt has a high tub in which she is worried about stepping across.   Explained use of a tub bench.  Being that pts tub sounds unique- reccomend HH A with making decision for tub               Pertinent Vitals/Pain Pain Assessment: 0-10 Pain Score: 0-No pain Pain Location: R hip and knee Pain Descriptors / Indicators: Aching;Sore Pain  Intervention(s): Limited activity within patient's tolerance;Monitored during session;Premedicated before session;Ice applied        Extremity/Trunk Assessment Upper Extremity Assessment Upper Extremity Assessment: Overall WFL for tasks assessed      Cervical / Trunk Assessment Cervical / Trunk Assessment: Normal   Communication Communication Communication: No difficulties   Cognition Arousal/Alertness: Awake/alert Behavior During Therapy: WFL for tasks assessed/performed Overall Cognitive Status: Within Functional Limits for tasks assessed                        Exercises Exercises: Total Joint          Home Living Family/patient expects to be discharged to:: Private residence Living Arrangements: Alone Available Help at Discharge: Family Type of Home: House Home Access: Stairs to enter Technical brewer of Steps: 1   Home Layout: Two level Alternate Level Stairs-Number of Steps: 14 Alternate Level Stairs-Rails: Right Bathroom Shower/Tub: Teacher, early years/pre: Standard     Home Equipment: Cane - single point          Prior Functioning/Environment Level of Independence: Independent        Comments: Dtr with assist x 2weeks    OT Diagnosis: Generalized weakness   OT Problem List: Decreased strength   OT Treatment/Interventions: Self-care/ADL training;Patient/family education;DME and/or AE instruction    OT Goals(Current goals can be  found in the care plan section) Acute Rehab OT Goals Patient Stated Goal: Regain IND OT Goal Formulation: With patient Time For Goal Achievement: 09/23/15 Potential to Achieve Goals: Good ADL Goals Pt Will Perform Grooming: with modified independence;standing Pt Will Perform Lower Body Dressing: with modified independence;sit to/from stand Pt Will Transfer to Toilet: with modified independence;ambulating;bedside commode Pt Will Perform Toileting - Clothing Manipulation and hygiene: with  modified independence;sit to/from stand Pt Will Perform Tub/Shower Transfer: with min guard assist;ambulating;rolling walker;tub bench  OT Frequency: Min 2X/week              End of Session Equipment Utilized During Treatment: Rolling walker Nurse Communication: Mobility status  Activity Tolerance: Patient tolerated treatment well Patient left: in chair   Time: 1142-1157 OT Time Calculation (min): 15 min Charges:  OT General Charges $OT Visit: 1 Procedure OT Evaluation $OT Eval Low Complexity: 1 Procedure G-Codes:    Sarah Underwood October 05, 2015, 12:38 PM

## 2015-09-09 NOTE — Discharge Instructions (Addendum)
° °Dr. Frank Aluisio °Total Joint Specialist °[facility] °[address] °[place], [place] [address] °[phone] ° °ANTERIOR APPROACH TOTAL HIP REPLACEMENT POSTOPERATIVE DIRECTIONS ° ° °Hip Rehabilitation, Guidelines Following Surgery  °The results of a hip operation are greatly improved after range of motion and muscle strengthening exercises. Follow all safety measures which are given to protect your hip. If any of these exercises cause increased pain or swelling in your joint, decrease the amount until you are comfortable again. Then slowly increase the exercises. Call your caregiver if you have problems or questions.  ° °HOME CARE INSTRUCTIONS  °Remove items at home which could result in a fall. This includes throw rugs or furniture in walking pathways.  °· ICE to the affected hip every three hours for 30 minutes at a time and then as needed for pain and swelling.  Continue to use ice on the hip for pain and swelling from surgery. You may notice swelling that will progress down to the foot and ankle.  This is normal after surgery.  Elevate the leg when you are not up walking on it.   °· Continue to use the breathing machine which will help keep your temperature down.  It is common for your temperature to cycle up and down following surgery, especially at night when you are not up moving around and exerting yourself.  The breathing machine keeps your lungs expanded and your temperature down. ° ° °DIET °You may resume your previous home diet once your are discharged from the hospital. ° °DRESSING / WOUND CARE / SHOWERING °You may shower 3 days after surgery, but keep the wounds dry during showering.  You may use an occlusive plastic wrap (Press'n Seal for example), NO SOAKING/SUBMERGING IN THE BATHTUB.  If the bandage gets wet, change with a clean dry gauze.  If the incision gets wet, pat the wound dry with a clean towel. °You may start showering once you are discharged home but do not  submerge the incision under water. Just pat the incision dry and apply a dry gauze dressing on daily. °Change the surgical dressing daily and reapply a dry dressing each time. ° °ACTIVITY °Walk with your walker as instructed. °Use walker as long as suggested by your caregivers. °Avoid periods of inactivity such as sitting longer than an hour when not asleep. This helps prevent blood clots.  °You may resume a sexual relationship in one month or when given the OK by your doctor.  °You may return to work once you are cleared by your doctor.  °Do not drive a car for 6 weeks or until released by you surgeon.  °Do not drive while taking narcotics. ° °WEIGHT BEARING °Weight bearing as tolerated with assist device (walker, cane, etc) as directed, use it as long as suggested by your surgeon or therapist, typically at least 4-6 weeks. ° °POSTOPERATIVE CONSTIPATION PROTOCOL °Constipation - defined medically as fewer than three stools per week and severe constipation as less than one stool per week. ° °One of the most common issues patients have following surgery is constipation.  Even if you have a regular bowel pattern at home, your normal regimen is likely to be disrupted due to multiple reasons following surgery.  Combination of anesthesia, postoperative narcotics, change in appetite and fluid intake all can affect your bowels.  In order to avoid complications following surgery, here are some recommendations in order to help you during your recovery period. ° °Colace (docusate) - Pick up an over-the-counter   form of Colace or another stool softener and take twice a day as long as you are requiring postoperative pain medications.  Take with a full glass of water daily.  If you experience loose stools or diarrhea, hold the colace until you stool forms back up.  If your symptoms do not get better within 1 week or if they get worse, check with your doctor. ° °Dulcolax (bisacodyl) - Pick up over-the-counter and take as directed  by the product packaging as needed to assist with the movement of your bowels.  Take with a full glass of water.  Use this product as needed if not relieved by Colace only.  ° °MiraLax (polyethylene glycol) - Pick up over-the-counter to have on hand.  MiraLax is a solution that will increase the amount of water in your bowels to assist with bowel movements.  Take as directed and can mix with a glass of water, juice, soda, coffee, or tea.  Take if you go more than two days without a movement. °Do not use MiraLax more than once per day. Call your doctor if you are still constipated or irregular after using this medication for 7 days in a row. ° °If you continue to have problems with postoperative constipation, please contact the office for further assistance and recommendations.  If you experience "the worst abdominal pain ever" or develop nausea or vomiting, please contact the office immediatly for further recommendations for treatment. ° °ITCHING ° If you experience itching with your medications, try taking only a single pain pill, or even half a pain pill at a time.  You can also use Benadryl over the counter for itching or also to help with sleep.  ° °TED HOSE STOCKINGS °Wear the elastic stockings on both legs for three weeks following surgery during the day but you may remove then at night for sleeping. ° °MEDICATIONS °See your medication summary on the “After Visit Summary” that the nursing staff will review with you prior to discharge.  You may have some home medications which will be placed on hold until you complete the course of blood thinner medication.  It is important for you to complete the blood thinner medication as prescribed by your surgeon.  Continue your approved medications as instructed at time of discharge. ° °PRECAUTIONS °If you experience chest pain or shortness of breath - call 911 immediately for transfer to the hospital emergency department.  °If you develop a fever greater that 101 F,  purulent drainage from wound, increased redness or drainage from wound, foul odor from the wound/dressing, or calf pain - CONTACT YOUR SURGEON.   °                                                °FOLLOW-UP APPOINTMENTS °Make sure you keep all of your appointments after your operation with your surgeon and caregivers. You should call the office at the above phone number and make an appointment for approximately two weeks after the date of your surgery or on the date instructed by your surgeon outlined in the "After Visit Summary". ° °RANGE OF MOTION AND STRENGTHENING EXERCISES  °These exercises are designed to help you keep full movement of your hip joint. Follow your caregiver's or physical therapist's instructions. Perform all exercises about fifteen times, three times per day or as directed. Exercise both hips, even if you   have had only one joint replacement. These exercises can be done on a training (exercise) mat, on the floor, on a table or on a bed. Use whatever works the best and is most comfortable for you. Use music or television while you are exercising so that the exercises are a pleasant break in your day. This will make your life better with the exercises acting as a break in routine you can look forward to.  Lying on your back, slowly slide your foot toward your buttocks, raising your knee up off the floor. Then slowly slide your foot back down until your leg is straight again.  Lying on your back spread your legs as far apart as you can without causing discomfort.  Lying on your side, raise your upper leg and foot straight up from the floor as far as is comfortable. Slowly lower the leg and repeat.  Lying on your back, tighten up the muscle in the front of your thigh (quadriceps muscles). You can do this by keeping your leg straight and trying to raise your heel off the floor. This helps strengthen the largest muscle supporting your knee.  Lying on your back, tighten up the muscles of your  buttocks both with the legs straight and with the knee bent at a comfortable angle while keeping your heel on the floor.   IF YOU ARE TRANSFERRED TO A SKILLED REHAB FACILITY If the patient is transferred to a skilled rehab facility following release from the hospital, a list of the current medications will be sent to the facility for the patient to continue.  When discharged from the skilled rehab facility, please have the facility set up the patient's [place] prior to being released. Also, the skilled facility will be responsible for providing the patient with their medications at time of release from the facility to include their pain medication, the muscle relaxants, and their blood thinner medication. If the patient is still at the rehab facility at time of the two week follow up appointment, the skilled rehab facility will also need to assist the patient in arranging follow up appointment in our office and any transportation needs.  MAKE SURE YOU:  Understand these instructions.  Get help right away if you are not doing well or get worse.    Pick up stool softner and laxative for home use following surgery while on pain medications. Do not submerge incision under water. Please use good hand washing techniques while changing dressing each day. May shower starting three days after surgery. Please use a clean towel to pat the incision dry following showers. Continue to use ice for pain and swelling after surgery. Do not use any lotions or creams on the incision until instructed by your surgeon.  Take Xarelto for two and a half more weeks, then discontinue Xarelto. Once the patient has completed the blood thinner regimen, then take a Baby 81 mg Aspirin daily for three more weeks.   Information on my medicine - XARELTO (Rivaroxaban)  This medication education was reviewed with me or my healthcare representative as part of my discharge preparation.  The pharmacist that  spoke with me during my hospital stay was:  Rudean Haskell, [facility]  Why was Xarelto prescribed for you? Xarelto was prescribed for you to reduce the risk of blood clots forming after orthopedic surgery. The medical term for these abnormal blood clots is venous thromboembolism (VTE).  What do you need to know about xarelto ? Take your Xarelto  DAILY at the same time every day. °You may take it either with or without food. ° °If you have difficulty swallowing the tablet whole, you may crush it and mix in applesauce just prior to taking your dose. ° °Take Xarelto® exactly as prescribed by your doctor and DO NOT stop taking Xarelto® without talking to the doctor who prescribed the medication.  Stopping without other VTE prevention medication to take the place of Xarelto® may increase your risk of developing a clot. ° °After discharge, you should have regular check-up appointments with your healthcare provider that is prescribing your Xarelto®.   ° °What do you do if you miss a dose? °If you miss a dose, take it as soon as you remember on the same day then continue your regularly scheduled once daily regimen the next day. Do not take two doses of Xarelto® on the same day.  ° °Important Safety Information °A possible side effect of Xarelto® is bleeding. You should call your healthcare provider right away if you experience any of the following: °? Bleeding from an injury or your nose that does not stop. °? Unusual colored urine (red or dark brown) or unusual colored stools (red or black). °? Unusual bruising for unknown reasons. °? A serious fall or if you hit your head (even if there is no bleeding). ° °Some medicines may interact with Xarelto® and might increase your risk of bleeding while on Xarelto®. To help avoid this, consult your healthcare provider or pharmacist prior to using any new prescription or non-prescription medications, including herbals, vitamins, non-steroidal anti-inflammatory drugs  (NSAIDs) and supplements. ° °This website has more information on Xarelto®: www.xarelto.com. ° ° ° ° °

## 2015-09-09 NOTE — Progress Notes (Signed)
Subjective: 1 Day Post-Op Procedure(s) (LRB): TOTAL HIP ARTHROPLASTY ANTERIOR APPROACH (Right) Patient reports pain as mild.   Patient seen in rounds by Dr. Wynelle Link. Patient is well, and has had no acute complaints or problems. Doing well today, probably home tomorrow. We will start therapy today.  Plan is to go Home after hospital stay.  Objective: Vital signs in last 24 hours: Temp:  [97.6 F (36.4 C)-98.3 F (36.8 C)] 97.9 F (36.6 C) (05/18 0518) Pulse Rate:  [52-82] 58 (05/18 0518) Resp:  [15-23] 16 (05/18 0518) BP: (94-154)/(57-88) 133/66 mmHg (05/18 0518) SpO2:  [97 %-100 %] 97 % (05/18 0518) Weight:  [75.751 kg (167 lb)-76.006 kg (167 lb 9 oz)] 75.751 kg (167 lb) (05/17 1700)  Intake/Output from previous day:  Intake/Output Summary (Last 24 hours) at 09/09/15 0730 Last data filed at 09/09/15 0518  Gross per 24 hour  Intake   2280 ml  Output   3605 ml  Net  -1325 ml    Intake/Output this shift: UOP 1650  Labs:  Recent Labs  09/09/15 0408  HGB 11.4*    Recent Labs  09/09/15 0408  WBC 12.8*  RBC 3.83*  HCT 33.3*  PLT 282    Recent Labs  09/09/15 0408  NA 135  K 4.5  CL 106  CO2 24  BUN 9  CREATININE 0.73  GLUCOSE 178*  CALCIUM 8.5*   No results for input(s): LABPT, INR in the last 72 hours.  EXAM General - Patient is Alert, Appropriate and Oriented Extremity - Neurovascular intact Sensation intact distally Dorsiflexion/Plantar flexion intact Dressing - dressing C/D/I Motor Function - intact, moving foot and toes well on exam.  Hemovac pulled without difficulty.  Past Medical History  Diagnosis Date  . Graves disease 2007    thyroid nodule  . Ovarian mass     small solid nodule 8x49mm: Right ovary - stable since 2000  . Breast cancer ([place]) 1995 & 2001    left-left mastectomy, left chest wall recurrence, radiation x 7 weeks  . Basal cell carcinoma 1994    right side of node  . Complication of anesthesia     slow waking up  .  Seasonal allergies   . Skin cancer     basal cell- nose  . PONV (postoperative nausea and vomiting)     pt states occurred with pain meds questions darvocet   . Family history of adverse reaction to anesthesia     pts mother had postop vomiting   . Dysrhythmia   . Hypertension   . Heart murmur   . Numbness and tingling     bilat more per right hand  . Wears glasses   . Hard of hearing   . Benign positional vertigo     with rolling over   . Asthma   . History of bronchitis     last episode 05/2015  . Nocturia   . GERD (gastroesophageal reflux disease)   . Arthritis   . History of radiation therapy   . Vertigo     Assessment/Plan: 1 Day Post-Op Procedure(s) (LRB): TOTAL HIP ARTHROPLASTY ANTERIOR APPROACH (Right) Principal Problem:   OA (osteoarthritis) of hip  Estimated body mass index is 28.65 kg/(m^2) as calculated from the following:   Height as of this encounter: 5\' 4"  (1.626 m).   Weight as of this encounter: 75.751 kg (167 lb). Advance diet Up with therapy Plan for discharge tomorrow Discharge home with home health  DVT Prophylaxis - Xarelto Weight  Bearing As Tolerated right Leg Hemovac Pulled Begin Therapy  Arlee Muslim, PA-C Orthopaedic Surgery 09/09/2015, 7:30 AM

## 2015-09-10 LAB — CBC
HCT: 33 % — ABNORMAL LOW (ref 36.0–46.0)
HEMOGLOBIN: 11.3 g/dL — AB (ref 12.0–15.0)
MCH: 29.1 pg (ref 26.0–34.0)
MCHC: 34.2 g/dL (ref 30.0–36.0)
MCV: 85.1 fL (ref 78.0–100.0)
PLATELETS: 305 10*3/uL (ref 150–400)
RBC: 3.88 MIL/uL (ref 3.87–5.11)
RDW: 13 % (ref 11.5–15.5)
WBC: 17.1 10*3/uL — AB (ref 4.0–10.5)

## 2015-09-10 LAB — BASIC METABOLIC PANEL
ANION GAP: 7 (ref 5–15)
BUN: 8 mg/dL (ref 6–20)
CO2: 28 mmol/L (ref 22–32)
Calcium: 8.8 mg/dL — ABNORMAL LOW (ref 8.9–10.3)
Chloride: 102 mmol/L (ref 101–111)
Creatinine, Ser: 0.68 mg/dL (ref 0.44–1.00)
GFR calc Af Amer: >60 mL/min (ref >60)
Glucose, Bld: 125 mg/dL — ABNORMAL HIGH (ref 65–99)
POTASSIUM: 3.9 mmol/L (ref 3.5–5.1)
SODIUM: 137 mmol/L (ref 135–145)

## 2015-09-10 MED ORDER — OXYCODONE HCL 5 MG PO TABS: 5.0000 mg | ORAL_TABLET | ORAL | Status: DC | PRN

## 2015-09-10 MED ORDER — METHOCARBAMOL 500 MG PO TABS: 500.0000 mg | ORAL_TABLET | Freq: Four times a day (QID) | ORAL | Status: DC | PRN

## 2015-09-10 MED ORDER — TRAMADOL HCL 50 MG PO TABS: 50.0000 mg | ORAL_TABLET | Freq: Four times a day (QID) | ORAL | Status: DC | PRN

## 2015-09-10 MED ORDER — RIVAROXABAN 10 MG PO TABS: 10.0000 mg | ORAL_TABLET | Freq: Every day | ORAL | Status: DC

## 2015-09-10 NOTE — Progress Notes (Signed)
Pt to d/c home with [facility]. No DME needs. AVS reviewed and "My Chart" discussed with pt. Pt capable of verbalizing medications, dressing changes, signs and symptoms of infection, and follow-up appointments. Remains hemodynamically stable. No signs and symptoms of distress. Educated pt to return to ER in the case of SOB, dizziness, or chest pain.  

## 2015-09-10 NOTE — Progress Notes (Signed)
Physical Therapy Treatment Patient Details Name: Sarah Underwood MRN: [id number] DOB: [birth year] Today's Date: 09/10/2015    History of Present Illness Pt s/p R THR with hx of Graves, disease, positional vertigl and Breast CA wtih L mastectomy.    PT Comments    Pt progressing well and eager for return home.  Reviewed therex, stairs and car transfers with dtr present.  Follow Up Recommendations  Home health PT     Equipment Recommendations  Rolling walker with 5" wheels    Recommendations for Other Services OT consult     Precautions / Restrictions Precautions Precautions: Fall Restrictions Weight Bearing Restrictions: No Other Position/Activity Restrictions: WBAT    Mobility  Bed Mobility Overal bed mobility: Needs Assistance Bed Mobility: Supine to Sit     Supine to sit: Supervision     General bed mobility comments: OOB with OT  Transfers Overall transfer level: Needs assistance Equipment used: Rolling walker (2 wheeled) Transfers: Sit to/from Stand Sit to Stand: Supervision         General transfer comment: min cues for LE management and use of UEs to self assist  Ambulation/Gait Ambulation/Gait assistance: Min guard;Supervision Ambulation Distance (Feet): 140 Feet Assistive device: Rolling walker (2 wheeled) Gait Pattern/deviations: Step-to pattern;Step-through pattern;Decreased step length - right;Decreased step length - left;Shuffle;Trunk flexed Gait velocity: decr Gait velocity interpretation: Below normal speed for age/gender General Gait Details: cues for posture, position from RW and initial sequence   Stairs Stairs: Yes Stairs assistance: Min assist Stair Management: No rails;One rail Right;Step to pattern;Forwards;Backwards;With walker;With cane Number of Stairs: 6 General stair comments: single step fwd and bkwd with RW and 4 steps with rail and cane.  Cues for sequence and foot/RW/cane placement  Wheelchair Mobility    Modified  Rankin (Stroke Patients Only)       Balance                                    Cognition Arousal/Alertness: Awake/alert Behavior During Therapy: WFL for tasks assessed/performed Overall Cognitive Status: Within Functional Limits for tasks assessed                      Exercises Total Joint Exercises Ankle Circles/Pumps: AROM;Both;15 reps;Supine Quad Sets: AROM;Both;10 reps;Supine Heel Slides: Right;20 reps;Supine;AROM;AAROM Hip ABduction/ADduction: Right;15 reps;Supine;AROM;AAROM    General Comments        Pertinent Vitals/Pain Pain Assessment: 0-10 Pain Score: 5  Pain Location: R hip Pain Descriptors / Indicators: Aching;Burning;Sore Pain Intervention(s): Limited activity within patient's tolerance;Monitored during session;Premedicated before session;Ice applied    Home Living                      Prior Function            PT Goals (current goals can now be found in the care plan section) Acute Rehab PT Goals Patient Stated Goal: Regain IND PT Goal Formulation: With patient Time For Goal Achievement: 09/11/15 Potential to Achieve Goals: Good Progress towards PT goals: Progressing toward goals    Frequency  7X/week    PT Plan Current plan remains appropriate    Co-evaluation             End of Session Equipment Utilized During Treatment: Gait belt Activity Tolerance: Patient tolerated treatment well Patient left: in chair;with call bell/phone within reach;with family/visitor present     Time: 1000-1038 PT Time Calculation (min) (  ACUTE ONLY): 38 min  Charges:  $Gait Training: 8-22 mins $Therapeutic Exercise: 8-22 mins $Therapeutic Activity: 8-22 mins                    G Codes:      Beya Tipps Sep 12, 2015, 10:49 AM

## 2015-09-10 NOTE — Progress Notes (Signed)
Occupational Therapy Treatment Patient Details Name: Sarah Underwood MRN: [id number] DOB: 1946/03/26 Today's Date: 09/10/2015    History of present illness Pt s/p R THR with hx of Graves, disease, positional vertigl and Breast CA wtih L mastectomy.   OT comments  All OT education completed and pt questions answered. No further acute OT needs; will sign off.  Follow Up Recommendations  Home health OT    Equipment Recommendations  None recommended by OT;3 in 1 bedside comode    Recommendations for Other Services      Precautions / Restrictions Precautions Precautions: Fall Restrictions Weight Bearing Restrictions: No Other Position/Activity Restrictions: WBAT       Mobility Bed Mobility Overal bed mobility: Needs Assistance Bed Mobility: Supine to Sit     Supine to sit: Supervision        Transfers Overall transfer level: Needs assistance Equipment used: Rolling walker (2 wheeled) Transfers: Sit to/from Stand Sit to Stand: Supervision              Balance                                   ADL Overall ADL's : Needs assistance/impaired Eating/Feeding: Independent;Sitting   Grooming: Wash/dry hands;Wash/dry face;Oral care;Supervision/safety;Standing   Upper Body Bathing: Set up;Sitting   Lower Body Bathing: Minimal assistance;Sit to/from stand   Upper Body Dressing : Set up;Sitting   Lower Body Dressing: Minimal assistance;Sit to/from stand   Toilet Transfer: Supervision/safety;Ambulation;Regular Toilet;BSC;RW   Toileting- Clothing Manipulation and Hygiene: Supervision/safety;Sit to/from stand       Functional mobility during ADLs: Supervision/safety;Rolling walker General ADL Comments: Patient practiced bathing, dressing, grooming, toileting this session. All pt questions answered. Will continue to defer tub DME decisions to [facility].      Vision                     Perception     Praxis      Cognition   Behavior  During Therapy: WFL for tasks assessed/performed Overall Cognitive Status: Within Functional Limits for tasks assessed                       Extremity/Trunk Assessment               Exercises     Shoulder Instructions       General Comments      Pertinent Vitals/ Pain       Pain Assessment: 0-10 Pain Score: 5  Pain Location: R hip Pain Descriptors / Indicators: Aching;Sore Pain Intervention(s): Limited activity within patient's tolerance;Monitored during session;Repositioned;Ice applied  Home Living                                          Prior Functioning/Environment              Frequency Min 2X/week     Progress Toward Goals  OT Goals(current goals can now be found in the care plan section)  Progress towards OT goals: Goals met/education completed, patient discharged from OT  Acute Rehab OT Goals Patient Stated Goal: Regain IND  Plan All goals met and education completed, patient discharged from OT services    Co-evaluation                 End  of Session Equipment Utilized During Treatment: Rolling walker   Activity Tolerance Patient tolerated treatment well   Patient Left in chair;with call bell/phone within reach;with family/visitor present   Nurse Communication Mobility status        Time: 9093-1121 OT Time Calculation (min): 44 min  Charges: OT General Charges $OT Visit: 1 Procedure OT Treatments $Self Care/Home Management : 38-52 mins  [place] Welcome A 09/10/2015, 9:00 AM

## 2015-09-10 NOTE — Discharge Summary (Signed)
Physician Discharge Summary   Patient ID: Sarah Underwood MRN: [id number] DOB/AGE: 70-Feb-1947 70 y.o.  Admit date: 09/08/2015 Discharge date: 09-10-2015  Primary Diagnosis:  Osteoarthritis of the Right hip.   Admission Diagnoses:  Past Medical History  Diagnosis Date  . Graves disease 2007    thyroid nodule  . Ovarian mass     small solid nodule 8x48m: Right ovary - stable since 2000  . [place]    left-left mastectomy, left chest wall recurrence, radiation x 7 weeks  . Basal cell carcinoma 1994    right side of node  . Complication of anesthesia     slow waking up  . Seasonal allergies   . Skin cancer     basal cell- nose  . PONV (postoperative nausea and vomiting)     pt states occurred with pain meds questions darvocet   . Family history of adverse reaction to anesthesia     pts mother had postop vomiting   . Dysrhythmia   . Hypertension   . Heart murmur   . Numbness and tingling     bilat more per right hand  . Wears glasses   . Hard of hearing   . Benign positional vertigo     with rolling over   . Asthma   . History of bronchitis     last episode 05/2015  . Nocturia   . GERD (gastroesophageal reflux disease)   . Arthritis   . History of radiation therapy   . Vertigo    Discharge Diagnoses:   Principal Problem:   OA (osteoarthritis) of hip  Estimated body mass index is 28.65 kg/(m^2) as calculated from the following:   Height as of this encounter: '5\' 4"'  (1.626 m).   Weight as of this encounter: 75.751 kg (167 lb).  Procedure(s) (LRB): TOTAL HIP ARTHROPLASTY ANTERIOR APPROACH (Right)   Consults: None  HPI: Sarah KANGASis a 70y.o. female who has advanced end-  stage arthritis of her Right hip with progressively worsening pain and  dysfunction.The patient has failed nonoperative management and presents for  total hip arthroplasty.   Laboratory Data: Admission on 09/08/2015  Component Date Value Ref Range Status  .  WBC 09/09/2015 12.8* 4.0 - 10.5 K/uL Final  . RBC 09/09/2015 3.83* 3.87 - 5.11 MIL/uL Final  . Hemoglobin 09/09/2015 11.4* 12.0 - 15.0 g/dL Final  . HCT 09/09/2015 33.3* 36.0 - 46.0 % Final  . MCV 09/09/2015 86.9  78.0 - 100.0 fL Final  . MCH 09/09/2015 29.8  26.0 - 34.0 pg Final  . MCHC 09/09/2015 34.2  30.0 - 36.0 g/dL Final  . RDW 09/09/2015 12.8  11.5 - 15.5 % Final  . Platelets 09/09/2015 282  150 - 400 K/uL Final  . Sodium 09/09/2015 135  135 - 145 mmol/L Final  . Potassium 09/09/2015 4.5  3.5 - 5.1 mmol/L Final  . Chloride 09/09/2015 106  101 - 111 mmol/L Final  . CO2 09/09/2015 24  22 - 32 mmol/L Final  . Glucose, Bld 09/09/2015 178* 65 - 99 mg/dL Final  . BUN 09/09/2015 9  6 - 20 mg/dL Final  . Creatinine, Ser 09/09/2015 0.73  0.44 - 1.00 mg/dL Final  . Calcium 09/09/2015 8.5* 8.9 - 10.3 mg/dL Final  . GFR calc non Af Amer 09/09/2015 >60  >60 mL/min Final  . GFR calc Af Amer 09/09/2015 >60  >60 mL/min Final   Comment: (NOTE) The eGFR has been calculated using  the CKD EPI equation. This calculation has not been validated in all clinical situations. eGFR's persistently <60 mL/min signify possible Chronic Kidney Disease.   . Anion gap 09/09/2015 5  5 - 15 Final  . WBC 09/10/2015 17.1* 4.0 - 10.5 K/uL Final  . RBC 09/10/2015 3.88  3.87 - 5.11 MIL/uL Final  . Hemoglobin 09/10/2015 11.3* 12.0 - 15.0 g/dL Final  . HCT 09/10/2015 33.0* 36.0 - 46.0 % Final  . MCV 09/10/2015 85.1  78.0 - 100.0 fL Final  . MCH 09/10/2015 29.1  26.0 - 34.0 pg Final  . MCHC 09/10/2015 34.2  30.0 - 36.0 g/dL Final  . RDW 09/10/2015 13.0  11.5 - 15.5 % Final  . Platelets 09/10/2015 305  150 - 400 K/uL Final  . Sodium 09/10/2015 137  135 - 145 mmol/L Final  . Potassium 09/10/2015 3.9  3.5 - 5.1 mmol/L Final  . Chloride 09/10/2015 102  101 - 111 mmol/L Final  . CO2 09/10/2015 28  22 - 32 mmol/L Final  . Glucose, Bld 09/10/2015 125* 65 - 99 mg/dL Final  . BUN 09/10/2015 8  6 - 20 mg/dL Final  .  Creatinine, Ser 09/10/2015 0.68  0.44 - 1.00 mg/dL Final  . Calcium 09/10/2015 8.8* 8.9 - 10.3 mg/dL Final  . GFR calc non Af Amer 09/10/2015 >60  >60 mL/min Final  . GFR calc Af Amer 09/10/2015 >60  >60 mL/min Final   Comment: (NOTE) The eGFR has been calculated using the CKD EPI equation. This calculation has not been validated in all clinical situations. eGFR's persistently <60 mL/min signify possible Chronic Kidney Disease.   Georgiann Hahn gap 09/10/2015 7  5 - 15 Final  Hospital Outpatient Visit on 08/31/2015  Component Date Value Ref Range Status  . aPTT 08/31/2015 30  24 - 37 seconds Final  . WBC 08/31/2015 8.0  4.0 - 10.5 K/uL Final  . RBC 08/31/2015 4.48  3.87 - 5.11 MIL/uL Final  . Hemoglobin 08/31/2015 13.0  12.0 - 15.0 g/dL Final  . HCT 08/31/2015 39.1  36.0 - 46.0 % Final  . MCV 08/31/2015 87.3  78.0 - 100.0 fL Final  . MCH 08/31/2015 29.0  26.0 - 34.0 pg Final  . MCHC 08/31/2015 33.2  30.0 - 36.0 g/dL Final  . RDW 08/31/2015 13.0  11.5 - 15.5 % Final  . Platelets 08/31/2015 341  150 - 400 K/uL Final  . Sodium 08/31/2015 140  135 - 145 mmol/L Final  . Potassium 08/31/2015 4.4  3.5 - 5.1 mmol/L Final  . Chloride 08/31/2015 106  101 - 111 mmol/L Final  . CO2 08/31/2015 26  22 - 32 mmol/L Final  . Glucose, Bld 08/31/2015 92  65 - 99 mg/dL Final  . BUN 08/31/2015 17  6 - 20 mg/dL Final  . Creatinine, Ser 08/31/2015 0.81  0.44 - 1.00 mg/dL Final  . Calcium 08/31/2015 9.3  8.9 - 10.3 mg/dL Final  . Total Protein 08/31/2015 7.6  6.5 - 8.1 g/dL Final  . Albumin 08/31/2015 4.5  3.5 - 5.0 g/dL Final  . AST 08/31/2015 21  15 - 41 U/L Final  . ALT 08/31/2015 18  14 - 54 U/L Final  . Alkaline Phosphatase 08/31/2015 74  38 - 126 U/L Final  . Total Bilirubin 08/31/2015 0.6  0.3 - 1.2 mg/dL Final  . GFR calc non Af Amer 08/31/2015 >60  >60 mL/min Final  . GFR calc Af Amer 08/31/2015 >60  >60 mL/min Final   Comment: (  NOTE) The eGFR has been calculated using the CKD EPI equation. This  calculation has not been validated in all clinical situations. eGFR's persistently <60 mL/min signify possible Chronic Kidney Disease.   . Anion gap 08/31/2015 8  5 - 15 Final  . Prothrombin Time 08/31/2015 13.1  11.6 - 15.2 seconds Final  . INR 08/31/2015 0.97  0.00 - 1.49 Final  . ABO/RH(D) 08/31/2015 A POS   Final  . Antibody Screen 08/31/2015 NEG   Final  . Sample Expiration 08/31/2015 09/11/2015   Final  . Extend sample reason 08/31/2015 NO TRANSFUSIONS OR PREGNANCY IN THE PAST 3 MONTHS   Final  . Color, Urine 08/31/2015 YELLOW  YELLOW Final  . APPearance 08/31/2015 CLEAR  CLEAR Final  . Specific Gravity, Urine 08/31/2015 1.026  1.005 - 1.030 Final  . pH 08/31/2015 6.0  5.0 - 8.0 Final  . Glucose, UA 08/31/2015 NEGATIVE  NEGATIVE mg/dL Final  . Hgb urine dipstick 08/31/2015 NEGATIVE  NEGATIVE Final  . Bilirubin Urine 08/31/2015 NEGATIVE  NEGATIVE Final  . Ketones, ur 08/31/2015 NEGATIVE  NEGATIVE mg/dL Final  . Protein, ur 08/31/2015 NEGATIVE  NEGATIVE mg/dL Final  . Nitrite 08/31/2015 NEGATIVE  NEGATIVE Final  . Leukocytes, UA 08/31/2015 TRACE* NEGATIVE Final  . MRSA, PCR 08/31/2015 NEGATIVE  NEGATIVE Final  . Staphylococcus aureus 08/31/2015 POSITIVE* NEGATIVE Final   Comment:        The Xpert SA Assay (FDA approved for NASAL specimens in patients over 23 years of age), is one component of a comprehensive surveillance program.  Test performance has been validated by [facility] for patients greater than or equal to 70 year old. It is not intended to diagnose infection nor to guide or monitor treatment.   . ABO/RH(D) 08/31/2015 A POS   Final  . Squamous Epithelial / LPF 08/31/2015 0-5* NONE SEEN Final  . WBC, UA 08/31/2015 0-5  0 - 5 WBC/hpf Final  . RBC / HPF 08/31/2015 NONE SEEN  0 - 5 RBC/hpf Final  . Bacteria, UA 08/31/2015 RARE* NONE SEEN Final     X-Rays:Dg Pelvis Portable  09/08/2015  CLINICAL DATA:  Right hip replacement EXAM: PORTABLE PELVIS 1-2 VIEWS  COMPARISON:  12/14/2014 FINDINGS: Total right hip arthroplasty without periprosthetic fracture or dislocation. Soft tissue gas and surgical drain. Rounded lucent appearance overlapping the medial aspect of the right inferior pubic ramus, approximately 1 cm. There may be associated cortical thinning. This finding is new compared to prior. IMPRESSION: 1. No acute finding after right hip arthroplasty. 2. 1 cm lucency over the right inferior pubic ramus which was not seen on preoperative imaging 12/14/2014. Recommend departmental pelvis radiography to differentiate lesion from artifact. Electronically Signed   By: Monte Fantasia M.D.   On: 09/08/2015 16:28   Dg C-arm 1-60 Min-no Report  09/08/2015  CLINICAL DATA: surgery C-ARM 1-60 MINUTES Fluoroscopy was utilized by the requesting physician.  No radiographic interpretation.    EKG:No orders found for this or any previous visit.   Hospital Course: Patient was admitted to [facility] and taken to the OR and underwent the above state procedure without complications.  Patient tolerated the procedure well and was later transferred to the recovery room and then to the orthopaedic floor for postoperative care.  They were given PO and IV analgesics for pain control following their surgery.  They were given 24 hours of postoperative antibiotics of  Anti-infectives    Start     Dose/Rate Route Frequency Ordered Stop  09/09/15 0100  vancomycin (VANCOCIN) IVPB 1000 mg/200 mL premix     1,000 mg 200 mL/hr over 60 Minutes Intravenous Every 12 hours 09/08/15 1706 09/09/15 0140   09/08/15 1125  vancomycin (VANCOCIN) IVPB 1000 mg/200 mL premix     1,000 mg 200 mL/hr over 60 Minutes Intravenous On call to O.R. 09/08/15 1125 09/08/15 1344     and started on DVT prophylaxis in the form of Xarelto.   PT and OT were ordered for total hip protocol.  The patient was allowed to be WBAT with therapy. Discharge planning was consulted to help with postop disposition  and equipment needs.  Patient had a good night on the evening of surgery.  They started to get up OOB with therapy on day one.  Hemovac drain was pulled without difficulty.  Continued to work with therapy into day two.  Dressing was changed on day two and the incision was healing well.  Patient was seen in rounds and was ready to go home on POD 2.  Discharge home with home health Diet - Cardiac diet Follow up - in 2 weeks Activity - WBAT Disposition - Home Condition Upon Discharge - Good D/C Meds - See DC Summary DVT Prophylaxis - Xarelto  Discharge Instructions    Call MD / Call 911    Complete by:  As directed   If you experience chest pain or shortness of breath, CALL 911 and be transported to the hospital emergency room.  If you develope a fever above 101 F, pus (white drainage) or increased drainage or redness at the wound, or calf pain, call your surgeon's office.     Change dressing    Complete by:  As directed   You may change your dressing dressing daily with sterile 4 x 4 inch gauze dressing and paper tape.  Do not submerge the incision under water.     Constipation Prevention    Complete by:  As directed   Drink plenty of fluids.  Prune juice may be helpful.  You may use a stool softener, such as Colace (over the counter) 100 mg twice a day.  Use MiraLax (over the counter) for constipation as needed.     Diet - low sodium heart healthy    Complete by:  As directed      Discharge instructions    Complete by:  As directed   Pick up stool softner and laxative for home use following surgery while on pain medications. Do not submerge incision under water. Please use good hand washing techniques while changing dressing each day. May shower starting three days after surgery. Please use a clean towel to pat the incision dry following showers. Continue to use ice for pain and swelling after surgery. Do not use any lotions or creams on the incision until instructed by your  surgeon.  Total Hip Protocol.  Take Xarelto for two and a half more weeks, then discontinue Xarelto. Once the patient has completed the blood thinner regimen, then take a Baby 81 mg Aspirin daily for three more weeks.  Postoperative Constipation Protocol  Constipation - defined medically as fewer than three stools per week and severe constipation as less than one stool per week.  One of the most common issues patients have following surgery is constipation.  Even if you have a regular bowel pattern at home, your normal regimen is likely to be disrupted due to multiple reasons following surgery.  Combination of anesthesia, postoperative narcotics, change in appetite and  fluid intake all can affect your bowels.  In order to avoid complications following surgery, here are some recommendations in order to help you during your recovery period.  Colace (docusate) - Pick up an over-the-counter form of Colace or another stool softener and take twice a day as long as you are requiring postoperative pain medications.  Take with a full glass of water daily.  If you experience loose stools or diarrhea, hold the colace until you stool forms back up.  If your symptoms do not get better within 1 week or if they get worse, check with your doctor.  Dulcolax (bisacodyl) - Pick up over-the-counter and take as directed by the product packaging as needed to assist with the movement of your bowels.  Take with a full glass of water.  Use this product as needed if not relieved by Colace only.   MiraLax (polyethylene glycol) - Pick up over-the-counter to have on hand.  MiraLax is a solution that will increase the amount of water in your bowels to assist with bowel movements.  Take as directed and can mix with a glass of water, juice, soda, coffee, or tea.  Take if you go more than two days without a movement. Do not use MiraLax more than once per day. Call your doctor if you are still constipated or irregular after using  this medication for 7 days in a row.  If you continue to have problems with postoperative constipation, please contact the office for further assistance and recommendations.  If you experience "the worst abdominal pain ever" or develop nausea or vomiting, please contact the office immediatly for further recommendations for treatment.     Do not sit on low chairs, stoools or toilet seats, as it may be difficult to get up from low surfaces    Complete by:  As directed      Driving restrictions    Complete by:  As directed   No driving until released by the physician.     Increase activity slowly as tolerated    Complete by:  As directed      Lifting restrictions    Complete by:  As directed   No lifting until released by the physician.     Patient may shower    Complete by:  As directed   You may shower without a dressing once there is no drainage.  Do not wash over the wound.  If drainage remains, do not shower until drainage stops.     TED hose    Complete by:  As directed   Use stockings (TED hose) for 3 weeks on both leg(s).  You may remove them at night for sleeping.     Weight bearing as tolerated    Complete by:  As directed   Laterality:  right  Extremity:  Lower            Medication List    STOP taking these medications        Calcium Citrate 250 MG Tabs     Coenzyme Q10 100 MG Tabs     ibuprofen 200 MG tablet  Commonly known as:  ADVIL,MOTRIN     MULTIVITAMIN ADULTS PO     PROBIOTIC DAILY PO     Vitamin D3 2000 units Tabs     Vitamin K2 100 MCG Caps      TAKE these medications        acetaminophen 650 MG CR tablet  Commonly known as:  TYLENOL  Take 1,300 mg by mouth 2 (two) times daily.     albuterol 108 (90 Base) MCG/ACT inhaler  Commonly known as:  PROVENTIL HFA;VENTOLIN HFA  Inhale 2 puffs into the lungs every 4 (four) hours as needed for wheezing.     amLODipine 5 MG tablet  Commonly known as:  NORVASC  Take 5 mg by mouth daily. for blood  pressure     anastrozole 1 MG tablet  Commonly known as:  ARIMIDEX  Take 1 tablet by mouth  daily     fluticasone 110 MCG/ACT inhaler  Commonly known as:  FLOVENT HFA  Inhale 2 puffs into the lungs 2 (two) times daily.     MAGNESIUM CITRATE PO  Take 1 tablet by mouth 2 (two) times daily.     methocarbamol 500 MG tablet  Commonly known as:  ROBAXIN  Take 1 tablet (500 mg total) by mouth every 6 (six) hours as needed for muscle spasms.     omeprazole 20 MG capsule  Commonly known as:  PRILOSEC  Take 20 mg by mouth daily.     OVER THE COUNTER MEDICATION  Apply 1 application topically daily. Cetaphil Eczema Calming Body Wash     OVER THE COUNTER MEDICATION  Apply 1 application topically daily. Cetaphil Eczema Calming Body Moisturizer     oxyCODONE 5 MG immediate release tablet  Commonly known as:  Oxy IR/ROXICODONE  Take 1-2 tablets (5-10 mg total) by mouth every 3 (three) hours as needed for moderate pain or severe pain.     rivaroxaban 10 MG Tabs tablet  Commonly known as:  XARELTO  Take 1 tablet (10 mg total) by mouth daily with breakfast. Take Xarelto for two and a half more weeks, then discontinue Xarelto. Once the patient has completed the blood thinner regimen, then take a Baby 81 mg Aspirin daily for three more weeks.     TEARS NATURALE OP  Place 2 drops into both eyes daily as needed (For dry eyes.).     traMADol 50 MG tablet  Commonly known as:  ULTRAM  Take 1-2 tablets (50-100 mg total) by mouth every 6 (six) hours as needed for moderate pain.     valsartan 160 MG tablet  Commonly known as:  DIOVAN  Take 160 mg by mouth daily.           Follow-up Information    Follow up with [facility].   Why:  home health physical therapy; rolling walker and 3n1 (bedside commode)   Contact information:   [address] [place] [address] [phone]       Follow up with [name], MD. Schedule an appointment as soon as possible for  a visit on 09/21/2015.   Specialty:  Orthopedic Surgery   Why:  Call office at [phone] to setup appointment on Tuesday 09/21/15 with Dr. Wynelle Link.   Contact information:   [address] [place] [address] [phone]       Signed: Arlee Muslim, PA-C Orthopaedic Surgery 09/10/2015, 7:51 AM

## 2015-09-10 NOTE — Progress Notes (Signed)
Subjective: 2 Days Post-Op Procedure(s) (LRB): TOTAL HIP ARTHROPLASTY ANTERIOR APPROACH (Right) Patient reports pain as mild.   Patient seen in rounds with Dr. Wynelle Link. Patient is well, but has had some minor complaints of pain in the hip, requiring pain medications Patient is ready to go home today  Objective: Vital signs in last 24 hours: Temp:  [97.7 F (36.5 C)-98.8 F (37.1 C)] 98.8 F (37.1 C) (05/19 0512) Pulse Rate:  [59-62] 62 (05/19 0512) Resp:  [18] 18 (05/19 0512) BP: (135-141)/(61-69) 135/68 mmHg (05/19 0512) SpO2:  [94 %-99 %] 94 % (05/19 0512)  Intake/Output from previous day:  Intake/Output Summary (Last 24 hours) at 09/10/15 0744 Last data filed at 09/10/15 0513  Gross per 24 hour  Intake   2336 ml  Output   1300 ml  Net   1036 ml    Labs:  Recent Labs  09/09/15 0408 09/10/15 0347  HGB 11.4* 11.3*    Recent Labs  09/09/15 0408 09/10/15 0347  WBC 12.8* 17.1*  RBC 3.83* 3.88  HCT 33.3* 33.0*  PLT 282 305    Recent Labs  09/09/15 0408 09/10/15 0347  NA 135 137  K 4.5 3.9  CL 106 102  CO2 24 28  BUN 9 8  CREATININE 0.73 0.68  GLUCOSE 178* 125*  CALCIUM 8.5* 8.8*   No results for input(s): LABPT, INR in the last 72 hours.  EXAM: General - Patient is Alert, Appropriate and Oriented Extremity - Neurovascular intact Sensation intact distally Dorsiflexion/Plantar flexion intact Incision - clean, dry, no drainage Motor Function - intact, moving foot and toes well on exam.   Assessment/Plan: 2 Days Post-Op Procedure(s) (LRB): TOTAL HIP ARTHROPLASTY ANTERIOR APPROACH (Right) Procedure(s) (LRB): TOTAL HIP ARTHROPLASTY ANTERIOR APPROACH (Right) Past Medical History  Diagnosis Date  . Graves disease 2007    thyroid nodule  . Ovarian mass     small solid nodule 8x23mm: Right ovary - stable since 2000  . Breast cancer ([place]) 1995 & 2001    left-left mastectomy, left chest wall recurrence, radiation x 7 weeks  . Basal cell carcinoma  1994    right side of node  . Complication of anesthesia     slow waking up  . Seasonal allergies   . Skin cancer     basal cell- nose  . PONV (postoperative nausea and vomiting)     pt states occurred with pain meds questions darvocet   . Family history of adverse reaction to anesthesia     pts mother had postop vomiting   . Dysrhythmia   . Hypertension   . Heart murmur   . Numbness and tingling     bilat more per right hand  . Wears glasses   . Hard of hearing   . Benign positional vertigo     with rolling over   . Asthma   . History of bronchitis     last episode 05/2015  . Nocturia   . GERD (gastroesophageal reflux disease)   . Arthritis   . History of radiation therapy   . Vertigo    Principal Problem:   OA (osteoarthritis) of hip  Estimated body mass index is 28.65 kg/(m^2) as calculated from the following:   Height as of this encounter: 5\' 4"  (1.626 m).   Weight as of this encounter: 75.751 kg (167 lb). Up with therapy Discharge home with home health Diet - Cardiac diet Follow up - in 2 weeks Activity - WBAT Disposition - Home Condition  Upon Discharge - Good D/C Meds - See DC Summary DVT Prophylaxis - Xarelto  Arlee Muslim, PA-C Orthopaedic Surgery 09/10/2015, 7:44 AM

## 2015-09-29 ENCOUNTER — Ambulatory Visit (INDEPENDENT_AMBULATORY_CARE_PROVIDER_SITE_OTHER): Payer: Medicare Other | Admitting: Internal Medicine

## 2015-09-29 ENCOUNTER — Encounter: Payer: Self-pay | Admitting: Internal Medicine

## 2015-09-29 VITALS — BP 136/72 | HR 71 | Ht 64.0 in | Wt 165.0 lb

## 2015-09-29 DIAGNOSIS — J4530 Mild persistent asthma, uncomplicated: Secondary | ICD-10-CM

## 2015-09-29 NOTE — Progress Notes (Signed)
Subjective:     Patient ID: Sarah Underwood, female   DOB: 1946-02-01, 70 y.o.   MRN: [id number]  HPI  PCP Velna Hatchet, MD Referred by Dr Everlene Farrier Ortho: Dr Maureen Ralphs  HPI    IOV 06/29/2015  Chief Complaint  Patient presents with  . Pulmonary Consult    Pt referred by Dr. Everlene Farrier for wheezing and acute bronchitis. Pt states she was told by her PCP that she has COPD. Pt states every couple years she gets bronchitis. Pt c/o mild DOE, prod cough with clear and white mucus, wheezing x 2 weeks. Pt denies CP/tightness. Pt states she has just finished doxy and pred taper.    70 year old female referred for recurrent episodes of wheezing bronchitis.  Reports that in 2011 had episode of viral infection followed by severe acute bronchitis with wheezing that lasted several weeks. This again happened in 2013. Then in 2015 had atrial fibrillation was seen by Dr. Einar Gip and apparently was told that she might have COPD based on chest x-ray hyperinflation according to her report. Most recently admitted February 2017 she had a steroid injection into her right hip because of DJD [she's been using a cane for the last few months and is anticipated to have hip replacement surgery]. Then she took a flight to [facility]. Couple of days after getting that got acutely ill with respiratory infection him a cough, chest tightness, wheezing. Upon her return she's been treated with 10 days of doxycycline and prednisone taper which is ending today. And she is significantly better. She reports that in between episodes she occasionally has wheezing particularly on exposure to cold air or with exertion.   Chest x-ray 06/20/2015: Lung fields are clear as personally visualized and per report. Agree with surgical clips on the left side  Exhaled nitric oxide today is 15 ppb but is on the last of 10 days of prednisone and normal  Blood work hemoglobin 12.6 g 06/20/2015.    OV 09/29/2015  Chief Complaint  Patient presents  with  . Follow-up    pt doing well, last day of PT post hip replacement is tomorrow.  Pt notes R sided sinus congestion,PND.     Fu Mild persistent asthma - clinical dx  This is a follow-up to see how she's doing with inhaled corticosteroids. In the interim she's had a hip surgery. She says that Flovent has helped her significantly. She still has occasional wheezing on exertion outside the house or cold air exposure or allergen exposure. Otherwise feels significantly improved. In the last week or so she does not wake up in the middle of the night of the asthma symptoms when she wakes up she has very mild symptoms. She has very slight limitation with her activities because of asthma and she experiences a little short of breath and wheezing because of asthma and she's use albuterol for rescue just once. The 5 point asthma control question of 0.8 suggesting good control. Of note she's never had pulmonary function testing     has a past medical history of Graves disease (2007); Ovarian mass; Breast cancer ([place]) (1995 & 2001); Basal cell carcinoma (1994); Complication of anesthesia; Seasonal allergies; Skin cancer; PONV (postoperative nausea and vomiting); Family history of adverse reaction to anesthesia; Dysrhythmia; Hypertension; Heart murmur; Numbness and tingling; Wears glasses; Hard of hearing; Benign positional vertigo; Asthma; History of bronchitis; Nocturia; GERD (gastroesophageal reflux disease); Arthritis; History of radiation therapy; and Vertigo.   reports that she has never smoked. She has  never used smokeless tobacco.  Past Surgical History  Procedure Laterality Date  . Tonsillectomy and adenoidectomy  1950  . Mastectomy Left 3/95    Stage IIb 0/14 LN ER/ PR + with mastectomy - Dr. Mendel Ryder  . Breast biopsy Right 1987    benign fibroadenoma  . Colposcopy w/ biopsy / curettage  03/12/12    benign, but + HR HPV  . Breast cancer recurrence Left 01/2000    at site of left chest wall after  previous mastectomy with removal and treatment with radiaton.  . Ovary surgery  1974    lt  . Carpal tunnel release Right 05/08/2013    Procedure: RIGHT CARPAL TUNNEL RELEASE;  Surgeon: Cammie Sickle., MD;  Location: [facility];  Service: Orthopedics;  Laterality: Right;  . Basal cell removal  7/94    right nose  . Cataract extraction      03/20/14 left eye, 12/15 right eye  . Eye surgery      bilat cataract surgery   . Tonsillectomy    . Mohs surgery      right nare  . Appendectomy    . Total hip arthroplasty Right 09/08/2015    Procedure: TOTAL HIP ARTHROPLASTY ANTERIOR APPROACH;  Surgeon: Gaynelle Arabian, MD;  Location: [facility];  Service: Orthopedics;  Laterality: Right;    Allergies  Allergen Reactions  . Erythromycin Nausea And Vomiting  . Penicillins Hives and Other (See Comments)    Has patient had a PCN reaction causing immediate rash, facial/tongue/throat swelling, SOB or lightheadedness with hypotension: no Has patient had a PCN reaction causing severe rash involving mucus membranes or skin necrosis: no Has patient had a PCN reaction that required hospitalization no Has patient had a PCN reaction occurring within the last 10 years: no If all of the above answers are "NO", then Underwood proceed with Cephalosporin use.   Marland Kitchen Hydrochlorothiazide Rash    Pt states caused a sunburn type reaction   . Sulfa Antibiotics Rash    Immunization History  Administered Date(s) Administered  . Influenza,inj,Quad PF,36+ Mos 02/23/2015  . Pneumococcal Polysaccharide-23 04/24/2012    Family History  Problem Relation Age of Onset  . Breast cancer Mother 74    deceased 11  . Osteoporosis Mother   . Hypertension Mother   . Hypertension Father   . Heart attack Father   . Breast cancer Sister 55    bilateral; currently 36  . Diabetes Brother   . Lung cancer Maternal Grandfather     deceased 33  . Cancer Paternal Grandmother     duodenal; deceased 54     Current  outpatient prescriptions:  .  acetaminophen (TYLENOL) 650 MG CR tablet, Take 1,300 mg by mouth 2 (two) times daily., Disp: , Rfl:  .  albuterol (PROVENTIL HFA;VENTOLIN HFA) 108 (90 Base) MCG/ACT inhaler, Inhale 2 puffs into the lungs every 4 (four) hours as needed for wheezing., Disp: 1 Inhaler, Rfl: 3 .  amLODipine (NORVASC) 5 MG tablet, Take 5 mg by mouth daily. for blood pressure, Disp: , Rfl: 5 .  anastrozole (ARIMIDEX) 1 MG tablet, Take 1 tablet by mouth  daily, Disp: 90 tablet, Rfl: 3 .  Artificial Tear Solution (TEARS NATURALE OP), Place 2 drops into both eyes daily as needed (For dry eyes.)., Disp: , Rfl:  .  fluticasone (FLOVENT HFA) 110 MCG/ACT inhaler, Inhale 2 puffs into the lungs 2 (two) times daily., Disp: 1 Inhaler, Rfl: 12 .  MAGNESIUM CITRATE PO, Take  1 tablet by mouth 2 (two) times daily., Disp: , Rfl:  .  methocarbamol (ROBAXIN) 500 MG tablet, Take 1 tablet (500 mg total) by mouth every 6 (six) hours as needed for muscle spasms., Disp: 90 tablet, Rfl: 0 .  omeprazole (PRILOSEC) 20 MG capsule, Take 20 mg by mouth daily. , Disp: , Rfl:  .  OVER THE COUNTER MEDICATION, Apply 1 application topically daily. Cetaphil Eczema Calming Body Wash, Disp: , Rfl:  .  OVER THE COUNTER MEDICATION, Apply 1 application topically daily. Cetaphil Eczema Calming Body Moisturizer, Disp: , Rfl:  .  traMADol (ULTRAM) 50 MG tablet, Take 1-2 tablets (50-100 mg total) by mouth every 6 (six) hours as needed for moderate pain., Disp: 90 tablet, Rfl: 1 .  valsartan (DIOVAN) 160 MG tablet, Take 160 mg by mouth daily., Disp: , Rfl: 11     Review of Systems     Objective:   Physical Exam  Constitutional: She is oriented to person, place, and time. She appears well-developed and well-nourished. No distress.  HENT:  Head: Normocephalic and atraumatic.  Right Ear: External ear normal.  Left Ear: External ear normal.  Mouth/Throat: Oropharynx is clear and moist. No oropharyngeal exudate.  Eyes:  Conjunctivae and EOM are normal. Pupils are equal, round, and reactive to light. Right eye exhibits no discharge. Left eye exhibits no discharge. No scleral icterus.  Neck: Normal range of motion. Neck supple. No JVD present. No tracheal deviation present. No thyromegaly present.  Cardiovascular: Normal rate, regular rhythm, normal heart sounds and intact distal pulses.  Exam reveals no gallop and no friction rub.   No murmur heard. Pulmonary/Chest: Effort normal and breath sounds normal. No respiratory distress. She has no wheezes. She has no rales. She exhibits no tenderness.  Abdominal: Soft. Bowel sounds are normal. She exhibits no distension and no mass. There is no tenderness. There is no rebound and no guarding.  Musculoskeletal: Normal range of motion. She exhibits no edema or tenderness.  Lymphadenopathy:    She has no cervical adenopathy.  Neurological: She is alert and oriented to person, place, and time. She has normal reflexes. No cranial nerve deficit. She exhibits normal muscle tone. Coordination normal.  Skin: Skin is warm and dry. No rash noted. She is not diaphoretic. No erythema. No pallor.  Psychiatric: She has a normal mood and affect. Her behavior is normal. Judgment and thought content normal.  Vitals reviewed.  Filed Vitals:   09/29/15 1334  BP: 136/72  Pulse: 71  Height: 5\' 4"  (1.626 m)  Weight: 165 lb (74.844 kg)  SpO2: 99%        Assessment:       ICD-9-CM ICD-10-CM   1. Mild persistent asthma, uncomplicated 123456 [id number] Pulmonary function test       Plan:       Much better and well controlled overall  Plan Continue flovent scheduled as before Use albuterol as needed FLu shot in fall  Followup PFT in 6 months ROV with Dr Chase Caller in 6 months after PFT   - at followup will do ACQ +/- FeNO testing    Dr. Brand Males, M.D., [facility].C.P Pulmonary and Critical Care Medicine Staff Physician [place] Pager: [phone], If no answer or between  15:00h - 7:00h: call [phone]  09/29/2015 1:54 PM

## 2015-09-29 NOTE — Patient Instructions (Signed)
ICD-9-CM ICD-10-CM   1. Mild persistent asthma, uncomplicated 123456 [id number]     Much better and well controlled overall  Plan Continue flovent scheduled as before Use albuterol as needed FLu shot in fall  Followup PFT in 6 months ROV with Dr Chase Caller in 6 months after PFT   - at followup will do ACQ +/- FeNO testing

## 2015-12-14 ENCOUNTER — Other Ambulatory Visit: Payer: Self-pay | Admitting: Oncology

## 2016-03-28 ENCOUNTER — Ambulatory Visit: Payer: Medicare Other | Admitting: Internal Medicine

## 2016-03-30 ENCOUNTER — Ambulatory Visit (INDEPENDENT_AMBULATORY_CARE_PROVIDER_SITE_OTHER): Payer: Medicare Other | Admitting: Internal Medicine

## 2016-03-30 ENCOUNTER — Encounter: Payer: Self-pay | Admitting: Internal Medicine

## 2016-03-30 VITALS — BP 118/64 | HR 62 | Ht 64.0 in | Wt 149.0 lb

## 2016-03-30 DIAGNOSIS — J4530 Mild persistent asthma, uncomplicated: Secondary | ICD-10-CM

## 2016-03-30 DIAGNOSIS — R0982 Postnasal drip: Secondary | ICD-10-CM | POA: Diagnosis not present

## 2016-03-30 LAB — PULMONARY FUNCTION TEST
DL/VA % pred: 87 %
DL/VA: 4.24 ml/min/mmHg/L
DLCO COR: 18.12 ml/min/mmHg
DLCO cor % pred: 72 %
DLCO unc % pred: 74 %
DLCO unc: 18.55 ml/min/mmHg
FEF 25-75 Post: 3.78 L/sec
FEF 25-75 Pre: 3.16 L/sec
FEF2575-%Change-Post: 19 %
FEF2575-%PRED-PRE: 166 %
FEF2575-%Pred-Post: 198 %
FEV1-%Change-Post: 2 %
FEV1-%PRED-PRE: 109 %
FEV1-%Pred-Post: 112 %
FEV1-POST: 2.59 L
FEV1-Pre: 2.52 L
FEV1FVC-%Change-Post: 3 %
FEV1FVC-%Pred-Pre: 112 %
FEV6-%Change-Post: 0 %
FEV6-%PRED-POST: 101 %
FEV6-%PRED-PRE: 101 %
FEV6-POST: 2.93 L
FEV6-Pre: 2.96 L
FEV6FVC-%CHANGE-POST: 0 %
FEV6FVC-%PRED-POST: 105 %
FEV6FVC-%Pred-Pre: 105 %
FVC-%CHANGE-POST: 0 %
FVC-%PRED-POST: 96 %
FVC-%PRED-PRE: 97 %
FVC-POST: 2.93 L
FVC-PRE: 2.96 L
POST FEV6/FVC RATIO: 100 %
PRE FEV1/FVC RATIO: 85 %
Post FEV1/FVC ratio: 88 %
Pre FEV6/FVC Ratio: 100 %
RV % pred: 73 %
RV: 1.62 L
TLC % PRED: 90 %
TLC: 4.64 L

## 2016-03-30 NOTE — Patient Instructions (Signed)
ICD-9-CM ICD-10-CM   1. Mild persistent asthma, uncomplicated 123456 [id number]   2. Post-nasal drainage 473.9 R09.82     Mild increase in symptom level due to cold weather and post nasal drainage  Plan We discussed several different step-up strategies. And resolved - continue daily flovent; - start nasal steroid daily - that you have at home - use albuterol before morning walks - if worsening or not better call us to step up inhaler therapy v adding singulair v short course prednisone  Followup  84months or sooner

## 2016-03-30 NOTE — Progress Notes (Signed)
Subjective:     Patient ID: Sarah Underwood, female   DOB: 06-29-1945, 70 y.o.   MRN: [id number]  HPI   PCP Sarah Hatchet, MD Referred by Sarah Underwood Ortho: Sarah Underwood  HPI    IOV 06/29/2015  Chief Complaint  Patient presents with  . Pulmonary Consult    Pt referred by Sarah. Sarah Underwood for wheezing and acute bronchitis. Pt states she was told by her PCP that she has COPD. Pt states every couple years she gets bronchitis. Pt c/o mild DOE, prod cough with clear and white mucus, wheezing x 2 weeks. Pt denies CP/tightness. Pt states she has just finished doxy and pred taper.    70 year old female referred for recurrent episodes of wheezing bronchitis.  Reports that in 2011 had episode of viral infection followed by severe acute bronchitis with wheezing that lasted several weeks. This again happened in 2013. Then in 2015 had atrial fibrillation was seen by Sarah. Einar Gip and apparently was told that she might have COPD based on chest x-ray hyperinflation according to her report. Most recently admitted February 2017 she had a steroid injection into her right hip because of DJD [she's been using a cane for the last few months and is anticipated to have hip replacement surgery]. Then she took a flight to [facility]. Couple of days after getting that got acutely ill with respiratory infection him a cough, chest tightness, wheezing. Upon her return she's been treated with 10 days of doxycycline and prednisone taper which is ending today. And she is significantly better. She reports that in between episodes she occasionally has wheezing particularly on exposure to cold air or with exertion.   Chest x-ray 06/20/2015: Lung fields are clear as personally visualized and per report. Agree with surgical clips on the left side  Exhaled nitric oxide today is 15 ppb but is on the last of 10 days of prednisone and normal  Blood work hemoglobin 12.6 g 06/20/2015.   reports that she has never smoked. She has never used  smokeless tobacco.    OV 09/29/2015  Chief Complaint  Patient presents with  . Follow-up    pt doing well, last day of PT post hip replacement is tomorrow.  Pt notes R sided sinus congestion,PND.     Fu Mild persistent asthma - clinical dx  This is a follow-up to see how she's doing with inhaled corticosteroids. In the interim she's had a hip surgery. She says that Flovent has helped her significantly. She still has occasional wheezing on exertion outside the house or cold air exposure or allergen exposure. Otherwise feels significantly improved. In the last week or so she does not wake up in the middle of the night of the asthma symptoms when she wakes up she has very mild symptoms. She has very slight limitation with her activities because of asthma and she experiences a little short of breath and wheezing because of asthma and she's use albuterol for rescue just once. The 5 point asthma control question of 0.8 suggesting good control. Of note she's never had pulmonary function testing  OV 03/30/2016  Chief Complaint  Patient presents with  . Follow-up    Pt. had her PFT today, Pt. states her breathing has been doing well, but has noticed the cold weather has affected her breathing with her getting sob more quicky, Pt. has some sinus congestion     Follow-up mild persistent asthma-clinical diagnosis  She is here for routine follow-up. She's been on Flovent  for over a year according to history. She says it has made a significant difference in her respiratory health. She no longer has significant chest tightness cough or wheezing as much as she used to. Albuterol use is been 0. However in the last few weeks because of the change in weather and during the morning walks she notices increased chest tightness and dry cough. She feels albuterol will help her but she has not resorted to using it because the symptoms are transient. When she wakes up in the morning she does have symptoms. There is  significantly worse and a moderate level currently than her baseline. She attributes this to cold weather. She is also having postnasal drainage.  On function test today is normal other than slightly reduced DLCO. She prefers simple line of step up therapy management for this worsening symptoms . Asthma control questionnaire today is 1.4 and worse than baseline     Results for Sarah Underwood (MRN [id number]) as of 03/30/2016 11:15  Ref. Range 03/30/2016 09:48  FVC-Pre Latest Units: L 2.96  FVC-%Pred-Pre Latest Units: % 97  FEV1-Pre Latest Units: L 2.52  FEV1-%Pred-Pre Latest Units: % 109  Pre FEV1/FVC ratio Latest Units: % 85  FEV1FVC-%Pred-Pre Latest Units: % 112   Results for Sarah Underwood (MRN [id number]) as of 03/30/2016 11:15  Ref. Range 03/30/2016 09:48  FVC-Post Latest Units: L 2.93  FVC-%Pred-Post Latest Units: % 96  FVC-%Change-Post Latest Units: % 0  FEV1-Post Latest Units: L 2.59  FEV1-%Pred-Post Latest Units: % 112  FEV1-%Change-Post Latest Units: % 2  Results for Sarah Underwood (MRN [id number]) as of 03/30/2016 11:15  Ref. Range 03/30/2016 09:48  DLCO cor Latest Units: ml/min/mmHg 18.12  DLCO cor % pred Latest Units: % 72  Results for Sarah Underwood (MRN [id number]) as of 03/30/2016 11:15  Ref. Range 03/30/2016 09:48  TLC Latest Units: L 4.64  TLC % pred Latest Units: % 90     has a past medical history of Arthritis; Asthma; Basal cell carcinoma (1994); Benign positional vertigo; Breast cancer ([place]) (1995 & 2001); Complication of anesthesia; Dysrhythmia; Family history of adverse reaction to anesthesia; GERD (gastroesophageal reflux disease); Graves disease (2007); Hard of hearing; Heart murmur; History of bronchitis; History of radiation therapy; Hypertension; Nocturia; Numbness and tingling; Ovarian mass; PONV (postoperative nausea and vomiting); Seasonal allergies; Skin cancer; Vertigo; and Wears glasses.   reports that she has never smoked. She has never used  smokeless tobacco.  Past Surgical History:  Procedure Laterality Date  . APPENDECTOMY    . basal cell removal  7/94   right nose  . BREAST BIOPSY Right 1987   benign fibroadenoma  . breast cancer recurrence Left 01/2000   at site of left chest wall after previous mastectomy with removal and treatment with radiaton.  Marland Kitchen CARPAL TUNNEL RELEASE Right 05/08/2013   Procedure: RIGHT CARPAL TUNNEL RELEASE;  Surgeon: Cammie Sickle., MD;  Location: [facility];  Service: Orthopedics;  Laterality: Right;  . CATARACT EXTRACTION     03/20/14 left eye, 12/15 right eye  . COLPOSCOPY W/ BIOPSY / CURETTAGE  03/12/12   benign, but + HR HPV  . EYE SURGERY     bilat cataract surgery   . MASTECTOMY Left 3/95   Stage IIb 0/14 LN ER/ PR + with mastectomy - Sarah. Mendel Ryder  . MOHS SURGERY     right nare  . [place]   lt  . TONSILLECTOMY    .  TONSILLECTOMY AND ADENOIDECTOMY  1950  . TOTAL HIP ARTHROPLASTY Right 09/08/2015   Procedure: TOTAL HIP ARTHROPLASTY ANTERIOR APPROACH;  Surgeon: Gaynelle Arabian, MD;  Location: [facility];  Service: Orthopedics;  Laterality: Right;    Allergies  Allergen Reactions  . Erythromycin Nausea And Vomiting  . Penicillins Hives and Other (See Comments)    e.   . Hydrochlorothiazide Rash    Pt states caused a sunburn type reaction   . Sulfa Antibiotics Rash    Immunization History  Administered Date(s) Administered  . Influenza, High Dose Seasonal PF 02/14/2016  . Influenza,inj,Quad PF,36+ Mos 02/23/2015  . Pneumococcal Polysaccharide-23 04/24/2012    Family History  Problem Relation Age of Onset  . Breast cancer Mother 48    deceased 64  . Osteoporosis Mother   . Hypertension Mother   . Hypertension Father   . Heart attack Father   . Breast cancer Sister 14    bilateral; currently 67  . Diabetes Brother   . Lung cancer Maternal Grandfather     deceased 85  . Cancer Paternal Grandmother     duodenal; deceased 56     Current  Outpatient Prescriptions:  .  acetaminophen (TYLENOL) 650 MG CR tablet, Take 1,300 mg by mouth 2 (two) times daily., Disp: , Rfl:  .  albuterol (PROVENTIL HFA;VENTOLIN HFA) 108 (90 Base) MCG/ACT inhaler, Inhale 2 puffs into the lungs every 4 (four) hours as needed for wheezing., Disp: 1 Inhaler, Rfl: 3 .  amLODipine (NORVASC) 5 MG tablet, Take 5 mg by mouth daily. for blood pressure, Disp: , Rfl: 5 .  anastrozole (ARIMIDEX) 1 MG tablet, Take 1 tablet by mouth  daily, Disp: 90 tablet, Rfl: 3 .  Artificial Tear Solution (TEARS NATURALE OP), Place 2 drops into both eyes daily as needed (For dry eyes.)., Disp: , Rfl:  .  Cetirizine HCl (ZYRTEC ALLERGY PO), Take by mouth., Disp: , Rfl:  .  Cholecalciferol (VITAMIN D) 2000 units CAPS, Take by mouth., Disp: , Rfl:  .  fluticasone (FLOVENT HFA) 110 MCG/ACT inhaler, Inhale 2 puffs into the lungs 2 (two) times daily., Disp: 1 Inhaler, Rfl: 12 .  ibuprofen (ADVIL,MOTRIN) 600 MG tablet, Take 600 mg by mouth every 6 (six) hours as needed., Disp: , Rfl:  .  MAGNESIUM CITRATE PO, Take 1 tablet by mouth 2 (two) times daily., Disp: , Rfl:  .  Menaquinone-7 (VITAMIN K2) 100 MCG CAPS, Take by mouth., Disp: , Rfl:  .  methocarbamol (ROBAXIN) 500 MG tablet, Take 1 tablet (500 mg total) by mouth every 6 (six) hours as needed for muscle spasms., Disp: 90 tablet, Rfl: 0 .  omeprazole (PRILOSEC) 20 MG capsule, Take 20 mg by mouth daily. , Disp: , Rfl:  .  OVER THE COUNTER MEDICATION, Apply 1 application topically daily. Cetaphil Eczema Calming Body Wash, Disp: , Rfl:  .  OVER THE COUNTER MEDICATION, Apply 1 application topically daily. Cetaphil Eczema Calming Body Moisturizer, Disp: , Rfl:  .  valsartan (DIOVAN) 160 MG tablet, Take 160 mg by mouth daily., Disp: , Rfl: 11      Review of Systems     Objective:   Physical Exam  Constitutional: She is oriented to person, place, and time. She appears well-developed and well-nourished. No distress.  HENT:  Head:  Normocephalic and atraumatic.  Right Ear: External ear normal.  Left Ear: External ear normal.  Mouth/Throat: Oropharynx is clear and moist. No oropharyngeal exudate.  Mild post nasal drainage  Eyes: Conjunctivae and  EOM are normal. Pupils are equal, round, and reactive to light. Right eye exhibits no discharge. Left eye exhibits no discharge. No scleral icterus.  Neck: Normal range of motion. Neck supple. No JVD present. No tracheal deviation present. No thyromegaly present.  Cardiovascular: Normal rate, regular rhythm, normal heart sounds and intact distal pulses.  Exam reveals no gallop and no friction rub.   No murmur heard. Pulmonary/Chest: Effort normal and breath sounds normal. No respiratory distress. She has no wheezes. She has no rales. She exhibits no tenderness.  Abdominal: Soft. Bowel sounds are normal. She exhibits no distension and no mass. There is no tenderness. There is no rebound and no guarding.  Musculoskeletal: Normal range of motion. She exhibits no edema or tenderness.  Lymphadenopathy:    She has no cervical adenopathy.  Neurological: She is alert and oriented to person, place, and time. She has normal reflexes. No cranial nerve deficit. She exhibits normal muscle tone. Coordination normal.  Skin: Skin is warm and dry. No rash noted. She is not diaphoretic. No erythema. No pallor.  Psychiatric: She has a normal mood and affect. Her behavior is normal. Judgment and thought content normal.  Vitals reviewed.   Vitals:   03/30/16 1057  BP: 118/64  Pulse: 62  SpO2: 98%  Weight: 149 lb (67.6 kg)  Height: 5\' 4"  (1.626 m)        Assessment:       ICD-9-CM ICD-10-CM   1. Mild persistent asthma, uncomplicated 123456 [id number]   2. Post-nasal drainage 473.9 R09.82        Plan:      Mild increase in symptom level due to cold weather and post nasal drainage  Plan We discussed several different step-up strategies. And resolved - continue daily flovent; - start  nasal steroid daily - that you have at home - use albuterol before morning walks - if worsening or not better call us to step up inhaler therapy v adding singulair v short course prednisone  Followup  25months or sooner  Sarah. Brand Males, M.D., [facility].C.P Pulmonary and Critical Care Medicine Staff Physician [place] [facility] Pager: [phone], If no answer or between  15:00h - 7:00h: call [phone]  03/30/2016 11:29 AM

## 2016-03-30 NOTE — Progress Notes (Signed)
pft  

## 2016-06-05 NOTE — Progress Notes (Signed)
71 y.o. G1P1001 DivorcedCaucasianF here for annual exam.  Doing well.  Had right hip replacement 5/17.  Dr. Maureen Ralphs did her surgery.    Denies vaginal bleeding.    PCP:  Dr. Jackelyn Poling.  Has appt in the spring.  Patient's last menstrual period was 04/25/2003.          Sexually active: No.  The current method of family planning is abstinence.    Exercising: Yes.    personal trainer workouts, weights, stretches, core strength Smoker:  no  Health Maintenance: Pap:  03/04/14 negative, HR HPV negative, 02/21/13 negative, HR HPV negative  History of abnormal Pap:  yes MMG: 03/15/15 [place]- BIRADS 1 negative, pt states she had MMG on 12/23/15 at [place]   Colonoscopy:  2017 with Dr. Oletta Lamas at [facility]- repeat 5 years- does stool cards with PCP BMD:   12/08/13 osteopenia, pt states she had 12/23/15 at [place]:  2011  Pneumonia vaccine(s):  04/24/12  Zostavax:   2013  Hep C testing: will defer to PCP Screening Labs: PCP, Hb today: PCP, Urine today: PCP   reports that she has never smoked. She has never used smokeless tobacco. She reports that she drinks alcohol. She reports that she does not use drugs.  Past Medical History:  Diagnosis Date  . Arthritis   . Asthma   . Basal cell carcinoma 1994   right side of node  . Benign positional vertigo    with rolling over   . Breast cancer ([place]) 1995 & 2001   left-left mastectomy, left chest wall recurrence, radiation x 7 weeks  . Complication of anesthesia    slow waking up  . Dysrhythmia   . Family history of adverse reaction to anesthesia    pts mother had postop vomiting   . GERD (gastroesophageal reflux disease)   . Graves disease 2007   thyroid nodule  . Hard of hearing   . Heart murmur   . History of bronchitis    last episode 05/2015  . History of radiation therapy   . Hypertension   . Nocturia   . Numbness and tingling    bilat more per right hand  . Ovarian mass    small solid nodule 8x52m: Right ovary - stable since 2000  .  PONV (postoperative nausea and vomiting)    pt states occurred with pain meds questions darvocet   . Seasonal allergies   . Skin cancer    basal cell- nose  . Vertigo   . Wears glasses     Past Surgical History:  Procedure Laterality Date  . APPENDECTOMY    . basal cell removal  7/94   right nose  . BREAST BIOPSY Right 1987   benign fibroadenoma  . breast cancer recurrence Left 01/2000   at site of left chest wall after previous mastectomy with removal and treatment with radiaton.  .Marland KitchenCARPAL TUNNEL RELEASE Right 05/08/2013   Procedure: RIGHT CARPAL TUNNEL RELEASE;  Surgeon: RCammie Sickle, MD;  Location: [facility]  Service: Orthopedics;  Laterality: Right;  . CATARACT EXTRACTION     03/20/14 left eye, 12/15 right eye  . COLPOSCOPY W/ BIOPSY / CURETTAGE  03/12/12   benign, but + HR HPV  . EYE SURGERY     bilat cataract surgery   . MASTECTOMY Left 3/95   Stage IIb 0/14 LN ER/ PR + with mastectomy - Dr. LMendel Ryder . MOHS SURGERY     right nare  . OVARY  SURGERY  1974   lt  . TONSILLECTOMY    . TONSILLECTOMY AND ADENOIDECTOMY  1950  . TOTAL HIP ARTHROPLASTY Right 09/08/2015   Procedure: TOTAL HIP ARTHROPLASTY ANTERIOR APPROACH;  Surgeon: Gaynelle Arabian, MD;  Location: [facility];  Service: Orthopedics;  Laterality: Right;    Current Outpatient Prescriptions  Medication Sig Dispense Refill  . acetaminophen (TYLENOL) 650 MG CR tablet Take 1,300 mg by mouth as needed.     Marland Kitchen albuterol (PROVENTIL HFA;VENTOLIN HFA) 108 (90 Base) MCG/ACT inhaler Inhale 2 puffs into the lungs every 4 (four) hours as needed for wheezing. 1 Inhaler 3  . amLODipine (NORVASC) 5 MG tablet Take 5 mg by mouth daily. for blood pressure  5  . anastrozole (ARIMIDEX) 1 MG tablet Take 1 tablet by mouth  daily 90 tablet 3  . Artificial Tear Solution (TEARS NATURALE OP) Place 2 drops into both eyes daily as needed (For dry eyes.).    Marland Kitchen Cetirizine HCl (ZYRTEC ALLERGY PO) Take by mouth as needed.     .  Cholecalciferol (VITAMIN D) 2000 units CAPS Take by mouth.    . fluticasone (FLOVENT HFA) 110 MCG/ACT inhaler Inhale 2 puffs into the lungs 2 (two) times daily. 1 Inhaler 12  . ibuprofen (ADVIL,MOTRIN) 600 MG tablet Take 600 mg by mouth every 6 (six) hours as needed.    Marland Kitchen MAGNESIUM CITRATE PO Take 1 tablet by mouth 2 (two) times daily.    . Menaquinone-7 (VITAMIN K2) 100 MCG CAPS Take by mouth.    . methocarbamol (ROBAXIN) 500 MG tablet Take 1 tablet (500 mg total) by mouth every 6 (six) hours as needed for muscle spasms. 90 tablet 0  . omeprazole (PRILOSEC) 20 MG capsule Take 20 mg by mouth daily.     Marland Kitchen OVER THE COUNTER MEDICATION Apply 1 application topically daily. Cetaphil Eczema Calming Body Wash    . OVER THE COUNTER MEDICATION Apply 1 application topically daily. Cetaphil Eczema Calming Body Moisturizer    . valsartan (DIOVAN) 160 MG tablet Take 160 mg by mouth daily.  11   No current facility-administered medications for this visit.     Family History  Problem Relation Age of Onset  . Breast cancer Mother 82    deceased 68  . Osteoporosis Mother   . Hypertension Mother   . Hypertension Father   . Heart attack Father   . Breast cancer Sister 78    bilateral; currently 14  . Diabetes Brother   . Lung cancer Maternal Grandfather     deceased 37  . Cancer Paternal Grandmother     duodenal; deceased 37    ROS:  Pertinent items are noted in HPI.  Otherwise, a comprehensive ROS was negative.  Exam:   BP 132/62 (BP Location: Right Arm, Patient Position: Sitting, Cuff Size: Normal)   Pulse 66   Resp 12   Ht _0  (1.6 m)   Wt 148 lb (67.1 kg)   LMP 04/25/2003 Comment: spotting  BMI 26.22 kg/m   Weight change: -21#   Height: _1  (160 cm)  Ht Readings from Last 3 Encounters:  06/06/16 _2  (1.6 m)  03/30/16 _3  (1.626 m)  09/29/15 _4  (1.626 m)   General appearance: alert, cooperative and appears stated age Head: Normocephalic, without obvious abnormality,  atraumatic Neck: no adenopathy, supple, symmetrical, trachea midline and thyroid normal to inspection and palpation Lungs: clear to auscultation bilaterally Breasts: normal appearance, no masses or tenderness, (right breast), left breast s/p  mastectomy with radidation changes to skin, no masses and no LAD Heart: regular rate and rhythm Abdomen: soft, non-tender; bowel sounds normal; no masses,  no organomegaly Extremities: extremities normal, atraumatic, no cyanosis or edema Skin: Skin color, texture, turgor normal. No rashes or lesions Lymph nodes: Cervical, supraclavicular, and axillary nodes normal. No abnormal inguinal nodes palpated Neurologic: Grossly normal   Pelvic: External genitalia:  no lesions              Urethra:  normal appearing urethra with no masses, tenderness or lesions              Bartholins and Skenes: normal                 Vagina: normal appearing vagina with normal color and discharge, no lesions, significant atrophic changes              Cervix: no lesions              Pap taken: Yes.   Bimanual Exam:  Uterus:  normal size, contour, position, consistency, mobility, non-tender              Adnexa: normal adnexa and no mass, fullness, tenderness               Rectovaginal: Confirms               Anus:  normal sphincter tone, no lesions  Chaperone was present for exam.  A:  Well Woman with normal exam PMP, no HRT S/p left breast cancer 3.95 and 1/02.  Mastectomy initially.  Radiation and Arimidex after recurrence.  (BRCA negative)   H/O abnormal pap smear with +16 HR HPV.  Neg pap and neg HR HPV 10/14 and 11/15. Atrophic vaginitis H/O solid adnexal mass Strong family hx of breast cancer Osteopenia  P:        Mammogram UTD.  Will get copy of this and BMD. Release for colonoscopy will be signed Pap obtained today Ca-125 obtained PUS will be ordered Lab work will be done with Dr. Jackelyn Poling Return annually or prn

## 2016-06-06 ENCOUNTER — Ambulatory Visit (INDEPENDENT_AMBULATORY_CARE_PROVIDER_SITE_OTHER): Payer: Medicare Other | Admitting: Obstetrics & Gynecology

## 2016-06-06 ENCOUNTER — Encounter: Payer: Self-pay | Admitting: Obstetrics & Gynecology

## 2016-06-06 VITALS — BP 132/62 | HR 66 | Resp 12 | Ht 63.0 in | Wt 148.0 lb

## 2016-06-06 DIAGNOSIS — Z124 Encounter for screening for malignant neoplasm of cervix: Secondary | ICD-10-CM | POA: Diagnosis not present

## 2016-06-06 DIAGNOSIS — N839 Noninflammatory disorder of ovary, fallopian tube and broad ligament, unspecified: Secondary | ICD-10-CM | POA: Diagnosis not present

## 2016-06-06 DIAGNOSIS — Z01419 Encounter for gynecological examination (general) (routine) without abnormal findings: Secondary | ICD-10-CM

## 2016-06-06 DIAGNOSIS — N838 Other noninflammatory disorders of ovary, fallopian tube and broad ligament: Secondary | ICD-10-CM

## 2016-06-07 LAB — CA 125: CA 125: 5 U/mL (ref <35)

## 2016-06-08 ENCOUNTER — Telehealth: Payer: Self-pay | Admitting: *Deleted

## 2016-06-08 LAB — IPS PAP SMEAR ONLY

## 2016-06-08 NOTE — Telephone Encounter (Signed)
-----   Message from Megan Salon, MD sent at 06/06/2016  4:08 PM EST ----- Regarding: information about ultrasound Can you please call Mrs. Dama and have her use vaseline every day for a week on the two keratoses on her abdomen . This should soften them and won't cause any issues with the ultrasound.  Also, she needs to come with a full bladder for the ultrasound.  So, urinate very well about an hour before she comes and drink 16 oz water slowly over 30 minutes before the exam.  Thanks.  MSM

## 2016-06-08 NOTE — Telephone Encounter (Signed)
Call to patient. Message given to patient as seen below from Dr. Miller. Patient verbalized understanding.   Routing to provider for final review. Patient agreeable to disposition. Will close encounter.      

## 2016-06-12 ENCOUNTER — Telehealth: Payer: Self-pay | Admitting: Obstetrics & Gynecology

## 2016-06-12 NOTE — Telephone Encounter (Signed)
Called patient to review benefits for transvaginal ultrasound scheduled 06-27-16. Left benefit details as well as appointment date and time and cancellation policy per most recent DPR. Left voicemail to call back and review if she wishes.   Ok to close.

## 2016-06-27 ENCOUNTER — Ambulatory Visit (INDEPENDENT_AMBULATORY_CARE_PROVIDER_SITE_OTHER): Payer: Medicare Other

## 2016-06-27 ENCOUNTER — Other Ambulatory Visit: Payer: Self-pay | Admitting: Obstetrics & Gynecology

## 2016-06-27 ENCOUNTER — Ambulatory Visit (INDEPENDENT_AMBULATORY_CARE_PROVIDER_SITE_OTHER): Payer: Medicare Other | Admitting: Obstetrics & Gynecology

## 2016-06-27 VITALS — BP 140/64 | HR 60 | Resp 14 | Ht 63.0 in | Wt 152.0 lb

## 2016-06-27 DIAGNOSIS — N839 Noninflammatory disorder of ovary, fallopian tube and broad ligament, unspecified: Secondary | ICD-10-CM

## 2016-06-27 DIAGNOSIS — N838 Other noninflammatory disorders of ovary, fallopian tube and broad ligament: Secondary | ICD-10-CM

## 2016-06-27 NOTE — Progress Notes (Signed)
71 y.o. G12P1001 Divorced Caucasian female here for pelvic ultrasound due to history of small solid mass on ovary.  Pt cannot tolerate vaginal exams with vaginal prob so abdominal exam is planned today.  Pt with hx of breast cancer as well as in sister and mother.  She did have genetic testing done and was BRCA negative.  Had PUS 2/16 showing possible solid area on ovary measuring 2.5cm. This was not a new finding.  Ca-125 levels have been normal.  She does have hx of laparotomy with excision of part or all of "an ovary" but pt has limited information to porv  Patient's last menstrual period was 04/25/2003.  Contraception: PMP  Findings:  UTERUS: 5.2 x 3.8 x 1.4cm EMS: difficult to evaluated ADNEXA: Left ovary: 2.2 x 1.0 x 1.0cm       Right ovary: not seen CUL DE SAC: no free fluid  Discussion:  Pt clearly aware this is a limited exam due to transabdominal approach and poor quality of ultrasound.  As long as nothing significant was seen, she is satisfied.  Again, reiterated the importance of understanding that this was not a good quality ultrasound however nothing "singificant" was noted.  She does not want to proceed with additional ultrasounds unless she has a new symptoms or problem.  She is aware of the limitation with ca-125 but her most recent on 06/06/16 was 5.  All questions answered.  Assessment:  H/o solid 2.5cm right adnexal mass, not seen today but study limited by transabdominal approach.    Plan:  I do think she understands the limitations of this imaging today.  Declines additional testing such as CT at this time.  ~15 minutes spent with patient >50% of time was in face to face discussion of above.

## 2016-06-30 ENCOUNTER — Encounter: Payer: Self-pay | Admitting: Obstetrics & Gynecology

## 2016-07-11 ENCOUNTER — Other Ambulatory Visit: Payer: Self-pay | Admitting: Internal Medicine

## 2016-08-31 ENCOUNTER — Ambulatory Visit (HOSPITAL_BASED_OUTPATIENT_CLINIC_OR_DEPARTMENT_OTHER): Payer: Medicare Other | Admitting: Oncology

## 2016-08-31 VITALS — BP 139/58 | HR 65 | Temp 98.4°F | Resp 18 | Ht 63.0 in | Wt 148.6 lb

## 2016-08-31 DIAGNOSIS — C44501 Unspecified malignant neoplasm of skin of breast: Secondary | ICD-10-CM

## 2016-08-31 DIAGNOSIS — C50912 Malignant neoplasm of unspecified site of left female breast: Secondary | ICD-10-CM

## 2016-08-31 DIAGNOSIS — Z170 Estrogen receptor positive status [ER+]: Secondary | ICD-10-CM

## 2016-08-31 NOTE — Progress Notes (Signed)
  [facility] OFFICE PROGRESS NOTE   Diagnosis: Breast cancer  INTERVAL HISTORY:   Ms. Rengel returns as scheduled. She continues Arimidex. She has noted intermittent itching at the left chest wall telangiectasias. No palpable change at the left chest wall or right breast. She had an episode left lower arm swelling after wearing a tight exercise watchful. This resolved after she saw an acupuncturist. She feels well. The right hip discomfort resolved after the hip replacement surgery last year. She walks 5 miles per day.  Objective:  Vital signs in last 24 hours:  Blood pressure (!) 139/58, pulse 65, temperature 98.4 F (36.9 C), temperature source Oral, resp. rate 18, height 5\' 3"  (1.6 m), weight 148 lb 9.6 oz (67.4 kg), last menstrual period 04/25/2003, SpO2 100 %.    HEENT: Neck without mass Lymphatics: No cervical, supraclavicular, or axillary nodes Resp: Lungs clear bilaterally Cardio: Regular rate and rhythm GI: No hepatosplenomegaly, nontender Vascular: No leg edema Breasts: Right breast without mass , status post left mastectomy. No evidence for chest wall tumor recurrence. Radiation telangiectasias over the left anterior chest wall and in the left axilla.    Medications: I have reviewed the patient's current medications.  Assessment/Plan:  Ms. Hoganson has a history of left-sided breast cancer dating to 89. She developed a chest wall recurrence in October 2001. She remains in clinical remission. She will continue indefinite Arimidex.     Disposition:  There is no clinical evidence of progressive breast cancer. Ms. Regas will continue Arimidex. She will return for an office visit in one year. She will be scheduled for a mammogram in September 2018. We will follow-up on the bone density scan and mammogram from last year. She will contact us if she develops recurrent swelling of the left arm.  15 minutes were spent with the patient today. The  majority of the time was used for counseling and coordination of care.  Betsy Coder, MD  08/31/2016  11:56 AM

## 2016-09-01 ENCOUNTER — Telehealth: Payer: Self-pay | Admitting: Oncology

## 2016-09-01 NOTE — Telephone Encounter (Signed)
Appt scheduled per 5/10 LOS - patient left before stopping by scheduling . F/u in one year, appt lettered in mail.

## 2016-09-26 ENCOUNTER — Encounter: Payer: Self-pay | Admitting: Internal Medicine

## 2016-09-26 ENCOUNTER — Ambulatory Visit (INDEPENDENT_AMBULATORY_CARE_PROVIDER_SITE_OTHER): Payer: Medicare Other | Admitting: Internal Medicine

## 2016-09-26 ENCOUNTER — Other Ambulatory Visit: Payer: Medicare Other

## 2016-09-26 ENCOUNTER — Telehealth: Payer: Self-pay | Admitting: Internal Medicine

## 2016-09-26 DIAGNOSIS — R600 Localized edema: Secondary | ICD-10-CM | POA: Diagnosis not present

## 2016-09-26 DIAGNOSIS — J4530 Mild persistent asthma, uncomplicated: Secondary | ICD-10-CM

## 2016-09-26 DIAGNOSIS — R7989 Other specified abnormal findings of blood chemistry: Secondary | ICD-10-CM

## 2016-09-26 LAB — D-DIMER, QUANTITATIVE (NOT AT ARMC): D DIMER QUANT: 0.79 ug{FEU}/mL — AB (ref <0.50)

## 2016-09-26 NOTE — Patient Instructions (Signed)
Mild persistent asthma, uncomplicated Well controlled  Plan Continue flovent as before scheduled Use albuterol as needed Flu shot in fall  Followup 9 mnths or sooner if needed  Leg edema, left Might be due to varicose veins but need to rule out dvt due to travel and warmth on palpation  Plan D-dimer blood test 09/26/2016  - if positive need duplex Lower

## 2016-09-26 NOTE — Telephone Encounter (Signed)
Pt returned call. Informed her of the results and recs per MR. Order placed and Samples left up front (per MR since there are not 10mg  Eliquis samples, ok to use 2.5mg  tabs but to add up to 5mg  BID). Informed pt of the medication and them being left up front. Pt verbalized understanding and denied any further questions or concerns at this time.

## 2016-09-26 NOTE — Telephone Encounter (Signed)
Pt returning call to get results of test.Sarah Underwood

## 2016-09-26 NOTE — Assessment & Plan Note (Signed)
Might be due to varicose veins but need to rule out dvt due to travel and warmth on palpation  Plan D-dimer blood test 09/26/2016  - if positive need duplex Lower

## 2016-09-26 NOTE — Telephone Encounter (Signed)
D-dimer 0.79 - needs bilateral duplex LE 09/26/2016 or 09/27/16 . Any worsening shortness of breath or leg swelling should go to ER immediately. BE careful not to squeeze legs.    While waiting for duplex to be done sh can come to office nad pick up Eliquis sample - 10 mg twice daily for 7 days followed by 5 mg twice daily to continue as long as he does not have bleeding or bleeding risk  WE wil stop eliquis if no DVT  Dr. Brand Males, M.D., [facility].C.P Pulmonary and Critical Care Medicine Staff Physician [facility] Pager: [phone], If no answer or between  15:00h - 7:00h: call [phone]  09/26/2016 2:12 PM        Current Outpatient Prescriptions:  .  acetaminophen (TYLENOL) 650 MG CR tablet, Take 1,300 mg by mouth as needed. , Disp: , Rfl:  .  albuterol (PROVENTIL HFA;VENTOLIN HFA) 108 (90 Base) MCG/ACT inhaler, Inhale 2 puffs into the lungs every 4 (four) hours as needed for wheezing., Disp: 1 Inhaler, Rfl: 3 .  amLODipine (NORVASC) 5 MG tablet, Take 5 mg by mouth daily. for blood pressure, Disp: , Rfl: 5 .  anastrozole (ARIMIDEX) 1 MG tablet, Take 1 tablet by mouth  daily, Disp: 90 tablet, Rfl: 3 .  Artificial Tear Solution (TEARS NATURALE OP), Place 2 drops into both eyes daily as needed (For dry eyes.)., Disp: , Rfl:  .  calcium-vitamin D 250-100 MG-UNIT tablet, Take 1 tablet by mouth 2 (two) times daily., Disp: , Rfl:  .  Cetirizine HCl (ZYRTEC ALLERGY PO), Take by mouth as needed. , Disp: , Rfl:  .  Cholecalciferol (VITAMIN D) 2000 units CAPS, Take by mouth., Disp: , Rfl:  .  FLOVENT HFA 110 MCG/ACT inhaler, INHALE 2 PUFFS INTO THE LUNGS 2 (TWO) TIMES DAILY., Disp: 12 g, Rfl: 5 .  ibuprofen (ADVIL,MOTRIN) 600 MG tablet, Take 600 mg by mouth every 6 (six) hours as needed., Disp: , Rfl:  .  MAGNESIUM CITRATE PO, Take 1 tablet by mouth 2 (two) times daily., Disp: , Rfl:  .  Menaquinone-7 (VITAMIN K2) 100 MCG CAPS, Take by  mouth., Disp: , Rfl:  .  methocarbamol (ROBAXIN) 500 MG tablet, Take 1 tablet (500 mg total) by mouth every 6 (six) hours as needed for muscle spasms., Disp: 90 tablet, Rfl: 0 .  Multiple Vitamin (MULTIVITAMIN) tablet, Take 1 tablet by mouth daily., Disp: , Rfl:  .  omeprazole (PRILOSEC) 20 MG capsule, Take 20 mg by mouth daily. , Disp: , Rfl:  .  OVER THE COUNTER MEDICATION, Apply 1 application topically daily. Cetaphil Eczema Calming Body Wash, Disp: , Rfl:  .  OVER THE COUNTER MEDICATION, Apply 1 application topically daily. Cetaphil Eczema Calming Body Moisturizer, Disp: , Rfl:  .  valsartan (DIOVAN) 160 MG tablet, Take 160 mg by mouth daily., Disp: , Rfl: 11

## 2016-09-26 NOTE — Progress Notes (Signed)
Subjective:     Patient ID: Sarah Underwood, female   DOB: 04/18/1946, 71 y.o.   MRN: [id number]  HPI    PCP Velna Hatchet, MD Referred by Dr Everlene Farrier Ortho: Dr Maureen Ralphs  HPI    IOV 06/29/2015  Chief Complaint  Patient presents with  . Pulmonary Consult    Pt referred by Dr. Everlene Farrier for wheezing and acute bronchitis. Pt states she was told by her PCP that she has COPD. Pt states every couple years she gets bronchitis. Pt c/o mild DOE, prod cough with clear and white mucus, wheezing x 2 weeks. Pt denies CP/tightness. Pt states she has just finished doxy and pred taper.    71 year old female referred for recurrent episodes of wheezing bronchitis.  Reports that in 2011 had episode of viral infection followed by severe acute bronchitis with wheezing that lasted several weeks. This again happened in 2013. Then in 2015 had atrial fibrillation was seen by Dr. Einar Gip and apparently was told that she might have COPD based on chest x-ray hyperinflation according to her report. Most recently admitted February 2017 she had a steroid injection into her right hip because of DJD [she's been using a cane for the last few months and is anticipated to have hip replacement surgery]. Then she took a flight to [facility]. Couple of days after getting that got acutely ill with respiratory infection him a cough, chest tightness, wheezing. Upon her return she's been treated with 10 days of doxycycline and prednisone taper which is ending today. And she is significantly better. She reports that in between episodes she occasionally has wheezing particularly on exposure to cold air or with exertion.   Chest x-ray 06/20/2015: Lung fields are clear as personally visualized and per report. Agree with surgical clips on the left side  Exhaled nitric oxide today is 15 ppb but is on the last of 10 days of prednisone and normal  Blood work hemoglobin 12.6 g 06/20/2015.   reports that she has never smoked. She has never  used smokeless tobacco.    OV 09/29/2015  Chief Complaint  Patient presents with  . Follow-up    pt doing well, last day of PT post hip replacement is tomorrow.  Pt notes R sided sinus congestion,PND.     Fu Mild persistent asthma - clinical dx  This is a follow-up to see how she's doing with inhaled corticosteroids. In the interim she's had a hip surgery. She says that Flovent has helped her significantly. She still has occasional wheezing on exertion outside the house or cold air exposure or allergen exposure. Otherwise feels significantly improved. In the last week or so she does not wake up in the middle of the night of the asthma symptoms when she wakes up she has very mild symptoms. She has very slight limitation with her activities because of asthma and she experiences a little short of breath and wheezing because of asthma and she's use albuterol for rescue just once. The 5 point asthma control question of 0.8 suggesting good control. Of note she's never had pulmonary function testing  OV 03/30/2016  Chief Complaint  Patient presents with  . Follow-up    Pt. had her PFT today, Pt. states her breathing has been doing well, but has noticed the cold weather has affected her breathing with her getting sob more quicky, Pt. has some sinus congestion     Follow-up mild persistent asthma-clinical diagnosis  She is here for routine follow-up. She's been on  Flovent for over a year according to history. She says it has made a significant difference in her respiratory health. She no longer has significant chest tightness cough or wheezing as much as she used to. Albuterol use is been 0. However in the last few weeks because of the change in weather and during the morning walks she notices increased chest tightness and dry cough. She feels albuterol will help her but she has not resorted to using it because the symptoms are transient. When she wakes up in the morning she does have symptoms. There  is significantly worse and a moderate level currently than her baseline. She attributes this to cold weather. She is also having postnasal drainage.  On function test today is normal other than slightly reduced DLCO. She prefers simple line of step up therapy management for this worsening symptoms . Asthma control questionnaire today is 1.4 and worse than baseline     Results for SHIORI, ADCOX (MRN [id number]) as of 03/30/2016 11:15  Ref. Range 03/30/2016 09:48  FVC-Pre Latest Units: L 2.96  FVC-%Pred-Pre Latest Units: % 97  FEV1-Pre Latest Units: L 2.52  FEV1-%Pred-Pre Latest Units: % 109  Pre FEV1/FVC ratio Latest Units: % 85  FEV1FVC-%Pred-Pre Latest Units: % 112   Results for KEMBA, HOPPES (MRN [id number]) as of 03/30/2016 11:15  Ref. Range 03/30/2016 09:48  FVC-Post Latest Units: L 2.93  FVC-%Pred-Post Latest Units: % 96  FVC-%Change-Post Latest Units: % 0  FEV1-Post Latest Units: L 2.59  FEV1-%Pred-Post Latest Units: % 112  FEV1-%Change-Post Latest Units: % 2  Results for MALAYLA, GRANBERRY (MRN [id number]) as of 03/30/2016 11:15  Ref. Range 03/30/2016 09:48  DLCO cor Latest Units: ml/min/mmHg 18.12  DLCO cor % pred Latest Units: % 72  Results for LYNNOX, GIRTEN (MRN [id number]) as of 03/30/2016 11:15  Ref. Range 03/30/2016 09:48  TLC Latest Units: L 4.64  TLC % pred Latest Units: % 90    OV 09/26/2016  Chief Complaint  Patient presents with  . Follow-up    Pt c/o sinus congestion, PND, watery eyes - pt states this is from her seasonal allergies. Pt states her breathing is at baseline. Pt denies CP/tightness and f/c/s.     Follow-up mild persistent asthma basis of clinical diagnosis and therapeutic response to Flovent  Asthma: She continues to do well. She thinks Flovent worked wonders for her. This is been no exacerbations, no emergency room visits, no prednisone use. No albuterol rescue use. No nighttime symptoms.  New issue: She has spent a week at [place] in [place]. She developed a flight from [place] to [place] and back again in one week. Then this last night noticed a slight increase in left lower extremity edema. She has varicose veins on exam there is no hemoptysis or shortness of breath        has a past medical history of Arthritis; Asthma; Basal cell carcinoma (1994); Benign positional vertigo; Breast cancer ([place]) (1995 & 2001); Complication of anesthesia; Dysrhythmia; Family history of adverse reaction to anesthesia; GERD (gastroesophageal reflux disease); Graves disease (2007); Hard of hearing; Heart murmur; History of radiation therapy; Hypertension; Nocturia; Numbness and tingling; Ovarian mass; PONV (postoperative nausea and vomiting); Seasonal allergies; Vertigo; and Wears glasses.   reports that she has never smoked. She has never used smokeless tobacco.  Past Surgical History:  Procedure Laterality Date  . APPENDECTOMY    . basal cell removal  7/94   right nose  .  BREAST BIOPSY Right 1987   benign fibroadenoma  . breast cancer recurrence Left 01/2000   at site of left chest wall after previous mastectomy with removal and treatment with radiaton.  Marland Kitchen CARPAL TUNNEL RELEASE Right 05/08/2013   Procedure: RIGHT CARPAL TUNNEL RELEASE;  Surgeon: Cammie Sickle., MD;  Location: [facility];  Service: Orthopedics;  Laterality: Right;  . CATARACT EXTRACTION     03/20/14 left eye, 12/15 right eye  . COLPOSCOPY W/ BIOPSY / CURETTAGE  03/12/12   benign, but + HR HPV  . EYE SURGERY     bilat cataract surgery   . MASTECTOMY Left 3/95   Stage IIb 0/14 LN ER/ PR + with mastectomy - Dr. Mendel Ryder  . MOHS SURGERY     right nare  . [place]   lt  . TONSILLECTOMY AND ADENOIDECTOMY  1950  . TOTAL HIP ARTHROPLASTY Right 09/08/2015   Procedure: TOTAL HIP ARTHROPLASTY ANTERIOR APPROACH;  Surgeon: Gaynelle Arabian, MD;  Location: [facility];  Service: Orthopedics;  Laterality: Right;    Allergies   Allergen Reactions  . Erythromycin Nausea And Vomiting  . Penicillins Hives and Other (See Comments)    Has patient had a PCN reaction causing immediate rash, facial/tongue/throat swelling, SOB or lightheadedness with hypotension: no Has patient had a PCN reaction causing severe rash involving mucus membranes or skin necrosis: no Has patient had a PCN reaction that required hospitalization no Has patient had a PCN reaction occurring within the last 10 years: no If all of the above answers are "NO", then Underwood proceed with Cephalosporin use.   Marland Kitchen Hydrochlorothiazide Rash    Pt states caused a sunburn type reaction   . Sulfa Antibiotics Rash    Immunization History  Administered Date(s) Administered  . Influenza, High Dose Seasonal PF 02/14/2016  . Influenza,inj,Quad PF,36+ Mos 02/23/2015  . Pneumococcal Polysaccharide-23 04/24/2012    Family History  Problem Relation Age of Onset  . Breast cancer Mother 67       deceased 57  . Osteoporosis Mother   . Hypertension Mother   . Hypertension Father   . Heart attack Father   . Breast cancer Sister 43       bilateral; currently 101  . Diabetes Brother   . Lung cancer Maternal Grandfather        deceased 76  . Cancer Paternal Grandmother        duodenal; deceased 35     Current Outpatient Prescriptions:  .  acetaminophen (TYLENOL) 650 MG CR tablet, Take 1,300 mg by mouth as needed. , Disp: , Rfl:  .  albuterol (PROVENTIL HFA;VENTOLIN HFA) 108 (90 Base) MCG/ACT inhaler, Inhale 2 puffs into the lungs every 4 (four) hours as needed for wheezing., Disp: 1 Inhaler, Rfl: 3 .  amLODipine (NORVASC) 5 MG tablet, Take 5 mg by mouth daily. for blood pressure, Disp: , Rfl: 5 .  anastrozole (ARIMIDEX) 1 MG tablet, Take 1 tablet by mouth  daily, Disp: 90 tablet, Rfl: 3 .  Artificial Tear Solution (TEARS NATURALE OP), Place 2 drops into both eyes daily as needed (For dry eyes.)., Disp: , Rfl:  .  calcium-vitamin D 250-100 MG-UNIT tablet, Take 1  tablet by mouth 2 (two) times daily., Disp: , Rfl:  .  Cetirizine HCl (ZYRTEC ALLERGY PO), Take by mouth as needed. , Disp: , Rfl:  .  Cholecalciferol (VITAMIN D) 2000 units CAPS, Take by mouth., Disp: , Rfl:  .  FLOVENT HFA  110 MCG/ACT inhaler, INHALE 2 PUFFS INTO THE LUNGS 2 (TWO) TIMES DAILY., Disp: 12 g, Rfl: 5 .  ibuprofen (ADVIL,MOTRIN) 600 MG tablet, Take 600 mg by mouth every 6 (six) hours as needed., Disp: , Rfl:  .  MAGNESIUM CITRATE PO, Take 1 tablet by mouth 2 (two) times daily., Disp: , Rfl:  .  Menaquinone-7 (VITAMIN K2) 100 MCG CAPS, Take by mouth., Disp: , Rfl:  .  methocarbamol (ROBAXIN) 500 MG tablet, Take 1 tablet (500 mg total) by mouth every 6 (six) hours as needed for muscle spasms., Disp: 90 tablet, Rfl: 0 .  Multiple Vitamin (MULTIVITAMIN) tablet, Take 1 tablet by mouth daily., Disp: , Rfl:  .  omeprazole (PRILOSEC) 20 MG capsule, Take 20 mg by mouth daily. , Disp: , Rfl:  .  OVER THE COUNTER MEDICATION, Apply 1 application topically daily. Cetaphil Eczema Calming Body Wash, Disp: , Rfl:  .  OVER THE COUNTER MEDICATION, Apply 1 application topically daily. Cetaphil Eczema Calming Body Moisturizer, Disp: , Rfl:  .  valsartan (DIOVAN) 160 MG tablet, Take 160 mg by mouth daily., Disp: , Rfl: 11   Review of Systems     Objective:   Physical Exam  Constitutional: She is oriented to person, place, and time. She appears well-developed and well-nourished. No distress.  HENT:  Head: Normocephalic and atraumatic.  Right Ear: External ear normal.  Left Ear: External ear normal.  Mouth/Throat: Oropharynx is clear and moist. No oropharyngeal exudate.  Eyes: Conjunctivae and EOM are normal. Pupils are equal, round, and reactive to light. Right eye exhibits no discharge. Left eye exhibits no discharge. No scleral icterus.  Neck: Normal range of motion. Neck supple. No JVD present. No tracheal deviation present. No thyromegaly present.  Cardiovascular: Normal rate, regular rhythm,  normal heart sounds and intact distal pulses.  Exam reveals no gallop and no friction rub.   No murmur heard. Pulmonary/Chest: Effort normal and breath sounds normal. No respiratory distress. She has no wheezes. She has no rales. She exhibits no tenderness.  Abdominal: Soft. Bowel sounds are normal. She exhibits no distension and no mass. There is no tenderness. There is no rebound and no guarding.  Musculoskeletal: Normal range of motion. She exhibits no edema or tenderness.  Bilateral mild varicose veins present Left calf is warm to touch compared to the right calf Only very mild edema on both equally  Lymphadenopathy:    She has no cervical adenopathy.  Neurological: She is alert and oriented to person, place, and time. She has normal reflexes. No cranial nerve deficit. She exhibits normal muscle tone. Coordination normal.  Skin: Skin is warm and dry. No rash noted. She is not diaphoretic. No erythema. No pallor.  Psychiatric: She has a normal mood and affect. Her behavior is normal. Judgment and thought content normal.  Vitals reviewed.  Vitals:   09/26/16 1041  BP: 104/68  Pulse: 64  SpO2: 98%  Weight: 149 lb (67.6 kg)  Height: 5\' 3"  (1.6 m)   Estimated body mass index is 26.39 kg/m as calculated from the following:   Height as of this encounter: 5\' 3"  (1.6 m).   Weight as of this encounter: 149 lb (67.6 kg).     Assessment:       ICD-9-CM ICD-10-CM   1. Mild persistent asthma, uncomplicated 165.53 Z48.27 D-Dimer, Quantitative  2. Leg edema, left 782.3 R60.0 D-Dimer, Quantitative       Plan:     Mild persistent asthma, uncomplicated Well controlled  Plan Continue flovent as before scheduled Use albuterol as needed Flu shot in fall  Followup 9 mnths or sooner if needed  Leg edema, left Might be due to varicose veins but need to rule out dvt due to travel and warmth on palpation  Plan D-dimer blood test 09/26/2016  - if positive need duplex Lower      Dr.  Brand Males, M.D., [facility].C.P Pulmonary and Critical Care Medicine Staff Physician [facility] Pager: [phone], If no answer or between  15:00h - 7:00h: call [phone]  09/26/2016 11:11 AM

## 2016-09-26 NOTE — Assessment & Plan Note (Signed)
Well controlled  Plan Continue flovent as before scheduled Use albuterol as needed Flu shot in fall  Followup 9 mnths or sooner if needed

## 2016-09-26 NOTE — Telephone Encounter (Signed)
LMOM TCB x1 to discuss lab results/recs

## 2016-09-27 ENCOUNTER — Ambulatory Visit (HOSPITAL_COMMUNITY)
Admission: RE | Admit: 2016-09-27 | Discharge: 2016-09-27 | Disposition: A | Discharge status: Home / Self Care | Payer: Medicare Other | Source: Ambulatory Visit | Attending: Cardiology | Admitting: Cardiology

## 2016-09-27 ENCOUNTER — Encounter (HOSPITAL_COMMUNITY): Payer: Self-pay

## 2016-09-27 ENCOUNTER — Telehealth: Payer: Self-pay | Admitting: Internal Medicine

## 2016-09-27 DIAGNOSIS — R7989 Other specified abnormal findings of blood chemistry: Secondary | ICD-10-CM

## 2016-09-27 DIAGNOSIS — M7989 Other specified soft tissue disorders: Secondary | ICD-10-CM | POA: Diagnosis not present

## 2016-09-27 NOTE — Telephone Encounter (Signed)
Duplex LE - no dvt  Plan Stop eliquies Swelling likely due to varicose veins - she needs to talk to Patrick Jupiter, MD  Dr. Brand Males, M.D., [facility].C.P Pulmonary and Critical Care Medicine Staff Physician [facility] Pager: [phone], If no answer or between  15:00h - 7:00h: call [phone]  09/27/2016 11:36 PM

## 2016-09-27 NOTE — Progress Notes (Signed)
Today's left lower extremity venous duplex is negative for DVT. Preliminary results given to Dr. Chase Caller.

## 2016-09-28 NOTE — Telephone Encounter (Signed)
Pt returned phone call..ert ° ° °

## 2016-09-28 NOTE — Telephone Encounter (Signed)
lmtcb for pt.  

## 2016-09-28 NOTE — Telephone Encounter (Signed)
Spoke with pt, aware of results/recs.  Nothing further needed.  

## 2016-10-03 NOTE — Telephone Encounter (Signed)
Addendum: Samples for Eliquis 2.5mg  (3 boxes) were placed up front to pick up 09/26/16. These were given as preventative measures and per results 09/27/16, Dr Chase Caller stopped all Eliquis. These were never picked up.  Samples have been logged back in and put back on the shelf. Nothing further needed.

## 2016-10-29 ENCOUNTER — Other Ambulatory Visit: Payer: Self-pay | Admitting: Oncology

## 2016-12-14 ENCOUNTER — Ambulatory Visit (INDEPENDENT_AMBULATORY_CARE_PROVIDER_SITE_OTHER): Payer: Medicare Other

## 2016-12-14 ENCOUNTER — Ambulatory Visit (HOSPITAL_COMMUNITY)
Admission: EM | Admit: 2016-12-14 | Discharge: 2016-12-14 | Disposition: A | Discharge status: Home / Self Care | Payer: Medicare Other | Attending: Emergency Medicine | Admitting: Emergency Medicine

## 2016-12-14 ENCOUNTER — Encounter (HOSPITAL_COMMUNITY): Payer: Self-pay | Admitting: Emergency Medicine

## 2016-12-14 DIAGNOSIS — S93401A Sprain of unspecified ligament of right ankle, initial encounter: Secondary | ICD-10-CM | POA: Diagnosis not present

## 2016-12-14 DIAGNOSIS — M25571 Pain in right ankle and joints of right foot: Secondary | ICD-10-CM | POA: Diagnosis not present

## 2016-12-14 DIAGNOSIS — M79671 Pain in right foot: Secondary | ICD-10-CM | POA: Diagnosis not present

## 2016-12-14 IMAGING — DX DG ANKLE COMPLETE 3+V*R*
3 series · 3 of 3 positions shown · non-contrast
Comparison: [DATE].

CLINICAL DATA: Injury.

EXAM:
RIGHT ANKLE - COMPLETE 3+ VIEW

[ankle ap]
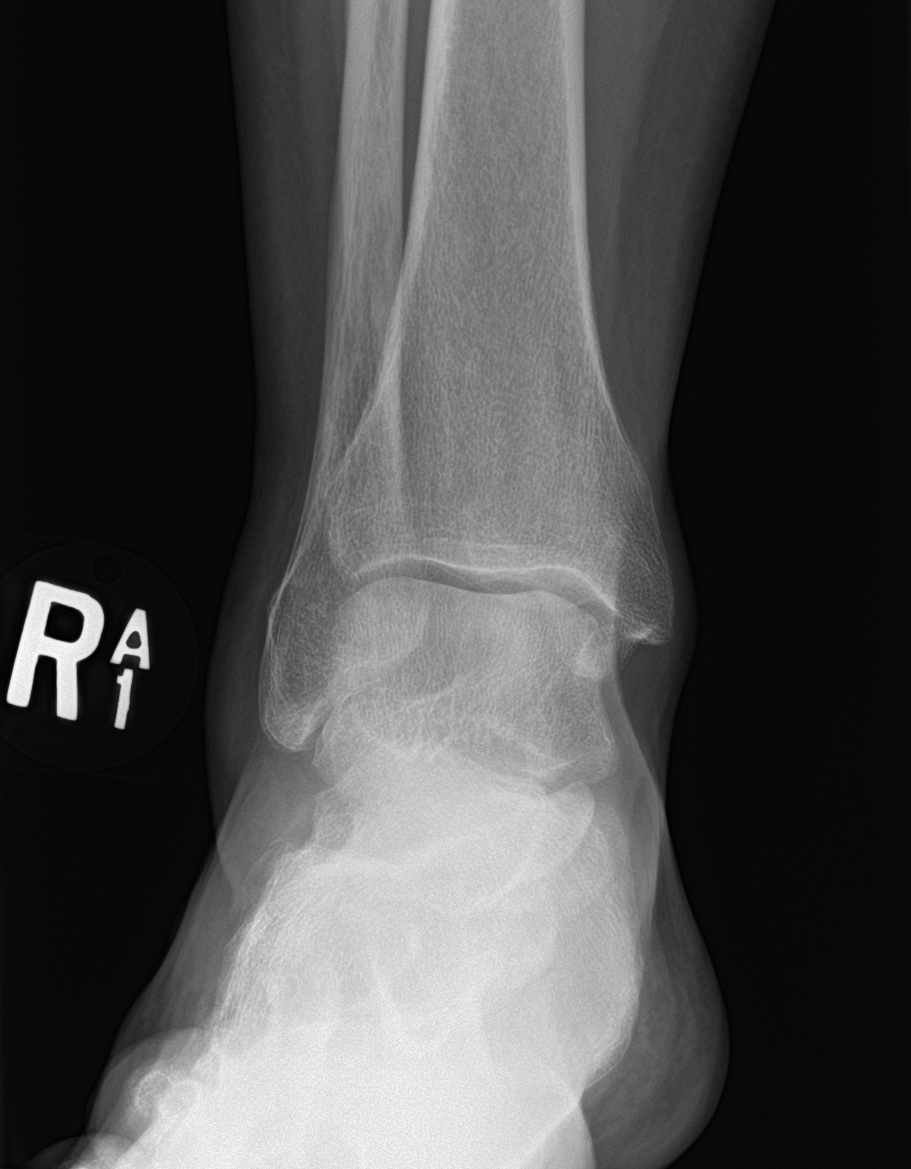

[ankle obl]
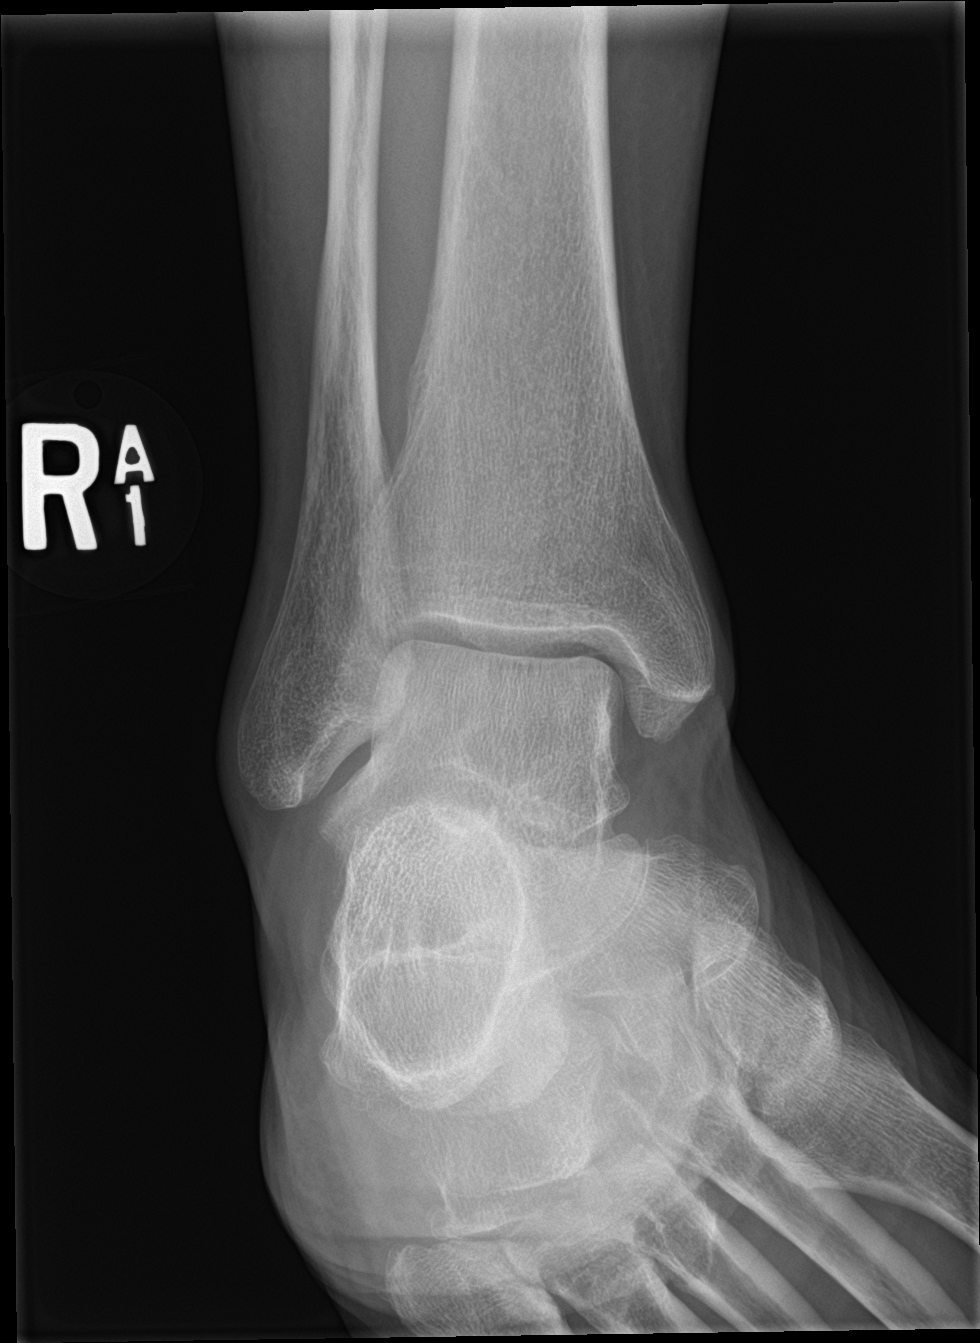

[ankle lat]
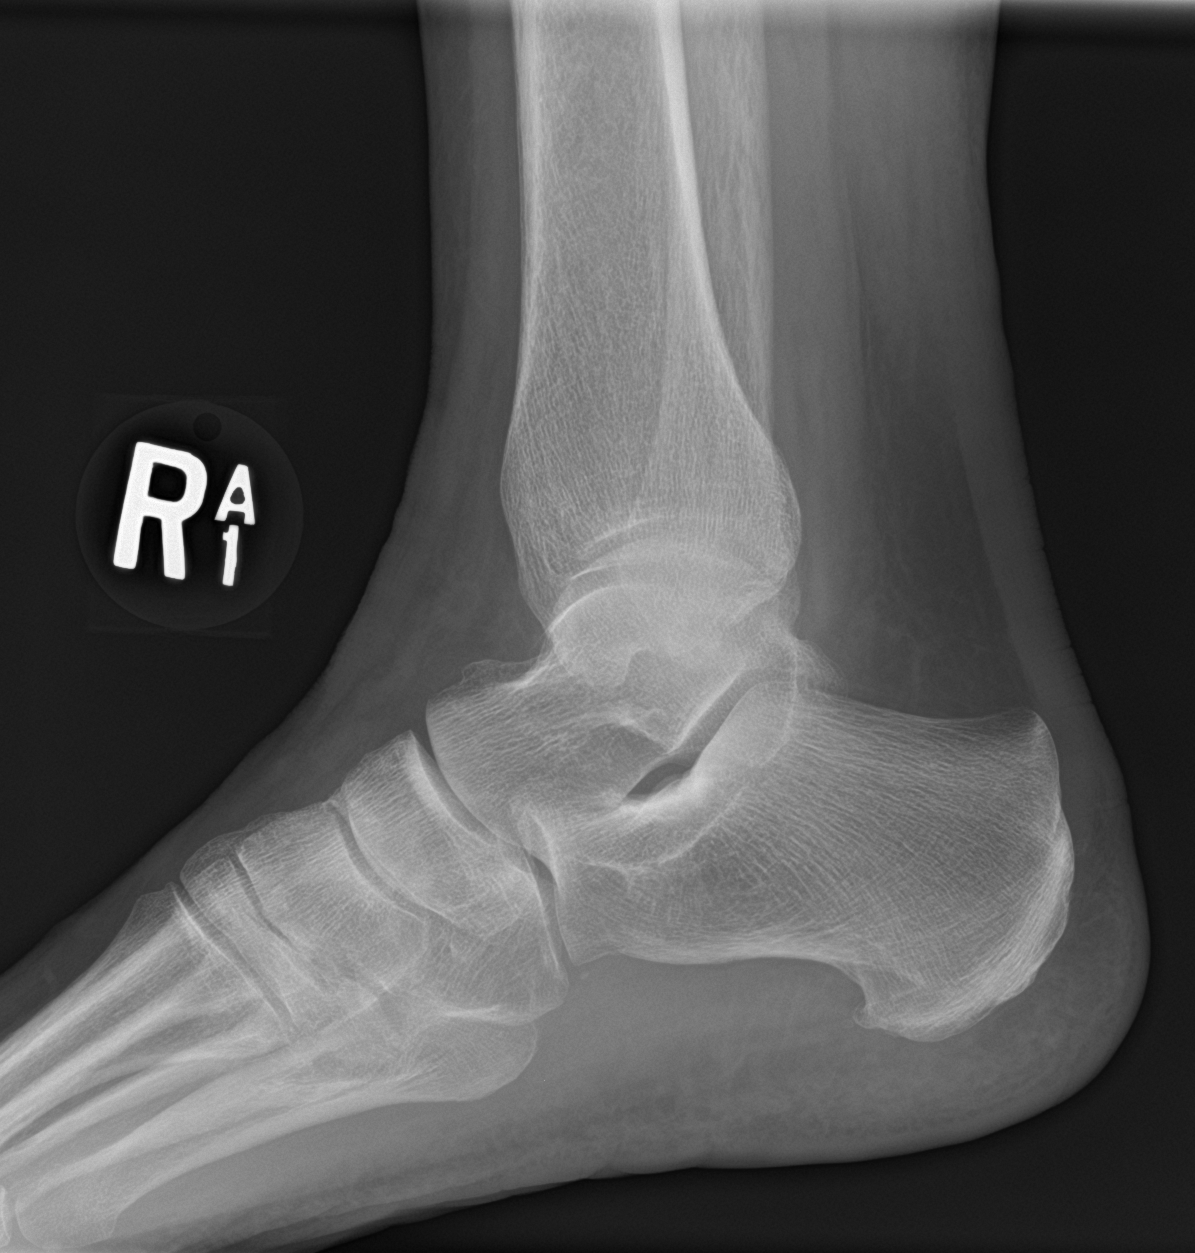

[3 of 3 positions shown; findings below may reference images not displayed]

FINDINGS: Mild soft tissue swelling. Tiny corticated bony density noted
adjacent to the anterior aspect of the calcaneus is stable most
likely tiny secondary ossification center No acute bony or joint
abnormality identified. No evidence of fracture dislocation.
IMPRESSION: Mild soft tissue swelling.  No acute bony or joint abnormality.

## 2016-12-14 IMAGING — DX DG FOOT COMPLETE 3+V*R*
3 series · 3 of 3 positions shown · non-contrast
Comparison: None.

CLINICAL DATA: Pain following rolling injury

EXAM:
RIGHT FOOT COMPLETE - 3+ VIEW

[foot ap]
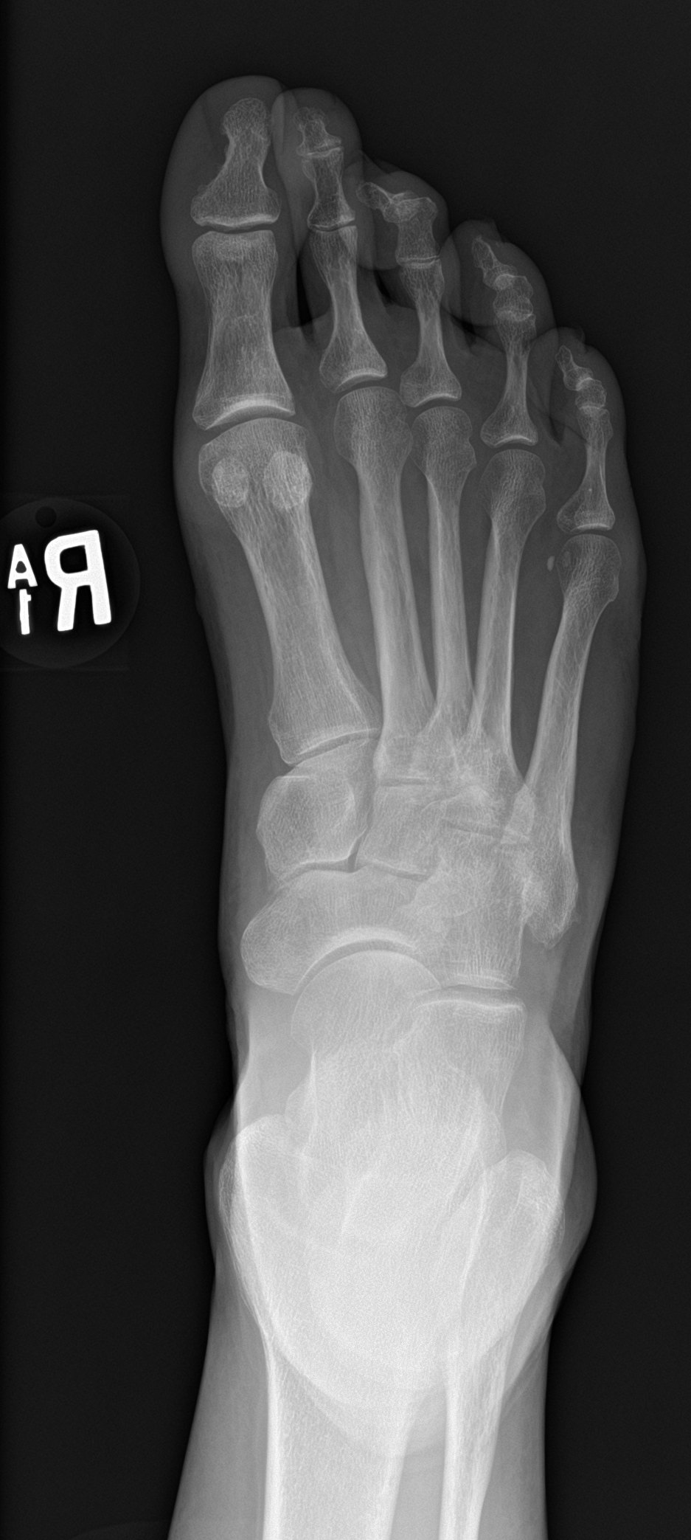

[foot obl]
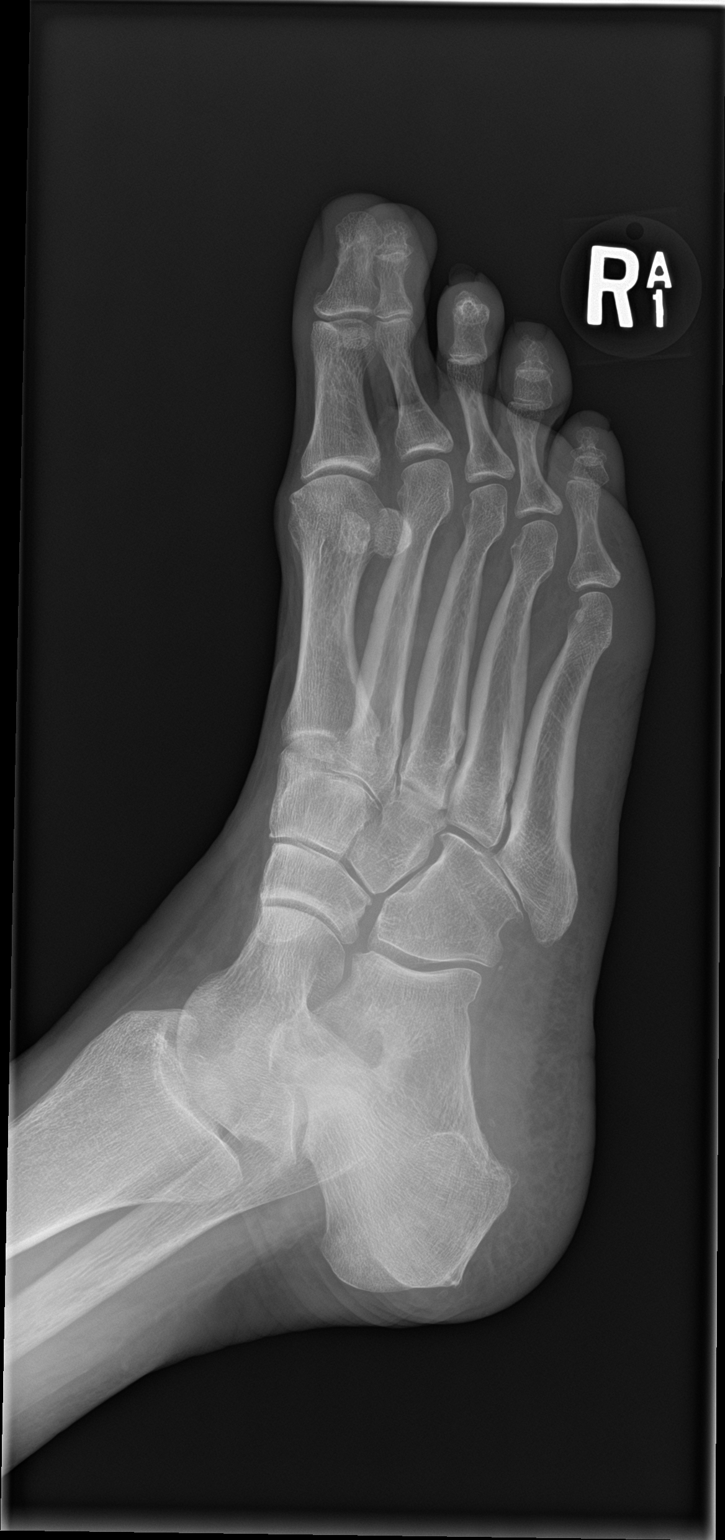

[foot lat]
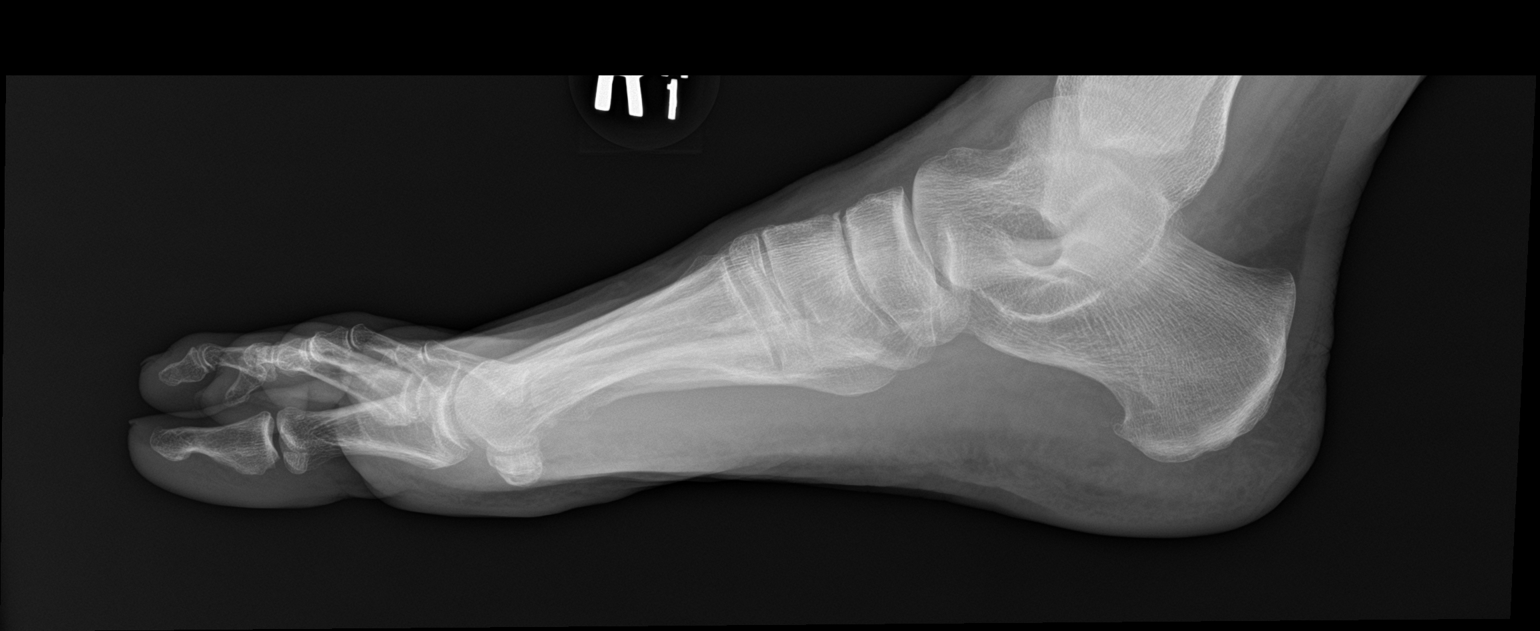

[3 of 3 positions shown; findings below may reference images not displayed]

FINDINGS: Frontal, oblique, and lateral views were obtained. There is no
evident fracture or dislocation. There is persistent flexion of the
third DIP joint. There is no appreciable joint space narrowing or
erosion. No evident radiopaque foreign body.
IMPRESSION: No fracture or dislocation. Flexion of third DIP joint. No
appreciable arthropathy. No radiopaque foreign body evident.

## 2016-12-14 NOTE — ED Provider Notes (Signed)
[facility]    CSN: [id number] Arrival date & time: 12/14/16  1422     History   Chief Complaint Chief Complaint  Patient presents with  . Foot Pain    HPI Sarah Underwood is a 71 y.o. female.   HPI  Sarah Underwood is a 71 y.o. female presenting to UC with c/o sudden onset Right ankle and foot pain and swelling that started a few hours PTA after she accidentally rolled her ankle.  Pain is aching, 5/10, worse with movement and palpation.  No pain medication taken PTA.  No other injuries.    Past Medical History:  Diagnosis Date  . Arthritis   . Asthma   . Basal cell carcinoma 1994   right side of node  . Benign positional vertigo    with rolling over   . Breast cancer ([place]) 1995 & 2001   left-left mastectomy, left chest wall recurrence, radiation x 7 weeks  . Complication of anesthesia    slow waking up  . Dysrhythmia   . Family history of adverse reaction to anesthesia    pts mother had postop vomiting   . GERD (gastroesophageal reflux disease)   . Graves disease 2007   thyroid nodule  . Hard of hearing   . Heart murmur   . History of radiation therapy   . Hypertension   . Nocturia   . Numbness and tingling    bilat more per right hand  . Ovarian mass    small solid nodule 8x62mm: Right ovary - stable since 2000  . PONV (postoperative nausea and vomiting)    pt states occurred with pain meds questions darvocet   . Seasonal allergies   . Vertigo   . Wears glasses     Patient Active Problem List   Diagnosis Date Noted  . Leg edema, left 09/26/2016  . Post-nasal drainage 03/30/2016  . OA (osteoarthritis) of hip 09/08/2015  . Mild persistent asthma, uncomplicated 85/27/7824  . Pre-operative respiratory examination 06/29/2015  . Hip pain, bilateral 12/14/2014  . Knee pain, bilateral 12/14/2014  . Skin cancer   . Postmenopausal atrophic vaginitis 02/23/2013  . Breast cancer [facility])     Past Surgical History:  Procedure Laterality Date  .  APPENDECTOMY    . basal cell removal  7/94   right nose  . BREAST BIOPSY Right 1987   benign fibroadenoma  . breast cancer recurrence Left 01/2000   at site of left chest wall after previous mastectomy with removal and treatment with radiaton.  Marland Kitchen CARPAL TUNNEL RELEASE Right 05/08/2013   Procedure: RIGHT CARPAL TUNNEL RELEASE;  Surgeon: Cammie Sickle., MD;  Location: [facility];  Service: Orthopedics;  Laterality: Right;  . CATARACT EXTRACTION     03/20/14 left eye, 12/15 right eye  . COLPOSCOPY W/ BIOPSY / CURETTAGE  03/12/12   benign, but + HR HPV  . EYE SURGERY     bilat cataract surgery   . MASTECTOMY Left 3/95   Stage IIb 0/14 LN ER/ PR + with mastectomy - Dr. Mendel Ryder  . MOHS SURGERY     right nare  . [place]   lt  . TONSILLECTOMY AND ADENOIDECTOMY  1950  . TOTAL HIP ARTHROPLASTY Right 09/08/2015   Procedure: TOTAL HIP ARTHROPLASTY ANTERIOR APPROACH;  Surgeon: Gaynelle Arabian, MD;  Location: [facility];  Service: Orthopedics;  Laterality: Right;    OB History    Gravida Para Term Preterm AB Living  1 1 1     1    SAB TAB Ectopic Multiple Live Births                   Home Medications    Prior to Admission medications   Medication Sig Start Date End Date Taking? Authorizing Provider  acetaminophen (TYLENOL) 650 MG CR tablet Take 1,300 mg by mouth as needed.     [provider]  albuterol (PROVENTIL HFA;VENTOLIN HFA) 108 (90 Base) MCG/ACT inhaler Inhale 2 puffs into the lungs every 4 (four) hours as needed for wheezing. 06/20/15   Darlyne Russian, MD  amLODipine (NORVASC) 5 MG tablet Take 5 mg by mouth daily. for blood pressure 08/16/15   [provider]  anastrozole (ARIMIDEX) 1 MG tablet TAKE 1 TABLET BY MOUTH  DAILY 10/31/16   Ladell Pier, MD  Artificial Tear Solution (TEARS NATURALE OP) Place 2 drops into both eyes daily as needed (For dry eyes.).    [provider]  calcium-vitamin D 250-100 MG-UNIT tablet Take 1  tablet by mouth 2 (two) times daily.    [provider]  Cetirizine HCl (ZYRTEC ALLERGY PO) Take by mouth as needed.     [provider]  Cholecalciferol (VITAMIN D) 2000 units CAPS Take by mouth.    [provider]  FLOVENT HFA 110 MCG/ACT inhaler INHALE 2 PUFFS INTO THE LUNGS 2 (TWO) TIMES DAILY. 07/14/16   Brand Males, MD  ibuprofen (ADVIL,MOTRIN) 600 MG tablet Take 600 mg by mouth every 6 (six) hours as needed.    [provider]  MAGNESIUM CITRATE PO Take 1 tablet by mouth 2 (two) times daily.    [provider]  Menaquinone-7 (VITAMIN K2) 100 MCG CAPS Take by mouth.    [provider]  methocarbamol (ROBAXIN) 500 MG tablet Take 1 tablet (500 mg total) by mouth every 6 (six) hours as needed for muscle spasms. 09/10/15   Perkins, Alexzandrew L, PA-C  Multiple Vitamin (MULTIVITAMIN) tablet Take 1 tablet by mouth daily.    [provider]  omeprazole (PRILOSEC) 20 MG capsule Take 20 mg by mouth daily.  04/09/14   [provider]  OVER THE COUNTER MEDICATION Apply 1 application topically daily. Cetaphil Eczema Calming Body Wash    [provider]  OVER THE COUNTER MEDICATION Apply 1 application topically daily. Cetaphil Eczema Calming Body Moisturizer    [provider]  valsartan (DIOVAN) 160 MG tablet Take 160 mg by mouth daily. 08/16/15   [provider]    Family History Family History  Problem Relation Age of Onset  . Breast cancer Mother 51       deceased 82  . Osteoporosis Mother   . Hypertension Mother   . Hypertension Father   . Heart attack Father   . Breast cancer Sister 24       bilateral; currently 32  . Diabetes Brother   . Lung cancer Maternal Grandfather        deceased 49  . Cancer Paternal Grandmother        duodenal; deceased 39    Social History Social History  Substance Use Topics  . Smoking status: Never Smoker  . Smokeless tobacco: Never Used  . Alcohol  use 0.0 oz/week     Comment: rare-wine     Allergies   Erythromycin; Penicillins; Hydrochlorothiazide; and Sulfa antibiotics   Review of Systems Review of Systems  Musculoskeletal: Positive for arthralgias, joint swelling and myalgias. Negative for  gait problem.  Skin: Negative for color change and wound.  Neurological: Negative for weakness and numbness.     Physical Exam Triage Vital Signs ED Triage Vitals  Enc Vitals Group     BP 12/14/16 1441 (!) 144/69     Pulse Rate 12/14/16 1441 65     Resp 12/14/16 1441 16     Temp 12/14/16 1441 98 F (36.7 C)     Temp Source 12/14/16 1441 Oral     SpO2 12/14/16 1441 98 %     Weight 12/14/16 1441 145 lb (65.8 kg)     Height 12/14/16 1441 5\' 4"  (1.626 m)     Head Circumference --      Peak Flow --      Pain Score 12/14/16 1446 5     Pain Loc --      Pain Edu? --      Excl. in [place]? --    No data found.   Updated Vital Signs BP (!) 144/69 (BP Location: Right Arm)   Pulse 65   Temp 98 F (36.7 C) (Oral)   Resp 16   Ht 5\' 4"  (1.626 m)   Wt 145 lb (65.8 kg)   LMP 04/25/2003 Comment: spotting  SpO2 98%   BMI 24.89 kg/m      Physical Exam  Constitutional: She is oriented to person, place, and time. She appears well-developed and well-nourished.  HENT:  Head: Normocephalic and atraumatic.  Eyes: EOM are normal.  Neck: Normal range of motion.  Cardiovascular: Normal rate.   Pulmonary/Chest: Effort normal.  Musculoskeletal: Normal range of motion. She exhibits edema and tenderness.  Right ankle and foot: mild edema to lateral malleolus. Mild tenderness. Tenderness to lateral and dorsal aspect of proximal foot. Full ROM ankle and toes Calf is soft, non-tender.  Neurological: She is alert and oriented to person, place, and time.  Skin: Skin is warm and dry. Capillary refill takes less than 2 seconds. No erythema.  Right ankle and foot: skin in tact. No ecchymosis or erythema.   Psychiatric: She has a normal mood and  affect. Her behavior is normal.  Nursing note and vitals reviewed.    UC Treatments / Results  Labs (all labs ordered are listed, but only abnormal results are displayed) Labs Reviewed - No data to display  EKG  EKG Interpretation None       Radiology Dg Ankle Complete Right  Result Date: 12/14/2016 CLINICAL DATA:  Injury. EXAM: RIGHT ANKLE - COMPLETE 3+ VIEW COMPARISON:  12/14/2016. FINDINGS: Mild soft tissue swelling. Tiny corticated bony density noted adjacent to the anterior aspect of the calcaneus is stable most likely tiny secondary ossification center No acute bony or joint abnormality identified. No evidence of fracture dislocation. IMPRESSION: Mild soft tissue swelling.  No acute bony or joint abnormality. Electronically Signed   By: Marcello Moores  Register   On: 12/14/2016 15:20   Dg Foot Complete Right  Result Date: 12/14/2016 CLINICAL DATA:  Pain following rolling injury EXAM: RIGHT FOOT COMPLETE - 3+ VIEW COMPARISON:  None. FINDINGS: Frontal, oblique, and lateral views were obtained. There is no evident fracture or dislocation. There is persistent flexion of the third DIP joint. There is no appreciable joint space narrowing or erosion. No evident radiopaque foreign body. IMPRESSION: No fracture or dislocation. Flexion of third DIP joint. No appreciable arthropathy. No radiopaque foreign body evident. Electronically Signed   By: Lowella Grip III M.D.   On: 12/14/2016 15:09    Procedures Procedures (  including critical care time)  Medications Ordered in UC Medications - No data to display   Initial Impression / Assessment and Plan / UC Course  I have reviewed the triage vital signs and the nursing notes.  Pertinent labs & imaging results that were available during my care of the patient were reviewed by me and considered in my medical decision making (see chart for details).     Hx and exam c/w mild Right ankle sprain.    Final Clinical Impressions(s) / UC  Diagnoses   Final diagnoses:  Right foot pain  Right ankle pain  Sprain of right ankle, unspecified ligament, initial encounter   Encouraged conservative treatment. Rest, ice, compression, elevation. May have Advil and acetaminophen Encouraged f/u with PCP in 2-3 weeks if not improving.   New Prescriptions New Prescriptions   No medications on file     Controlled Substance Prescriptions [place] Controlled Substance Registry consulted? Not Applicable   Tyrell Antonio 12/14/16 1791

## 2016-12-14 NOTE — ED Triage Notes (Signed)
PT reports she rolled right ankle a few hours ago. PT reports pain along outer edge of right foot. PT is ambulatory.

## 2017-01-09 ENCOUNTER — Other Ambulatory Visit: Payer: Self-pay | Admitting: Internal Medicine

## 2017-06-28 ENCOUNTER — Telehealth: Payer: Self-pay | Admitting: Obstetrics & Gynecology

## 2017-06-28 ENCOUNTER — Other Ambulatory Visit (INDEPENDENT_AMBULATORY_CARE_PROVIDER_SITE_OTHER): Payer: Medicare Other

## 2017-06-28 DIAGNOSIS — N839 Noninflammatory disorder of ovary, fallopian tube and broad ligament, unspecified: Secondary | ICD-10-CM

## 2017-06-28 DIAGNOSIS — N838 Other noninflammatory disorders of ovary, fallopian tube and broad ligament: Secondary | ICD-10-CM

## 2017-06-28 NOTE — Telephone Encounter (Signed)
Last aex 06/06/2016. H/O abnormal pap smear with +16 HR HPV.  Neg pap and neg HR HPV 10/14 and 11/15. Normal pap 2018. Asking if she will be having a pap at her next aex.

## 2017-06-28 NOTE — Telephone Encounter (Signed)
Patient has a question about her AEX next month. Patient asked if she will have a pap done?

## 2017-06-29 ENCOUNTER — Encounter: Payer: Self-pay | Admitting: Internal Medicine

## 2017-06-29 ENCOUNTER — Ambulatory Visit: Payer: Medicare Other | Admitting: Internal Medicine

## 2017-06-29 VITALS — BP 108/66 | HR 66 | Ht 64.0 in | Wt 154.6 lb

## 2017-06-29 DIAGNOSIS — R0982 Postnasal drip: Secondary | ICD-10-CM | POA: Diagnosis not present

## 2017-06-29 DIAGNOSIS — J4530 Mild persistent asthma, uncomplicated: Secondary | ICD-10-CM | POA: Diagnosis not present

## 2017-06-29 DIAGNOSIS — J45990 Exercise induced bronchospasm: Secondary | ICD-10-CM

## 2017-06-29 LAB — NITRIC OXIDE: Nitric Oxide: 19

## 2017-06-29 LAB — CA 125: CANCER ANTIGEN (CA) 125: 6.5 U/mL (ref 0.0–38.1)

## 2017-06-29 NOTE — Patient Instructions (Signed)
ICD-10-CM   1. Mild persistent asthma, uncomplicated X52.84 Nitric oxide  2. Post-nasal drainage R09.82   3. Exercise induced bronchospasm J45.990     Stable and well controlled  Plan Continue flovent and flonase scheduled Albuterol as needed + 5-10 min before walks/exercise Also do warm up and cool down 5-10 min before exercise  Followup 9 months or sooner if needed

## 2017-06-29 NOTE — Progress Notes (Signed)
Subjective:     Patient ID: Sarah Underwood, female   DOB: 04/18/1946, 72 y.o.   MRN: [id number]  HPI    PCP Velna Hatchet, MD Referred by Dr Everlene Farrier Ortho: Dr Maureen Ralphs  HPI    IOV 06/29/2015  Chief Complaint  Patient presents with  . Pulmonary Consult    Pt referred by Dr. Everlene Farrier for wheezing and acute bronchitis. Pt states she was told by her PCP that she has COPD. Pt states every couple years she gets bronchitis. Pt c/o mild DOE, prod cough with clear and white mucus, wheezing x 2 weeks. Pt denies CP/tightness. Pt states she has just finished doxy and pred taper.    72 year old female referred for recurrent episodes of wheezing bronchitis.  Reports that in 2011 had episode of viral infection followed by severe acute bronchitis with wheezing that lasted several weeks. This again happened in 2013. Then in 2015 had atrial fibrillation was seen by Dr. Einar Gip and apparently was told that she might have COPD based on chest x-ray hyperinflation according to her report. Most recently admitted February 2017 she had a steroid injection into her right hip because of DJD [she's been using a cane for the last few months and is anticipated to have hip replacement surgery]. Then she took a flight to [facility]. Couple of days after getting that got acutely ill with respiratory infection him a cough, chest tightness, wheezing. Upon her return she's been treated with 10 days of doxycycline and prednisone taper which is ending today. And she is significantly better. She reports that in between episodes she occasionally has wheezing particularly on exposure to cold air or with exertion.   Chest x-ray 06/20/2015: Lung fields are clear as personally visualized and per report. Agree with surgical clips on the left side  Exhaled nitric oxide today is 15 ppb but is on the last of 10 days of prednisone and normal  Blood work hemoglobin 12.6 g 06/20/2015.   reports that she has never smoked. She has never  used smokeless tobacco.    OV 09/29/2015  Chief Complaint  Patient presents with  . Follow-up    pt doing well, last day of PT post hip replacement is tomorrow.  Pt notes R sided sinus congestion,PND.     Fu Mild persistent asthma - clinical dx  This is a follow-up to see how she's doing with inhaled corticosteroids. In the interim she's had a hip surgery. She says that Flovent has helped her significantly. She still has occasional wheezing on exertion outside the house or cold air exposure or allergen exposure. Otherwise feels significantly improved. In the last week or so she does not wake up in the middle of the night of the asthma symptoms when she wakes up she has very mild symptoms. She has very slight limitation with her activities because of asthma and she experiences a little short of breath and wheezing because of asthma and she's use albuterol for rescue just once. The 5 point asthma control question of 0.8 suggesting good control. Of note she's never had pulmonary function testing  OV 03/30/2016  Chief Complaint  Patient presents with  . Follow-up    Pt. had her PFT today, Pt. states her breathing has been doing well, but has noticed the cold weather has affected her breathing with her getting sob more quicky, Pt. has some sinus congestion     Follow-up mild persistent asthma-clinical diagnosis  She is here for routine follow-up. She's been on  Flovent for over a year according to history. She says it has made a significant difference in her respiratory health. She no longer has significant chest tightness cough or wheezing as much as she used to. Albuterol use is been 0. However in the last few weeks because of the change in weather and during the morning walks she notices increased chest tightness and dry cough. She feels albuterol will help her but she has not resorted to using it because the symptoms are transient. When she wakes up in the morning she does have symptoms. There  is significantly worse and a moderate level currently than her baseline. She attributes this to cold weather. She is also having postnasal drainage.  On function test today is normal other than slightly reduced DLCO. She prefers simple line of step up therapy management for this worsening symptoms . Asthma control questionnaire today is 1.4 and worse than baseline     Results for Sarah Underwood, Sarah Underwood (MRN [id number]) as of 03/30/2016 11:15  Ref. Range 03/30/2016 09:48  FVC-Pre Latest Units: L 2.96  FVC-%Pred-Pre Latest Units: % 97  FEV1-Pre Latest Units: L 2.52  FEV1-%Pred-Pre Latest Units: % 109  Pre FEV1/FVC ratio Latest Units: % 85  FEV1FVC-%Pred-Pre Latest Units: % 112   Results for Sarah Underwood, Sarah Underwood (MRN [id number]) as of 03/30/2016 11:15  Ref. Range 03/30/2016 09:48  FVC-Post Latest Units: L 2.93  FVC-%Pred-Post Latest Units: % 96  FVC-%Change-Post Latest Units: % 0  FEV1-Post Latest Units: L 2.59  FEV1-%Pred-Post Latest Units: % 112  FEV1-%Change-Post Latest Units: % 2  Results for Sarah Underwood, Sarah Underwood (MRN [id number]) as of 03/30/2016 11:15  Ref. Range 03/30/2016 09:48  DLCO cor Latest Units: ml/min/mmHg 18.12  DLCO cor % pred Latest Units: % 72  Results for Sarah Underwood, Sarah Underwood (MRN [id number]) as of 03/30/2016 11:15  Ref. Range 03/30/2016 09:48  TLC Latest Units: L 4.64  TLC % pred Latest Units: % 90    OV 09/26/2016  Chief Complaint  Patient presents with  . Follow-up    Pt c/o sinus congestion, PND, watery eyes - pt states this is from her seasonal allergies. Pt states her breathing is at baseline. Pt denies CP/tightness and f/c/s.     Follow-up mild persistent asthma basis of clinical diagnosis and therapeutic response to Flovent  Asthma: She continues to do well. She thinks Flovent worked wonders for her. This is been no exacerbations, no emergency room visits, no prednisone use. No albuterol rescue use. No nighttime symptoms.  New issue: She has spent a week at [place] in [place]. She developed a flight from [place] to [place] and back again in one week. Then this last night noticed a slight increase in left lower extremity edema. She has varicose veins on exam there is no hemoptysis or shortness of breath     OV 06/29/2017  Chief Complaint  Patient presents with  . Follow-up    asthma- mild flare up once, upper chest tightness and congestion, looses voice   Follow-up mild persistent asthma basis of clinical diagnosis and therapeutic response to Flovent  Asthma: It has been 9 months since I last saw her.  In the interim her sister developed stage IV breast cancer and passed away in 28-Apr-2017.  She says after the memorial service with less stress because of the caregiver burden her asthma symptoms are actually better.  She also thinks the Flovent and the Flonase are really helping her.  When his she  has gotten cold or respiratory infection she is is Without having to do prednisone.  The only issue she is having is that when she goes for morning walks in cold air she gets bronchospasm.  She does not use preemptive albuterol.  She does not do any warm up a cold on exercises for this.  She is up-to-date with her flu shot.   Results for Sarah Underwood, Sarah Underwood (MRN [id number]) as of 06/29/2017 11:06  Ref. Range 06/29/2015 16:30 06/29/2017 11:01  Nitric Oxide Unknown 15 19     has a past medical history of Arthritis, Asthma, Basal cell carcinoma (1994), Benign positional vertigo, Breast cancer ([place]) (1884 & 1660), Complication of anesthesia, Dysrhythmia, Family history of adverse reaction to anesthesia, GERD (gastroesophageal reflux disease), Graves disease (2007), Hard of hearing, Heart murmur, History of radiation therapy, Hypertension, Nocturia, Numbness and tingling, Ovarian mass, PONV (postoperative nausea and vomiting), Seasonal allergies, Vertigo, and Wears glasses.   reports that  has never smoked. she has never used smokeless tobacco.  Past  Surgical History:  Procedure Laterality Date  . APPENDECTOMY    . basal cell removal  7/94   right nose  . BREAST BIOPSY Right 1987   benign fibroadenoma  . breast cancer recurrence Left 01/2000   at site of left chest wall after previous mastectomy with removal and treatment with radiaton.  Marland Kitchen CARPAL TUNNEL RELEASE Right 05/08/2013   Procedure: RIGHT CARPAL TUNNEL RELEASE;  Surgeon: Cammie Sickle., MD;  Location: [facility];  Service: Orthopedics;  Laterality: Right;  . CATARACT EXTRACTION     03/20/14 left eye, 12/15 right eye  . COLPOSCOPY W/ BIOPSY / CURETTAGE  03/12/12   benign, but + HR HPV  . EYE SURGERY     bilat cataract surgery   . MASTECTOMY Left 3/95   Stage IIb 0/14 LN ER/ PR + with mastectomy - Dr. Mendel Ryder  . MOHS SURGERY     right nare  . [place]   lt  . TONSILLECTOMY AND ADENOIDECTOMY  1950  . TOTAL HIP ARTHROPLASTY Right 09/08/2015   Procedure: TOTAL HIP ARTHROPLASTY ANTERIOR APPROACH;  Surgeon: Gaynelle Arabian, MD;  Location: [facility];  Service: Orthopedics;  Laterality: Right;    Allergies  Allergen Reactions  . Erythromycin Nausea And Vomiting  . Penicillins Hives and Other (See Comments)    Has patient had a PCN reaction causing immediate rash, facial/tongue/throat swelling, SOB or lightheadedness with hypotension: no Has patient had a PCN reaction causing severe rash involving mucus membranes or skin necrosis: no Has patient had a PCN reaction that required hospitalization no Has patient had a PCN reaction occurring within the last 10 years: no If all of the above answers are "NO", then Underwood proceed with Cephalosporin use.   Marland Kitchen Hydrochlorothiazide Rash    Pt states caused a sunburn type reaction   . Sulfa Antibiotics Rash    Immunization History  Administered Date(s) Administered  . Influenza Whole 02/05/2017  . Influenza, High Dose Seasonal PF 02/14/2016  . Influenza,inj,Quad PF,6+ Mos 02/23/2015  . Pneumococcal  Polysaccharide-23 04/24/2012    Family History  Problem Relation Age of Onset  . Breast cancer Mother 50       deceased 58  . Osteoporosis Mother   . Hypertension Mother   . Hypertension Father   . Heart attack Father   . Breast cancer Sister 53       bilateral; currently 59  . Diabetes Brother   .  Lung cancer Maternal Grandfather        deceased 42  . Cancer Paternal Grandmother        duodenal; deceased 22     Current Outpatient Medications:  .  acetaminophen (TYLENOL) 650 MG CR tablet, Take 1,300 mg by mouth as needed. , Disp: , Rfl:  .  albuterol (PROVENTIL HFA;VENTOLIN HFA) 108 (90 Base) MCG/ACT inhaler, Inhale 2 puffs into the lungs every 4 (four) hours as needed for wheezing., Disp: 1 Inhaler, Rfl: 3 .  amLODipine (NORVASC) 5 MG tablet, Take 5 mg by mouth daily. for blood pressure, Disp: , Rfl: 5 .  anastrozole (ARIMIDEX) 1 MG tablet, TAKE 1 TABLET BY MOUTH  DAILY, Disp: 90 tablet, Rfl: 3 .  Artificial Tear Solution (TEARS NATURALE OP), Place 2 drops into both eyes daily as needed (For dry eyes.)., Disp: , Rfl:  .  calcium-vitamin D 250-100 MG-UNIT tablet, Take 1 tablet by mouth 2 (two) times daily., Disp: , Rfl:  .  Cetirizine HCl (ZYRTEC ALLERGY PO), Take by mouth as needed. , Disp: , Rfl:  .  Cholecalciferol (VITAMIN D) 2000 units CAPS, Take by mouth., Disp: , Rfl:  .  cimetidine (TAGAMET) 200 MG tablet, Take 200 mg by mouth as needed (if taking starts with 2 tablets daily and weans down)., Disp: , Rfl:  .  FLOVENT HFA 110 MCG/ACT inhaler, INHALE 2 PUFFS INTO THE LUNGS TWICE A DAY, Disp: 12 Inhaler, Rfl: 5 .  ibuprofen (ADVIL,MOTRIN) 600 MG tablet, Take 600 mg by mouth every 6 (six) hours as needed., Disp: , Rfl:  .  MAGNESIUM CITRATE PO, Take 1 tablet by mouth 2 (two) times daily., Disp: , Rfl:  .  Menaquinone-7 (VITAMIN K2) 100 MCG CAPS, Take by mouth., Disp: , Rfl:  .  Multiple Vitamin (MULTIVITAMIN) tablet, Take 1 tablet by mouth daily., Disp: , Rfl:  .  olmesartan  (BENICAR) 20 MG tablet, Take 20 mg by mouth daily., Disp: , Rfl: 1 .  OVER THE COUNTER MEDICATION, Apply 1 application topically daily. Cetaphil Eczema Calming Body Wash, Disp: , Rfl:  .  OVER THE COUNTER MEDICATION, Apply 1 application topically daily. Cetaphil Eczema Calming Body Moisturizer, Disp: , Rfl:  .  methocarbamol (ROBAXIN) 500 MG tablet, Take 1 tablet (500 mg total) by mouth every 6 (six) hours as needed for muscle spasms. (Patient not taking: Reported on 06/29/2017), Disp: 90 tablet, Rfl: 0   Review of Systems     Objective:   Physical Exam  Constitutional: She is oriented to person, place, and time. She appears well-developed and well-nourished. No distress.  HENT:  Head: Normocephalic and atraumatic.  Right Ear: External ear normal.  Left Ear: External ear normal.  Mouth/Throat: Oropharynx is clear and moist. No oropharyngeal exudate.  Eyes: Conjunctivae and EOM are normal. Pupils are equal, round, and reactive to light. Right eye exhibits no discharge. Left eye exhibits no discharge. No scleral icterus.  Neck: Normal range of motion. Neck supple. No JVD present. No tracheal deviation present. No thyromegaly present.  Cardiovascular: Normal rate, regular rhythm, normal heart sounds and intact distal pulses. Exam reveals no gallop and no friction rub.  No murmur heard. Pulmonary/Chest: Effort normal and breath sounds normal. No respiratory distress. She has no wheezes. She has no rales. She exhibits no tenderness.  Abdominal: Soft. Bowel sounds are normal. She exhibits no distension and no mass. There is no tenderness. There is no rebound and no guarding.  Musculoskeletal: Normal range of motion. She exhibits  no edema or tenderness.  Lymphadenopathy:    She has no cervical adenopathy.  Neurological: She is alert and oriented to person, place, and time. She has normal reflexes. No cranial nerve deficit. She exhibits normal muscle tone. Coordination normal.  Skin: Skin is warm  and dry. No rash noted. She is not diaphoretic. No erythema. No pallor.  Psychiatric: She has a normal mood and affect. Her behavior is normal. Judgment and thought content normal.  Vitals reviewed.  Vitals:   06/29/17 1055 06/29/17 1056  BP:  108/66  Pulse:  66  SpO2:  98%  Weight: 154 lb 9.6 oz (70.1 kg)   Height: 5\' 4"  (1.626 m)        Assessment:       ICD-10-CM   1. Mild persistent asthma, uncomplicated V76.16 Nitric oxide  2. Post-nasal drainage R09.82   3. Exercise induced bronchospasm J45.990        Plan:      Stable and well controlled  Plan Continue flovent and flonase scheduled Albuterol as needed + 5-10 min before walks/exercise Also do warm up and cool down 5-10 min before exercise  Followup 9 months or sooner if needed    Dr. Brand Males, M.D., [facility].C.P Pulmonary and Critical Care Medicine Staff Physician, [place] Director - Interstitial Lung Disease  Program  Pulmonary [place] [facility] [place], [place], [address]  Pager: [phone], If no answer or between  15:00h - 7:00h: call [phone] Telephone: [phone]

## 2017-07-09 NOTE — Telephone Encounter (Signed)
By the guidelines, she does not need one but we will talk about this when she comes and if she is uncomfortable skipping the pap, then I would do one.  Also, her ca-125 was normal but this was already routed to her so she should have gotten that result already.

## 2017-07-09 NOTE — Telephone Encounter (Signed)
Spoke with patient. Advised of message as seen below from Dr.Miller. Patient verbalizes understanding. Encounter closed. 

## 2017-07-27 ENCOUNTER — Ambulatory Visit: Payer: Medicare Other | Admitting: Obstetrics & Gynecology

## 2017-07-27 ENCOUNTER — Other Ambulatory Visit: Payer: Self-pay

## 2017-07-27 ENCOUNTER — Encounter: Payer: Self-pay | Admitting: Obstetrics & Gynecology

## 2017-07-27 VITALS — BP 142/60 | HR 68 | Resp 16 | Ht 63.25 in | Wt 154.0 lb

## 2017-07-27 DIAGNOSIS — Z01419 Encounter for gynecological examination (general) (routine) without abnormal findings: Secondary | ICD-10-CM

## 2017-07-27 NOTE — Patient Instructions (Addendum)
[facility] Phone: [phone] Fax: [phone] [address]  Dr. Paulla Dolly  Check about the shingrix vaccine at [facility] and [facility].  [facility] [phone] [address]  [address] [place] [address]  [facility] [place] [address]  [place]  [place],  [address]

## 2017-07-27 NOTE — Progress Notes (Signed)
72 y.o. G1P1001 DivorcedCaucasianF here for annual exam.  Having a difficult time due to sister's recurrent breast cancer and death.  Sister died in 03-29-23.  Had recurrence of breast cancer after 12 years.  Took six months for diagnosis.  Pt states it was a "long slog".  Pt twisted ankle last summer.  Went to urgent care.  Had severe sprain.  Now has some plantar fasciitis.  May need to see someone else.  Denies vaginal bleeding.  So happy about how she's done since her hip replacement 5/17.    Patient's last menstrual period was 04/25/2003.          Sexually active: No.  The current method of family planning is post menopausal status.    Exercising: No.   Smoker:  no  Health Maintenance: Pap:  06/06/16 Neg   03/04/14 Neg. HR HPV:neg  History of abnormal Pap:  yes MMG:  02/26/17 [facility] faxing report  Colonoscopy:  2017 f/u 5 years   BMD:   12/23/15 [facility] faxing report  TDaP:  2011  Pneumonia vaccine(s):  2014 Shingrix:   Zostavax 2013.  D/w pt shingrix vaccination. Hep C testing: done with PCP Screening Labs: PCP   reports that she has never smoked. She has never used smokeless tobacco. She reports that she drinks alcohol. She reports that she does not use drugs.  Past Medical History:  Diagnosis Date  . Arthritis   . Asthma   . Basal cell carcinoma 1994   right side of node  . Benign positional vertigo    with rolling over   . Breast cancer ([place]) 1995 & 2001   left-left mastectomy, left chest wall recurrence, radiation x 7 weeks  . Complication of anesthesia    slow waking up  . Dysrhythmia   . Family history of adverse reaction to anesthesia    pts mother had postop vomiting   . GERD (gastroesophageal reflux disease)   . Graves disease 2007   thyroid nodule  . Hard of hearing   . Heart murmur   . History of radiation therapy   . Hypertension   . Nocturia   . Numbness and tingling    bilat more per right hand  . Ovarian mass    small solid nodule 8x22m: Right  ovary - stable since 2000  . PONV (postoperative nausea and vomiting)    pt states occurred with pain meds questions darvocet   . Seasonal allergies   . Vertigo   . Wears glasses     Past Surgical History:  Procedure Laterality Date  . APPENDECTOMY    . basal cell removal  7/94   right nose  . BREAST BIOPSY Right 1987   benign fibroadenoma  . breast cancer recurrence Left 01/2000   at site of left chest wall after previous mastectomy with removal and treatment with radiaton.  .Marland KitchenCARPAL TUNNEL RELEASE Right 05/08/2013   Procedure: RIGHT CARPAL TUNNEL RELEASE;  Surgeon: RCammie Sickle, MD;  Location: [facility]  Service: Orthopedics;  Laterality: Right;  . CATARACT EXTRACTION     03/20/14 left eye, 12/15 right eye  . COLPOSCOPY W/ BIOPSY / CURETTAGE  03/12/12   benign, but + HR HPV  . EYE SURGERY     bilat cataract surgery   . MASTECTOMY Left 3/95   Stage IIb 0/14 LN ER/ PR + with mastectomy - Dr. LMendel Ryder . MOHS SURGERY     right nare  . OVARY SURGERY  1974   lt  . TONSILLECTOMY AND ADENOIDECTOMY  1950  . TOTAL HIP ARTHROPLASTY Right 09/08/2015   Procedure: TOTAL HIP ARTHROPLASTY ANTERIOR APPROACH;  Surgeon: Gaynelle Arabian, MD;  Location: [facility];  Service: Orthopedics;  Laterality: Right;    Current Outpatient Medications  Medication Sig Dispense Refill  . acetaminophen (TYLENOL) 650 MG CR tablet Take 1,300 mg by mouth as needed.     Marland Kitchen albuterol (PROVENTIL HFA;VENTOLIN HFA) 108 (90 Base) MCG/ACT inhaler Inhale 2 puffs into the lungs every 4 (four) hours as needed for wheezing. 1 Inhaler 3  . amLODipine (NORVASC) 5 MG tablet Take 5 mg by mouth daily. for blood pressure  5  . anastrozole (ARIMIDEX) 1 MG tablet TAKE 1 TABLET BY MOUTH  DAILY 90 tablet 3  . Artificial Tear Solution (TEARS NATURALE OP) Place 2 drops into both eyes daily as needed (For dry eyes.).    Marland Kitchen calcium-vitamin D 250-100 MG-UNIT tablet Take 1 tablet by mouth 2 (two) times daily.    .  Cetirizine HCl (ZYRTEC ALLERGY PO) Take by mouth as needed.     . Cholecalciferol (VITAMIN D) 2000 units CAPS Take by mouth.    . cimetidine (TAGAMET) 200 MG tablet Take 200 mg by mouth as needed (if taking starts with 2 tablets daily and weans down).    Marland Kitchen FLOVENT HFA 110 MCG/ACT inhaler INHALE 2 PUFFS INTO THE LUNGS TWICE A DAY 12 Inhaler 5  . fluticasone (FLONASE) 50 MCG/ACT nasal spray Place 1 spray into both nostrils daily.    Marland Kitchen ibuprofen (ADVIL,MOTRIN) 600 MG tablet Take 600 mg by mouth every 6 (six) hours as needed.    Marland Kitchen MAGNESIUM CITRATE PO Take 1 tablet by mouth 2 (two) times daily.    . Menaquinone-7 (VITAMIN K2) 100 MCG CAPS Take by mouth.    . Multiple Vitamin (MULTIVITAMIN) tablet Take 1 tablet by mouth daily.    Marland Kitchen olmesartan (BENICAR) 20 MG tablet Take 20 mg by mouth daily.  1  . OVER THE COUNTER MEDICATION Apply 1 application topically daily. Cetaphil Eczema Calming Body Wash    . OVER THE COUNTER MEDICATION Apply 1 application topically daily. Cetaphil Eczema Calming Body Moisturizer    . methocarbamol (ROBAXIN) 500 MG tablet Take 1 tablet (500 mg total) by mouth every 6 (six) hours as needed for muscle spasms. (Patient not taking: Reported on 06/29/2017) 90 tablet 0   No current facility-administered medications for this visit.     Family History  Problem Relation Age of Onset  . Breast cancer Mother 72       deceased 52  . Osteoporosis Mother   . Hypertension Mother   . Hypertension Father   . Heart attack Father   . Breast cancer Sister 88       bilateral; currently 4  . Diabetes Brother   . Lung cancer Maternal Grandfather        deceased 39  . Cancer Paternal Grandmother        duodenal; deceased 41    Review of Systems  HENT: Positive for congestion and hearing loss.   Musculoskeletal: Positive for myalgias.  Skin: Positive for itching.  All other systems reviewed and are negative.   Exam:   BP (!) 142/60 (BP Location: Right Arm, Patient Position:  Sitting, Cuff Size: Large)   Pulse 68   Resp 16   Ht 5' 3.25" (1.607 m)   Wt 154 lb (69.9 kg)   LMP 04/25/2003 Comment: spotting  BMI 27.06  kg/m   Height: 5' 3.25" (160.7 cm)  Ht Readings from Last 3 Encounters:  07/27/17 5' 3.25" (1.607 m)  06/29/17 '5\' 4"'  (1.626 m)  12/14/16 '5\' 4"'  (1.626 m)    General appearance: alert, cooperative and appears stated age Head: Normocephalic, without obvious abnormality, atraumatic Neck: no adenopathy, supple, symmetrical, trachea midline and thyroid normal to inspection and palpation Lungs: clear to auscultation bilaterally Breasts:  Surgically absent left breast with skin radiation changes, right breast without masses, skin changes, LAD Heart: regular rate and rhythm Abdomen: soft, non-tender; bowel sounds normal; no masses,  no organomegaly Extremities: extremities normal, atraumatic, no cyanosis or edema Skin: Skin color, texture, turgor normal. No rashes or lesions Lymph nodes: Cervical, supraclavicular, and axillary nodes normal. No abnormal inguinal nodes palpated Neurologic: Grossly normal   Pelvic: External genitalia:  no lesions              Urethra:  normal appearing urethra with no masses, tenderness or lesions              Bartholins and Skenes: normal                 Vagina: normal appearing vagina with normal color and discharge, no lesions              Cervix: no lesions              Pap taken: No. Bimanual Exam:  Uterus:  normal size, contour, position, consistency, mobility, non-tender              Adnexa: normal adnexa and no mass, fullness, tenderness               Rectovaginal: Confirms               Anus:  normal sphincter tone, no lesions  Chaperone was present for exam.  A:  Well Woman with normal exam PMP, no HRT S/p left breast cancer 3/95 and 1/02.  S/p mastectomy, radiation and Arimidex after recurrent.  BRCA testing was negative. Atrophic vaginal changes H/O solid adnexal mass, negative ultrasound 2018 Family  hx of breast cancer Osteopenia, on Arimidex  P:   Mammogram guidelines reviewed pap smear neg 2018.  Repeat next year. Will plan ca-125 next year as well. Release for BMD was signed today Pt will investigate getting the Shingrix vaccination Information about podiatry given BMD due this year return annually or prn

## 2017-08-07 ENCOUNTER — Encounter: Payer: Self-pay | Admitting: Obstetrics & Gynecology

## 2017-08-07 ENCOUNTER — Telehealth: Payer: Self-pay | Admitting: *Deleted

## 2017-08-07 NOTE — Telephone Encounter (Signed)
Called patient re: BMD results  "Please let patient know BMD showed Osteopenia (bone thinning only). Plan to repeat in 3-4 years) - Dr. Sabra Heck.  Left Voicemail for patient with results.  Encounter closed.  BMD report to scan

## 2017-08-31 ENCOUNTER — Inpatient Hospital Stay: Payer: Medicare Other | Attending: Oncology | Admitting: Oncology

## 2017-08-31 ENCOUNTER — Telehealth: Payer: Self-pay | Admitting: Oncology

## 2017-08-31 VITALS — BP 134/66 | HR 70 | Temp 98.6°F | Resp 18 | Ht 63.25 in | Wt 155.2 lb

## 2017-08-31 DIAGNOSIS — Z170 Estrogen receptor positive status [ER+]: Secondary | ICD-10-CM

## 2017-08-31 DIAGNOSIS — C50511 Malignant neoplasm of lower-outer quadrant of right female breast: Secondary | ICD-10-CM

## 2017-08-31 DIAGNOSIS — C50812 Malignant neoplasm of overlapping sites of left female breast: Secondary | ICD-10-CM | POA: Insufficient documentation

## 2017-08-31 NOTE — Telephone Encounter (Signed)
Scheduled appt per 5/10 los - sent reminder letter in the mail with appt date and time. F/u in one year.

## 2017-08-31 NOTE — Progress Notes (Signed)
  [facility] OFFICE PROGRESS NOTE   Diagnosis: Breast cancer  INTERVAL HISTORY:   Ms. Naas returns for a scheduled visit.  She feels well.  No change over the chest wall.  She is grieving from the recent death of her sister.  Her sister died of metastatic breast cancer. Ms. Elisabeth Cara continues Arimidex.  She has hot flashes and arthralgias.  Right mammogram was negative on 12/27/2016  Objective:  Vital signs in last 24 hours:  Blood pressure 134/66, pulse 70, temperature 98.6 F (37 C), temperature source Oral, resp. rate 18, height 5' 3.25" (1.607 m), weight 155 lb 3.2 oz (70.4 kg), last menstrual period 04/25/2003, SpO2 99 %.    HEENT: Neck without mass Lymphatics: No cervical, supraclavicular, or left axillary nodes.  "Shotty "right axillary node versus a prominent fat pad. Resp: Lungs clear bilaterally Cardio: Regular rate and rhythm GI: No hepatomegaly Vascular: No leg edema Breast: Status post left mastectomy.  No evidence for chest wall tumor recurrence.  Right breast without mass.   Medications: I have reviewed the patient's current medications.   Assessment/Plan: Ms. Hayworth has a history of left-sided breast cancer dating to 27. She developed a chest wall recurrence in October 2001. She remains in clinical remission. She will continue indefinite Arimidex.     Disposition: Ms. Bacorn will return for an office visit in 1 year.  She will be scheduled for a mammogram and bone density scan in September 2019.  We will contact the genetics counselor to see if there is a recommendation for additional genetic testing.  15 minutes were spent with the patient today.  The majority of the time was used for counseling and coordination of care.  Betsy Coder, MD  08/31/2017  11:44 AM

## 2017-09-03 ENCOUNTER — Encounter: Payer: Self-pay | Admitting: Genetic Counselor

## 2017-10-25 ENCOUNTER — Other Ambulatory Visit: Payer: Self-pay | Admitting: Oncology

## 2017-10-30 ENCOUNTER — Telehealth: Payer: Self-pay | Admitting: Internal Medicine

## 2017-10-30 MED ORDER — ALBUTEROL SULFATE HFA 108 (90 BASE) MCG/ACT IN AERS: 2.0000 | INHALATION_SPRAY | RESPIRATORY_TRACT | 3 refills | Status: AC | PRN

## 2017-10-30 NOTE — Telephone Encounter (Signed)
Rx sent to pt's preferred pharmacy.  Attempted to call pt but line went straight to VM.  Left a detailed message for pt stating this had been done. Nothing further needed.

## 2017-11-23 ENCOUNTER — Other Ambulatory Visit: Payer: Self-pay | Admitting: Internal Medicine

## 2017-11-26 DIAGNOSIS — Z96641 Presence of right artificial hip joint: Secondary | ICD-10-CM | POA: Insufficient documentation

## 2017-12-10 ENCOUNTER — Other Ambulatory Visit: Payer: Self-pay

## 2017-12-10 DIAGNOSIS — Z170 Estrogen receptor positive status [ER+]: Principal | ICD-10-CM

## 2017-12-10 DIAGNOSIS — C50511 Malignant neoplasm of lower-outer quadrant of right female breast: Secondary | ICD-10-CM

## 2017-12-10 MED ORDER — ANASTROZOLE 1 MG PO TABS
1.0000 mg | ORAL_TABLET | Freq: Every day | ORAL | 3 refills | Status: DC
Start: 2017-12-10 — End: 2018-11-25

## 2018-01-07 ENCOUNTER — Encounter: Payer: Self-pay | Admitting: Oncology

## 2018-06-19 ENCOUNTER — Ambulatory Visit (HOSPITAL_COMMUNITY)
Admission: EM | Admit: 2018-06-19 | Discharge: 2018-06-19 | Disposition: A | Discharge status: Home / Self Care | Payer: Medicare Other | Attending: Family Medicine | Admitting: Family Medicine

## 2018-06-19 ENCOUNTER — Encounter (HOSPITAL_COMMUNITY): Payer: Self-pay

## 2018-06-19 DIAGNOSIS — J069 Acute upper respiratory infection, unspecified: Secondary | ICD-10-CM

## 2018-06-19 DIAGNOSIS — B9789 Other viral agents as the cause of diseases classified elsewhere: Secondary | ICD-10-CM | POA: Diagnosis not present

## 2018-06-19 MED ORDER — HYDROCODONE-HOMATROPINE 5-1.5 MG/5ML PO SYRP: 5.0000 mL | ORAL_SOLUTION | Freq: Four times a day (QID) | ORAL | 0 refills | Status: DC | PRN

## 2018-06-19 NOTE — ED Triage Notes (Signed)
Pt presents with productive cough with clear mucus, shortness of breath, and sore throat X 7 days.

## 2018-06-19 NOTE — ED Provider Notes (Signed)
[facility]   [id number] 06/19/18 Arrival Time: 0903  ASSESSMENT & PLAN:  1. Viral URI with cough    Declines Rx prednisone.  Meds ordered this encounter  Medications  . HYDROcodone-homatropine (HYCODAN) 5-1.5 MG/5ML syrup    Sig: Take 5 mLs by mouth every 6 (six) hours as needed for cough.    Dispense:  90 mL    Refill:  0   Has f/u appt with her pulmonologist in approx one week. May f/u here as needed.  Cough medication sedation precautions. OTC symptom care as needed. Ensure adequate fluid intake and rest.  Reviewed expectations re: course of current medical issues. Questions answered. Outlined signs and symptoms indicating need for more acute intervention. Patient verbalized understanding. After Visit Summary given.   SUBJECTIVE: History from: patient.  Sarah Underwood is a 73 y.o. female who presents with complaint of nasal congestion, post-nasal drainage, and a persistent dry cough; with sore throat. Onset abrupt, approx 6-7 days ago; without fatigue and without body aches. SOB: none. Wheezing: moderate when present; albuterol inhaler with temporary relief. Cough is affecting sleep. Fever: no. Overall normal PO intake without n/v. Known sick contacts: yes, plane travel before symptoms started; passenger coughing. No specific or significant aggravating or alleviating factors reported. OTC treatment: Mucinex with mild relief.  Received flu shot this year: yes.  Social History   Tobacco Use  Smoking Status Never Smoker  Smokeless Tobacco Never Used    ROS: As per HPI. Patient is alert and oriented times three.    OBJECTIVE:  Vitals:   06/19/18 0949  BP: (!) 152/73  Pulse: 78  Resp: 17  Temp: 98.7 F (37.1 C)  TempSrc: Oral  SpO2: 100%    General appearance: alert; appears fatigued HEENT: nasal congestion; clear runny nose; throat with moderate erythema/irritation likely secondary to post-nasal drainage; no tonsil enlargement or  exudates Neck: supple without LAD CV: RRR Lungs: unlabored respirations, symmetrical air entry without active wheezing; cough: mild to moderate; no respiratory distress Abd: soft Ext: no LE edema Skin: warm and dry Psychological: alert and cooperative; normal mood and affect   Allergies  Allergen Reactions  . Erythromycin Nausea And Vomiting  . Penicillins Hives and Other (See Comments)    Has patient had a PCN reaction causing immediate rash, facial/tongue/throat swelling, SOB or lightheadedness with hypotension: no Has patient had a PCN reaction causing severe rash involving mucus membranes or skin necrosis: no Has patient had a PCN reaction that required hospitalization no Has patient had a PCN reaction occurring within the last 10 years: no If all of the above answers are "NO", then may proceed with Cephalosporin use.   Marland Kitchen Hydrochlorothiazide Rash    Pt states caused a sunburn type reaction   . Sulfa Antibiotics Rash    Past Medical History:  Diagnosis Date  . Arthritis   . Asthma   . Basal cell carcinoma 1994   right side of node  . Benign positional vertigo    with rolling over   . Breast cancer ([facility]) 1995 & 2001   left-left mastectomy, left chest wall recurrence, radiation x 7 weeks  . Complication of anesthesia    slow waking up  . Dysrhythmia   . Family history of adverse reaction to anesthesia    pts mother had postop vomiting   . GERD (gastroesophageal reflux disease)   . Graves disease 2007   thyroid nodule  . Hard of hearing   . Heart murmur   .  History of radiation therapy   . Hypertension   . Nocturia   . Numbness and tingling    bilat more per right hand  . Ovarian mass    small solid nodule 8x76mm: Right ovary - stable since 2000  . PONV (postoperative nausea and vomiting)    pt states occurred with pain meds questions darvocet   . Seasonal allergies   . Vertigo   . Wears glasses    Family History  Problem Relation Age of Onset  . Breast  cancer Mother 55       deceased 32  . Osteoporosis Mother   . Hypertension Mother   . Hypertension Father   . Heart attack Father   . Breast cancer Sister 75       bilateral; currently 1  . Diabetes Brother   . Lung cancer Maternal Grandfather        deceased 80  . Cancer Paternal Grandmother        duodenal; deceased 69   Social History   Socioeconomic History  . Marital status: Divorced    Spouse name: Not on file  . Number of children: 1  . Years of education: Not on file  . Highest education level: Not on file  Occupational History    Employer: [employer]  Social Needs  . Financial resource strain: Not on file  . Food insecurity:    Worry: Not on file    Inability: Not on file  . Transportation needs:    Medical: Not on file    Non-medical: Not on file  Tobacco Use  . Smoking status: Never Smoker  . Smokeless tobacco: Never Used  Substance and Sexual Activity  . Alcohol use: Yes    Alcohol/week: 0.0 standard drinks    Comment: rare-wine  . Drug use: No  . Sexual activity: Not Currently    Birth control/protection: Abstinence  Lifestyle  . Physical activity:    Days per week: Not on file    Minutes per session: Not on file  . Stress: Not on file  Relationships  . Social connections:    Talks on phone: Not on file    Gets together: Not on file    Attends religious service: Not on file    Active member of club or organization: Not on file    Attends meetings of clubs or organizations: Not on file    Relationship status: Not on file  . Intimate partner violence:    Fear of current or ex partner: Not on file    Emotionally abused: Not on file    Physically abused: Not on file    Forced sexual activity: Not on file  Other Topics Concern  . Not on file  Social History Narrative  . Not on file           Vanessa Kick, MD 06/19/18 1028

## 2018-06-19 NOTE — Discharge Instructions (Addendum)
Be aware, your cough medication may cause drowsiness. Please do not drive, operate heavy machinery or make important decisions while on this medication, it can cloud your judgement.  

## 2018-07-09 ENCOUNTER — Other Ambulatory Visit: Payer: Self-pay

## 2018-07-09 ENCOUNTER — Ambulatory Visit (INDEPENDENT_AMBULATORY_CARE_PROVIDER_SITE_OTHER): Payer: Medicare Other | Admitting: Nurse Practitioner

## 2018-07-09 ENCOUNTER — Encounter: Payer: Self-pay | Admitting: Nurse Practitioner

## 2018-07-09 DIAGNOSIS — J4530 Mild persistent asthma, uncomplicated: Secondary | ICD-10-CM | POA: Diagnosis not present

## 2018-07-09 NOTE — Progress Notes (Signed)
@Patient  ID: Beacher May, female    DOB: [birth year], 73 y.o.   MRN: [id number]  Chief Complaint  Patient presents with   Follow-up    lingering cough    Referring provider: Velna Hatchet, MD  HPI 73 year old female never smoker with mild persistent asthma followed by Dr. Chase Caller.  Tests/ recent events:  CXR 06/20/15 - No active cardiopulmonary disease.  06/30/18 - Last visit with Dr. Chase Caller Follow-up mild persistent asthma basis of clinical diagnosis and therapeutic response to Flovent  Asthma: It has been 9 months since I last saw her.  In the interim her sister developed stage IV breast cancer and passed away in 04-22-17.  She says after the memorial service with less stress because of the caregiver burden her asthma symptoms are actually better.  She also thinks the Flovent and the Flonase are really helping her.  When his she has gotten cold or respiratory infection she is is Without having to do prednisone.  The only issue she is having is that when she goes for morning walks in cold air she gets bronchospasm.  She does not use preemptive albuterol.  She does not do any warm up a cold on exercises for this.  She is up-to-date with her flu shot. Stable and well controlled Plan Continue flovent and flonase scheduled Albuterol as needed + 5-10 min before walks/exercise Also do warm up and cool down 5-10 min before exercise Followup 9 months or sooner if needed  Lab Results  Component Value Date   NITRICOXIDE 19 06/29/2017     OV 07/10/18 - Follow up Presents today for one-year follow-up.  She states that this is been a stable interval for her.  Patient was seen in the ED on 06/19/2018 for a viral URI.  He was not tested for flu or Covid-19.  States that she has recovered well.  She does has a slight lingering cough and some lingering nasal congestion.  Denies any recent fever.  She is compliant with Flonase, Flovent, Proventil as needed.  She has states that  Flovent and Flonase do help her symptoms and keep her controlled. Denies f/c/s, n/v/d, hemoptysis, PND, leg swelling.       Allergies  Allergen Reactions   Erythromycin Nausea And Vomiting   Penicillins Hives and Other (See Comments)    Has patient had a PCN reaction causing immediate rash, facial/tongue/throat swelling, SOB or lightheadedness with hypotension: no Has patient had a PCN reaction causing severe rash involving mucus membranes or skin necrosis: no Has patient had a PCN reaction that required hospitalization no Has patient had a PCN reaction occurring within the last 10 years: no If all of the above answers are "NO", then may proceed with Cephalosporin use.    Hydrochlorothiazide Rash    Pt states caused a sunburn type reaction    Sulfa Antibiotics Rash    Immunization History  Administered Date(s) Administered   Influenza Whole 02/05/2017   Influenza, High Dose Seasonal PF 02/14/2016   Influenza,inj,Quad PF,6+ Mos 02/23/2015   Pneumococcal Polysaccharide-23 04/24/2012    Past Medical History:  Diagnosis Date   Arthritis    Asthma    Basal cell carcinoma 1994   right side of node   Benign positional vertigo    with rolling over    Breast cancer ([facility]) 1995 & 2001   left-left mastectomy, left chest wall recurrence, radiation x 7 weeks   Complication of anesthesia    slow waking up   Dysrhythmia  Family history of adverse reaction to anesthesia    pts mother had postop vomiting    GERD (gastroesophageal reflux disease)    Graves disease 2007   thyroid nodule   Hard of hearing    Heart murmur    History of radiation therapy    Hypertension    Nocturia    Numbness and tingling    bilat more per right hand   Ovarian mass    small solid nodule 8x4mm: Right ovary - stable since 2000   PONV (postoperative nausea and vomiting)    pt states occurred with pain meds questions darvocet    Seasonal allergies    Vertigo    Wears  glasses     Tobacco History: Social History   Tobacco Use  Smoking Status Never Smoker  Smokeless Tobacco Never Used   Counseling given: Not Answered   Outpatient Encounter Medications as of 07/09/2018  Medication Sig   acetaminophen (TYLENOL) 650 MG CR tablet Take 1,300 mg by mouth as needed.    albuterol (PROVENTIL HFA;VENTOLIN HFA) 108 (90 Base) MCG/ACT inhaler Inhale 2 puffs into the lungs every 4 (four) hours as needed for wheezing.   amLODipine (NORVASC) 5 MG tablet Take 5 mg by mouth daily. for blood pressure   anastrozole (ARIMIDEX) 1 MG tablet Take 1 tablet (1 mg total) by mouth daily.   Artificial Tear Solution (TEARS NATURALE OP) Place 2 drops into both eyes daily as needed (For dry eyes.).   calcium-vitamin D 250-100 MG-UNIT tablet Take 1 tablet by mouth 2 (two) times daily.   Cetirizine HCl (ZYRTEC ALLERGY PO) Take by mouth as needed.    Cholecalciferol (VITAMIN D) 2000 units CAPS Take by mouth.   cimetidine (TAGAMET) 200 MG tablet Take 200 mg by mouth as needed (if taking starts with 2 tablets daily and weans down).   FLOVENT HFA 110 MCG/ACT inhaler INHALE 2 PUFFS INTO THE LUNGS TWICE A DAY   fluticasone (FLONASE) 50 MCG/ACT nasal spray Place 1 spray into both nostrils daily.   HYDROcodone-homatropine (HYCODAN) 5-1.5 MG/5ML syrup Take 5 mLs by mouth every 6 (six) hours as needed for cough.   ibuprofen (ADVIL,MOTRIN) 600 MG tablet Take 600 mg by mouth every 6 (six) hours as needed.   MAGNESIUM CITRATE PO Take 1 tablet by mouth 2 (two) times daily.   Menaquinone-7 (VITAMIN K2) 100 MCG CAPS Take by mouth.   methocarbamol (ROBAXIN) 500 MG tablet Take 1 tablet (500 mg total) by mouth every 6 (six) hours as needed for muscle spasms.   Multiple Vitamin (MULTIVITAMIN) tablet Take 1 tablet by mouth daily.   NEOMYCIN-POLYMYXIN-HYDROCORTISONE (CORTISPORIN) 1 % SOLN OTIC solution INSTILL 3 DROPS INTO LEFT EAR 3 TIMES DAILY X 3 DAYS THEN 1 DROP INTO LEFT EAR AT  BEDTIME X 7 DAYS   olmesartan (BENICAR) 20 MG tablet Take 20 mg by mouth daily.   OVER THE COUNTER MEDICATION Apply 1 application topically daily. Cetaphil Eczema Calming Body Wash   OVER THE COUNTER MEDICATION Apply 1 application topically daily. Cetaphil Eczema Calming Body Moisturizer   No facility-administered encounter medications on file as of 07/09/2018.      Review of Systems  Review of Systems  Constitutional: Negative.  Negative for chills and fever.  HENT: Negative.   Respiratory: Positive for cough. Negative for shortness of breath and wheezing.   Cardiovascular: Negative.  Negative for chest pain, palpitations and leg swelling.  Gastrointestinal: Negative.   Allergic/Immunologic: Negative.   Neurological: Negative.   Psychiatric/Behavioral:  Negative.        Physical Exam  BP 138/86 (BP Location: Right Arm, Cuff Size: Normal)    Pulse 68    Ht 5\' 4"  (1.626 m)    Wt 161 lb (73 kg)    LMP 04/25/2003 Comment: spotting   SpO2 97%    BMI 27.64 kg/m   Wt Readings from Last 5 Encounters:  07/09/18 161 lb (73 kg)  08/31/17 155 lb 3.2 oz (70.4 kg)  07/27/17 154 lb (69.9 kg)  06/29/17 154 lb 9.6 oz (70.1 kg)  12/14/16 145 lb (65.8 kg)     Physical Exam Vitals signs and nursing note reviewed.  Constitutional:      General: She is not in acute distress.    Appearance: She is well-developed.  Cardiovascular:     Rate and Rhythm: Normal rate and regular rhythm.  Pulmonary:     Effort: Pulmonary effort is normal. No respiratory distress.     Breath sounds: Normal breath sounds. No wheezing or rhonchi.  Musculoskeletal:        General: No swelling.  Neurological:     Mental Status: She is alert and oriented to person, place, and time.       Assessment & Plan:   Mild persistent asthma, uncomplicated Patient has been stable and well controlled.  She is almost completely recovered from a recent URI.  Patient Instructions  Mild persistent asthma,  uncomplicated Well controlled  Plan Continue flovent as before scheduled Use albuterol as needed Flu shot in fall  Followup 9 mnths or sooner if needed   Coronavirus (COVID-19) Are you at risk?  Are you at risk for the Coronavirus (COVID-19)?  To be considered HIGH RISK for Coronavirus (COVID-19), you have to meet the following criteria:   Traveled to [place], [place], [place], [place] or [place]; or in the [place] to [employer], [place], [place], or [place]; and have fever, cough, and shortness of breath within the last 2 weeks of travel OR  Been in close contact with a person diagnosed with COVID-19 within the last 2 weeks and have fever, cough, and shortness of breath  IF YOU DO NOT MEET THESE CRITERIA, YOU ARE CONSIDERED LOW RISK FOR COVID-19.  What to do if you are HIGH RISK for COVID-19?   If you are having a medical emergency, call 911.  Seek medical care right away. Before you go to a doctors office, urgent care or emergency department, call ahead and tell them about your recent travel, contact with someone diagnosed with COVID-19, and your symptoms. You should receive instructions from your physicians office regarding next steps of care.   When you arrive at healthcare provider, tell the healthcare staff immediately you have returned from visiting [place], [place], [place], [place] or [place]; or traveled in the [place] to [place], [place], [place], or [place]; in the last two weeks or you have been in close contact with a person diagnosed with COVID-19 in the last 2 weeks.    Tell the health care staff about your symptoms: fever, cough and shortness of breath.  After you have been seen by a medical provider, you will be either: o Tested for (COVID-19) and discharged home on quarantine except to seek medical care if symptoms worsen, and asked to  - Stay home and avoid contact with others until you get your results (4-5 days)  - Avoid travel on  public transportation if possible (such as bus, train, or airplane)  or o Sent to the Emergency Department by EMS for evaluation, COVID-19 testing, and possible admission depending on your condition and test results.  What to do if you are LOW RISK for COVID-19?  Reduce your risk of any infection by using the same precautions used for avoiding the common cold or flu:   Wash your hands often with soap and warm water for at least 20 seconds.  If soap and water are not readily available, use an alcohol-based hand sanitizer with at least 60% alcohol.   If coughing or sneezing, cover your mouth and nose by coughing or sneezing into the elbow areas of your shirt or coat, into a tissue or into your sleeve (not your hands).  Avoid shaking hands with others and consider head nods or verbal greetings only.  Avoid touching your eyes, nose, or mouth with unwashed hands.   Avoid close contact with people who are sick.  Avoid places or events with large numbers of people in one location, like concerts or sporting events.  Carefully consider travel plans you have or are making.  If you are planning any travel outside or inside the [place], visit the [place] webpage for the latest health notices.  If you have some symptoms but not all symptoms, continue to monitor at home and seek medical attention if your symptoms worsen.  If you are having a medical emergency, call 911.   ADDITIONAL HEALTHCARE OPTIONS FOR PATIENTS  [facility] / e-Visit: [email or web address]         [facility]: [facility]: [phone]                   [facility]: [phone]         Fenton Foy, NP 07/10/2018

## 2018-07-09 NOTE — Patient Instructions (Signed)
Mild persistent asthma, uncomplicated Well controlled  Plan Continue flovent as before scheduled Use albuterol as needed Flu shot in fall  Followup 9 mnths or sooner if needed   Coronavirus (COVID-19) Are you at risk?  Are you at risk for the Coronavirus (COVID-19)?  To be considered HIGH RISK for Coronavirus (COVID-19), you have to meet the following criteria:  . Traveled to [place], [place], [place], [place] or [place]; or in the [place] to [place], [place], [place], or [place]; and have fever, cough, and shortness of breath within the last 2 weeks of travel OR . Been in close contact with a person diagnosed with COVID-19 within the last 2 weeks and have fever, cough, and shortness of breath . IF YOU DO NOT MEET THESE CRITERIA, YOU ARE CONSIDERED LOW RISK FOR COVID-19.  What to do if you are HIGH RISK for COVID-19?  Marland Kitchen If you are having a medical emergency, call 911. . Seek medical care right away. Before you go to a doctor's office, urgent care or emergency department, call ahead and tell them about your recent travel, contact with someone diagnosed with COVID-19, and your symptoms. You should receive instructions from your physician's office regarding next steps of care.  . When you arrive at healthcare provider, tell the healthcare staff immediately you have returned from visiting [place], [place], [place], [place] or [place]; or traveled in the [place] to [place], [place], [place], or [place]; in the last two weeks or you have been in close contact with a person diagnosed with COVID-19 in the last 2 weeks.   . Tell the health care staff about your symptoms: fever, cough and shortness of breath. . After you have been seen by a medical provider, you will be either: o Tested for (COVID-19) and discharged home on quarantine except to seek medical care if symptoms worsen, and asked to  - Stay home and avoid contact with others until you get your results (4-5  days)  - Avoid travel on public transportation if possible (such as bus, train, or airplane) or o Sent to the Emergency Department by EMS for evaluation, COVID-19 testing, and possible admission depending on your condition and test results.  What to do if you are LOW RISK for COVID-19?  Reduce your risk of any infection by using the same precautions used for avoiding the common cold or flu:  Marland Kitchen Wash your hands often with soap and warm water for at least 20 seconds.  If soap and water are not readily available, use an alcohol-based hand sanitizer with at least 60% alcohol.  . If coughing or sneezing, cover your mouth and nose by coughing or sneezing into the elbow areas of your shirt or coat, into a tissue or into your sleeve (not your hands). . Avoid shaking hands with others and consider head nods or verbal greetings only. . Avoid touching your eyes, nose, or mouth with unwashed hands.  . Avoid close contact with people who are sick. . Avoid places or events with large numbers of people in one location, like concerts or sporting events. . Carefully consider travel plans you have or are making. . If you are planning any travel outside or inside the [place], visit the CDC's Travelers' Health webpage for the latest health notices. . If you have some symptoms but not all symptoms, continue to monitor at home and seek medical attention if your symptoms worsen. . If you are having a medical emergency, call  [facility] / e-Visit: [email or web address]         [facility]: [facility]: [phone]                   [facility]: [phone]

## 2018-07-10 ENCOUNTER — Encounter: Payer: Self-pay | Admitting: Nurse Practitioner

## 2018-07-10 NOTE — Assessment & Plan Note (Signed)
Patient has been stable and well controlled.  She is almost completely recovered from a recent URI.  Patient Instructions  Mild persistent asthma, uncomplicated Well controlled  Plan Continue flovent as before scheduled Use albuterol as needed Flu shot in fall  Followup 9 mnths or sooner if needed   Coronavirus (COVID-19) Are you at risk?  Are you at risk for the Coronavirus (COVID-19)?  To be considered HIGH RISK for Coronavirus (COVID-19), you have to meet the following criteria:  . Traveled to [place], [place], [place], [place] or [place]; or in the [place] to [place], [place], [place], or [place]; and have fever, cough, and shortness of breath within the last 2 weeks of travel OR . Been in close contact with a person diagnosed with COVID-19 within the last 2 weeks and have fever, cough, and shortness of breath . IF YOU DO NOT MEET THESE CRITERIA, YOU ARE CONSIDERED LOW RISK FOR COVID-19.  What to do if you are HIGH RISK for COVID-19?  Marland Kitchen If you are having a medical emergency, call 911. . Seek medical care right away. Before you go to a doctor's office, urgent care or emergency department, call ahead and tell them about your recent travel, contact with someone diagnosed with COVID-19, and your symptoms. You should receive instructions from your physician's office regarding next steps of care.  . When you arrive at healthcare provider, tell the healthcare staff immediately you have returned from visiting [place], [place], [place], [place] or [place]; or traveled in the [place] to [place], [place], [place], or [place]; in the last two weeks or you have been in close contact with a person diagnosed with COVID-19 in the last 2 weeks.   . Tell the health care staff about your symptoms: fever, cough and shortness of breath. . After you have been seen by a medical provider, you will be either: o Tested for (COVID-19) and discharged home on quarantine except to  seek medical care if symptoms worsen, and asked to  - Stay home and avoid contact with others until you get your results (4-5 days)  - Avoid travel on public transportation if possible (such as bus, train, or airplane) or o Sent to the Emergency Department by EMS for evaluation, COVID-19 testing, and possible admission depending on your condition and test results.  What to do if you are LOW RISK for COVID-19?  Reduce your risk of any infection by using the same precautions used for avoiding the common cold or flu:  Marland Kitchen Wash your hands often with soap and warm water for at least 20 seconds.  If soap and water are not readily available, use an alcohol-based hand sanitizer with at least 60% alcohol.  . If coughing or sneezing, cover your mouth and nose by coughing or sneezing into the elbow areas of your shirt or coat, into a tissue or into your sleeve (not your hands). . Avoid shaking hands with others and consider head nods or verbal greetings only. . Avoid touching your eyes, nose, or mouth with unwashed hands.  . Avoid close contact with people who are sick. . Avoid places or events with large numbers of people in one location, like concerts or sporting events. . Carefully consider travel plans you have or are making. . If you are planning any travel outside or inside the [place], visit the CDC's Travelers' Health webpage for the latest health notices. . If you have some symptoms but not all symptoms, continue  to monitor at home and seek medical attention if your symptoms worsen. . If you are having a medical emergency, call 911.   [place] / e-Visit: [email or web address]         [facility]: [facility]: [phone]                   [facility]: [phone]

## 2018-08-27 ENCOUNTER — Telehealth: Payer: Self-pay | Admitting: Oncology

## 2018-08-27 NOTE — Telephone Encounter (Signed)
Patient on 5/8 reschedule list. Called patient re pushing appointment out 2 months. Per patient she feels something under her left arm on the mastectomy side. Per patient she doesn't feel a anything in the mastectomy site but does feel something toward the back, under the arm. Per patient it is also painful, however she always feels pain there but this is new and weird for about a month.  Appointment left as scheduled for 5/8. Patient aware. Above message to GBS/desk nurse.

## 2018-08-30 ENCOUNTER — Other Ambulatory Visit: Payer: Self-pay

## 2018-08-30 ENCOUNTER — Inpatient Hospital Stay: Payer: Medicare Other | Attending: Oncology | Admitting: Oncology

## 2018-08-30 ENCOUNTER — Telehealth: Payer: Self-pay | Admitting: Oncology

## 2018-08-30 VITALS — BP 148/80 | HR 73 | Temp 97.8°F | Resp 18 | Ht 64.0 in | Wt 160.0 lb

## 2018-08-30 DIAGNOSIS — C50812 Malignant neoplasm of overlapping sites of left female breast: Secondary | ICD-10-CM | POA: Diagnosis present

## 2018-08-30 DIAGNOSIS — M8588 Other specified disorders of bone density and structure, other site: Secondary | ICD-10-CM | POA: Insufficient documentation

## 2018-08-30 DIAGNOSIS — C50511 Malignant neoplasm of lower-outer quadrant of right female breast: Secondary | ICD-10-CM

## 2018-08-30 DIAGNOSIS — Z170 Estrogen receptor positive status [ER+]: Secondary | ICD-10-CM

## 2018-08-30 DIAGNOSIS — Z79811 Long term (current) use of aromatase inhibitors: Secondary | ICD-10-CM

## 2018-08-30 DIAGNOSIS — R52 Pain, unspecified: Secondary | ICD-10-CM | POA: Diagnosis not present

## 2018-08-30 DIAGNOSIS — Z9012 Acquired absence of left breast and nipple: Secondary | ICD-10-CM | POA: Diagnosis not present

## 2018-08-30 NOTE — Progress Notes (Signed)
  [facility] OFFICE PROGRESS NOTE   Diagnosis: Breast cancer  INTERVAL HISTORY:   Ms. Schmuhl continues Arimidex.  She had an upper respiratory infection in March.  During this infection she noted increased "swelling "at the posterior left axilla.  There is associated pain.  The pain is mild and constant, the swelling is intermittent.  She is not taking pain medication.  No mass. She otherwise feels well.  Good appetite.  A mammogram on 01/07/2018 was negative.  Objective:  Vital signs in last 24 hours:  Blood pressure (!) 148/80, pulse 73, temperature 97.8 F (36.6 C), temperature source Oral, resp. rate 18, height 5\' 4"  (1.626 m), weight 160 lb (72.6 kg), last menstrual period 04/25/2003, SpO2 99 %.    HEENT: Neck without mass Lymphatics: No cervical, supraclavicular, or axillary nodes Resp: Lungs clear bilaterally Cardio: Regular rate and rhythm GI: No hepatomegaly Vascular: No leg or arm edema Rest: Status post left mastectomy.  Radiation telangiectasias over the left chest wall.  No evidence for chest wall tumor recurrence Musculoskeletal: Slight soft tissue prominence at the posterior aspect of the left axilla/chest wall compared to the right side.  No discrete mass.  Mild diffuse tenderness at the inferior aspect of the left axilla and lateral chest wall beneath the mastectomy scar.    Lab Results:  Lab Results  Component Value Date   WBC 17.1 (H) 09/10/2015   HGB 11.3 (L) 09/10/2015   HCT 33.0 (L) 09/10/2015   MCV 85.1 09/10/2015   PLT 305 09/10/2015    CMP  Lab Results  Component Value Date   NA 137 09/10/2015   K 3.9 09/10/2015   CL 102 09/10/2015   CO2 28 09/10/2015   GLUCOSE 125 (H) 09/10/2015   BUN 8 09/10/2015   CREATININE 0.68 09/10/2015   CALCIUM 8.8 (L) 09/10/2015   PROT 7.6 08/31/2015   ALBUMIN 4.5 08/31/2015   AST 21 08/31/2015   ALT 18 08/31/2015   ALKPHOS 74 08/31/2015   BILITOT 0.6 08/31/2015   GFRNONAA >60 09/10/2015   GFRAA >60 09/10/2015    Medications: I have reviewed the patient's current medications.   Assessment/Plan: Ms. Spells was diagnosed with left-sided breast cancer 1995.  She developed a chest wall recurrence in October 2001.  She will continue indefinite Arimidex. Ms Stigler is in clinical remission.  I have a low suspicion for recurrent breast cancer causing the left chest wall symptoms.  I suspect her symptoms are related to a musculoskeletal condition, lymphedema, or neuropathic pain from surgery/radiation.  She will contact us for consistent/increased pain or a palpable change. She will be scheduled for a mammogram in September.  She will return for an office visit in 1 year. A bone density scan in September 2019 revealed osteopenia at the left femoral neck.  She is taking calcium and vitamin D.  Betsy Coder, MD  08/30/2018  2:08 PM

## 2018-08-30 NOTE — Telephone Encounter (Signed)
Scheduled appt per 5/8 los. ° °A calendar will be mailed out. °

## 2018-09-02 ENCOUNTER — Other Ambulatory Visit: Payer: Self-pay | Admitting: Internal Medicine

## 2018-09-06 ENCOUNTER — Encounter: Payer: Self-pay | Admitting: *Deleted

## 2018-09-06 NOTE — Progress Notes (Signed)
Checked with Roma Kayser, genetics counselor and unless she has had any changes in her medical history she does not need to be seen again at this time. MD notified.

## 2018-09-09 ENCOUNTER — Other Ambulatory Visit: Payer: Self-pay

## 2018-09-09 ENCOUNTER — Encounter (HOSPITAL_COMMUNITY): Payer: Self-pay

## 2018-09-09 ENCOUNTER — Ambulatory Visit (HOSPITAL_COMMUNITY)
Admission: EM | Admit: 2018-09-09 | Discharge: 2018-09-09 | Disposition: A | Discharge status: Home / Self Care | Payer: Medicare Other | Attending: Internal Medicine | Admitting: Internal Medicine

## 2018-09-09 DIAGNOSIS — S0993XA Unspecified injury of face, initial encounter: Secondary | ICD-10-CM

## 2018-09-09 NOTE — ED Triage Notes (Signed)
Patient presents to Urgent Care with complaints of bleeding from the roof of her mouth since cutting it while eating toasted pita bread. Patient reports she thought something was stuck, and when she scraped it off with a toothbrush, a gush of blood came out of her mouth. Bleeding controlled at this time.

## 2018-09-09 NOTE — ED Provider Notes (Signed)
[facility]    CSN: [id number] Arrival date & time: 09/09/18  1941     History   Chief Complaint Chief Complaint  Patient presents with  . mouth problem    HPI Sarah Underwood is a 73 y.o. female.   She was eating toasted bread earlier today, and felt like a piece became lodged in the roof of her mouth.  It was difficult to remove, and finally she went upstairs and scraped the roof of her mouth with a soft toothbrush, and bleeding subsequently occurred.  She was able to swallow without difficulty, just wanted to have the area looked at.  No bleeding at present.  The roof of her mouth felt fine prior to this episode.    HPI  Past Medical History:  Diagnosis Date  . Arthritis   . Asthma   . Basal cell carcinoma 1994   right side of node  . Benign positional vertigo    with rolling over   . Breast cancer ([facility] [facility]) 1995 & 2001   left-left mastectomy, left chest wall recurrence, radiation x 7 weeks  . Complication of anesthesia    slow waking up  . Dysrhythmia   . Family history of adverse reaction to anesthesia    pts mother had postop vomiting   . GERD (gastroesophageal reflux disease)   . Graves disease 2007   thyroid nodule  . Hard of hearing   . Heart murmur   . History of radiation therapy   . Hypertension   . Nocturia   . Numbness and tingling    bilat more per right hand  . Ovarian mass    small solid nodule 8x53mm: Right ovary - stable since 2000  . PONV (postoperative nausea and vomiting)    pt states occurred with pain meds questions darvocet   . Seasonal allergies   . Vertigo   . Wears glasses     Patient Active Problem List   Diagnosis Date Noted  . Leg edema, left 09/26/2016  . Post-nasal drainage 03/30/2016  . OA (osteoarthritis) of hip 09/08/2015  . Mild persistent asthma, uncomplicated 79/05/4095  . Pre-operative respiratory examination 06/29/2015  . Hip pain, bilateral 12/14/2014  . Knee pain, bilateral 12/14/2014  . Skin cancer    . Postmenopausal atrophic vaginitis 02/23/2013  . Breast cancer [facility])     Past Surgical History:  Procedure Laterality Date  . APPENDECTOMY    . basal cell removal  7/94   right nose  . BREAST BIOPSY Right 1987   benign fibroadenoma  . breast cancer recurrence Left 01/2000   at site of left chest wall after previous mastectomy with removal and treatment with radiaton.  Marland Kitchen CARPAL TUNNEL RELEASE Right 05/08/2013   Procedure: RIGHT CARPAL TUNNEL RELEASE;  Surgeon: Cammie Sickle., MD;  Location: [facility];  Service: Orthopedics;  Laterality: Right;  . CATARACT EXTRACTION     03/20/14 left eye, 12/15 right eye  . COLPOSCOPY W/ BIOPSY / CURETTAGE  03/12/12   benign, but + HR HPV  . EYE SURGERY     bilat cataract surgery   . MASTECTOMY Left 3/95   Stage IIb 0/14 LN ER/ PR + with mastectomy - Dr. Mendel Ryder  . MOHS SURGERY     right nare  . [place]   lt  . TONSILLECTOMY AND ADENOIDECTOMY  1950  . TOTAL HIP ARTHROPLASTY Right 09/08/2015   Procedure: TOTAL HIP ARTHROPLASTY ANTERIOR APPROACH;  Surgeon: Gaynelle Arabian,  MD;  Location: [facility];  Service: Orthopedics;  Laterality: Right;    OB History    Gravida  1   Para  1   Term  1   Preterm      AB      Living  1     SAB      TAB      Ectopic      Multiple      Live Births               Home Medications    Prior to Admission medications   Medication Sig Start Date End Date Taking? Authorizing Provider  acetaminophen (TYLENOL) 650 MG CR tablet Take 1,300 mg by mouth as needed.     [provider]  albuterol (PROVENTIL HFA;VENTOLIN HFA) 108 (90 Base) MCG/ACT inhaler Inhale 2 puffs into the lungs every 4 (four) hours as needed for wheezing. 10/30/17   Brand Males, MD  amLODipine (NORVASC) 5 MG tablet Take 5 mg by mouth daily. for blood pressure 08/16/15   [provider]  anastrozole (ARIMIDEX) 1 MG tablet Take 1 tablet (1 mg total) by mouth daily. 12/10/17    Ladell Pier, MD  Artificial Tear Solution (TEARS NATURALE OP) Place 2 drops into both eyes daily as needed (For dry eyes.).    [provider]  calcium-vitamin D 250-100 MG-UNIT tablet Take 1 tablet by mouth 2 (two) times daily.    [provider]  Cetirizine HCl (ZYRTEC ALLERGY PO) Take by mouth as needed.     [provider]  Cholecalciferol (VITAMIN D) 2000 units CAPS Take by mouth.    [provider]  cimetidine (TAGAMET) 200 MG tablet Take 200 mg by mouth as needed (if taking starts with 2 tablets daily and weans down).    [provider]  FLOVENT HFA 110 MCG/ACT inhaler INHALE 2 PUFFS INTO THE LUNGS TWICE A DAY 09/02/18   Brand Males, MD  fluticasone (FLONASE) 50 MCG/ACT nasal spray Place 1 spray into both nostrils daily.    [provider]  ibuprofen (ADVIL,MOTRIN) 600 MG tablet Take 600 mg by mouth every 6 (six) hours as needed.    [provider]  MAGNESIUM CITRATE PO Take 1 tablet by mouth 2 (two) times daily.    [provider]  Menaquinone-7 (VITAMIN K2) 100 MCG CAPS Take by mouth.    [provider]  Multiple Vitamin (MULTIVITAMIN) tablet Take 1 tablet by mouth daily.    [provider]  NEOMYCIN-POLYMYXIN-HYDROCORTISONE (CORTISPORIN) 1 % SOLN OTIC solution INSTILL 3 DROPS INTO LEFT EAR 3 TIMES DAILY X 3 DAYS THEN 1 DROP INTO LEFT EAR AT BEDTIME X 7 DAYS 07/31/17   [provider]  olmesartan (BENICAR) 20 MG tablet Take 20 mg by mouth daily. 04/11/17   [provider]  OVER THE COUNTER MEDICATION Apply 1 application topically daily. Cetaphil Eczema Calming Body Wash    [provider]  OVER THE COUNTER MEDICATION Apply 1 application topically daily. Cetaphil Eczema Calming Body Moisturizer    [provider]    Family History Family History  Problem Relation Age of Onset  . Breast cancer Mother 77       deceased 56  . Osteoporosis Mother   .  Hypertension Mother   . Hypertension Father   . Heart attack Father   . Breast cancer Sister 39       bilateral; currently 41  . Diabetes Brother   .  Lung cancer Maternal Grandfather        deceased 57  . Cancer Paternal Grandmother        duodenal; deceased 86    Social History Social History   Tobacco Use  . Smoking status: Never Smoker  . Smokeless tobacco: Never Used  Substance Use Topics  . Alcohol use: Yes    Alcohol/week: 0.0 standard drinks    Comment: rare-wine  . Drug use: No     Allergies   Erythromycin; Penicillins; Hydrochlorothiazide; and Sulfa antibiotics   Review of Systems Review of Systems  All other systems reviewed and are negative.    Physical Exam Triage Vital Signs ED Triage Vitals  Enc Vitals Group     BP 09/09/18 2023 (!) 178/78     Pulse Rate 09/09/18 2023 70     Resp 09/09/18 2023 18     Temp 09/09/18 2023 98.2 F (36.8 C)     Temp Source 09/09/18 2023 Oral     SpO2 09/09/18 2023 100 %     Weight --      Height --      Pain Score 09/09/18 2022 0     Pain Loc --    Updated Vital Signs BP (!) 178/78 (BP Location: Right Arm)   Pulse 70   Temp 98.2 F (36.8 C) (Oral)   Resp 18   LMP 04/25/2003 Comment: spotting  SpO2 100%  Physical Exam Vitals signs and nursing note reviewed.  Constitutional:      General: She is not in acute distress.    Comments: Alert, nicely groomed  HENT:     Head: Atraumatic.     Comments: 1.5 inch area overlying the soft palate to the left of midline is erythematous, edematous, and excoriated.  Lightly tender to palpation.  Airway appears widely patent.  Patient is pink and there is no respiratory distress.  No stridor. Eyes:     Comments: Conjugate gaze, no eye redness/drainage  Neck:     Musculoskeletal: Neck supple.  Cardiovascular:     Rate and Rhythm: Normal rate.  Pulmonary:     Effort: No respiratory distress.  Abdominal:     General: There is no distension.  Musculoskeletal: Normal  range of motion.     Comments: No leg swelling  Skin:    General: Skin is warm and dry.     Comments: No cyanosis  Neurological:     Mental Status: She is alert and oriented to person, place, and time.       Final Clinical Impressions(s) / UC Diagnoses   Final diagnoses:  Mouth injury, initial encounter     Discharge Instructions     Anticipate healing of abraded area on roof of mouth over the next several days.  Rinse mouth with hydrogen peroxide 1-2 times daily as desired.  Recheck if increasing pain or not improving as expected.    ED Prescriptions    None       Wynona Luna, MD 09/22/18 1620

## 2018-09-09 NOTE — Discharge Instructions (Addendum)
Anticipate healing of abraded area on roof of mouth over the next several days.  Rinse mouth with hydrogen peroxide 1-2 times daily as desired.  Recheck if increasing pain or not improving as expected.

## 2018-10-02 ENCOUNTER — Other Ambulatory Visit: Payer: Self-pay

## 2018-10-04 ENCOUNTER — Other Ambulatory Visit: Payer: Self-pay

## 2018-10-04 ENCOUNTER — Encounter: Payer: Self-pay | Admitting: Obstetrics & Gynecology

## 2018-10-04 ENCOUNTER — Other Ambulatory Visit (HOSPITAL_COMMUNITY)
Admission: RE | Admit: 2018-10-04 | Discharge: 2018-10-04 | Disposition: A | Discharge status: Home / Self Care | Payer: Medicare Other | Source: Ambulatory Visit | Attending: Obstetrics & Gynecology | Admitting: Obstetrics & Gynecology

## 2018-10-04 ENCOUNTER — Ambulatory Visit (INDEPENDENT_AMBULATORY_CARE_PROVIDER_SITE_OTHER): Payer: Medicare Other | Admitting: Obstetrics & Gynecology

## 2018-10-04 VITALS — BP 132/60 | HR 64 | Temp 97.8°F | Ht 62.75 in | Wt 159.0 lb

## 2018-10-04 DIAGNOSIS — Z124 Encounter for screening for malignant neoplasm of cervix: Secondary | ICD-10-CM

## 2018-10-04 DIAGNOSIS — Z01419 Encounter for gynecological examination (general) (routine) without abnormal findings: Secondary | ICD-10-CM

## 2018-10-04 DIAGNOSIS — N9489 Other specified conditions associated with female genital organs and menstrual cycle: Secondary | ICD-10-CM

## 2018-10-04 NOTE — Progress Notes (Signed)
73 y.o. G75P1001 Divorced White or Caucasian female here for annual exam.  Doing well.  Feels she has gotten through the grief of her sister's death.    Last appt with Dr. Benay Spice in early May.  She is on Arimidex indefinitely.  Has noted a hernia, abdominally.  It is always reducible.      Denies vaginal bleeding.    Reports she's had three issues of a pressure sensation on her check while lying flat and resting.  This is associated with SOB.  It has resolved spontaneously each of the three times.  She does not have any symptoms of SOB or chest pressure/pain with exertion.  PCP:  Dr. Jackelyn Poling.  Has appt next week with last work.    Patient's last menstrual period was 04/25/2003.          Sexually active: No.  The current method of family planning is post menopausal status.    Exercising: No.   Smoker:  no  Health Maintenance: Pap:  06/06/16 Neg   03/04/14 Neg. HR HPV:neg  History of abnormal Pap:  yes MMG:  01/07/18 Right Breast BIRADS1:neg  Colonoscopy:  2017 f/u 5 years  BMD:   01/07/18 osteopenia, -2.4 TDaP:  06/2009 Pneumonia vaccine(s):  2014 Shingrix:   No Hep C testing: PCP Screening Labs: PCP   reports that she has never smoked. She has never used smokeless tobacco. She reports current alcohol use. She reports that she does not use drugs.  Past Medical History:  Diagnosis Date  . Arthritis   . Asthma   . Basal cell carcinoma 1994   right side of node  . Benign positional vertigo    with rolling over   . Breast cancer ([facility]) 1995 & 2001   left-left mastectomy, left chest wall recurrence, radiation x 7 weeks  . Complication of anesthesia    slow waking up  . Dysrhythmia   . Family history of adverse reaction to anesthesia    pts mother had postop vomiting   . GERD (gastroesophageal reflux disease)   . Graves disease 2007   thyroid nodule  . Hard of hearing   . Heart murmur   . History of radiation therapy   . Hypertension   . Nocturia   . Numbness and  tingling    bilat more per right hand  . Ovarian mass    small solid nodule 8x64m: Right ovary - stable since 2000  . PONV (postoperative nausea and vomiting)    pt states occurred with pain meds questions darvocet   . Seasonal allergies   . Vertigo   . Wears glasses     Past Surgical History:  Procedure Laterality Date  . APPENDECTOMY    . basal cell removal  7/94   right nose  . BREAST BIOPSY Right 1987   benign fibroadenoma  . breast cancer recurrence Left 01/2000   at site of left chest wall after previous mastectomy with removal and treatment with radiaton.  .Marland KitchenCARPAL TUNNEL RELEASE Right 05/08/2013   Procedure: RIGHT CARPAL TUNNEL RELEASE;  Surgeon: RCammie Sickle, MD;  Location: [facility]  Service: Orthopedics;  Laterality: Right;  . CATARACT EXTRACTION     03/20/14 left eye, 12/15 right eye  . COLPOSCOPY W/ BIOPSY / CURETTAGE  03/12/12   benign, but + HR HPV  . EYE SURGERY     bilat cataract surgery   . MASTECTOMY Left 3/95   Stage IIb 0/14 LN ER/ PR +  with mastectomy - Dr. Mendel Ryder  . MOHS SURGERY     right nare  . [place]   lt  . TONSILLECTOMY AND ADENOIDECTOMY  1950  . TOTAL HIP ARTHROPLASTY Right 09/08/2015   Procedure: TOTAL HIP ARTHROPLASTY ANTERIOR APPROACH;  Surgeon: Gaynelle Arabian, MD;  [facility];  Service: Orthopedics;  Laterality: Right;    Current Outpatient Medications  Medication Sig Dispense Refill  . acetaminophen (TYLENOL) 650 MG CR tablet Take 1,300 mg by mouth as needed.     Marland Kitchen albuterol (PROVENTIL HFA;VENTOLIN HFA) 108 (90 Base) MCG/ACT inhaler Inhale 2 puffs into the lungs every 4 (four) hours as needed for wheezing. 1 Inhaler 3  . amLODipine (NORVASC) 2.5 MG tablet Take 1 tablet by mouth daily.    Marland Kitchen anastrozole (ARIMIDEX) 1 MG tablet Take 1 tablet (1 mg total) by mouth daily. 90 tablet 3  . Artificial Tear Solution (TEARS NATURALE OP) Place 2 drops into both eyes daily as needed (For dry eyes.).    Marland Kitchen  calcium-vitamin D 250-100 MG-UNIT tablet Take 1 tablet by mouth 2 (two) times daily.    . Cetirizine HCl (ZYRTEC ALLERGY PO) Take by mouth as needed.     . Cholecalciferol (VITAMIN D) 2000 units CAPS Take by mouth.    . cimetidine (TAGAMET) 200 MG tablet Take 200 mg by mouth as needed (if taking starts with 2 tablets daily and weans down).    Marland Kitchen FLOVENT HFA 110 MCG/ACT inhaler INHALE 2 PUFFS INTO THE LUNGS TWICE A DAY 36 Inhaler 3  . ibuprofen (ADVIL,MOTRIN) 600 MG tablet Take 600 mg by mouth every 6 (six) hours as needed.    Marland Kitchen MAGNESIUM CITRATE PO Take 1 tablet by mouth 2 (two) times daily.    . Menaquinone-7 (VITAMIN K2) 100 MCG CAPS Take by mouth.    . Multiple Vitamin (MULTIVITAMIN) tablet Take 1 tablet by mouth daily.    . NEOMYCIN-POLYMYXIN-HYDROCORTISONE (CORTISPORIN) 1 % SOLN OTIC solution INSTILL 3 DROPS INTO LEFT EAR 3 TIMES DAILY X 3 DAYS THEN 1 DROP INTO LEFT EAR AT BEDTIME X 7 DAYS  0  . NON FORMULARY Allergy Injections once a month    . olmesartan (BENICAR) 20 MG tablet Take 20 mg by mouth daily.  1   No current facility-administered medications for this visit.     Family History  Problem Relation Age of Onset  . Breast cancer Mother 12       deceased 21  . Osteoporosis Mother   . Hypertension Mother   . Hypertension Father   . Heart attack Father   . Breast cancer Sister 54       bilateral; currently 33  . Diabetes Brother   . Lung cancer Maternal Grandfather        deceased 43  . Cancer Paternal Grandmother        duodenal; deceased 6    Review of Systems  All other systems reviewed and are negative.   Exam:   BP 132/60   Pulse 64   Temp 97.8 F (36.6 C) (Temporal)   Ht 5' 2.75" (1.594 m)   Wt 159 lb (72.1 kg)   LMP 04/25/2003 Comment: spotting  BMI 28.39 kg/m   Height: 5' 2.75" (159.4 cm)  Ht Readings from Last 3 Encounters:  10/04/18 5' 2.75" (1.594 m)  08/30/18 '5\' 4"'  (1.626 m)  07/09/18 '5\' 4"'  (1.626 m)    General appearance: alert, cooperative  and appears stated age Head: Normocephalic, without obvious  abnormality, atraumatic Neck: no adenopathy, supple, symmetrical, trachea midline and thyroid normal to inspection and palpation Lungs: clear to auscultation bilaterally Breasts: absent left breast, no masses/skin changes/LAD, nipple discharge on the right Heart: regular rate and rhythm Abdomen: soft, non-tender; bowel sounds normal; no masses,  no organomegaly Extremities: extremities normal, atraumatic, no cyanosis or edema Skin: Skin color, texture, turgor normal. No rashes or lesions Lymph nodes: Cervical, supraclavicular, and axillary nodes normal. No abnormal inguinal nodes palpated Neurologic: Grossly normal   Pelvic: External genitalia:  no lesions              Urethra:  normal appearing urethra with no masses, tenderness or lesions              Bartholins and Skenes: normal                 Vagina: atrophic changes, no lesions              Cervix: no lesions              Pap taken: Yes.   Bimanual Exam:  Uterus:  normal size, contour, position, consistency, mobility, non-tender              Adnexa: normal adnexa and no mass, fullness, tenderness               Rectovaginal: Confirms               Anus:  normal sphincter tone, no lesions  Chaperone was present for exam.  A:  Well Woman with normal exam PMP, no HRT S/p left breast cancer 3/95, 1/02.  S/p mastectomy, radiation and armidex after recurrent.  BRCA testing was negative. Atrophic vaginal changes HO solid annexa mass, negative ultrasound 2018 (ultrasound was poor quality) Three episodes of chest pressure/SOB while lying flat  P:   Mammogram yearly on right.   pap smear obtained today.  This will be the last one unless she has new issues Has blood work scheduled next week Ca-125 will be obtained today.  Am not planning repeat PUS unless has new issues.  She is having shingrix vaccination  Return annually or prn

## 2018-10-05 LAB — CA 125: Cancer Antigen (CA) 125: 5.3 U/mL (ref 0.0–38.1)

## 2018-10-09 LAB — CYTOLOGY - PAP: Diagnosis: NEGATIVE

## 2018-11-25 ENCOUNTER — Other Ambulatory Visit: Payer: Self-pay | Admitting: *Deleted

## 2018-11-25 DIAGNOSIS — C50511 Malignant neoplasm of lower-outer quadrant of right female breast: Secondary | ICD-10-CM

## 2018-11-25 MED ORDER — ANASTROZOLE 1 MG PO TABS
1.0000 mg | ORAL_TABLET | Freq: Every day | ORAL | 3 refills | Status: DC
Start: 2018-11-25 — End: 2019-12-17

## 2019-04-25 DIAGNOSIS — N2889 Other specified disorders of kidney and ureter: Secondary | ICD-10-CM

## 2019-04-25 HISTORY — DX: Other specified disorders of kidney and ureter: N28.89

## 2019-05-23 ENCOUNTER — Encounter: Payer: Self-pay | Admitting: Registered"

## 2019-05-23 ENCOUNTER — Encounter: Payer: Medicare PPO | Attending: Internal Medicine | Admitting: Registered"

## 2019-05-23 DIAGNOSIS — R7303 Prediabetes: Secondary | ICD-10-CM | POA: Diagnosis not present

## 2019-05-23 NOTE — Progress Notes (Signed)
On 05/23/2019 patient completed Core Session 1 of Diabetes Prevention Program course virtually with [facility]. The following learning objectives were met by the patient during this class:   Learning Objectives:   Be able to explain the purpose and benefits of the National Diabetes Prevention Program.   Be able to describe the events that will take place at every session.   Know the weight loss and physical activity goals established by the [facility] Diabetes Prevention Program.   Know their own individual weight loss and physical activity goals.   Be able to explain the important effect of self-monitoring on behavior change.   Goals:  . Record food and beverage intake in "Food and Activity Tracker" over the next week.  . E-mail completed "Food and Activity Tracker" to Lifestyle Coach next week before session 2. . Circle the foods or beverages you think are highest in fat and calories in your food tracker. . Read the labels on the food you buy, and consider using measuring cups and spoons to help you calculate the amount you eat. We will talk about measuring in more detail in the coming weeks.   Follow-Up Plan:  Attend Core Session 2 next week.   E-mail completed "Food and Activity Tracker" to Lifestyle Coach next week before class

## 2019-05-30 ENCOUNTER — Encounter: Payer: Medicare PPO | Attending: Internal Medicine | Admitting: Registered"

## 2019-05-30 DIAGNOSIS — R7303 Prediabetes: Secondary | ICD-10-CM | POA: Diagnosis not present

## 2019-06-05 ENCOUNTER — Encounter: Payer: Self-pay | Admitting: Registered"

## 2019-06-05 NOTE — Progress Notes (Signed)
On 05/30/19 patient completed Core Session 2 of Diabetes Prevention Program course virtually with [facility]. The following learning objectives were met by the patient during this class:   Learning Objectives:  Self-monitor their weight during the weeks following Session 2.   Describe the relationship between fat and calories.   Explain the reason for, and basic principles of, self-monitoring fat grams and calories.   Identify their personal fat gram goals.   Use the ?Fat and Calorie Counter to calculate the calories and fat grams of a given selection of foods.   Keep a running total of the fat grams they eat each day.   Calculate fat, calories, and serving sizes from nutrition labels.   Goals:   Weigh yourself at the same time each day, or every few days, and record your weight in your Food and Activity Tracker.  Write down everything you eat and drink in your Food and Activity Tracker.  Measure portions as much as you can, and start reading labels.   Use the ?Fat and Calorie Counter to figure out the amount of fat and calories in what you ate, and write the amount down in your Food and Activity Tracker.  Keep a running fat gram total throughout the day. Come as close to your fat gram goal as you can.   Follow-Up Plan:  Attend Core Session 3 next week.   Email completed  "Food and Activity Tracker" to Lifestyle Coach next week.   

## 2019-06-06 ENCOUNTER — Encounter (HOSPITAL_BASED_OUTPATIENT_CLINIC_OR_DEPARTMENT_OTHER): Payer: Medicare PPO | Admitting: Registered"

## 2019-06-06 DIAGNOSIS — R7303 Prediabetes: Secondary | ICD-10-CM | POA: Diagnosis not present

## 2019-06-12 ENCOUNTER — Encounter: Payer: Self-pay | Admitting: Registered"

## 2019-06-12 NOTE — Progress Notes (Signed)
On 06/06/19 patient completed Core Session 3 of Diabetes Prevention Program course virtually with [facility]. The following learning objectives were met by the patient during this class:    Learning Objectives:  Weigh and measure foods.  Estimate the fat and calorie content of common foods.  Describe three ways to eat less fat and fewer calories.  Create a plan to eat less fat for the following week.   Goals:   Track weight when weighing outside of class.   Track food and beverages eaten each day in Food and Activity Tracker and include fat grams and calories for each.   Try to stay within fat gram goal.   Complete plan for eating less high fat foods and answer related homework questions.    Follow-Up Plan:  Attend Core Session 4 next week.   Bring completed "Food and Activity Tracker" next week to be reviewed by Lifestyle Coach.   

## 2019-06-13 ENCOUNTER — Encounter (HOSPITAL_BASED_OUTPATIENT_CLINIC_OR_DEPARTMENT_OTHER): Payer: Medicare PPO | Admitting: Registered"

## 2019-06-13 ENCOUNTER — Encounter: Payer: Self-pay | Admitting: Registered"

## 2019-06-13 DIAGNOSIS — R7303 Prediabetes: Secondary | ICD-10-CM

## 2019-06-13 NOTE — Progress Notes (Signed)
On 06/13/19 patient completed Core Session 4 of Diabetes Prevention Program course virtually with [facility]. The following learning objectives were met by the patient during this class:    Learning Objectives:  Describe the MyPlate food guide and its recommendations, including how to reduce fat and calories in our diet.  Compare and contrast MyPlate guidelines with participants' eating habits.  List ways to replace high-fat and high-calorie foods with low-fat and low-calorie foods.  Explain the importance of eating plenty of whole grains, vegetables, and fruits, while staying within fat gram goals.  Explain the importance of eating foods from all groups of MyPlate and of eating a variety of foods from within each group.  Explain why a balanced diet is beneficial to health.  Goals:   Record weight taken outside of class.   Track foods and beverages eaten each day in the "Food and Activity Tracker," including calories and fat grams for each item.   Practice comparing what you eat with the recommendations of MyPlate using the "Rate Your Plate" handout.   Complete the "Rate Your Plate" handout form on at least 3 days.   Answer homework questions.   Follow-Up Plan:  Attend Core Session 5 next week.   Email completed "Food and Activity Tracker" next week to be reviewed by Lifestyle Coach.   

## 2019-06-14 ENCOUNTER — Ambulatory Visit: Payer: Medicare PPO | Attending: Internal Medicine

## 2019-06-14 DIAGNOSIS — Z23 Encounter for immunization: Secondary | ICD-10-CM

## 2019-06-14 NOTE — Progress Notes (Signed)
   Covid-19 Vaccination Clinic  Name:  Sarah Underwood    MRN: [id number] DOB: 08-Apr-1946  06/14/2019  Sarah Underwood was observed post Covid-19 immunization for 30 minutes based on pre-vaccination screening without incidence. She was provided with Vaccine Information Sheet and instruction to access the V-Safe system.   Sarah Underwood was instructed to call 911 with any severe reactions post vaccine: Marland Kitchen Difficulty breathing  . Swelling of your face and throat  . A fast heartbeat  . A bad rash all over your body  . Dizziness and weakness    Immunizations Administered    Name Date Dose VIS Date Route   Pfizer COVID-19 Vaccine 06/14/2019  3:29 PM 0.3 mL 04/04/2019 Intramuscular   Manufacturer: [place]   Lot: [id number]   [place]: [id number]

## 2019-06-20 ENCOUNTER — Encounter (HOSPITAL_BASED_OUTPATIENT_CLINIC_OR_DEPARTMENT_OTHER): Payer: Medicare PPO | Admitting: Registered"

## 2019-06-20 ENCOUNTER — Encounter: Payer: Self-pay | Admitting: Registered"

## 2019-06-20 DIAGNOSIS — R7303 Prediabetes: Secondary | ICD-10-CM

## 2019-06-20 NOTE — Progress Notes (Signed)
On 06/20/2019 patient completed Core Session 5 of Diabetes Prevention Program course virtually with [facility]. The following learning objectives were met by the patient during this class:   Learning Objectives:  Establish a physical activity goal.  Explain the importance of the physical activity goal.  Describe their current level of physical activity.  Name ways that they are already physically active.  Develop personal plans for physical activity for the next week.   Goals:   Record weight taken outside of class.   Track foods and beverages eaten each day in the "Food and Activity Tracker," including calories and fat grams for each item.   Make an Activity Plan including date, specific type of activity, and length of time you plan to be active that includes at last 60 minutes of activity for the week.   Track activity type, minutes you were active, and distance you reached each day in the "Food and Activity Tracker."   Follow-Up Plan: . Attend Core Session 6 next week.  . E-mail completed "Food and Activity Tracker" to Lifestyle Coach next week before class  

## 2019-06-27 ENCOUNTER — Encounter: Payer: Medicare PPO | Attending: Internal Medicine | Admitting: Registered"

## 2019-06-27 ENCOUNTER — Encounter: Payer: Self-pay | Admitting: Registered"

## 2019-06-27 DIAGNOSIS — R7303 Prediabetes: Secondary | ICD-10-CM | POA: Insufficient documentation

## 2019-06-27 NOTE — Progress Notes (Signed)
On 06/27/2019 patient completed Core Session 6 of Diabetes Prevention Program course virtually with [facility]. The following learning objectives were met by the patient during this class:   Learning Objectives:  Graph their daily physical activity.   Describe two ways of finding the time to be active.   Define "lifestyle activity."   Describe how to prevent injury.   Develop an activity plan for the coming week.   Goals:   Record weight taken outside of class.   Track foods and beverages eaten each day in the "Food and Activity Tracker," including calories and fat grams for each item.    Track activity type, minutes you were active, and distance you reached each day in the "Food and Activity Tracker."   Set aside one 20 to 30-minute block of time every day or find two or more periods of 10 to15 minutes each for physical activity.   Warm up, cool down, and stretch.  Make a Physical Activities Plan for the Week.   Follow-Up Plan:  Attend Core Session 7 next week.   E-mail completed "Food and Activity Tracker" to Lifestyle Coach next week before class  

## 2019-07-04 ENCOUNTER — Encounter (HOSPITAL_BASED_OUTPATIENT_CLINIC_OR_DEPARTMENT_OTHER): Payer: Medicare PPO | Admitting: Registered"

## 2019-07-04 DIAGNOSIS — R7303 Prediabetes: Secondary | ICD-10-CM | POA: Diagnosis not present

## 2019-07-07 ENCOUNTER — Encounter: Payer: Self-pay | Admitting: Registered"

## 2019-07-07 NOTE — Progress Notes (Signed)
On 07/04/19 patient completed Core Session 7 of Diabetes Prevention Program course virtually with [facility]. The following learning objectives were met by the patient during this class:   Learning Objectives:  Define calorie balance.  Explain how healthy eating and being active are related in terms of calorie balance.   Describe the relationship between calorie balance and weight loss.   Describe his or her progress as it relates to calorie balance.   Develop an activity plan for the coming week.   Goals:   Record weight taken outside of class.   Track foods and beverages eaten each day in the "Food and Activity Tracker," including calories and fat grams for each item.    Track activity type, minutes you were active, and distance you reached each day in the "Food and Activity Tracker."   Set aside one 20 to 30-minute block of time every day or find two or more periods of 10 to15 minutes each for physical activity.   Make a Physical Activities Plan for the Week.   Make active lifestyle choices all through the day   Stay at or go slightly over activity goal.   Follow-Up Plan:  Attend Core Session 8 next week.   E-mail completed "Food and Activity Tracker" to Lifestyle Coach next week before class  

## 2019-07-08 ENCOUNTER — Ambulatory Visit: Payer: Medicare PPO | Attending: Internal Medicine

## 2019-07-08 DIAGNOSIS — Z23 Encounter for immunization: Secondary | ICD-10-CM

## 2019-07-08 NOTE — Progress Notes (Signed)
   Covid-19 Vaccination Clinic  Name:  Sarah Underwood    MRN: [id number] DOB: 27-May-1945  07/08/2019  [name] was observed post Covid-19 immunization for 15 minutes without incident. She was provided with Vaccine Information Sheet and instruction to access the V-Safe system.   [name] was instructed to call 911 with any severe reactions post vaccine: Marland Kitchen Difficulty breathing  . Swelling of face and throat  . A fast heartbeat  . A bad rash all over body  . Dizziness and weakness   Immunizations Administered    Name Date Dose VIS Date Route   Pfizer COVID-19 Vaccine 07/08/2019  2:42 PM 0.3 mL 04/04/2019 Intramuscular   Manufacturer: [place]   Lot: [id number]   [place]: [id number]

## 2019-07-11 ENCOUNTER — Encounter (HOSPITAL_BASED_OUTPATIENT_CLINIC_OR_DEPARTMENT_OTHER): Payer: Medicare PPO | Admitting: Registered"

## 2019-07-11 DIAGNOSIS — R7303 Prediabetes: Secondary | ICD-10-CM | POA: Diagnosis not present

## 2019-07-16 ENCOUNTER — Encounter: Payer: Self-pay | Admitting: Registered"

## 2019-07-16 NOTE — Progress Notes (Signed)
On 07/11/19 patient completed Core Session 8 of Diabetes Prevention Program course virtually with [facility]. The following learning objectives were met by the patient during this class:   Learning Objectives:  Recognize positive and negative food and activity cues.   Change negative food and activity cues to positive cues.   Add positive cues for activity and eliminate cues for inactivity.   Develop a plan for removing one problem food cue for the coming week.   Goals:   Record weight taken outside of class.   Track foods and beverages eaten each day in the "Food and Activity Tracker," including calories and fat grams for each item.    Track activity type, minutes you were active, and distance you reached each day in the "Food and Activity Tracker."   Set aside one 20 to 30-minute block of time every day or find two or more periods of 10 to15 minutes each for physical activity.   Remove one problem food cue.   Add one positive cue for being more active.  Follow-Up Plan: . Attend Core Session 9 next week.  . Email completed "Food and Activity Tracker" next week to be reviewed by Lifestyle Coach.  

## 2019-07-18 ENCOUNTER — Encounter (HOSPITAL_BASED_OUTPATIENT_CLINIC_OR_DEPARTMENT_OTHER): Payer: Medicare PPO | Admitting: Registered"

## 2019-07-18 DIAGNOSIS — R7303 Prediabetes: Secondary | ICD-10-CM | POA: Diagnosis not present

## 2019-07-22 ENCOUNTER — Encounter: Payer: Self-pay | Admitting: Registered"

## 2019-07-22 NOTE — Progress Notes (Signed)
On 07/18/19 patient completed Core Session 9 of Diabetes Prevention Program course virtually with [facility]. The following learning objectives were met by the patient during this class:   Learning Objectives:  List and describe five steps to problem solving.   Apply the five problem solving steps to resolve a problem he or she has with eating less fat and fewer calories or being more active.   Goals:   Record weight taken outside of class.   Track foods and beverages eaten each day in the "Food and Activity Tracker," including calories and fat grams for each item.    Track activity type, minutes you were active, and distance you reached each day in the "Food and Activity Tracker."   Set aside one 20 to 30-minute block of time every day or find two or more periods of 10 to15 minutes each for physical activity.   Use problem solving action plan created during session to problem solve.   Follow-Up Plan:  Attend Core Session 10.   Email completed "Food and Activity Tracker" next week to be reviewed by Lifestyle Coach.  Email menus from favorite restaurants to next session for future discussion.   

## 2019-08-01 ENCOUNTER — Encounter: Payer: Medicare PPO | Attending: Internal Medicine | Admitting: Registered"

## 2019-08-01 ENCOUNTER — Encounter: Payer: Self-pay | Admitting: Registered"

## 2019-08-01 DIAGNOSIS — R7303 Prediabetes: Secondary | ICD-10-CM

## 2019-08-01 NOTE — Progress Notes (Signed)
On 08/01/19 patient completed Core Session 10 of Diabetes Prevention Program course virtually with [facility]. The following learning objectives were met by the patient during this class:   Learning Objectives:  List and describe the four keys for healthy eating out.   Give examples of how to apply these keys at the type of restaurants that the participants go to regularly.   Make an appropriate meal selection from a restaurant menu.   Demonstrate how to ask for a substitute item using assertive language and a polite tone of voice.    Goals:   Record weight taken outside of class.   Track foods and beverages eaten each day in the "Food and Activity Tracker," including calories and fat grams for each item.    Track activity type, minutes you were active, and distance you reached each day in the "Food and Activity Tracker."   Set aside one 20 to 30-minute block of time every day or find two or more periods of 10 to15 minutes each for physical activity.   Utilize positive action plan and complete questions on "To Do List."   Follow-Up Plan:  Attend Core Session 11.   Email completed "Food and Activity Tracker" to be reviewed by Lifestyle Coach.

## 2019-08-04 DIAGNOSIS — J3089 Other allergic rhinitis: Secondary | ICD-10-CM | POA: Diagnosis not present

## 2019-08-04 DIAGNOSIS — J301 Allergic rhinitis due to pollen: Secondary | ICD-10-CM | POA: Diagnosis not present

## 2019-08-08 ENCOUNTER — Encounter: Payer: Self-pay | Admitting: Registered"

## 2019-08-08 ENCOUNTER — Encounter (HOSPITAL_BASED_OUTPATIENT_CLINIC_OR_DEPARTMENT_OTHER): Payer: Medicare PPO | Admitting: Registered"

## 2019-08-08 DIAGNOSIS — R7303 Prediabetes: Secondary | ICD-10-CM

## 2019-08-08 NOTE — Progress Notes (Signed)
On 08/08/19 pt completed Core Session 11 of Diabetes Prevention Program course virtually with [facility]. By the end of this session patients are able to complete the following objectives:   Learning Objectives:  Give examples of negative thoughts that could prevent them from meeting their goals of losing weight and being more physically active.   Describe how to stop negative thoughts and talk back to them with positive thoughts.   Practice 1) stopping negative thoughts and 2) talking back to negative thoughts with positive ones.    Goals:   Record weight taken outside of class.   Track foods and beverages eaten each day in the "Food and Activity Tracker," including calories and fat grams for each item.    Track activity type, minutes you were active, and distance you reached each day in the "Food and Activity Tracker."   If you have any negative thoughts-write them in your Food and Activity Trackers, along with how you talked back to them. Practice stopping negative thoughts and talking back to them with positive thoughts.   Follow-Up Plan:  Attend Core Session 12 next week.   Email completed "Food and Activity Tracker" before next week to be reviewed by Lifestyle Coach.

## 2019-08-11 DIAGNOSIS — J301 Allergic rhinitis due to pollen: Secondary | ICD-10-CM | POA: Diagnosis not present

## 2019-08-11 DIAGNOSIS — J3089 Other allergic rhinitis: Secondary | ICD-10-CM | POA: Diagnosis not present

## 2019-08-11 DIAGNOSIS — J3081 Allergic rhinitis due to animal (cat) (dog) hair and dander: Secondary | ICD-10-CM | POA: Diagnosis not present

## 2019-08-15 ENCOUNTER — Encounter (HOSPITAL_BASED_OUTPATIENT_CLINIC_OR_DEPARTMENT_OTHER): Payer: Medicare PPO | Admitting: Registered"

## 2019-08-15 DIAGNOSIS — R7303 Prediabetes: Secondary | ICD-10-CM

## 2019-08-19 DIAGNOSIS — J3089 Other allergic rhinitis: Secondary | ICD-10-CM | POA: Diagnosis not present

## 2019-08-19 DIAGNOSIS — J3081 Allergic rhinitis due to animal (cat) (dog) hair and dander: Secondary | ICD-10-CM | POA: Diagnosis not present

## 2019-08-19 DIAGNOSIS — J301 Allergic rhinitis due to pollen: Secondary | ICD-10-CM | POA: Diagnosis not present

## 2019-08-22 ENCOUNTER — Encounter: Payer: Self-pay | Admitting: Registered"

## 2019-08-22 ENCOUNTER — Encounter (HOSPITAL_BASED_OUTPATIENT_CLINIC_OR_DEPARTMENT_OTHER): Payer: Medicare PPO | Admitting: Registered"

## 2019-08-22 ENCOUNTER — Other Ambulatory Visit: Payer: Self-pay

## 2019-08-22 DIAGNOSIS — J3089 Other allergic rhinitis: Secondary | ICD-10-CM | POA: Diagnosis not present

## 2019-08-22 DIAGNOSIS — R7303 Prediabetes: Secondary | ICD-10-CM

## 2019-08-22 NOTE — Progress Notes (Signed)
On 08/15/19 patient completed Core Session 12 of Diabetes Prevention Program course virtually with [facility]. By the end of this session patients are able to complete the following objectives:   Learning Objectives:  Describe their current progress toward defined goals.  Describe common causes for slipping from healthy eating or being  active.  Explain what to do to get back on their feet after a slip.  Goals:   Record weight taken outside of class.   Track foods and beverages eaten each day in the "Food and Activity Tracker," including calories and fat grams for each item.    Track activity type, minutes active, and distance reached each day in the "Food and Activity Tracker."   Try out the two action plans created during session- "Slips from Healthy Eating: Action Plan" and "Slips from Being Active: Action Plan"  Answer questions on the handout.   Follow-Up Plan:  Attend Core Session 13 next week.   Email completed "Food and Activity Tracker" before next week to be reviewed by Lifestyle Coach.

## 2019-08-23 NOTE — Progress Notes (Signed)
On 08/21/19 pt completed Core Session 13 of Diabetes Prevention Program course virtually with [facility]. By the end of this session patients are able to complete the following objectives:   Learning Objectives:  Describe ways to add interest and variety to their activity plans.  Define ?aerobic fitness.  Explain the four F.I.T.T. principles (frequency, intensity, time, and type of activity) and how they relate to aerobic fitness.   Goals:   Record weight taken outside of class.   Track foods and beverages eaten each day in the "Food and Activity Tracker," including calories and fat grams for each item.    Track activity type, minutes you were active, and distance you reached each day in the "Food and Activity Tracker."   Do your best to reach activity goal for the week.  Use one of the F.I.T.T. principles to jump start workouts.  Document activity level on the "To Do Next Week" handout.  Follow-Up Plan:  Attend Core Session 14 next week.   Email completed "Food and Activity Tracker" next week to be reviewed by Lifestyle Coach.

## 2019-08-26 DIAGNOSIS — J3081 Allergic rhinitis due to animal (cat) (dog) hair and dander: Secondary | ICD-10-CM | POA: Diagnosis not present

## 2019-08-26 DIAGNOSIS — J3089 Other allergic rhinitis: Secondary | ICD-10-CM | POA: Diagnosis not present

## 2019-08-26 DIAGNOSIS — J301 Allergic rhinitis due to pollen: Secondary | ICD-10-CM | POA: Diagnosis not present

## 2019-08-29 ENCOUNTER — Inpatient Hospital Stay: Payer: Medicare PPO | Attending: Oncology | Admitting: Oncology

## 2019-08-29 ENCOUNTER — Other Ambulatory Visit: Payer: Self-pay

## 2019-08-29 VITALS — BP 146/67 | HR 66 | Temp 97.8°F | Resp 17 | Ht 62.75 in | Wt 142.8 lb

## 2019-08-29 DIAGNOSIS — M545 Low back pain: Secondary | ICD-10-CM | POA: Diagnosis not present

## 2019-08-29 DIAGNOSIS — Z853 Personal history of malignant neoplasm of breast: Secondary | ICD-10-CM | POA: Insufficient documentation

## 2019-08-29 DIAGNOSIS — C50511 Malignant neoplasm of lower-outer quadrant of right female breast: Secondary | ICD-10-CM | POA: Diagnosis not present

## 2019-08-29 DIAGNOSIS — C44501 Unspecified malignant neoplasm of skin of breast: Secondary | ICD-10-CM | POA: Insufficient documentation

## 2019-08-29 DIAGNOSIS — G8929 Other chronic pain: Secondary | ICD-10-CM | POA: Insufficient documentation

## 2019-08-29 DIAGNOSIS — Z170 Estrogen receptor positive status [ER+]: Secondary | ICD-10-CM | POA: Diagnosis not present

## 2019-08-29 DIAGNOSIS — R7303 Prediabetes: Secondary | ICD-10-CM | POA: Insufficient documentation

## 2019-08-29 NOTE — Progress Notes (Signed)
  [place] OFFICE PROGRESS NOTE   Diagnosis: Breast cancer  INTERVAL HISTORY:   Sarah. [name] returns as scheduled.  She continues on Arimidex.  No change of the left chest wall or right breast.  She will be due for a mammogram in September. She reports intermittent back pain for the past 2 years.  Physical therapy has helped.  She has been diagnosed with "prediabetes "and reports intentional weight loss with exercise.  She has developed discomfort at the left pretibial area since she increased walking. A right mammogram on 01/13/2019 was negative. Objective:  Vital signs in last 24 hours:  Blood pressure (!) 146/67, pulse 66, temperature 97.8 F (36.6 C), temperature source Temporal, resp. rate 17, height 5' 2.75" (1.594 m), weight 142 lb 12.8 oz (64.8 kg), last menstrual period 04/25/2003, SpO2 98 %.    HEENT: Neck without mass Lymphatics: No cervical, supraclavicular, or axillary nodes GI: No hepatomegaly Vascular: No leg edema Breast: Right breast without mass.  Status post left mastectomy.  No evidence for chest wall tumor recurrence.  Radiation telangiectasias at the left chest wall. Musculoskeletal: No spine tenderness  Portacath/PICC-without erythema  Lab Results:  Lab Results  Component Value Date   WBC 17.1 (H) 09/10/2015   HGB 11.3 (L) 09/10/2015   HCT 33.0 (L) 09/10/2015   MCV 85.1 09/10/2015   PLT 305 09/10/2015    CMP  Lab Results  Component Value Date   NA 137 09/10/2015   K 3.9 09/10/2015   CL 102 09/10/2015   CO2 28 09/10/2015   GLUCOSE 125 (H) 09/10/2015   BUN 8 09/10/2015   CREATININE 0.68 09/10/2015   CALCIUM 8.8 (L) 09/10/2015   PROT 7.6 08/31/2015   ALBUMIN 4.5 08/31/2015   AST 21 08/31/2015   ALT 18 08/31/2015   ALKPHOS 74 08/31/2015   BILITOT 0.6 08/31/2015   GFRNONAA >60 09/10/2015   GFRAA >60 09/10/2015      Medications: I have reviewed the patient's current medications.   Assessment/Plan:  Sarah Underwood was  diagnosed with left-sided breast cancer 1995.  She developed a chest wall recurrence in October 2001.  She will continue indefinite Arimidex. Sarah Underwood is in clinical remission.   She will be scheduled for a mammogram and bone density can in September 2021.  Sarah. Underwood will return for an office visit in 1 year.  She will continue follow-up with orthopedics for management of chronic low back pain in the left tibia pain.  Betsy Coder, MD  08/29/2019  12:36 PM

## 2019-09-01 ENCOUNTER — Telehealth: Payer: Self-pay | Admitting: Oncology

## 2019-09-01 NOTE — Telephone Encounter (Signed)
Scheduled per 5/10 los. Pt aware of appt. Messaged RN about scan. Asked Nurse to give pt a call.

## 2019-09-02 DIAGNOSIS — J3081 Allergic rhinitis due to animal (cat) (dog) hair and dander: Secondary | ICD-10-CM | POA: Diagnosis not present

## 2019-09-02 DIAGNOSIS — J3089 Other allergic rhinitis: Secondary | ICD-10-CM | POA: Diagnosis not present

## 2019-09-02 DIAGNOSIS — J301 Allergic rhinitis due to pollen: Secondary | ICD-10-CM | POA: Diagnosis not present

## 2019-09-05 ENCOUNTER — Encounter: Payer: Medicare PPO | Attending: Internal Medicine | Admitting: Registered"

## 2019-09-05 ENCOUNTER — Encounter: Payer: Self-pay | Admitting: Registered"

## 2019-09-05 DIAGNOSIS — R7303 Prediabetes: Secondary | ICD-10-CM | POA: Insufficient documentation

## 2019-09-05 NOTE — Progress Notes (Signed)
On 09/05/19 pt completed Core Session 15 of Diabetes Prevention Program course virtually with [facility]. By the end of this session patients are able to complete the following objectives:   Learning Objectives:  Explain how to prevent stress or cope with unavoidable stress.   Describe how this program can be a source of stress.   Explain how to manage stressful situations.   Create and follow an action plan for either preventing or coping with a stressful situation.   Goals:   Record weight taken outside of class.   Track foods and beverages eaten each day in the "Food and Activity Tracker," including calories and fat grams for each item.    Track activity type, minutes you were active, and distance you reached each day in the "Food and Activity Tracker."   Do your best to reach activity goal for the week.  Follow your action plan to reduce stress.   Answer questions on handout regarding success of action plan.   Follow-Up Plan:  Attend Core Session 16 next week.   Email completed "Food and Activity Tracker" next week to be reviewed by Lifestyle Coach.

## 2019-09-09 DIAGNOSIS — J3089 Other allergic rhinitis: Secondary | ICD-10-CM | POA: Diagnosis not present

## 2019-09-09 DIAGNOSIS — J3081 Allergic rhinitis due to animal (cat) (dog) hair and dander: Secondary | ICD-10-CM | POA: Diagnosis not present

## 2019-09-09 DIAGNOSIS — J301 Allergic rhinitis due to pollen: Secondary | ICD-10-CM | POA: Diagnosis not present

## 2019-09-12 ENCOUNTER — Encounter (HOSPITAL_BASED_OUTPATIENT_CLINIC_OR_DEPARTMENT_OTHER): Payer: Medicare PPO | Admitting: Registered"

## 2019-09-12 DIAGNOSIS — R7303 Prediabetes: Secondary | ICD-10-CM | POA: Diagnosis not present

## 2019-09-15 DIAGNOSIS — Z20828 Contact with and (suspected) exposure to other viral communicable diseases: Secondary | ICD-10-CM | POA: Diagnosis not present

## 2019-09-15 DIAGNOSIS — I10 Essential (primary) hypertension: Secondary | ICD-10-CM | POA: Diagnosis not present

## 2019-09-15 DIAGNOSIS — Z03818 Encounter for observation for suspected exposure to other biological agents ruled out: Secondary | ICD-10-CM | POA: Diagnosis not present

## 2019-09-18 ENCOUNTER — Encounter: Payer: Self-pay | Admitting: Registered"

## 2019-09-18 NOTE — Progress Notes (Signed)
On 09/12/19 pt completed Core Session 16 of Diabetes Prevention Program course at [facility]. By the end of this session patients are able to complete the following objectives:   Learning Objectives:  Measure their progress toward weight and physical activity goals since Session 1.   Develop a plan for improving progress, if their goals have not yet been attained.   Describe ways to stay motivated long-term.   Goals:   Record weight taken outside of class.   Track foods and beverages eaten each day in the "Food and Activity Tracker," including calories and fat grams for each item.    Track activity type, minutes you were active, and distance you reached each day in the "Food and Activity Tracker."   Utilize action plan to help stay motivated and complete questions on "To Do List."   Follow-Up Plan:  Attend session 17.   Email completed "Food and Activity Tracker" before next session to be reviewed by Lifestyle Coach.

## 2019-09-19 ENCOUNTER — Encounter: Payer: Self-pay | Admitting: Registered"

## 2019-09-19 ENCOUNTER — Encounter: Payer: Medicare PPO | Attending: Internal Medicine | Admitting: Registered"

## 2019-09-19 DIAGNOSIS — R7303 Prediabetes: Secondary | ICD-10-CM | POA: Diagnosis not present

## 2019-09-19 NOTE — Progress Notes (Signed)
On 09/19/19 patient completed Session 17 of Diabetes Prevention Program course virtually with [facility]. By the end of this session patients are able to complete the following objectives:   Learning Objectives:  Identify how to maintain and/or continue working toward program goals for the remainder of the program.   Describe ways that food and activity tracking can assist them in maintaining/reaching program goals.   Identify progress they have made since the beginning of the program.   Goals:   Record weight taken outside of class.   Track foods and beverages eaten each day in the "Food and Activity Tracker," including calories and fat grams for each item.    Track activity type, minutes you were active, and distance you reached each day in the "Food and Activity Tracker."   Follow-Up Plan:  Attend session 18 in two weeks.   Email completed "Food and Activity Trackers" before next session to be reviewed by Lifestyle Coach.

## 2019-09-24 DIAGNOSIS — J301 Allergic rhinitis due to pollen: Secondary | ICD-10-CM | POA: Diagnosis not present

## 2019-09-24 DIAGNOSIS — J3089 Other allergic rhinitis: Secondary | ICD-10-CM | POA: Diagnosis not present

## 2019-09-24 DIAGNOSIS — J3081 Allergic rhinitis due to animal (cat) (dog) hair and dander: Secondary | ICD-10-CM | POA: Diagnosis not present

## 2019-10-02 DIAGNOSIS — J301 Allergic rhinitis due to pollen: Secondary | ICD-10-CM | POA: Diagnosis not present

## 2019-10-02 DIAGNOSIS — J3081 Allergic rhinitis due to animal (cat) (dog) hair and dander: Secondary | ICD-10-CM | POA: Diagnosis not present

## 2019-10-03 ENCOUNTER — Encounter: Payer: Medicare PPO | Attending: Internal Medicine | Admitting: Registered"

## 2019-10-03 DIAGNOSIS — R7303 Prediabetes: Secondary | ICD-10-CM | POA: Diagnosis not present

## 2019-10-09 ENCOUNTER — Encounter: Payer: Self-pay | Admitting: Registered"

## 2019-10-09 NOTE — Progress Notes (Signed)
On 10/03/19 pt completed a session of the Diabetes Prevention Program course virtually with [facility]. By the end of this session patients are able to complete the following objectives:   Learning Objectives:  Explain how glucose is used in the body and it's relationship with insulin/insulin resistance.   Identify symptoms of diabetes.   Describe lab tests used to diagnose diabetes.   Describe health complications and conditions related to diabetes.   Goals:   Record weight taken outside of class.   Track foods and beverages eaten each day in the "Food and Activity Tracker," including calories and fat grams for each item.    Track activity type, minutes you were active, and distance you reached each day in the "Food and Activity Tracker."   Follow-Up Plan:  Attend session 19 in two weeks.   Email completed "Food and Activity Trackers" before next session to be reviewed by Lifestyle Coach.

## 2019-10-15 DIAGNOSIS — M79671 Pain in right foot: Secondary | ICD-10-CM | POA: Diagnosis not present

## 2019-10-17 ENCOUNTER — Encounter (HOSPITAL_BASED_OUTPATIENT_CLINIC_OR_DEPARTMENT_OTHER): Payer: Medicare PPO | Admitting: Registered"

## 2019-10-17 ENCOUNTER — Encounter: Payer: Self-pay | Admitting: Registered"

## 2019-10-17 DIAGNOSIS — R7303 Prediabetes: Secondary | ICD-10-CM

## 2019-10-17 NOTE — Progress Notes (Signed)
On 10/17/19 pt completed a post core session of the Diabetes Prevention Program course virtually with [facility]. By the end of this session patients are able to complete the following objectives:   Learning Objectives:  Describe the differences between unsaturated, saturated, and trans fat on heart health.   List dietary sources of unsaturated, saturated, and trans fats.  Explain ways to reduce intake of saturated fat and replace them with heart healthy fats.  Goals:   Record weight taken outside of class.   Track foods and beverages eaten each day in the "Food and Activity Tracker," including calories and fat grams for each item.    Track activity type, minutes you were active, and distance you reached each day in the "Food and Activity Tracker."   Follow-Up Plan:  Attend next session.   Email completed "Food and Activity Trackers" before next session to be reviewed by Lifestyle Coach.

## 2019-10-27 ENCOUNTER — Other Ambulatory Visit: Payer: Self-pay | Admitting: Internal Medicine

## 2019-10-31 ENCOUNTER — Encounter: Payer: Self-pay | Admitting: Registered"

## 2019-10-31 ENCOUNTER — Encounter: Payer: Medicare PPO | Attending: Internal Medicine | Admitting: Registered"

## 2019-10-31 DIAGNOSIS — Z0000 Encounter for general adult medical examination without abnormal findings: Secondary | ICD-10-CM | POA: Diagnosis not present

## 2019-10-31 DIAGNOSIS — R7303 Prediabetes: Secondary | ICD-10-CM | POA: Insufficient documentation

## 2019-10-31 DIAGNOSIS — R7301 Impaired fasting glucose: Secondary | ICD-10-CM | POA: Diagnosis not present

## 2019-10-31 DIAGNOSIS — M859 Disorder of bone density and structure, unspecified: Secondary | ICD-10-CM | POA: Diagnosis not present

## 2019-10-31 DIAGNOSIS — I10 Essential (primary) hypertension: Secondary | ICD-10-CM | POA: Diagnosis not present

## 2019-10-31 NOTE — Progress Notes (Signed)
On 10/31/19 pt completed a post core session of the Diabetes Prevention Program course virtually with [facility]. By the end of this session patients are able to complete the following objectives:   Learning Objectives:  Describe the importance of having regular meals each day and how skipping meals can negatively affect food choices and weight.   Plan out balanced meals and snacks.  List ways to avoid unplanned snacking.   Goals:   Record weight taken outside of class.   Track foods and beverages eaten each day in the "Food and Activity Tracker," including calories and fat grams for each item.    Track activity type, minutes you were active, and distance you reached each day in the "Food and Activity Tracker."   Follow-Up Plan:  Attend next session.   Email completed "Food and Activity Trackers" before next session to be reviewed by Lifestyle Coach.

## 2019-11-07 DIAGNOSIS — R413 Other amnesia: Secondary | ICD-10-CM | POA: Diagnosis not present

## 2019-11-07 DIAGNOSIS — G609 Hereditary and idiopathic neuropathy, unspecified: Secondary | ICD-10-CM | POA: Diagnosis not present

## 2019-11-07 DIAGNOSIS — Z85828 Personal history of other malignant neoplasm of skin: Secondary | ICD-10-CM | POA: Diagnosis not present

## 2019-11-07 DIAGNOSIS — Z1331 Encounter for screening for depression: Secondary | ICD-10-CM | POA: Diagnosis not present

## 2019-11-07 DIAGNOSIS — Z0000 Encounter for general adult medical examination without abnormal findings: Secondary | ICD-10-CM | POA: Diagnosis not present

## 2019-11-07 DIAGNOSIS — I10 Essential (primary) hypertension: Secondary | ICD-10-CM | POA: Diagnosis not present

## 2019-11-07 DIAGNOSIS — K219 Gastro-esophageal reflux disease without esophagitis: Secondary | ICD-10-CM | POA: Diagnosis not present

## 2019-11-07 DIAGNOSIS — M8580 Other specified disorders of bone density and structure, unspecified site: Secondary | ICD-10-CM | POA: Diagnosis not present

## 2019-11-07 DIAGNOSIS — C50919 Malignant neoplasm of unspecified site of unspecified female breast: Secondary | ICD-10-CM | POA: Diagnosis not present

## 2019-11-07 DIAGNOSIS — E0500 Thyrotoxicosis with diffuse goiter without thyrotoxic crisis or storm: Secondary | ICD-10-CM | POA: Diagnosis not present

## 2019-11-07 DIAGNOSIS — R82998 Other abnormal findings in urine: Secondary | ICD-10-CM | POA: Diagnosis not present

## 2019-11-07 DIAGNOSIS — Z1212 Encounter for screening for malignant neoplasm of rectum: Secondary | ICD-10-CM | POA: Diagnosis not present

## 2019-11-07 LAB — IFOBT (OCCULT BLOOD): IFOBT: NEGATIVE

## 2019-11-10 ENCOUNTER — Encounter: Payer: Self-pay | Admitting: Neurology

## 2019-11-12 DIAGNOSIS — M79671 Pain in right foot: Secondary | ICD-10-CM | POA: Diagnosis not present

## 2019-11-12 DIAGNOSIS — M2041 Other hammer toe(s) (acquired), right foot: Secondary | ICD-10-CM | POA: Diagnosis not present

## 2019-11-14 ENCOUNTER — Encounter (HOSPITAL_BASED_OUTPATIENT_CLINIC_OR_DEPARTMENT_OTHER): Payer: Medicare PPO | Admitting: Registered"

## 2019-11-14 ENCOUNTER — Encounter: Payer: Self-pay | Admitting: Registered"

## 2019-11-14 DIAGNOSIS — R7303 Prediabetes: Secondary | ICD-10-CM | POA: Diagnosis not present

## 2019-11-14 NOTE — Progress Notes (Signed)
On 11/14/19 pt completed a session of the Diabetes Prevention Program course virtually with [facility]. By the end of this session patients are able to complete the following objectives:   Learning Objectives:  Describe the difference between a lapse and a relapse.  List steps to prevent a lapse from becoming a relapse.   Identify situations that increase risk of having a lapse.   Make a plan to help prevent lapses and recover after a lapse has occurred.   Goals:   Record weight taken outside of class.   Track foods and beverages eaten each day in the "Food and Activity Tracker," including calories and fat grams for each item.    Track activity type, minutes you were active, and distance you reached each day in the "Food and Activity Tracker."   Follow-Up Plan:  Attend next session  Email completed "Food and Activity Trackers" before next session to be reviewed by Lifestyle Coach.

## 2019-11-20 NOTE — Progress Notes (Signed)
74 y.o. G73P1001 Divorced White or Caucasian female here for annual exam.  Doing well.  She's had some bleeding, not sure if it was vaginal.  She thinks it was rectal bleeding.  This lasted for a few days.  Last colonoscopy 2017.  She is scheduled for December.  She just did a stool sample the end of June.  Hasn't seen test results.    Has hairline fracture on right foot.  Has MRI scheduled for Tuesday.  BMD and MMG is scheduled for September.    PCP:  Dr. Jackelyn Poling.  Last appt was early July.  Blood work was done the week prior.  Reports blood work was normal.    Patient's last menstrual period was 04/25/2003.          Sexually active: No.  The current method of family planning is post menopausal status.    Exercising: Yes.    ymca Smoker:  no  Health Maintenance: Pap:  06-06-16 neg, 10-04-2018 neg History of abnormal Pap:  yes MMG:  01-13-2019 category c density birads 1:neg Colonoscopy:  2017 f/u 39yr BMD:   01-07-18 osteopenia TDaP:  2011 unsure if updated Pneumonia vaccine(s):  2014 Shingrix:   Had done Hep C testing: pcp Screening Labs: done in July   reports that she has never smoked. She has never used smokeless tobacco. She reports previous alcohol use. She reports that she does not use drugs.  Past Medical History:  Diagnosis Date  . Arthritis   . Asthma   . Basal cell carcinoma 1994   right side of node  . Benign positional vertigo    with rolling over   . Breast cancer ([facility] 1995 & [facility]   left-left mastectomy, left chest wall recurrence, radiation x 7 weeks  . Complication of anesthesia    slow waking up  . Dysrhythmia   . Family history of adverse reaction to anesthesia    pts mother had postop vomiting   . GERD (gastroesophageal reflux disease)   . Graves disease 2007   thyroid nodule  . Hard of hearing   . Heart murmur   . History of radiation therapy   . Hypertension   . Nocturia   . Numbness and tingling    bilat more per right hand  . Ovarian mass     small solid nodule 8x763m Right ovary - stable since 2000  . PONV (postoperative nausea and vomiting)    pt states occurred with pain meds questions darvocet   . Seasonal allergies   . Stress fracture    of right foot  . Vertigo   . Wears glasses     Past Surgical History:  Procedure Laterality Date  . APPENDECTOMY    . basal cell removal  7/94   right nose  . BREAST BIOPSY Right 1987   benign fibroadenoma  . breast cancer recurrence Left 01/2000   at site of left chest wall after previous mastectomy with removal and treatment with radiaton.  . Marland KitchenARPAL TUNNEL RELEASE Right 05/08/2013   Procedure: RIGHT CARPAL TUNNEL RELEASE;  Surgeon: RoCammie Sickle MD;  [facility] Service: Orthopedics;  Laterality: Right;  . CATARACT EXTRACTION     03/20/14 left eye, 12/15 right eye  . COLPOSCOPY W/ BIOPSY / CURETTAGE  03/12/12   benign, but + HR HPV  . EYE SURGERY     bilat cataract surgery   . MASTECTOMY Left 3/95   Stage IIb 0/14 LN ER/ PR +  with mastectomy - Dr. Mendel Ryder  . MOHS SURGERY     right nare  . [place]   lt  . TONSILLECTOMY AND ADENOIDECTOMY  1950  . TOTAL HIP ARTHROPLASTY Right 09/08/2015   Procedure: TOTAL HIP ARTHROPLASTY ANTERIOR APPROACH;  Surgeon: Gaynelle Arabian, MD;  Location: [facility];  Service: Orthopedics;  Laterality: Right;    Current Outpatient Medications  Medication Sig Dispense Refill  . acetaminophen (TYLENOL) 650 MG CR tablet Take 1,300 mg by mouth as needed.     Marland Kitchen albuterol (PROVENTIL HFA;VENTOLIN HFA) 108 (90 Base) MCG/ACT inhaler Inhale 2 puffs into the lungs every 4 (four) hours as needed for wheezing. 1 Inhaler 3  . amLODipine (NORVASC) 2.5 MG tablet Take 1 tablet by mouth daily.    Marland Kitchen anastrozole (ARIMIDEX) 1 MG tablet Take 1 tablet (1 mg total) by mouth daily. 90 tablet 3  . Artificial Tear Solution (TEARS NATURALE OP) Place 2 drops into both eyes daily as needed (For dry eyes.).    Marland Kitchen calcium-vitamin D 250-100  MG-UNIT tablet Take 1 tablet by mouth 2 (two) times daily.    . Cetirizine HCl (ZYRTEC ALLERGY PO) Take by mouth as needed.     . cimetidine (TAGAMET) 200 MG tablet Take 200 mg by mouth as needed (if taking starts with 2 tablets daily and weans down).    Marland Kitchen FLOVENT HFA 110 MCG/ACT inhaler INHALE 2 PUFFS INTO THE LUNGS TWICE A DAY 36 Inhaler 1  . ibuprofen (ADVIL,MOTRIN) 600 MG tablet Take 600 mg by mouth every 6 (six) hours as needed.    Marland Kitchen ketotifen (ALAWAY) 0.025 % ophthalmic solution Place 1 drop into both eyes daily.    Marland Kitchen MAGNESIUM CITRATE PO Take 1 tablet by mouth 2 (two) times daily.    . Menaquinone-7 (VITAMIN K2) 100 MCG CAPS Take by mouth.    . Multiple Vitamin (MULTIVITAMIN) tablet Take 1 tablet by mouth daily.    Marland Kitchen olmesartan (BENICAR) 20 MG tablet Take 20 mg by mouth daily.  1  . VITAMIN D PO Take 4,000 Int'l Units by mouth.    . NON FORMULARY Allergy Injections once a month (Patient not taking: Reported on 11/21/2019)     No current facility-administered medications for this visit.    Family History  Problem Relation Age of Onset  . Breast cancer Mother 54       deceased 18  . Osteoporosis Mother   . Hypertension Mother   . Hypertension Father   . Heart attack Father   . Breast cancer Sister 17       bilateral; recurrent 12 years late  . Diabetes Brother   . Lung cancer Maternal Grandfather        deceased 73  . Cancer Paternal Grandmother        duodenal; deceased 60    Review of Systems  Constitutional: Negative.   HENT: Negative.   Eyes: Negative.   Respiratory: Negative.   Cardiovascular: Negative.   Gastrointestinal: Negative.   Endocrine: Negative.   Genitourinary: Negative.   Musculoskeletal: Negative.   Skin: Negative.   Allergic/Immunologic: Negative.   Neurological: Negative.   Hematological: Negative.   Psychiatric/Behavioral: Negative.     Exam:   BP (!) 120/64   Pulse 68   Resp 16   Ht 5' 2.75" (1.594 m)   Wt 144 lb (65.3 kg)   LMP  04/25/2003   BMI 25.71 kg/m   Height: 5' 2.75" (159.4 cm)  General appearance: alert, cooperative  and appears stated age Head: Normocephalic, without obvious abnormality, atraumatic Neck: no adenopathy, supple, symmetrical, trachea midline and thyroid normal to inspection and palpation Lungs: clear to auscultation bilaterally Breasts: normal appearance, no masses or tenderness Heart: regular rate and rhythm Abdomen: soft, non-tender; bowel sounds normal; no masses,  no organomegaly Extremities: extremities normal, atraumatic, no cyanosis or edema Skin: Skin color, texture, turgor normal. No rashes or lesions Lymph nodes: Cervical, supraclavicular, and axillary nodes normal. No abnormal inguinal nodes palpated Neurologic: Grossly normal   Pelvic: External genitalia:  no lesions              Urethra:  normal appearing urethra with no masses, tenderness or lesions              Bartholins and Skenes: normal                 Vagina: normal appearing vagina with normal color and discharge, no lesions              Cervix: no lesions              Pap taken: No. Bimanual Exam:  Uterus:  normal size, contour, position, consistency, mobility, non-tender              Adnexa: normal adnexa and no mass, fullness, tenderness               Rectovaginal: Confirms               Anus:  normal sphincter tone, no lesions  Chaperone, Terence Lux, CMA, was present for exam.  A:  Well Woman with normal exam PMP, no HRT S/p left breast cancer 3/96, 1/02.  S/p mastectomy, radiation and Arimidex after recurrent.  BRCA testing was negative. Atrophic vaginal changes H/o solid adnexa mass, last PUS 2018 and no mass was noted but imaging was poor quality.  Pt does not tolerated vaginal exams well. Rectal bleeding  P:   Mammogram guidelines reviewed.   pap smear neg 2020 Colonoscopy is scheduled for December Ca-125 will be obtained today.  Plan PUS this year. She will check on tdap with Dr. Jackelyn Poling to  see if is needed Return for PUS.  Order placed.   return annually or prn

## 2019-11-21 ENCOUNTER — Encounter: Payer: Self-pay | Admitting: Obstetrics & Gynecology

## 2019-11-21 ENCOUNTER — Ambulatory Visit (INDEPENDENT_AMBULATORY_CARE_PROVIDER_SITE_OTHER): Payer: Medicare PPO | Admitting: Obstetrics & Gynecology

## 2019-11-21 ENCOUNTER — Other Ambulatory Visit: Payer: Self-pay

## 2019-11-21 VITALS — BP 120/64 | HR 68 | Resp 16 | Ht 62.75 in | Wt 144.0 lb

## 2019-11-21 DIAGNOSIS — N9489 Other specified conditions associated with female genital organs and menstrual cycle: Secondary | ICD-10-CM

## 2019-11-21 DIAGNOSIS — Z01419 Encounter for gynecological examination (general) (routine) without abnormal findings: Secondary | ICD-10-CM

## 2019-11-22 LAB — CA 125: Cancer Antigen (CA) 125: 5.1 U/mL (ref 0.0–38.1)

## 2019-11-25 DIAGNOSIS — M79671 Pain in right foot: Secondary | ICD-10-CM | POA: Diagnosis not present

## 2019-11-26 ENCOUNTER — Other Ambulatory Visit: Payer: Self-pay | Admitting: Oncology

## 2019-11-26 ENCOUNTER — Telehealth: Payer: Self-pay | Admitting: Obstetrics & Gynecology

## 2019-11-26 DIAGNOSIS — C50511 Malignant neoplasm of lower-outer quadrant of right female breast: Secondary | ICD-10-CM

## 2019-11-26 DIAGNOSIS — Z170 Estrogen receptor positive status [ER+]: Secondary | ICD-10-CM

## 2019-11-26 NOTE — Telephone Encounter (Signed)
Spoke with patient regarding benefits for recommended ultrasound. Patient acknowledges understanding of information presented. Patient is aware of cancellation policy. Patient scheduled appointment for 12/11/2019 at 0100PM with M. Edwinna Areola, MD. Encounter closed.

## 2019-12-05 ENCOUNTER — Encounter: Payer: Medicare PPO | Attending: Internal Medicine | Admitting: Registered"

## 2019-12-05 DIAGNOSIS — R7303 Prediabetes: Secondary | ICD-10-CM | POA: Insufficient documentation

## 2019-12-09 DIAGNOSIS — Z03818 Encounter for observation for suspected exposure to other biological agents ruled out: Secondary | ICD-10-CM | POA: Diagnosis not present

## 2019-12-09 DIAGNOSIS — Z20822 Contact with and (suspected) exposure to covid-19: Secondary | ICD-10-CM | POA: Diagnosis not present

## 2019-12-11 ENCOUNTER — Ambulatory Visit (INDEPENDENT_AMBULATORY_CARE_PROVIDER_SITE_OTHER): Payer: Medicare PPO

## 2019-12-11 ENCOUNTER — Ambulatory Visit: Payer: Medicare PPO | Admitting: Obstetrics & Gynecology

## 2019-12-11 ENCOUNTER — Encounter: Payer: Self-pay | Admitting: Obstetrics & Gynecology

## 2019-12-11 ENCOUNTER — Other Ambulatory Visit: Payer: Self-pay | Admitting: Obstetrics & Gynecology

## 2019-12-11 ENCOUNTER — Encounter: Payer: Self-pay | Admitting: Registered"

## 2019-12-11 ENCOUNTER — Other Ambulatory Visit: Payer: Self-pay

## 2019-12-11 VITALS — BP 140/70 | HR 68 | Resp 16 | Wt 144.0 lb

## 2019-12-11 DIAGNOSIS — N9489 Other specified conditions associated with female genital organs and menstrual cycle: Secondary | ICD-10-CM

## 2019-12-11 NOTE — Progress Notes (Signed)
On 12/05/19 pt completed a post core session of the Diabetes Prevention Program course virtually with [facility]. By the end of this session patients are able to complete the following objectives:   Learning Objectives:  Define fiber and describe the difference between insoluble and soluble fiber   List foods that are good sources of fiber  Explain the health benefits of fiber   Describe ways to increase volume of meals and snacks while staying within fat goal.   Goals:   Record weight taken outside of class.   Track foods and beverages eaten each day in the "Food and Activity Tracker," including calories and fat grams for each item.    Track activity type, minutes you were active, and distance you reached each day in the "Food and Activity Tracker."   Follow-Up Plan:  Attend next session.   Email completed "Food and Activity Trackers" before next session to be reviewed by Lifestyle Coach.

## 2019-12-11 NOTE — Progress Notes (Signed)
74 y.o. G29P1001 Divorced White or Caucasian female here for pelvic ultrasound due to h/o solid adnexal mass that resolved with imaging in 2018 but imaging was not good quality.  Pt cannot tolerate transvaginal ultrasounds due to vaginal atrophy.  Has not had any pelvic pain or changes on physical exam but we discussed at her last gyn exam considering repeating her pelvic ultrasound.  She was in agreement.  Transabdominal imaging has been planned for today..  Patient's last menstrual period was 04/25/2003.  Contraception: PMP  Findings:  UTERUS: 5.5 x 2.7 x 2.0cm EMS: 2.53mm ADNEXA: Left ovary: 1.7 x 1.0 x 1.1cm       Right ovary: 2.0 x 1.4 x 1.1cm CUL DE SAC: no free fluid  Discussion: Ultrasonographer supervised.  Images reviewed with pt.  Ovaries both appear atrophic without any abnormal findings.  Will not plan future ultrasounds unless there is a new change in symptoms or with exam.  Pt in agreement with this.  Assessment: h/o small solid adnexal mass that resolved with prior imaging and again today with much better quality imaging does not show any adnexal mass  Plan:  Return for any new issue/concern.  Will not plan future ultrasound unless new problem or change in physical exam occurs.

## 2019-12-15 DIAGNOSIS — M84375A Stress fracture, left foot, initial encounter for fracture: Secondary | ICD-10-CM | POA: Insufficient documentation

## 2019-12-15 DIAGNOSIS — M84375D Stress fracture, left foot, subsequent encounter for fracture with routine healing: Secondary | ICD-10-CM | POA: Diagnosis not present

## 2019-12-15 HISTORY — DX: Stress fracture, left foot, initial encounter for fracture: M84.375A

## 2019-12-17 ENCOUNTER — Other Ambulatory Visit: Payer: Self-pay | Admitting: Oncology

## 2019-12-17 DIAGNOSIS — C50511 Malignant neoplasm of lower-outer quadrant of right female breast: Secondary | ICD-10-CM

## 2019-12-17 DIAGNOSIS — Z170 Estrogen receptor positive status [ER+]: Secondary | ICD-10-CM

## 2019-12-19 ENCOUNTER — Encounter: Payer: Self-pay | Admitting: Registered"

## 2019-12-19 ENCOUNTER — Encounter (HOSPITAL_BASED_OUTPATIENT_CLINIC_OR_DEPARTMENT_OTHER): Payer: Medicare PPO | Admitting: Registered"

## 2019-12-19 DIAGNOSIS — R7303 Prediabetes: Secondary | ICD-10-CM

## 2019-12-19 NOTE — Progress Notes (Addendum)
On 12/19/19 patient completed a post core session of the Diabetes Prevention Program course virtually with [facility]. By the end of this session patients are able to complete the following objectives:   Virtual Visit via Video Note  I connected with Beacher May on 12/19/19 at 11:30 AM EDT by a video enabled application and verified that I am speaking with the correct person using two identifiers.   Location: Patient: Patient's home.  Provider: Provider's office at [place].  Learning Objectives:  Describe how to incorporate more fruits and vegetables into meals.  List criteria for selecting good quality fruits and vegetables at the store.   Define mindful eating.  List the benefits of eating mindfully.   Goals:   Record weight taken outside of class.   Track foods and beverages eaten each day in the "Food and Activity Tracker," including calories and fat grams for each item.    Track activity type, minutes you were active, and distance you reached each day in the "Food and Activity Tracker."   Follow-Up Plan:  Attend next session.   Email completed "Food and Activity Trackers" before next session to be reviewed by Lifestyle Coach.

## 2019-12-22 DIAGNOSIS — Z20822 Contact with and (suspected) exposure to covid-19: Secondary | ICD-10-CM | POA: Diagnosis not present

## 2019-12-22 DIAGNOSIS — Z03818 Encounter for observation for suspected exposure to other biological agents ruled out: Secondary | ICD-10-CM | POA: Diagnosis not present

## 2020-01-02 ENCOUNTER — Encounter: Payer: Medicare PPO | Attending: Internal Medicine | Admitting: Registered"

## 2020-01-02 DIAGNOSIS — R7303 Prediabetes: Secondary | ICD-10-CM | POA: Diagnosis not present

## 2020-01-04 ENCOUNTER — Encounter: Payer: Self-pay | Admitting: Registered"

## 2020-01-04 NOTE — Progress Notes (Signed)
On 01/02/20 patient completed a post core session of the Diabetes Prevention Program course virtually with [facility]. By the end of this session patients are able to complete the following objectives:   Learning Objectives:  Identify different types of stressors  List effects of stress on health and healthy lifestyle choices  Discuss how stress affects lifestyle choices   Name healthy ways to manage and avoid stressors   Goals:   Record weight taken outside of class.   Track foods and beverages eaten each day in the "Food and Activity Tracker," including calories and fat grams for each item.    Track activity type, minutes you were active, and distance you reached each day in the "Food and Activity Tracker."   Follow-Up Plan:  Attend next session.   Email completed "Food and Activity Trackers" before next session to be reviewed by Lifestyle Coach.

## 2020-01-05 DIAGNOSIS — J3089 Other allergic rhinitis: Secondary | ICD-10-CM | POA: Diagnosis not present

## 2020-01-05 DIAGNOSIS — J301 Allergic rhinitis due to pollen: Secondary | ICD-10-CM | POA: Diagnosis not present

## 2020-01-05 DIAGNOSIS — J4530 Mild persistent asthma, uncomplicated: Secondary | ICD-10-CM | POA: Diagnosis not present

## 2020-01-05 DIAGNOSIS — J3081 Allergic rhinitis due to animal (cat) (dog) hair and dander: Secondary | ICD-10-CM | POA: Diagnosis not present

## 2020-01-07 DIAGNOSIS — Z20822 Contact with and (suspected) exposure to covid-19: Secondary | ICD-10-CM | POA: Diagnosis not present

## 2020-01-07 DIAGNOSIS — Z03818 Encounter for observation for suspected exposure to other biological agents ruled out: Secondary | ICD-10-CM | POA: Diagnosis not present

## 2020-01-14 DIAGNOSIS — D3131 Benign neoplasm of right choroid: Secondary | ICD-10-CM | POA: Diagnosis not present

## 2020-01-14 DIAGNOSIS — Z961 Presence of intraocular lens: Secondary | ICD-10-CM | POA: Diagnosis not present

## 2020-01-14 DIAGNOSIS — H04123 Dry eye syndrome of bilateral lacrimal glands: Secondary | ICD-10-CM | POA: Diagnosis not present

## 2020-01-15 DIAGNOSIS — Z1231 Encounter for screening mammogram for malignant neoplasm of breast: Secondary | ICD-10-CM | POA: Diagnosis not present

## 2020-01-15 DIAGNOSIS — M85852 Other specified disorders of bone density and structure, left thigh: Secondary | ICD-10-CM | POA: Diagnosis not present

## 2020-01-19 DIAGNOSIS — M79671 Pain in right foot: Secondary | ICD-10-CM | POA: Diagnosis not present

## 2020-01-19 DIAGNOSIS — M84375D Stress fracture, left foot, subsequent encounter for fracture with routine healing: Secondary | ICD-10-CM | POA: Diagnosis not present

## 2020-01-30 ENCOUNTER — Encounter: Payer: Medicare PPO | Attending: Internal Medicine | Admitting: Registered"

## 2020-01-30 DIAGNOSIS — R7303 Prediabetes: Secondary | ICD-10-CM | POA: Insufficient documentation

## 2020-02-04 ENCOUNTER — Encounter: Payer: Self-pay | Admitting: Registered"

## 2020-02-04 NOTE — Progress Notes (Signed)
On 02/04/20 patient completed a post core session of the Diabetes Prevention Program course virtually with [facility]. By the end of this session patients are able to complete the following objectives:   Virtual Visit via Video Note  I connected with Lindaann Slough by a video enabled application and verified that I am speaking with the correct person.  Location: Patient: Home.  Provider: [place].   Learning Objectives:  List ways to make recipes healthier.   List lower-fat and lower-calorie substitutions for common ingredients.   Identify low-fat cooking methods.   Describe how to choose a cookbook that works best for their needs.   Goals:   Record weight taken outside of class.   Track foods and beverages eaten each day in the "Food and Activity Tracker," including calories and fat grams for each item.    Track activity type, minutes you were active, and distance you reached each day in the "Food and Activity Tracker."   Follow-Up Plan:  Attend next session.   Email completed "Food and Activity Trackers" before next session to be reviewed by Lifestyle Coach.

## 2020-02-13 ENCOUNTER — Encounter: Payer: Self-pay | Admitting: Neurology

## 2020-02-13 ENCOUNTER — Other Ambulatory Visit: Payer: Self-pay

## 2020-02-13 ENCOUNTER — Ambulatory Visit: Payer: Medicare PPO | Admitting: Neurology

## 2020-02-13 VITALS — BP 126/72 | HR 70 | Ht 63.0 in | Wt 144.0 lb

## 2020-02-13 DIAGNOSIS — R413 Other amnesia: Secondary | ICD-10-CM

## 2020-02-13 DIAGNOSIS — R4789 Other speech disturbances: Secondary | ICD-10-CM | POA: Diagnosis not present

## 2020-02-13 NOTE — Patient Instructions (Addendum)
1. Schedule MRI brain with and without contrast  2. Schedule Neurocognitive testing  3. Follow-up in 6 months, call for any changes   RECOMMENDATIONS FOR ALL PATIENTS WITH MEMORY PROBLEMS: 1. Continue to exercise (Recommend 30 minutes of walking everyday, or 3 hours every week) 2. Increase social interactions - continue going to [place] and enjoy social gatherings with friends and family 3. Eat healthy, avoid fried foods and eat more fruits and vegetables 4. Maintain adequate blood pressure, blood sugar, and blood cholesterol level. Reducing the risk of stroke and cardiovascular disease also helps promoting better memory. 5. Avoid stressful situations. Live a simple life and avoid aggravations. Organize your time and prepare for the next day in anticipation. 6. Sleep well, avoid any interruptions of sleep and avoid any distractions in the bedroom that may interfere with adequate sleep quality 7. Avoid sugar, avoid sweets as there is a strong link between excessive sugar intake, diabetes, and cognitive impairment We discussed the Mediterranean diet, which has been shown to help patients reduce the risk of progressive memory disorders and reduces cardiovascular risk. This includes eating fish, eat fruits and green leafy vegetables, nuts like almonds and hazelnuts, walnuts, and also use olive oil. Avoid fast foods and fried foods as much as possible. Avoid sweets and sugar as sugar use has been linked to worsening of memory function.
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
## 2020-02-16 DIAGNOSIS — Z20822 Contact with and (suspected) exposure to covid-19: Secondary | ICD-10-CM | POA: Diagnosis not present

## 2020-02-16 DIAGNOSIS — Z03818 Encounter for observation for suspected exposure to other biological agents ruled out: Secondary | ICD-10-CM | POA: Diagnosis not present

## 2020-02-26 DIAGNOSIS — D1801 Hemangioma of skin and subcutaneous tissue: Secondary | ICD-10-CM | POA: Diagnosis not present

## 2020-02-26 DIAGNOSIS — B351 Tinea unguium: Secondary | ICD-10-CM | POA: Diagnosis not present

## 2020-02-26 DIAGNOSIS — D2261 Melanocytic nevi of right upper limb, including shoulder: Secondary | ICD-10-CM | POA: Diagnosis not present

## 2020-02-26 DIAGNOSIS — L918 Other hypertrophic disorders of the skin: Secondary | ICD-10-CM | POA: Diagnosis not present

## 2020-02-26 DIAGNOSIS — L821 Other seborrheic keratosis: Secondary | ICD-10-CM | POA: Diagnosis not present

## 2020-02-26 DIAGNOSIS — L814 Other melanin hyperpigmentation: Secondary | ICD-10-CM | POA: Diagnosis not present

## 2020-02-26 DIAGNOSIS — D2262 Melanocytic nevi of left upper limb, including shoulder: Secondary | ICD-10-CM | POA: Diagnosis not present

## 2020-03-02 ENCOUNTER — Encounter: Payer: Self-pay | Admitting: Oncology

## 2020-03-03 DIAGNOSIS — Z20822 Contact with and (suspected) exposure to covid-19: Secondary | ICD-10-CM | POA: Diagnosis not present

## 2020-03-05 ENCOUNTER — Encounter: Payer: Medicare PPO | Attending: Internal Medicine | Admitting: Registered"

## 2020-03-05 DIAGNOSIS — R7303 Prediabetes: Secondary | ICD-10-CM | POA: Diagnosis not present

## 2020-03-08 ENCOUNTER — Encounter: Payer: Self-pay | Admitting: Registered"

## 2020-03-08 NOTE — Progress Notes (Signed)
On 03/05/20 patient completed a post core session of the Diabetes Prevention Program course virtually with [facility]. By the end of this session patients are able to complete the following objectives:   Virtual Visit via Video Note  I connected with Sarah Underwood by a video enabled application and verified that I am speaking with the correct person.   Location: Patient: Pt's home.  Provider: Office.   Learning Objectives:  List 9 ways to to stay on track during special events/occasions.  Create a plan to avoid a food problem at an upcoming special event/occasion  Describe ways to make time for healthy behaviors during special events/occasions   Goals:   Record weight taken outside of class.   Track foods and beverages eaten each day in the "Food and Activity Tracker," including calories and fat grams for each item.    Track activity type, minutes you were active, and distance you reached each day in the "Food and Activity Tracker."   Follow-Up Plan: . Attend next session.  . Email completed "Food and Activity Trackers" before next session to be reviewed by Lifestyle Coach.

## 2020-03-10 ENCOUNTER — Other Ambulatory Visit: Payer: Self-pay

## 2020-03-10 ENCOUNTER — Ambulatory Visit
Admission: RE | Admit: 2020-03-10 | Discharge: 2020-03-10 | Disposition: A | Discharge status: Home / Self Care | Payer: Medicare PPO | Source: Ambulatory Visit | Attending: Neurology | Admitting: Neurology

## 2020-03-10 DIAGNOSIS — R4789 Other speech disturbances: Secondary | ICD-10-CM

## 2020-03-10 DIAGNOSIS — R4701 Aphasia: Secondary | ICD-10-CM | POA: Diagnosis not present

## 2020-03-10 DIAGNOSIS — R413 Other amnesia: Secondary | ICD-10-CM | POA: Diagnosis not present

## 2020-03-10 DIAGNOSIS — G319 Degenerative disease of nervous system, unspecified: Secondary | ICD-10-CM | POA: Diagnosis not present

## 2020-03-10 DIAGNOSIS — G9389 Other specified disorders of brain: Secondary | ICD-10-CM | POA: Diagnosis not present

## 2020-03-10 IMAGING — MR MR HEAD WO/W CM
12 series · 48 of 48 positions shown · IV contrast (multihance)
Comparison: None.

CLINICAL DATA: Memory loss.  Periodically aphasia.

EXAM:
MRI HEAD WITHOUT AND WITH CONTRAST
TECHNIQUE: Multiplanar, multiecho pulse sequences of the brain and surrounding
structures were obtained without and with intravenous contrast.
CONTRAST:  13mL MULTIHANCE GADOBENATE DIMEGLUMINE 529 MG/ML IV SOLN

[Series 2: t1_se_sag · sagittal · 5.0mm · 0.45mm/px · 1 of 21 slices shown]
[im 1/21]
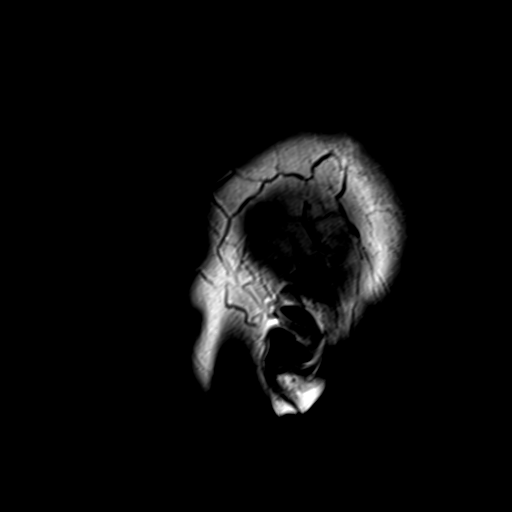

[Series 3: ep2d_diff_3 · axial · 3.0mm · 1.80mm/px · z∈[-9,+132]mm · 6 of 96 slices shown]
[im 1/96]
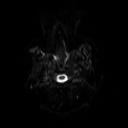
[im 20/96]
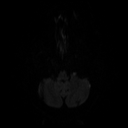
[im 39/96]
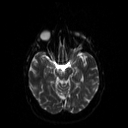
[im 58/96]
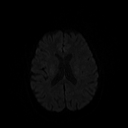
[im 77/96]
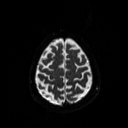
[im 96/96]
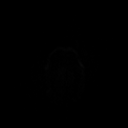

[Series 4: ep2d_diff_3_adc · axial · 3.0mm · 1.80mm/px · z∈[-9,+132]mm · 3 of 48 slices shown]
[im 1/48]
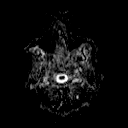
[im 24/48]
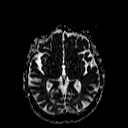
[im 48/48]
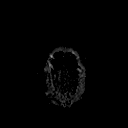

[Series 5: ep2d_diff_cor · coronal · 5.0mm · 1.77mm/px · 3 of 48 slices shown]
[im 1/48]
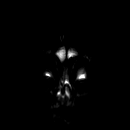
[im 24/48]
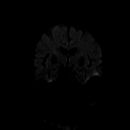
[im 48/48]
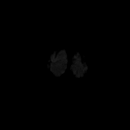

[Series 6: ep2d_diff_cor_adc · coronal · 5.0mm · 1.77mm/px · 2 of 24 slices shown]
[im 1/24]
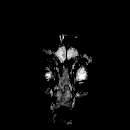
[im 24/24]
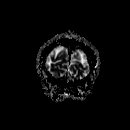

[Series 8: swi_images · axial · 2.0mm · 0.90mm/px · z∈[-17,+140]mm · 5 of 80 slices shown]
[im 1/80]
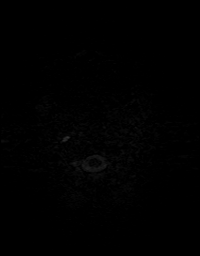
[im 20/80]
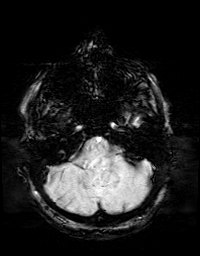
[im 40/80]
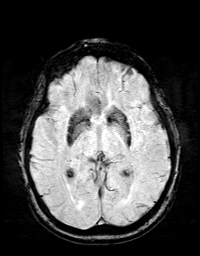
[im 60/80]
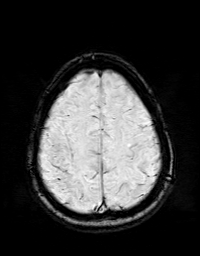
[im 80/80]
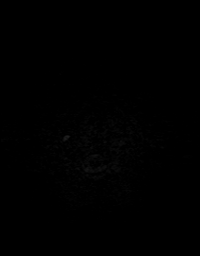

[Series 9: FLAIR · axial · 3.0mm · 0.43mm/px · z∈[-16,+140]mm · 2 of 27 slices shown]
[im 1/27]
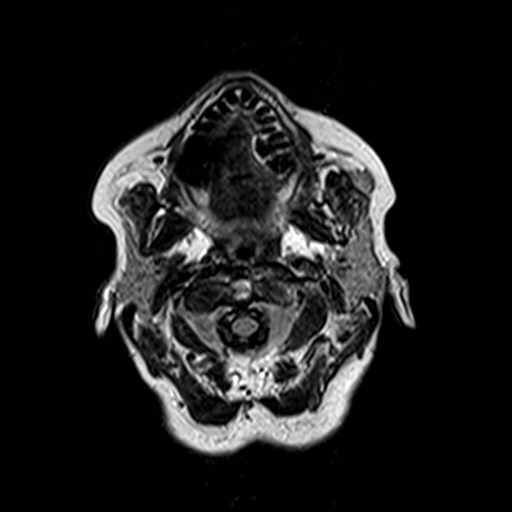
[im 27/27]
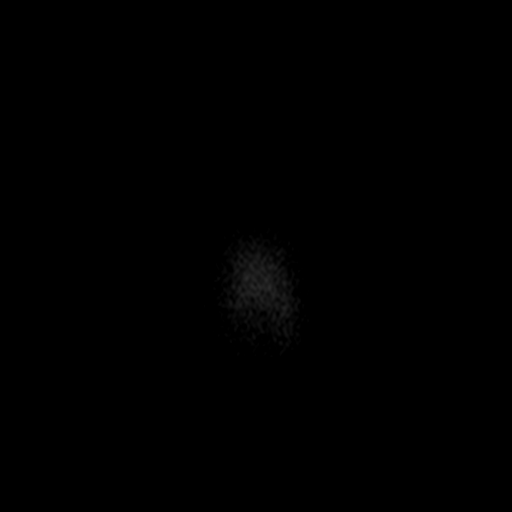

[Series 10: t2_tse_tra_512 · axial · 5.0mm · 0.60mm/px · z∈[-7,+130]mm · 2 of 24 slices shown]
[im 1/24]
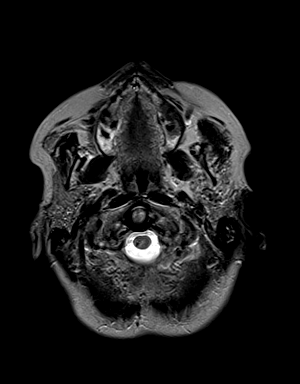
[im 24/24]
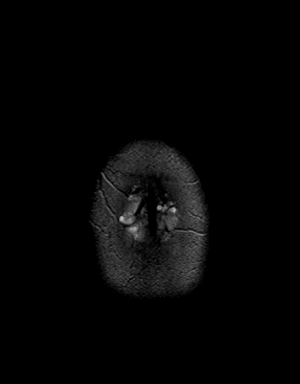

[Series 11: t1_mpr_tra · axial · 1.0mm · 0.72mm/px · z∈[-18,+141]mm · 10 of 160 slices shown]
[im 1/160]
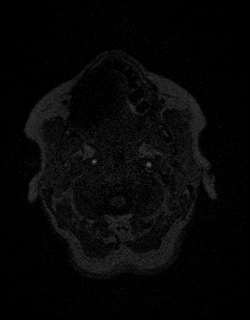
[im 18/160]
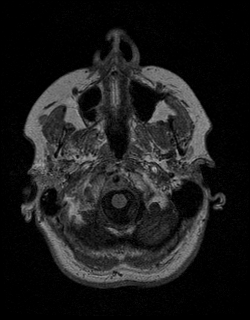
[im 36/160]
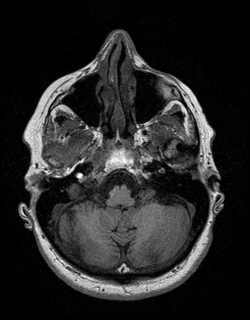
[im 54/160]
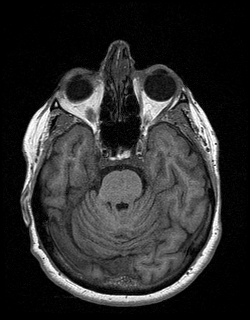
[im 71/160]
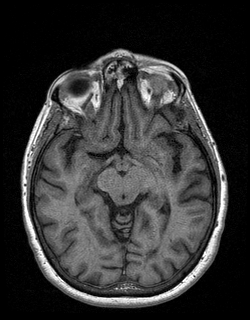
[im 89/160]
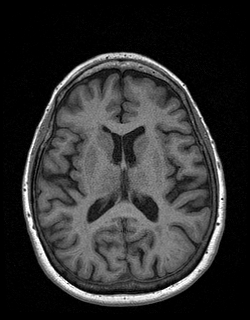
[im 107/160]
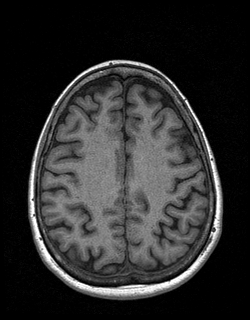
[im 124/160]
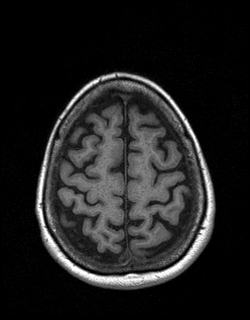
[im 142/160]
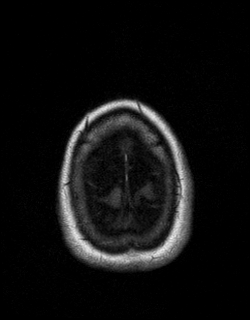
[im 160/160]
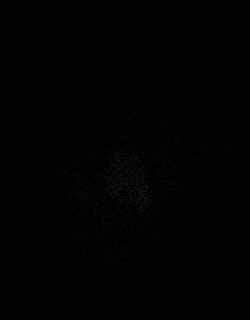

[Series 12: T2 · coronal · 5.0mm · 0.45mm/px · 2 of 26 slices shown]
[im 1/26]
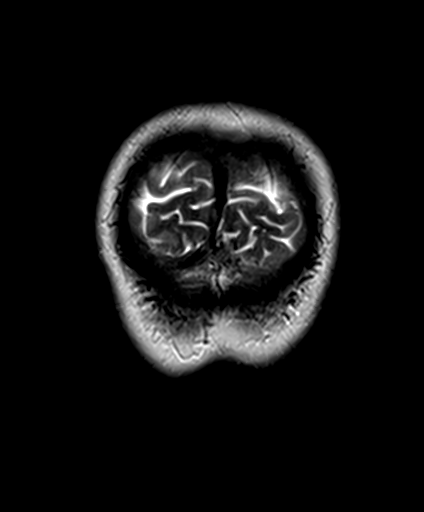
[im 26/26]
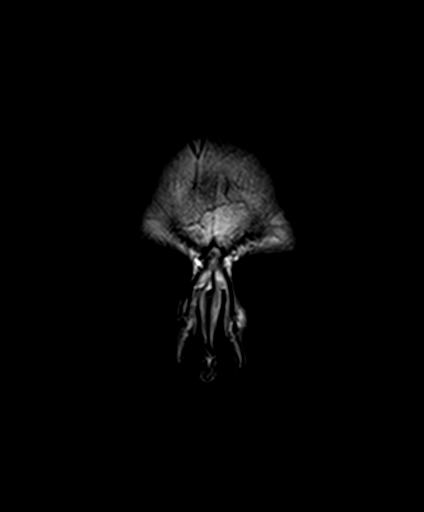

[Series 13: T1 post-contrast · coronal · 5.0mm · 0.72mm/px · 2 of 26 slices shown]
[im 1/26]
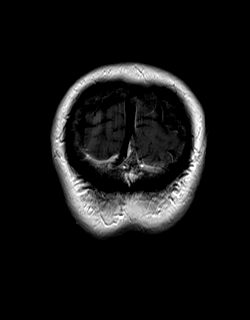
[im 26/26]
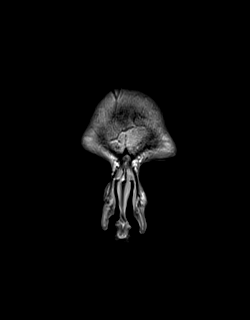

[Series 14: post t1_mpr_tra · axial · 1.0mm · 0.72mm/px · z∈[-18,+141]mm · 10 of 160 slices shown]
[im 1/160]
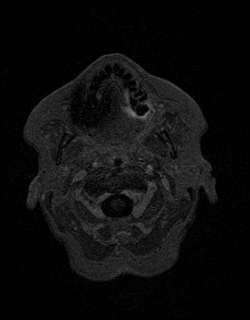
[im 18/160]
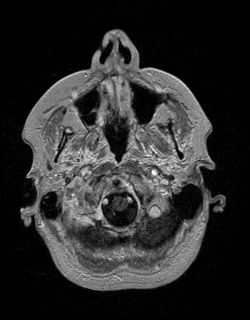
[im 36/160]
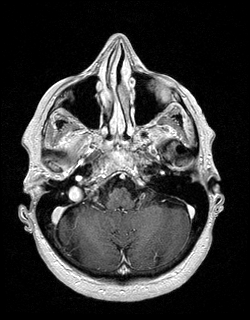
[im 54/160]
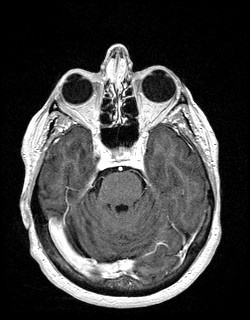
[im 71/160]
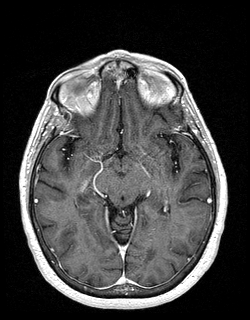
[im 89/160]
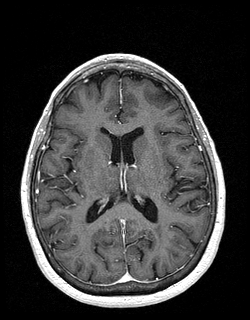
[im 107/160]
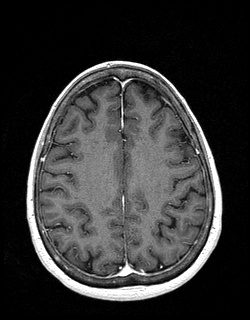
[im 124/160]
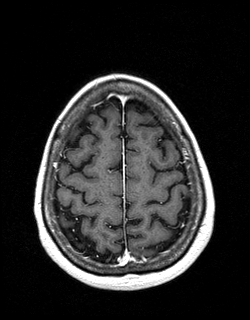
[im 142/160]
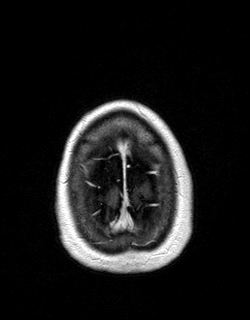
[im 160/160]
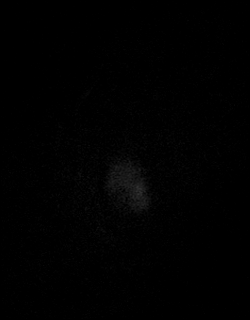

[48 of 48 positions shown; findings below may reference images not displayed]

FINDINGS: Brain: No acute infarction, hemorrhage, hydrocephalus, extra-axial
collection or mass lesion. Scattered T2/FLAIR hyperintensities
within the white matter, most likely related to chronic
microvascular ischemic disease and mild for age. Mild generalized
cerebral atrophy. Dilated perivascular spaces in bilateral inferior
basal ganglia. No abnormal enhancement.

Vascular: Major arterial flow voids are maintained at the skull
base.

Skull and upper cervical spine: Normal marrow signal.

Sinuses/Orbits: Negative.

Other: Trace bilateral mastoid effusions.
IMPRESSION: No acute intracranial abnormality.

## 2020-03-10 MED ORDER — GADOBENATE DIMEGLUMINE 529 MG/ML IV SOLN
13.0000 mL | Freq: Once | INTRAVENOUS | Status: AC | PRN
  Administered 2020-03-10: 13 mL via INTRAVENOUS

## 2020-03-12 ENCOUNTER — Telehealth: Payer: Self-pay

## 2020-03-12 NOTE — Telephone Encounter (Signed)
-----   Message from Cameron Sprang, MD sent at 03/11/2020 10:33 PM EST ----- Pls let her know MRI brain did not show any evidence of tumor, stroke, or bleed. It showed age-related changes. Proceed with memory testing as scheduled. Thanks

## 2020-03-12 NOTE — Telephone Encounter (Signed)
Pt called and informed that MRI brain did not show any evidence of tumor, stroke, or bleed. It showed age-related changes. Proceed with memory testing as scheduled

## 2020-03-29 ENCOUNTER — Encounter: Payer: Self-pay | Admitting: Psychology

## 2020-03-29 ENCOUNTER — Other Ambulatory Visit: Payer: Self-pay

## 2020-03-29 ENCOUNTER — Ambulatory Visit (INDEPENDENT_AMBULATORY_CARE_PROVIDER_SITE_OTHER): Payer: Medicare PPO | Admitting: Psychology

## 2020-03-29 ENCOUNTER — Ambulatory Visit: Payer: Medicare PPO | Admitting: Psychology

## 2020-03-29 DIAGNOSIS — F3340 Major depressive disorder, recurrent, in remission, unspecified: Secondary | ICD-10-CM | POA: Diagnosis not present

## 2020-03-29 DIAGNOSIS — F411 Generalized anxiety disorder: Secondary | ICD-10-CM | POA: Insufficient documentation

## 2020-03-29 DIAGNOSIS — R4189 Other symptoms and signs involving cognitive functions and awareness: Secondary | ICD-10-CM

## 2020-03-29 DIAGNOSIS — H8110 Benign paroxysmal vertigo, unspecified ear: Secondary | ICD-10-CM | POA: Insufficient documentation

## 2020-03-29 DIAGNOSIS — R011 Cardiac murmur, unspecified: Secondary | ICD-10-CM | POA: Insufficient documentation

## 2020-03-29 DIAGNOSIS — F325 Major depressive disorder, single episode, in full remission: Secondary | ICD-10-CM | POA: Insufficient documentation

## 2020-03-29 DIAGNOSIS — I10 Essential (primary) hypertension: Secondary | ICD-10-CM | POA: Insufficient documentation

## 2020-03-29 NOTE — Progress Notes (Signed)
   Psychometrician Note   Cognitive testing was administered to Sarah Underwood by Milana Kidney, B.S. (psychometrist) under the supervision of Dr. Christia Reading, Ph.D., licensed psychologist on 03/29/20. [name] did not appear overtly distressed by the testing session per behavioral observation or responses across self-report questionnaires. Dr. Christia Reading, Ph.D. checked in with [name] as needed to manage any distress related to testing procedures (if applicable). Rest breaks were offered.    The battery of tests administered was selected by Dr. Christia Reading, Ph.D. with consideration to [name]'s current level of functioning, the nature of her symptoms, emotional and behavioral responses during interview, level of literacy, observed level of motivation/effort, and the nature of the referral question. This battery was communicated to the psychometrist. Communication between Dr. Christia Reading, Ph.D. and the psychometrist was ongoing throughout the evaluation and Dr. Christia Reading, Ph.D. was immediately accessible at all times. Dr. Christia Reading, Ph.D. provided supervision to the psychometrist on the date of this service to the extent necessary to assure the quality of all services provided.    Sarah Underwood will return within approximately 1-2 weeks for an interactive feedback session with Dr. Melvyn Novas at which time her test performances, clinical impressions, and treatment recommendations will be reviewed in detail. [name] understands she can contact our office should she require our assistance before this time.  A total of 140 minutes of billable time were spent face-to-face with [name] by the psychometrist. This includes both test administration and scoring time. Billing for these services is reflected in the clinical report generated by Dr. Christia Reading, Ph.D..  This note reflects time spent with the psychometrician and does not include test scores or  any clinical interpretations made by Dr. Melvyn Novas. The full report will follow in a separate note.

## 2020-03-29 NOTE — Progress Notes (Signed)
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 cognitive functioning.  Test Results: Ms. Ekblad was fully oriented at the time of the current evaluation.  Intellectual abilities based upon educational and vocational attainment were estimated to be in the average range. Premorbid abilities were estimated to be within the well above average range based upon a single-word reading test.   Processing speed was average to well above average. Basic attention was average. More complex attention (e.g., working memory) was also average. Executive functioning was average to exceptionally high. She also performed in the well above average range across a task assessing safety/judgment.  Assessed receptive language abilities were below average, potentially hampered by hearing difficulties. Overall, Ms. Bardon did not exhibit pronounced difficulties comprehending task instructions and answered all questions asked of her appropriately. Assessed expressive language (e.g., verbal fluency and confrontation naming) was average to exceptionally high.     Assessed visuospatial/visuoconstructional abilities were average to above average.    Learning (i.e., encoding) of  novel verbal and visual information was average to above average. Spontaneous delayed recall (i.e., retrieval) of previously learned information was also average to above average. Retention rates were 100% across a story learning task, 80% across a list learning task, and 83% across a shape learning task. Performance across recognition tasks was below average to above average, suggesting evidence for information consolidation.   Results of emotional screening instruments suggested that recent symptoms of generalized anxiety were in the mild range, while symptoms of depression were within normal limits. A screening instrument assessing recent sleep quality suggested the presence of moderate sleep dysfunction. Based upon childhood retrospective analysis, she scored in the "likely to have ADHD" range in terms of symptoms of inattention and in the "highly likely to have ADHD" range in terms of symptoms of hyperactivity/impulsivity.   Tables of Scores:   Note: This summary of test scores accompanies the interpretive report and should not be considered in isolation without reference to the appropriate sections in the text. Descriptors are based on appropriate normative data and may be adjusted based on clinical judgment. The terms "impaired" and "within normal limits (WNL)" are used when a more specific  level of functioning cannot be determined.       Effort Testing:   DESCRIPTOR       ACS Word Choice: --- --- Within Expectation    *Based on 74 y/o norms     Dot Counting Test: --- --- Within Expectation  NAB EVI: --- --- Within Expectation  D-KEFS Color Word Effort Index: --- --- Within Expectation       Orientation:      Raw Score Percentile   NAB Orientation, Form 1 29/29 --- ---       Cognitive Screening:           Raw Score Percentile   SLUMS: 29/30 --- ---       Intellectual Functioning:           Standard Score Percentile   Test of Premorbid Functioning: 121 92 Well Above Average         Memory:          NAB Memory Module, Form 2: Standard Score/ T Score Percentile   Total Memory Index 102 55 Average  List Learning       Total Trials 1-3 22/36 (43) 25 Average    List B 2/12 (33) 5 Well Below Average    Short Delay Free Recall 10/12 (54) 66 Average    Long Delay Free Recall 8/12 (46) 34 Average    Retention Percentage 80 (48) 42 Average    Recognition Discriminability 4 (40) 16 Below Average  Shape Learning       Total Trials 1-3 15/27 (47) 38 Average    Delayed Recall 5/9 (46) 34 Average    Retention Percentage 83 (44) 27 Average    Recognition Discriminability 7 (51) 54 Average  Story Learning       Immediate Recall 56/80 (52) 58 Average    Delayed Recall 31/40 (56) 73 Average    Retention Percentage 100 (56) 73 Average  Daily Living Memory       Immediate Recall 47/51 (58) 79 Above Average    Delayed Recall 17/17 (60) 84 Above Average    Retention Percentage 100 (59) 82 Above Average    Recognition Hits 10/10 (60) 84 Above Average       Attention/Executive Function:          Trail Making Test (TMT): Raw Score (T Score) Percentile     Part A 21 secs.,  0 errors (65) 93 Well Above Average    Part B 45 secs.,  0 errors (67) 96 Well Above Average         Scaled Score Percentile   WAIS-IV Coding: 12 75 Above Average       NAB Attention Module, Form 1: T Score Percentile     Digits Forward 54 66 Average    Digits Backwards 45 31 Average        Scaled Score Percentile   WAIS-IV Similarities: 10 50 Average       D-KEFS Color-Word Interference Test: Raw Score (Scaled Score) Percentile     Color Naming 20 secs. (15) 95 Well Above Average    Word Reading 16 secs. (15) 95 Well Above Average    Inhibition 57 secs. (13) 84 Above Average      Total Errors 3 errors (10) 50 Average    Inhibition/Switching 49 secs. (14) 91 Above Average      Total Errors 0 errors (13) 84 Above Average       D-KEFS Verbal Fluency Test: Raw Score (Scaled Score) Percentile  Letter Total Correct 53 (15) 95 Well Above Average    Category Total Correct 50 (17) 99 Exceptionally High    Category Switching Total Correct 19 (18) >99 Exceptionally High    Category Switching Accuracy 18 (17) 99 Exceptionally High      Total Set Loss Errors 0 (13) 84 Above Average      Total Repetition Errors 2 (11) 63 Average       [place] Card Sorting Test: Raw Score Percentile     Categories (trials) 5 (64) >16 Within Normal Limits    Total Errors 8 >99 Exceptionally High    Perseverative Errors 5 >99 Exceptionally High    Non-Perseverative Errors 3 99 Exceptionally High    Failure to Maintain Set 0 --- ---       NAB Executive Functions Module, Form 1: T Score Percentile     Judgment 65 93 Well Above Average       Language:          Verbal Fluency Test: Raw Score (T Score) Percentile     Phonemic Fluency (FAS) 53 (57) 75 Above Average    Animal Fluency 19 (50) 50 Average        NAB Language Module, Form 1: T Score Percentile     Auditory Comprehension 39 14 Below Average    Naming 31/31 (58) 79 Above Average       Visuospatial/Visuoconstruction:      Raw Score Percentile   Clock Drawing: 10/10 --- Within Normal Limits       NAB Spatial Module, Form 1: T Score Percentile     Figure Drawing Copy 57 75 Above Average        Scaled Score Percentile   WAIS-IV Block Design: 9 37 Average       Mood and Personality:      Raw Score Percentile   Geriatric Depression Scale: 9 --- Within Normal Limits  Geriatric Anxiety Scale: 17 --- Mild    Somatic 10 --- Moderate    Cognitive 4 --- Mild    Affective 3 --- Minimal       Additional Questionnaires:      Raw Score Percentile   Adult Self-Report Scale (Current):       Inattention 18 --- Likely to have ADHD    Hyperactive/Impulsive 23 --- Likely to have ADHD  Adult Self-Report Scale (Childhood):       Inattention 18 --- Likely to have ADHD    Hyperactive/Impulsive 27 --- Highly likely to have ADHD       PROMIS Sleep  Disturbance Questionnaire: 30 --- Moderate   Informed Consent and Coding/Compliance:   Ms. Payment was provided with a verbal description of the nature and purpose of the present neuropsychological evaluation. Also reviewed were the foreseeable risks and/or discomforts and benefits of the procedure, limits of confidentiality, and mandatory reporting requirements of this provider. The patient was given the opportunity to ask questions and receive answers about the evaluation. Oral consent to participate was provided by the patient.   This evaluation was conducted by Christia Reading, Ph.D., licensed clinical neuropsychologist. Ms. Luck completed a 40 minute comprehensive clinical interview with Dr. Melvyn Novas, billed as one unit [phone], and 140 minutes of cognitive testing and scoring, billed as one unit [phone] and four additional units [id number]. Psychometrist Milana Kidney, B.S., assisted Dr. Melvyn Novas with test administration and scoring procedures. As a separate and discrete service, Dr. Melvyn Novas spent a total of 130 minutes in interpretation and report writing billed  as one unit [id number] and one unit [phone].

## 2020-04-02 ENCOUNTER — Encounter: Payer: Medicare PPO | Attending: Internal Medicine | Admitting: Registered"

## 2020-04-02 ENCOUNTER — Encounter: Payer: Self-pay | Admitting: Registered"

## 2020-04-02 DIAGNOSIS — H903 Sensorineural hearing loss, bilateral: Secondary | ICD-10-CM | POA: Diagnosis not present

## 2020-04-02 DIAGNOSIS — R7303 Prediabetes: Secondary | ICD-10-CM | POA: Diagnosis not present

## 2020-04-02 NOTE — Progress Notes (Signed)
On 04/02/20 patient completed a post core session of the Diabetes Prevention Program course virtually with [facility]. By the end of this session patients are able to complete the following objectives:   Virtual Visit via Video Note  I connected with Sarah Underwood by a video enabled application and verified that I am speaking with the correct person.  Location: Patient: Home Provider: [place].   Learning Objectives:  Counter self-defeating thoughts with positive self-statements  Define assertiveness.   List examples of ways to practice assertiveness.   Goals:   Record weight taken outside of class.   Track foods and beverages eaten each day in the "Food and Activity Tracker," including calories and fat grams for each item.    Track activity type, minutes you were active, and distance you reached each day in the "Food and Activity Tracker."   Follow-Up Plan: . Attend next session.  . Email completed "Food and Activity Trackers" before next session to be reviewed by Lifestyle [facility].

## 2020-04-05 ENCOUNTER — Ambulatory Visit (INDEPENDENT_AMBULATORY_CARE_PROVIDER_SITE_OTHER): Payer: Medicare PPO | Admitting: Psychology

## 2020-04-05 ENCOUNTER — Other Ambulatory Visit: Payer: Self-pay

## 2020-04-05 DIAGNOSIS — R4189 Other symptoms and signs involving cognitive functions and awareness: Secondary | ICD-10-CM

## 2020-04-05 DIAGNOSIS — R4689 Other symptoms and signs involving appearance and behavior: Secondary | ICD-10-CM | POA: Diagnosis not present

## 2020-04-05 DIAGNOSIS — F411 Generalized anxiety disorder: Secondary | ICD-10-CM | POA: Diagnosis not present

## 2020-04-05 DIAGNOSIS — F3340 Major depressive disorder, recurrent, in remission, unspecified: Secondary | ICD-10-CM

## 2020-04-05 NOTE — Patient Instructions (Signed)
Should there be concerns surrounding cognitive decline in the future, a repeat neuropsychological evaluation would be warranted at that time. The current evaluation will serve as an excellent baseline to draw future comparisons against.  It is likely that some of Ms. Oshita's ongoing sleep dysfunction (e.g., racing thoughts, overactive mind) is related to anxiety and ADHD traits. These difficulties can be improved via individual psychotherapy (e.g., Cognitive Behavioral Therapy or CBT). Additionally, engaging in regular mindfulness meditation, especially preceding sleep, would likely be beneficial. She could consider working with an individual therapist to address these difficulties directly and develop meditative skills.   Ms. Stacey is encouraged to attend to lifestyle factors for brain health (e.g., regular physical exercise, good nutrition habits, regular participation in cognitively-stimulating activities, and general stress management techniques), which are likely to have benefits for both emotional adjustment and cognition. In fact, in addition to promoting good general health, regular exercise incorporating aerobic activities (e.g., brisk walking, jogging, cycling, etc.) has been demonstrated to be a very effective treatment for depression and stress, with similar efficacy rates to both antidepressant medication and psychotherapy. Optimal control of vascular risk factors (including safe cardiovascular exercise and adherence to dietary recommendations) is encouraged.   If interested, there are some activities which have therapeutic value and can be useful in keeping her cognitively stimulated. For suggestions, Ms. Ricotta is encouraged to go to the following website: https://www.[facility].org/get-to-know-barrow/centers-programs/neurorehabilitation-center/neuro-rehab-apps-and-games/ which has options, categorized by level of difficulty. It should be noted that these activities should not be  viewed as a substitute for therapy.  When learning new information, she would benefit from information being broken up into small, manageable pieces. She may also find it helpful to articulate the material in her own words and in a context to promote encoding at the onset of a new task. This material may need to be repeated multiple times to promote encoding.  Memory can be improved using internal strategies such as rehearsal, repetition, chunking, mnemonics, association, and imagery. External strategies such as written notes in a consistently used memory journal, visual and nonverbal auditory cues such as a calendar on the refrigerator or appointments with alarm, such as on a cell phone, can also help maximize recall.    To address problems with fluctuating attention, she may wish to consider:   -Avoiding external distractions when needing to concentrate   -Limiting exposure to fast paced environments with multiple sensory demands   -Writing down complicated information and using checklists   -Attempting and completing one task at a time (i.e., no multi-tasking)   -Verbalizing aloud each step of a task to maintain focus   -Reducing the amount of information considered at one time  Reducing anxiety may also aid in the retrieval of information. Ms. Yin is encouraged to prepare scripts she can use socially when she experiences difficulty with word finding or memory. Such scripts should be brief explanations of the difficulty (e.g., "the word escapes me now") and allow her to move the conversation forward quickly rather than dwelling on the issue.

## 2020-04-05 NOTE — Progress Notes (Signed)
   Neuropsychology Feedback Session Sarah Underwood. [facility] of Neu[facility]  Reason for Referral:   Sarah Underwood a 74 y.o. right-handed Caucasian female referred by [name], M.D.,to characterize hercurrent cognitive functioning and assist with diagnostic clarity and treatment planning in the context of subjective cognitive decline, particularly surrounding reported word finding difficulties.   Feedback:   Sarah Underwood completed a comprehensive neuropsychological evaluation on 03/29/2020. Please refer to that encounter for the full report and recommendations. Briefly, results suggested neuropsychological functioning within normal limits. A relative weakness (i.e., below average range) was exhibited across a task assessing receptive language. However, this task was likely hampered by ongoing hearing loss. Based upon her retrospective analysis of her childhood, she scored in the "likely" and "highly likely" to have ADHD ranges across symptoms of inattention and hyperactivity/impulsivity respectively. While this questionnaire is not diagnostic of ADHD by itself, it does suggest that Sarah Underwood may very well have traits of this condition. This could explain her reported longstanding pattern of deficits in sustained attention and distractibility; however, she denied symptoms going back to early childhood during clinical interview. Additionally, across mood-related questionnaires, she reported acute symptoms of mild anxiety, as well as moderate sleep dysfunction. Overall, it appears likely that a combination of ongoing anxiety and sleep dysfunction represents the primary culprit for subjective cognitive dysfunction. These symptoms would exacerbate potential underlying traits of ADHD, thus worsening her day-to-day experience.  Sarah Underwood was accompanied during the current feedback session by her daughter Sarah Underwood via speakerphone. Content of the current session focused  on the results of her neuropsychological evaluation. Ms. Swenor and her daughter were given the opportunity to ask questions and their questions were answered. They were encouraged to reach out should additional questions arise. A copy of her report was provided at the conclusion of the visit.      17 minutes were spent conducting the current feedback session with Ms. Krizek, billed as one unit [phone].

## 2020-04-09 ENCOUNTER — Other Ambulatory Visit: Payer: Self-pay

## 2020-04-09 ENCOUNTER — Encounter (HOSPITAL_COMMUNITY): Payer: Self-pay | Admitting: Emergency Medicine

## 2020-04-09 ENCOUNTER — Ambulatory Visit (HOSPITAL_COMMUNITY)
Admission: EM | Admit: 2020-04-09 | Discharge: 2020-04-09 | Disposition: A | Discharge status: Home / Self Care | Payer: Medicare PPO | Attending: Emergency Medicine | Admitting: Emergency Medicine

## 2020-04-09 DIAGNOSIS — R21 Rash and other nonspecific skin eruption: Secondary | ICD-10-CM | POA: Diagnosis not present

## 2020-04-09 DIAGNOSIS — Z01812 Encounter for preprocedural laboratory examination: Secondary | ICD-10-CM | POA: Insufficient documentation

## 2020-04-09 DIAGNOSIS — Z20822 Contact with and (suspected) exposure to covid-19: Secondary | ICD-10-CM | POA: Diagnosis not present

## 2020-04-09 LAB — COMPREHENSIVE METABOLIC PANEL
ALT: 17 U/L (ref 0–44)
AST: 19 U/L (ref 15–41)
Albumin: 4 g/dL (ref 3.5–5.0)
Alkaline Phosphatase: 72 U/L (ref 38–126)
Anion gap: 8 (ref 5–15)
BUN: 14 mg/dL (ref 8–23)
CO2: 28 mmol/L (ref 22–32)
Calcium: 9.3 mg/dL (ref 8.9–10.3)
Chloride: 102 mmol/L (ref 98–111)
Creatinine, Ser: 0.79 mg/dL (ref 0.44–1.00)
GFR, Estimated: >60 mL/min (ref >60)
Glucose, Bld: 97 mg/dL (ref 70–99)
Potassium: 4.4 mmol/L (ref 3.5–5.1)
Sodium: 138 mmol/L (ref 135–145)
Total Bilirubin: 0.7 mg/dL (ref 0.3–1.2)
Total Protein: 7.3 g/dL (ref 6.5–8.1)

## 2020-04-09 LAB — CBC
HCT: 42.6 % (ref 36.0–46.0)
Hemoglobin: 13.8 g/dL (ref 12.0–15.0)
MCH: 28.3 pg (ref 26.0–34.0)
MCHC: 32.4 g/dL (ref 30.0–36.0)
MCV: 87.5 fL (ref 80.0–100.0)
Platelets: 316 10*3/uL (ref 150–400)
RBC: 4.87 MIL/uL (ref 3.87–5.11)
RDW: 13.2 % (ref 11.5–15.5)
WBC: 7.3 10*3/uL (ref 4.0–10.5)
nRBC: 0 % (ref 0.0–0.2)

## 2020-04-09 NOTE — ED Triage Notes (Signed)
Rash started today. Generalized small red "dots" on trunk. 2 Nickel sized plaques on right buttocks.  No itching.  Very slight headache.   Taking terbinafine- wonders if this rash is a side effect. Supposed to have a blood test Tuesday to check liver.

## 2020-04-09 NOTE — Discharge Instructions (Signed)
Stop taking the terbinafine until you see your primary care provider.    Go to the emergency department if you have acute worsening symptoms.    Call your primary care provider on Monday to schedule an appointment to be seen as soon as possible.    Take Zyrtec as directed.    Your lab test will be back in the morning.  I will call you with the results.

## 2020-04-09 NOTE — ED Provider Notes (Signed)
[place]    CSN: [id number] Arrival date & time: 04/09/20  1808      History   Chief Complaint Chief Complaint  Patient presents with   Rash    HPI Sarah Underwood is a 74 y.o. female.   Patient presents with a red maculopapular rash on her trunk that she noticed today.  She reports a larger lesion on her right hip.  The rash is not painful or pruritic.  She also reports a mild headache.  She denies fever, chills, sore throat, chest pain, cough, shortness of breath, abdominal pain, vomiting, diarrhea, or other symptoms.  She is concerned because she started taking terbinafine 1 month ago.  Her medical history includes asthma, dysrhythmia, heart murmur, Graves' disease, osteoarthritis, breast cancer, basal cell carcinoma, hard of hearing, anxiety, vertigo.  The history is provided by the patient and medical records.    Past Medical History:  Diagnosis Date   Basal cell carcinoma 1994   right side of node   Benign positional vertigo    with rolling over    Dysrhythmia    Family history of adverse reaction to anesthesia    pts mother had postop vomiting    Generalized anxiety disorder    GERD (gastroesophageal reflux disease)    Graves disease 2007   thyroid nodule   Hard of hearing    Heart murmur    History of breast cancer 1995   left - treated with mastectomy   History of radiation therapy 2001   37 sessions following recurrence of breast cancer in left chest wall   Hypertension    Knee pain, bilateral 12/14/2014   Major depressive disorder in remission    Mild persistent asthma, uncomplicated 12/25/2353   Nocturia    Numbness and tingling    bilat more per right hand   OA (osteoarthritis) of hip 09/08/2015   Ovarian mass    small solid nodule 8x2mm: Right ovary - stable since 2000   PONV (postoperative nausea and vomiting)    pt states occurred with pain meds questions darvocet    Seasonal allergies    Stress fracture of  metatarsal bone of left foot 12/15/2019    Patient Active Problem List   Diagnosis Date Noted   Major depressive disorder in remission 03/29/2020   Generalized anxiety disorder    Benign positional vertigo    Hypertension    Heart murmur    Stress fracture of metatarsal bone of left foot 12/15/2019   History of revision of total replacement of right hip joint 11/26/2017   Leg edema, left 09/26/2016   Post-nasal drainage 03/30/2016   OA (osteoarthritis) of hip 09/08/2015   Mild persistent asthma, uncomplicated 73/22/0254   Hip pain, bilateral 12/14/2014   Knee pain, bilateral 12/14/2014   Postmenopausal atrophic vaginitis 02/23/2013   Graves disease 2007   History of radiation therapy 2001   History of breast cancer 1995   Basal cell carcinoma 1994    Past Surgical History:  Procedure Laterality Date   APPENDECTOMY     basal cell removal  7/94   right nose   BREAST BIOPSY Right 1987   benign fibroadenoma   breast cancer recurrence Left 01/2000   at site of left chest wall after previous mastectomy with removal and treatment with radiaton.   CARPAL TUNNEL RELEASE Right 05/08/2013   Procedure: RIGHT CARPAL TUNNEL RELEASE;  Surgeon: Cammie Sickle., MD;  Location: [facility];  Service: Orthopedics;  Laterality: Right;   CATARACT EXTRACTION     03/20/14 left eye, 12/15 right eye   COLPOSCOPY W/ BIOPSY / CURETTAGE  03/12/12   benign, but + HR HPV   EYE SURGERY     bilat cataract surgery    MASTECTOMY Left 3/95   Stage IIb 0/14 LN ER/ PR + with mastectomy - Dr. Mendel Ryder   MOHS SURGERY     right nare   [place] Right 09/08/2015   Procedure: TOTAL HIP ARTHROPLASTY ANTERIOR APPROACH;  Surgeon: Gaynelle Arabian, MD;  Location: [facility];  Service: Orthopedics;  Laterality: Right;    OB History    Gravida  1   Para  1   Term  1   Preterm      AB       Living  1     SAB      IAB      Ectopic      Multiple      Live Births               Home Medications    Prior to Admission medications   Medication Sig Start Date End Date Taking? Authorizing Provider  terbinafine (LAMISIL) 250 MG tablet Take 250 mg by mouth daily.   Yes [provider]  acetaminophen (TYLENOL) 650 MG CR tablet Take 1,300 mg by mouth as needed.     [provider]  albuterol (PROVENTIL HFA;VENTOLIN HFA) 108 (90 Base) MCG/ACT inhaler Inhale 2 puffs into the lungs every 4 (four) hours as needed for wheezing. 10/30/17   Brand Males, MD  amLODipine (NORVASC) 2.5 MG tablet Take 1 tablet by mouth daily. 07/20/18   [provider]  anastrozole (ARIMIDEX) 1 MG tablet TAKE 1 TABLET BY MOUTH EVERY DAY 12/17/19   Ladell Pier, MD  Artificial Tear Solution (TEARS NATURALE OP) Place 2 drops into both eyes daily as needed (For dry eyes.).    [provider]  calcium-vitamin D 250-100 MG-UNIT tablet Take 1 tablet by mouth 2 (two) times daily.    [provider]  Cetirizine HCl (ZYRTEC ALLERGY PO) Take by mouth as needed.     [provider]  cimetidine (TAGAMET) 200 MG tablet Take 200 mg by mouth as needed (if taking starts with 2 tablets daily and weans down).    [provider]  FLOVENT HFA 110 MCG/ACT inhaler INHALE 2 PUFFS INTO THE LUNGS TWICE A DAY 10/28/19   Brand Males, MD  ibuprofen (ADVIL,MOTRIN) 600 MG tablet Take 600 mg by mouth every 6 (six) hours as needed.    [provider]  ketotifen (ALAWAY) 0.025 % ophthalmic solution Place 1 drop into both eyes daily.    [provider]  MAGNESIUM CITRATE PO Take 1 tablet by mouth 2 (two) times daily.    [provider]  Menaquinone-7 (VITAMIN K2) 100 MCG CAPS Take by mouth.    [provider]  Multiple Vitamin (MULTIVITAMIN) tablet Take 1 tablet by mouth daily.    [provider]  NON FORMULARY Allergy  Injections once a month  Patient not taking: Reported on 02/13/2020    [provider]  olmesartan (BENICAR) 40 MG tablet  11/27/19   [provider]  VITAMIN D PO Take 4,000 Int'l Units by mouth.    [provider]    Family History Family History  Problem Relation Age  of Onset   Breast cancer Mother 101       deceased 23   Osteoporosis Mother    Hypertension Mother    Dementia Mother        unspecified type   Mental illness Mother    Hypertension Father    Heart attack Father    Dementia Father        vascular dementia with history of "mini-strokes"   Breast cancer Sister 85       bilateral; recurrent 58 years late   Diabetes Brother    Lung cancer Maternal Grandfather        deceased 66   Cancer Paternal Grandmother        duodenal; deceased 22    Social History Social History   Tobacco Use   Smoking status: Never Smoker   Smokeless tobacco: Never Used  Scientific laboratory technician Use: Never used  Substance Use Topics   Alcohol use: Yes    Comment: rare   Drug use: No     Allergies   Other, Molds & smuts, Erythromycin, Penicillins, Hydrochlorothiazide, and Sulfa antibiotics   Review of Systems Review of Systems  Constitutional: Negative for chills and fever.  HENT: Negative for ear pain and sore throat.   Eyes: Negative for pain and visual disturbance.  Respiratory: Negative for cough and shortness of breath.   Cardiovascular: Negative for chest pain and palpitations.  Gastrointestinal: Negative for abdominal pain, diarrhea and vomiting.  Genitourinary: Negative for dysuria and hematuria.  Musculoskeletal: Negative for arthralgias and back pain.  Skin: Positive for rash. Negative for color change.  Neurological: Negative for seizures, syncope, weakness and numbness.  All other systems reviewed and are negative.    Physical Exam Triage Vital Signs ED Triage Vitals  Enc Vitals Group     BP 04/09/20 1858 (!) 153/69      Pulse Rate 04/09/20 1858 61     Resp 04/09/20 1858 16     Temp 04/09/20 1858 98.5 F (36.9 C)     Temp Source 04/09/20 1858 Oral     SpO2 04/09/20 1858 100 %     Weight --      Height --      Head Circumference --      Peak Flow --      Pain Score 04/09/20 1854 0     Pain Loc --      Pain Edu? --      Excl. in [place]? --    No data found.  Updated Vital Signs BP (!) 153/69    Pulse 61    Temp 98.5 F (36.9 C) (Oral)    Resp 16    LMP 04/25/2003    SpO2 100%   Visual Acuity Right Eye Distance:   Left Eye Distance:   Bilateral Distance:    Right Eye Near:   Left Eye Near:    Bilateral Near:     Physical Exam Vitals and nursing note reviewed.  Constitutional:      General: She is not in acute distress.    Appearance: She is well-developed and well-nourished. She is not ill-appearing.  HENT:     Head: Normocephalic and atraumatic.     Mouth/Throat:     Mouth: Mucous membranes are moist.     Pharynx: Oropharynx is clear.  Eyes:     Conjunctiva/sclera: Conjunctivae normal.  Cardiovascular:     Rate and Rhythm: Normal rate and regular rhythm.     Heart  sounds: Normal heart sounds.  Pulmonary:     Effort: Pulmonary effort is normal. No respiratory distress.     Breath sounds: Normal breath sounds.  Abdominal:     Palpations: Abdomen is soft.     Tenderness: There is no abdominal tenderness. There is no guarding or rebound.  Musculoskeletal:        General: No swelling, tenderness or edema. Normal range of motion.     Cervical back: Neck supple.  Skin:    General: Skin is warm and dry.     Findings: Rash present.     Comments: Red maculopapular rash on trunk with larger lesion on right hip.  See pictures for details.  Neurological:     General: No focal deficit present.     Mental Status: She is alert and oriented to person, place, and time.     Gait: Gait normal.  Psychiatric:        Mood and Affect: Mood and affect and mood normal.        Behavior: Behavior  normal.            UC Treatments / Results  Labs (all labs ordered are listed, but only abnormal results are displayed) Labs Reviewed  SARS CORONAVIRUS 2 (TAT 6-24 HRS)  CBC  COMPREHENSIVE METABOLIC PANEL    EKG   Radiology No results found.  Procedures Procedures (including critical care time)  Medications Ordered in UC Medications - No data to display  Initial Impression / Assessment and Plan / UC Course  I have reviewed the triage vital signs and the nursing notes.  Pertinent labs & imaging results that were available during my care of the patient were reviewed by me and considered in my medical decision making (see chart for details).   Rash.  CBC, CMP normal.  Treating with Zyrtec. (patient states she is unable to take Benadryl)  Instructed her to stop the terbinafine until she sees her doctor next week.  Instructed patient to go to the ED if she has acute worsening symptoms.  Instructed her to follow-up with her PCP on Monday.  Patient agrees to plan of care.   Final Clinical Impressions(s) / UC Diagnoses   Final diagnoses:  Rash     Discharge Instructions     Stop taking the terbinafine until you see your primary care provider.    Go to the emergency department if you have acute worsening symptoms.    Call your primary care provider on Monday to schedule an appointment to be seen as soon as possible.    Take Zyrtec as directed.    Your lab test will be back in the morning.  I will call you with the results.           ED Prescriptions    None     PDMP not reviewed this encounter.   Sharion Balloon, NP 04/10/20 [phone]

## 2020-04-10 LAB — SARS CORONAVIRUS 2 (TAT 6-24 HRS): SARS Coronavirus 2: NEGATIVE

## 2020-04-13 DIAGNOSIS — B351 Tinea unguium: Secondary | ICD-10-CM | POA: Diagnosis not present

## 2020-04-13 DIAGNOSIS — L270 Generalized skin eruption due to drugs and medicaments taken internally: Secondary | ICD-10-CM | POA: Diagnosis not present

## 2020-04-28 DIAGNOSIS — Z1159 Encounter for screening for other viral diseases: Secondary | ICD-10-CM | POA: Diagnosis not present

## 2020-04-30 ENCOUNTER — Encounter: Payer: Medicare PPO | Attending: Internal Medicine | Admitting: Registered"

## 2020-04-30 DIAGNOSIS — R7303 Prediabetes: Secondary | ICD-10-CM | POA: Insufficient documentation

## 2020-05-03 DIAGNOSIS — Z8371 Family history of colonic polyps: Secondary | ICD-10-CM | POA: Diagnosis not present

## 2020-05-03 DIAGNOSIS — D123 Benign neoplasm of transverse colon: Secondary | ICD-10-CM | POA: Diagnosis not present

## 2020-05-03 DIAGNOSIS — K5730 Diverticulosis of large intestine without perforation or abscess without bleeding: Secondary | ICD-10-CM | POA: Diagnosis not present

## 2020-05-03 DIAGNOSIS — Z800 Family history of malignant neoplasm of digestive organs: Secondary | ICD-10-CM | POA: Diagnosis not present

## 2020-05-03 DIAGNOSIS — K648 Other hemorrhoids: Secondary | ICD-10-CM | POA: Diagnosis not present

## 2020-05-03 DIAGNOSIS — Z1211 Encounter for screening for malignant neoplasm of colon: Secondary | ICD-10-CM | POA: Diagnosis not present

## 2020-05-05 DIAGNOSIS — D123 Benign neoplasm of transverse colon: Secondary | ICD-10-CM | POA: Diagnosis not present

## 2020-05-05 NOTE — Progress Notes (Signed)
On 04/30/20 patient completed a post core session of the Diabetes Prevention Program course virtually with [facility]. By the end of this session patients are able to complete the following objectives:   Virtual Visit via Video Note  I connected with Lindaann Slough by a video enabled application and verified that I am speaking with the correct person.  Location: Patient: Home.  Provider: Office.   Learning Objectives:  Evaluate personal time management skills and weaknesses  Identify ways to make time for self care/health promoting activities   List benefits of adequate sleep  Recall ways to improve sleep   Goals:   Record weight taken outside of class.   Track foods and beverages eaten each day in the "Food and Activity Tracker," including calories and fat grams for each item.    Track activity type, minutes you were active, and distance you reached each day in the "Food and Activity Tracker."   Follow-Up Plan:  Attend next session.   Email completed "Food and Activity Trackers" before next session to be reviewed by Lif[facility].

## 2020-05-06 ENCOUNTER — Encounter: Payer: Self-pay | Admitting: Registered"

## 2020-05-07 ENCOUNTER — Encounter: Payer: Medicare PPO | Admitting: Registered"

## 2020-05-07 DIAGNOSIS — R7303 Prediabetes: Secondary | ICD-10-CM

## 2020-05-14 ENCOUNTER — Encounter: Payer: Self-pay | Admitting: Registered"

## 2020-05-14 NOTE — Progress Notes (Signed)
On 05/07/20 pt completed a post core session of the Diabetes Prevention Program course virtually with [facility]. By the end of this session patients are able to complete the following objectives:   Virtual Visit via Video Note  I connected with Sarah Underwood by a video enabled application and verified that I am speaking with the correct person using two identifiers.  Location: Patient: Home.  Provider: Office.   Learning Objectives:  Reflect on lifestyle changes they have made since starting the DPP.   Set long-term goals to promote continued maintenance of lifestyle changes made during the program.   Goals:   Work toward reaching new long-term goals set during class.   Follow-Up Plan: . Contact Lifestyle Coach with questions/concerns PRN.

## 2020-06-03 ENCOUNTER — Other Ambulatory Visit: Payer: Self-pay | Admitting: Gastroenterology

## 2020-06-03 DIAGNOSIS — R198 Other specified symptoms and signs involving the digestive system and abdomen: Secondary | ICD-10-CM | POA: Diagnosis not present

## 2020-06-03 DIAGNOSIS — Z86010 Personal history of colonic polyps: Secondary | ICD-10-CM | POA: Diagnosis not present

## 2020-06-21 ENCOUNTER — Ambulatory Visit
Admission: RE | Admit: 2020-06-21 | Discharge: 2020-06-21 | Disposition: A | Discharge status: Home / Self Care | Payer: Medicare PPO | Source: Ambulatory Visit | Attending: Gastroenterology | Admitting: Gastroenterology

## 2020-06-21 ENCOUNTER — Other Ambulatory Visit: Payer: Self-pay

## 2020-06-21 DIAGNOSIS — Z853 Personal history of malignant neoplasm of breast: Secondary | ICD-10-CM | POA: Diagnosis not present

## 2020-06-21 DIAGNOSIS — K449 Diaphragmatic hernia without obstruction or gangrene: Secondary | ICD-10-CM | POA: Diagnosis not present

## 2020-06-21 DIAGNOSIS — K8020 Calculus of gallbladder without cholecystitis without obstruction: Secondary | ICD-10-CM | POA: Diagnosis not present

## 2020-06-21 DIAGNOSIS — K439 Ventral hernia without obstruction or gangrene: Secondary | ICD-10-CM | POA: Diagnosis not present

## 2020-06-21 DIAGNOSIS — R198 Other specified symptoms and signs involving the digestive system and abdomen: Secondary | ICD-10-CM

## 2020-06-21 IMAGING — CT CT ABD-PELV W/ CM
1 of 3 series · 12 of 32 positions shown, 18 images · IV contrast (APPLIED)
Comparison: None.

CLINICAL DATA: "Abnormal abdominal exam". History of left breast
cancer with mastectomy and radiation therapy. Appendectomy.

EXAM:
CT ABDOMEN AND PELVIS WITH CONTRAST
TECHNIQUE: Multidetector CT imaging of the abdomen and pelvis was performed
using the standard protocol following bolus administration of
intravenous contrast.
CONTRAST:  100mL [AX] IOPAMIDOL ([AX]) INJECTION 61%

[Series 2: abd/pelvis w/cm · axial · 0.71mm/px · z∈[-366,-51]mm · 12 of 75 slices shown, 18 images]
[im 6/75  soft-tissue]
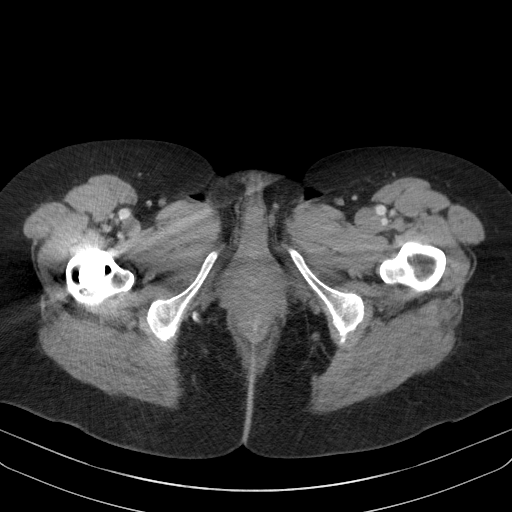
[im 6/75  bone]
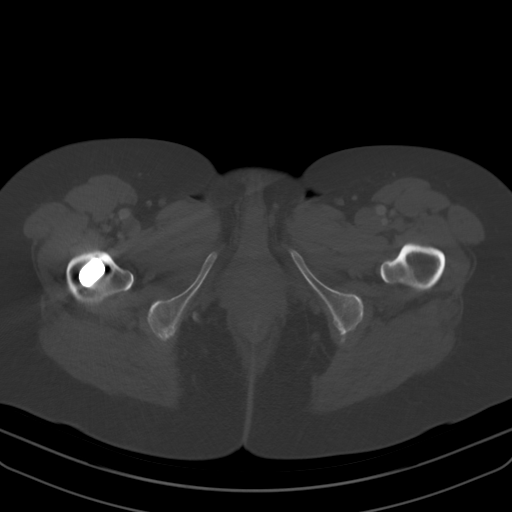
[im 11/75  soft-tissue]
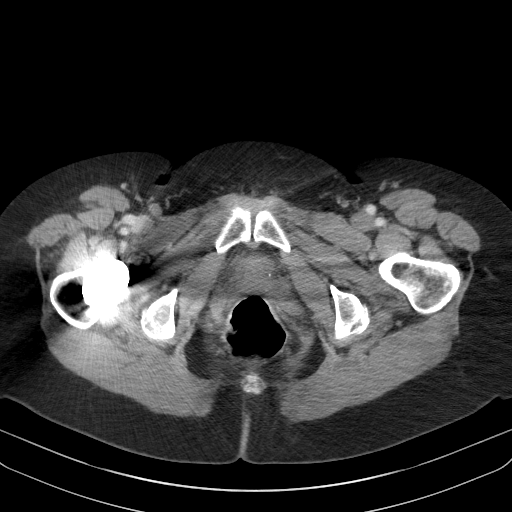
[im 16/75  soft-tissue]
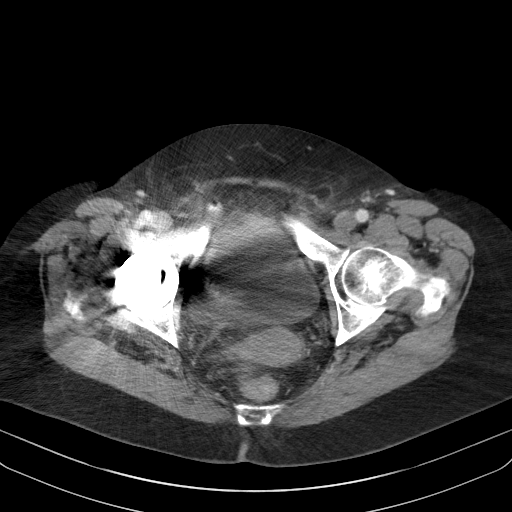
[im 22/75  soft-tissue]
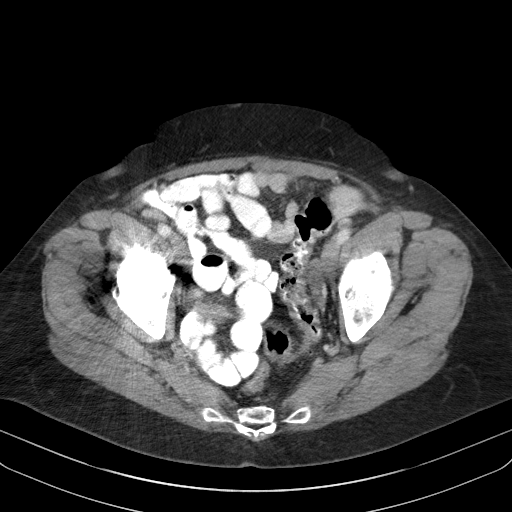
[im 27/75  soft-tissue]
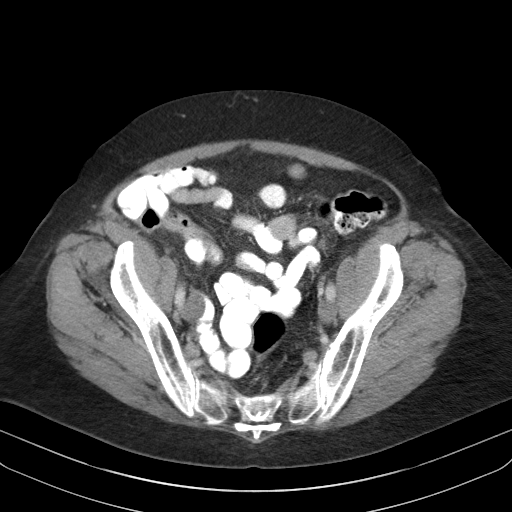
[im 32/75  soft-tissue]
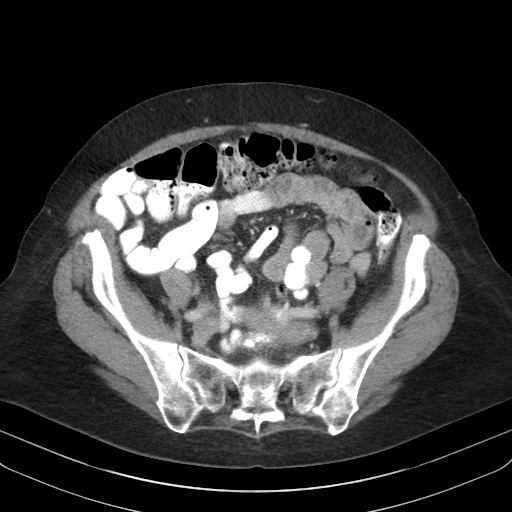
[im 43/75  soft-tissue]
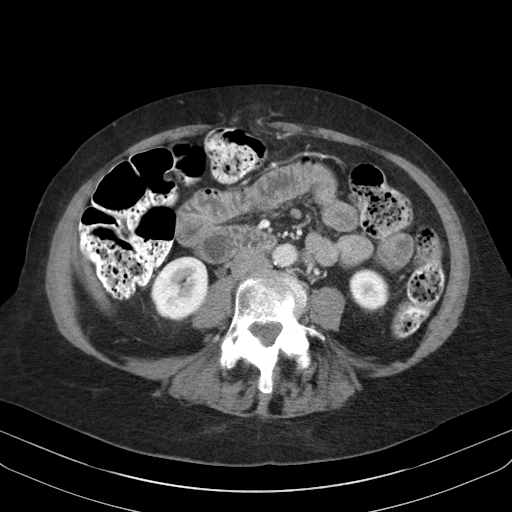
[im 48/75  soft-tissue]
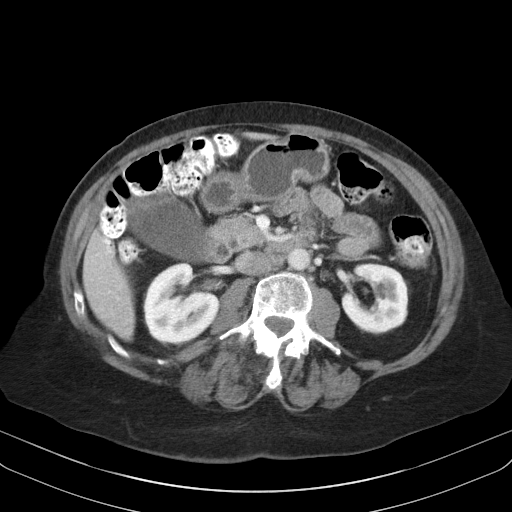
[im 53/75  soft-tissue]
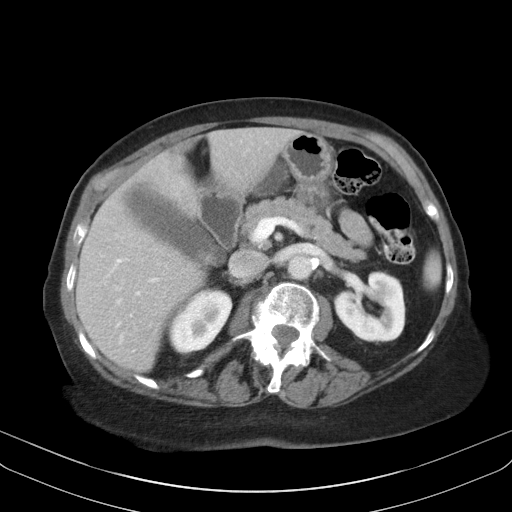
[im 53/75  lung]
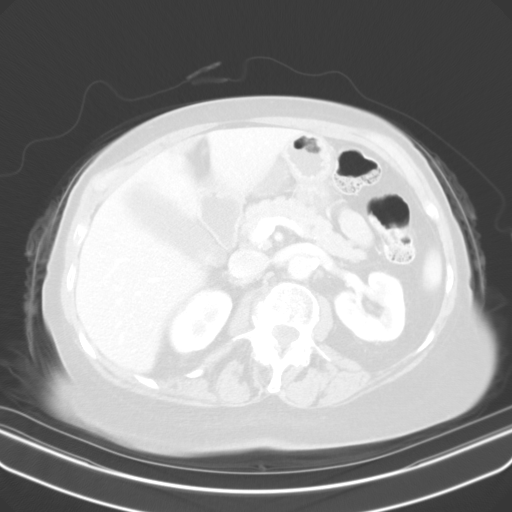
[im 53/75  bone]
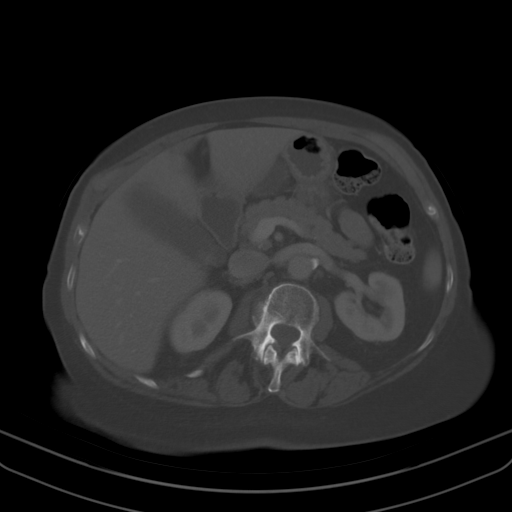
[im 59/75  soft-tissue]
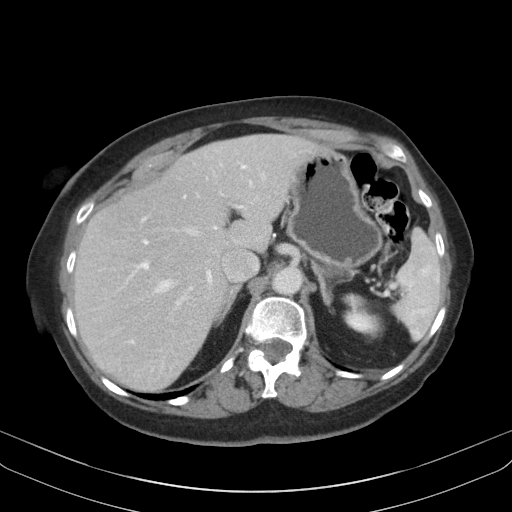
[im 59/75  lung]
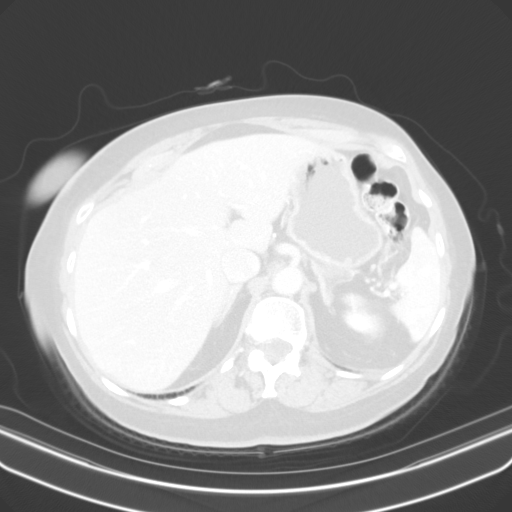
[im 64/75  soft-tissue]
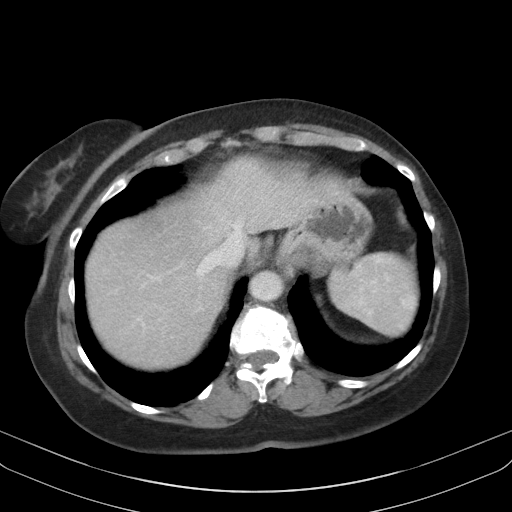
[im 64/75  lung]
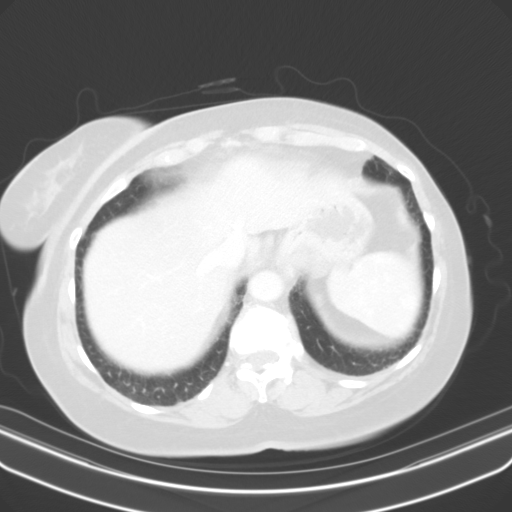
[im 69/75  soft-tissue]
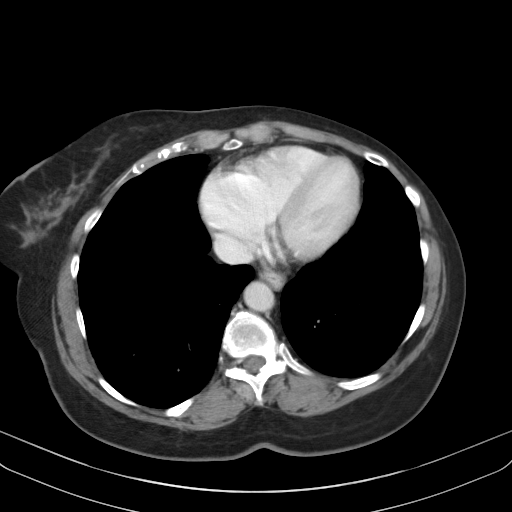
[im 69/75  lung]
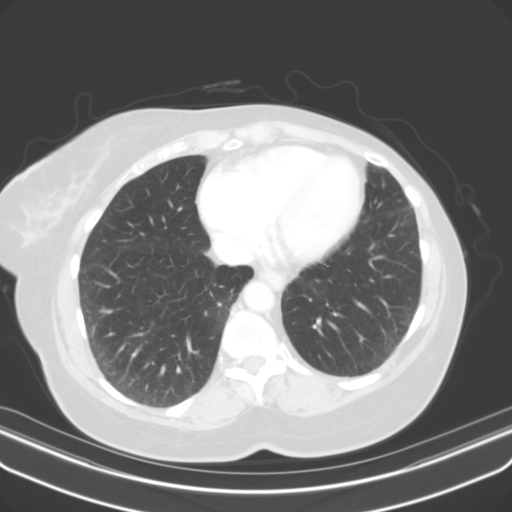

[12 of 32 positions shown; findings below may reference images not displayed]

FINDINGS: Lower chest: Left mastectomy. Clear lung bases. Normal heart size
without pericardial or pleural effusion. Tiny hiatal hernia.

Hepatobiliary: Normal liver. 1.7 cm gallstone. No acute
cholecystitis or biliary duct dilatation.

Pancreas: Normal, without mass or ductal dilatation.

Spleen: Normal in size, without focal abnormality.

Adrenals/Urinary Tract: Normal adrenal glands. Inter/lower pole
cm left renal lesion which measures slightly greater than fluid
density on portal venous phase image [DATE]. Fluid density on delayed
[DATE].

More cephalad anterior interpolar left renal vague hypoattenuating
1.6 cm lesion on [DATE] is significantly greater than fluid density,
including at 1.8 cm on [DATE].

No hydronephrosis. Degraded evaluation of the pelvis, secondary to
beam hardening artifact from right hip arthroplasty. Grossly normal
urinary bladder.

Stomach/Bowel: Normal remainder of the stomach. Scattered colonic
diverticula. Normal small bowel.

Vascular/Lymphatic: Aortic atherosclerosis. No abdominopelvic
adenopathy.

Reproductive: Normal uterus and adnexa.

Other: No significant free fluid. Tiny fat containing ventral
abdominal wall hernia including on 33/2.

Musculoskeletal: Convex left lumbar spine curvature. Mild T11
superior endplate compression deformity. Grade 1 L4-5 and grade 2
L5-S1 anterolisthesis.
IMPRESSION: 1.  No acute process in the abdomen or pelvis.
2. No evidence of metastatic disease.
3. Fat containing ventral abdominal wall hernia.
4. Indeterminate anterior interpolar left renal lesion. Solid
neoplasm versus complex cyst. Recommend further evaluation with pre
and post contrast abdominal MRI (favored) versus CT. A more
posterior inter/lower pole left renal lesion is likely a cyst but
technically indeterminate. Recommend attention on follow-up.
5.  Tiny hiatal hernia.
6. Cholelithiasis.

These results will be called to the ordering clinician or
representative by the Radiologist Assistant, and communication
documented in the PACS or [REDACTED].

## 2020-06-21 MED ORDER — IOPAMIDOL (ISOVUE-300) INJECTION 61%
100.0000 mL | Freq: Once | INTRAVENOUS | Status: AC | PRN
  Administered 2020-06-21: 100 mL via INTRAVENOUS

## 2020-07-15 DIAGNOSIS — Z20822 Contact with and (suspected) exposure to covid-19: Secondary | ICD-10-CM | POA: Diagnosis not present

## 2020-07-22 DIAGNOSIS — J3081 Allergic rhinitis due to animal (cat) (dog) hair and dander: Secondary | ICD-10-CM | POA: Diagnosis not present

## 2020-07-22 DIAGNOSIS — J4530 Mild persistent asthma, uncomplicated: Secondary | ICD-10-CM | POA: Diagnosis not present

## 2020-07-22 DIAGNOSIS — J3089 Other allergic rhinitis: Secondary | ICD-10-CM | POA: Diagnosis not present

## 2020-07-22 DIAGNOSIS — J301 Allergic rhinitis due to pollen: Secondary | ICD-10-CM | POA: Diagnosis not present

## 2020-07-25 ENCOUNTER — Other Ambulatory Visit: Payer: Self-pay | Admitting: Internal Medicine

## 2020-07-26 DIAGNOSIS — D4102 Neoplasm of uncertain behavior of left kidney: Secondary | ICD-10-CM | POA: Diagnosis not present

## 2020-07-26 DIAGNOSIS — N281 Cyst of kidney, acquired: Secondary | ICD-10-CM | POA: Diagnosis not present

## 2020-07-29 DIAGNOSIS — J3081 Allergic rhinitis due to animal (cat) (dog) hair and dander: Secondary | ICD-10-CM | POA: Diagnosis not present

## 2020-07-29 DIAGNOSIS — J301 Allergic rhinitis due to pollen: Secondary | ICD-10-CM | POA: Diagnosis not present

## 2020-07-29 DIAGNOSIS — J3089 Other allergic rhinitis: Secondary | ICD-10-CM | POA: Diagnosis not present

## 2020-08-11 DIAGNOSIS — B351 Tinea unguium: Secondary | ICD-10-CM | POA: Diagnosis not present

## 2020-08-11 DIAGNOSIS — K130 Diseases of lips: Secondary | ICD-10-CM | POA: Diagnosis not present

## 2020-08-13 DIAGNOSIS — Z20822 Contact with and (suspected) exposure to covid-19: Secondary | ICD-10-CM | POA: Diagnosis not present

## 2020-08-31 ENCOUNTER — Inpatient Hospital Stay: Payer: Medicare PPO | Attending: Oncology | Admitting: Oncology

## 2020-08-31 ENCOUNTER — Other Ambulatory Visit: Payer: Self-pay

## 2020-08-31 VITALS — BP 142/72 | HR 73 | Temp 98.2°F | Resp 18 | Ht 63.0 in | Wt 150.8 lb

## 2020-08-31 DIAGNOSIS — C50511 Malignant neoplasm of lower-outer quadrant of right female breast: Secondary | ICD-10-CM

## 2020-08-31 DIAGNOSIS — C50912 Malignant neoplasm of unspecified site of left female breast: Secondary | ICD-10-CM | POA: Diagnosis not present

## 2020-08-31 DIAGNOSIS — Z170 Estrogen receptor positive status [ER+]: Secondary | ICD-10-CM

## 2020-08-31 DIAGNOSIS — N951 Menopausal and female climacteric states: Secondary | ICD-10-CM | POA: Insufficient documentation

## 2020-08-31 NOTE — Progress Notes (Signed)
  [facility] OFFICE PROGRESS NOTE   Diagnosis: Breast cancer  INTERVAL HISTORY:   Sarah Underwood returns as scheduled.  She continues Arimidex.  Mild hot flashes.  Stable arthralgias.  No change over the chest wall. A bone density scan on 01/15/2020 revealed an increase in bone density at the left femoral neck and stable density at the bilateral radius.  A right mammogram on 01/15/2020 was negative She was found to have an indeterminate left renal lesion on a CT in February.  She is followed by Dr. Tresa Moore. Objective:  Vital signs in last 24 hours:  Blood pressure (!) 142/72, pulse 73, temperature 98.2 F (36.8 C), temperature source Oral, resp. rate 18, height 5\' 3"  (1.6 m), weight 150 lb 12.8 oz (68.4 kg), last menstrual period 04/25/2003, SpO2 100 %.    Lymphatics: No cervical, supraclavicular, or axillary nodes Resp: Lungs clear bilaterally Cardio: Regular rate and rhythm GI: No hepatosplenomegaly Vascular: No leg edema, bilateral lower leg varicosities Breast: Right breast without mass.  Status post left mastectomy.  No evidence for chest wall tumor recurrence.  Radiation telangiectasias at the left anterior chest wall    Lab Results:  Lab Results  Component Value Date   WBC 7.3 04/09/2020   HGB 13.8 04/09/2020   HCT 42.6 04/09/2020   MCV 87.5 04/09/2020   PLT 316 04/09/2020    CMP  Lab Results  Component Value Date   NA 138 04/09/2020   K 4.4 04/09/2020   CL 102 04/09/2020   CO2 28 04/09/2020   GLUCOSE 97 04/09/2020   BUN 14 04/09/2020   CREATININE 0.79 04/09/2020   CALCIUM 9.3 04/09/2020   PROT 7.3 04/09/2020   ALBUMIN 4.0 04/09/2020   AST 19 04/09/2020   ALT 17 04/09/2020   ALKPHOS 72 04/09/2020   BILITOT 0.7 04/09/2020   GFRNONAA >60 04/09/2020   GFRAA >60 09/10/2015   Medications: I have reviewed the patient's current medications.   Assessment/Plan: [name] was diagnosed with left-sided breast cancer 1995.  She developed a chest  wall recurrence in October 2001.  She will continue indefinite Arimidex. [name] is in clinical remission.   She will return for an office visit in 1 year.  She is scheduled for a right mammogram in September 2022.  She will have a repeat bone density scan in 2023.    Sarah Coder, MD  08/31/2020  12:04 PM

## 2020-09-01 DIAGNOSIS — R198 Other specified symptoms and signs involving the digestive system and abdomen: Secondary | ICD-10-CM | POA: Diagnosis not present

## 2020-09-01 DIAGNOSIS — Q6100 Congenital renal cyst, unspecified: Secondary | ICD-10-CM | POA: Diagnosis not present

## 2020-09-01 DIAGNOSIS — Z86010 Personal history of colonic polyps: Secondary | ICD-10-CM | POA: Diagnosis not present

## 2020-09-19 DIAGNOSIS — Z20822 Contact with and (suspected) exposure to covid-19: Secondary | ICD-10-CM | POA: Diagnosis not present

## 2020-09-23 ENCOUNTER — Encounter (HOSPITAL_BASED_OUTPATIENT_CLINIC_OR_DEPARTMENT_OTHER): Payer: Self-pay | Admitting: Obstetrics & Gynecology
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
## 2020-10-08 ENCOUNTER — Other Ambulatory Visit: Payer: Self-pay

## 2020-10-08 ENCOUNTER — Ambulatory Visit (INDEPENDENT_AMBULATORY_CARE_PROVIDER_SITE_OTHER): Payer: Medicare PPO | Admitting: Physician Assistant

## 2020-10-08 ENCOUNTER — Ambulatory Visit: Payer: Medicare PPO | Admitting: Neurology

## 2020-10-08 ENCOUNTER — Encounter: Payer: Self-pay | Admitting: Physician Assistant

## 2020-10-08 VITALS — BP 153/80 | HR 84 | Resp 18 | Ht 63.0 in | Wt 153.0 lb

## 2020-10-08 DIAGNOSIS — F3340 Major depressive disorder, recurrent, in remission, unspecified: Secondary | ICD-10-CM | POA: Diagnosis not present

## 2020-10-08 DIAGNOSIS — R400 Somnolence: Secondary | ICD-10-CM

## 2020-10-08 DIAGNOSIS — R4189 Other symptoms and signs involving cognitive functions and awareness: Secondary | ICD-10-CM

## 2020-10-08 DIAGNOSIS — F411 Generalized anxiety disorder: Secondary | ICD-10-CM | POA: Diagnosis not present

## 2020-10-08 NOTE — Patient Instructions (Addendum)
It was a pleasure to see you today at our office.   Recommendations:  Schedule home sleep study Referral will be sent for psychotherapy Do not hesitate to contact us when needed.    RECOMMENDATIONS FOR ALL PATIENTS WITH MEMORY PROBLEMS: 1. Continue to exercise (Recommend 30 minutes of walking everyday, or 3 hours every week) 2. Increase social interactions - continue going to [place] and enjoy social gatherings with friends and family 3. Eat healthy, avoid fried foods and eat more fruits and vegetables 4. Maintain adequate blood pressure, blood sugar, and blood cholesterol level. Reducing the risk of stroke and cardiovascular disease also helps promoting better memory. 5. Avoid stressful situations. Live a simple life and avoid aggravations. Organize your time and prepare for the next day in anticipation. 6. Sleep well, avoid any interruptions of sleep and avoid any distractions in the bedroom that may interfere with adequate sleep quality 7. Avoid sugar, avoid sweets as there is a strong link between excessive sugar intake, diabetes, and cognitive impairment We discussed the Mediterranean diet, which has been shown to help patients reduce the risk of progressive memory disorders and reduces cardiovascular risk. This includes eating fish, eat fruits and green leafy vegetables, nuts like almonds and hazelnuts, walnuts, and also use olive oil. Avoid fast foods and fried foods as much as possible. Avoid sweets and sugar as sugar use has been linked to worsening of memory function.  There is always a concern of gradual progression of memory problems. If this is the case, then we may need to adjust level of care according to patient needs. Support, both to the patient and caregiver, should then be put into place.     Mediterranean Diet A Mediterranean diet refers to food and lifestyle choices that are based on the traditions of countries located on the [employer]. This way of eating has been  shown to help prevent certain conditions and improve outcomes for people who have chronic diseases, like kidney disease and heart disease. What are tips for following this plan? Lifestyle  Cook and eat meals together with your family, when possible. Drink enough fluid to keep your urine clear or pale yellow. Be physically active every day. This includes: Aerobic exercise like running or swimming. Leisure activities like gardening, walking, or housework. Get 7-8 hours of sleep each night. If recommended by your health care provider, drink red wine in moderation. This means 1 glass a day for nonpregnant women and 2 glasses a day for men. A glass of wine equals 5 oz (150 mL). Reading food labels  Check the serving size of packaged foods. For foods such as rice and pasta, the serving size refers to the amount of cooked product, not dry. Check the total fat in packaged foods. Avoid foods that have saturated fat or trans fats. Check the ingredients list for added sugars, such as corn syrup. Shopping  At the grocery store, buy most of your food from the areas near the walls of the store. This includes: Fresh fruits and vegetables (produce). Grains, beans, nuts, and seeds. Some of these may be available in unpackaged forms or large amounts (in bulk). Fresh seafood. Poultry and eggs. Low-fat dairy products. Buy whole ingredients instead of prepackaged foods. Buy fresh fruits and vegetables in-season from local farmers markets. Buy frozen fruits and vegetables in resealable bags. If you do not have access to quality fresh seafood, buy precooked frozen shrimp or canned fish, such as tuna, salmon, or sardines. Buy small amounts of raw  or cooked vegetables, salads, or olives from the deli or salad bar at your store. Stock your pantry so you always have certain foods on hand, such as olive oil, canned tuna, canned tomatoes, rice, pasta, and beans. Cooking  Cook foods with extra-virgin olive oil instead  of using butter or other vegetable oils. Have meat as a side dish, and have vegetables or grains as your main dish. This means having meat in small portions or adding small amounts of meat to foods like pasta or stew. Use beans or vegetables instead of meat in common dishes like chili or lasagna. Experiment with different cooking methods. Try roasting or broiling vegetables instead of steaming or sauteing them. Add frozen vegetables to soups, stews, pasta, or rice. Add nuts or seeds for added healthy fat at each meal. You can add these to yogurt, salads, or vegetable dishes. Marinate fish or vegetables using olive oil, lemon juice, garlic, and fresh herbs. Meal planning  Plan to eat 1 vegetarian meal one day each week. Try to work up to 2 vegetarian meals, if possible. Eat seafood 2 or more times a week. Have healthy snacks readily available, such as: Vegetable sticks with hummus. Greek yogurt. Fruit and nut trail mix. Eat balanced meals throughout the week. This includes: Fruit: 2-3 servings a day Vegetables: 4-5 servings a day Low-fat dairy: 2 servings a day Fish, poultry, or lean meat: 1 serving a day Beans and legumes: 2 or more servings a week Nuts and seeds: 1-2 servings a day Whole grains: 6-8 servings a day Extra-virgin olive oil: 3-4 servings a day Limit red meat and sweets to only a few servings a month What are my food choices? Mediterranean diet Recommended Grains: Whole-grain pasta. Brown rice. Bulgar wheat. Polenta. Couscous. Whole-wheat bread. [place]. Vegetables: Artichokes. Beets. Broccoli. Cabbage. Carrots. Eggplant. Green beans. Chard. Kale. Spinach. Onions. Leeks. Peas. Squash. Tomatoes. Peppers. Radishes. Fruits: Apples. Apricots. Avocado. Berries. Bananas. Cherries. Dates. Figs. Grapes. Lemons. Melon. Oranges. Peaches. Plums. Pomegranate. Meats and other protein foods: Beans. Almonds. Sunflower seeds. Pine nuts. Peanuts. [place]. Salmon. Scallops. Shrimp.  [place]. Tilapia. Clams. Oysters. Eggs. Dairy: Low-fat milk. Cheese. Greek yogurt. Beverages: Water. Red wine. Herbal tea. Fats and oils: Extra virgin olive oil. Avocado oil. Grape seed oil. Sweets and desserts: [place] yogurt with honey. Baked apples. Poached pears. Trail mix. Seasoning and other foods: Basil. Cilantro. Coriander. Cumin. Mint. Parsley. Sage. Rosemary. Tarragon. Garlic. Oregano. Thyme. Pepper. Balsalmic vinegar. Tahini. Hummus. Tomato sauce. Olives. Mushrooms. Limit these Grains: Prepackaged pasta or rice dishes. Prepackaged cereal with added sugar. Vegetables: Deep fried potatoes (french fries). Fruits: Fruit canned in syrup. Meats and other protein foods: Beef. Pork. Lamb. Poultry with skin. Hot dogs. [place] Salines. Dairy: Ice cream. Sour cream. Whole milk. Beverages: Juice. Sugar-sweetened soft drinks. Beer. Liquor and spirits. Fats and oils: Butter. Canola oil. Vegetable oil. Beef fat (tallow). Lard. Sweets and desserts: Cookies. Cakes. Pies. Candy. Seasoning and other foods: Mayonnaise. Premade sauces and marinades. The items listed may not be a complete list. Talk with your dietitian about what dietary choices are right for you. Summary The Mediterranean diet includes both food and lifestyle choices. Eat a variety of fresh fruits and vegetables, beans, nuts, seeds, and whole grains. Limit the amount of red meat and sweets that you eat. Talk with your health care provider about whether it is safe for you to drink red wine in moderation. This means 1 glass a day for nonpregnant women and 2 glasses a day for men. A glass  of wine equals 5 oz (150 mL). This information is not intended to replace advice given to you by your health care provider. Make sure you discuss any questions you have with your health care provider. Document Released: 12/02/2015 Document Revised: 01/04/2016 Document Reviewed: 12/02/2015 Elsevier Interactive Patient Education  2017 [employer].

## 2020-10-26 ENCOUNTER — Telehealth: Payer: Self-pay | Admitting: Physician Assistant

## 2020-10-28 NOTE — Progress Notes (Unsigned)
No PA needed ref number [id number]

## 2020-11-10 ENCOUNTER — Encounter: Payer: Self-pay | Admitting: Cardiology

## 2020-11-10 ENCOUNTER — Other Ambulatory Visit: Payer: Self-pay

## 2020-11-10 ENCOUNTER — Ambulatory Visit: Payer: Medicare PPO | Admitting: Cardiology

## 2020-11-10 VITALS — BP 173/88 | HR 67 | Temp 96.5°F | Resp 16 | Ht 63.0 in | Wt 155.4 lb

## 2020-11-10 DIAGNOSIS — I10 Essential (primary) hypertension: Secondary | ICD-10-CM | POA: Diagnosis not present

## 2020-11-10 DIAGNOSIS — I493 Ventricular premature depolarization: Secondary | ICD-10-CM | POA: Diagnosis not present

## 2020-11-10 DIAGNOSIS — R0609 Other forms of dyspnea: Secondary | ICD-10-CM | POA: Diagnosis not present

## 2020-11-10 DIAGNOSIS — I491 Atrial premature depolarization: Secondary | ICD-10-CM

## 2020-11-10 DIAGNOSIS — R002 Palpitations: Secondary | ICD-10-CM | POA: Diagnosis not present

## 2020-11-10 DIAGNOSIS — R0600 Dyspnea, unspecified: Secondary | ICD-10-CM

## 2020-11-10 MED ORDER — METOPROLOL SUCCINATE ER 50 MG PO TB24: 50.0000 mg | ORAL_TABLET | Freq: Every day | ORAL | 2 refills | Status: DC

## 2020-11-10 NOTE — Progress Notes (Signed)
Primary Physician/Referring:  Velna Hatchet, MD  Patient ID: Sarah Underwood, female    DOB: Dec 20, 1945, 75 y.o.   MRN: [id number]  Chief Complaint  Patient presents with   Palpitations   New Patient (Initial Visit)    Referred by Velna Hatchet, MD   HPI:    Sarah Underwood  is a 75 y.o. Caucasian female with a past medical history significant for HTN, mild mitral regurgitation, mild to moderate tricuspid regurgitation, mild pulmonary hypertension, Graves disease, asthma, and breast cancer.   Patient presents today for evaluation of palpitations that began roughly 5 weeks ago. She states that she has a history of PVCs but these palpitations "seem different." She described the palpitations as occurring at rest. She never notices if they wake her from sleep at night. She experiences a "hard beat" followed by slow beats. Occasionally, she feels fullness in her neck when the palpitations occur. The patient was started on Symbicort for her asthma before the palpitations began. She saw her friend who is a Software engineer and they listened to her heart and told her to see her cardiologist. The pharmacist also told her that the Symbicort could elevate her BP and even cause palpitations as a rare side effect. The patient stopped all of her medications except for her BP and cancer medications. Despite stopping her Symbicort, the patient has continued to experience palpitations. The patient has been able to maintain the same activity level as before the palpitations started.   The patient also endorses 3 episodes of SOB while resting. She notices this when she lays down in bed at night. She states that the SOB gets better after several minutes in bed and she does not have to sit up in order to catch her breath. The patient denies chest pain, dyspnea on exertion, abnormal weight gain, syncope, dizziness, fatigue, and edema. The patient does endorse having anxiety regarding these palpitations.    The patient's  BP is elevated in the office today at 173/88. She reports that it has been high at home with most of her readings being around 159/75. She had it checked at [facility] to ensure her home cuff was accurate and it was also elevated at [facility].   Past Medical History:  Diagnosis Date   Basal cell carcinoma 1994   right side of node   Benign positional vertigo    with rolling over    Dysrhythmia    Family history of adverse reaction to anesthesia    pts mother had postop vomiting    Generalized anxiety disorder    GERD (gastroesophageal reflux disease)    Graves disease 2007   thyroid nodule   Hard of hearing    Heart murmur    History of breast cancer 1995   left - treated with mastectomy   History of radiation therapy 2001   37 sessions following recurrence of breast cancer in left chest wall   Hypertension    Knee pain, bilateral 12/14/2014   Major depressive disorder in remission    Mild persistent asthma, uncomplicated 01/01/3715   Nocturia    Numbness and tingling    bilat more per right hand   OA (osteoarthritis) of hip 09/08/2015   Ovarian mass    small solid nodule 8x23m: Right ovary - stable since 2000   PONV (postoperative nausea and vomiting)    pt states occurred with pain meds questions darvocet    Seasonal allergies    Stress fracture of metatarsal bone of left  foot 12/15/2019   Past Surgical History:  Procedure Laterality Date   APPENDECTOMY     basal cell removal  7/94   right nose   BREAST BIOPSY Right 1987   benign fibroadenoma   breast cancer recurrence Left 01/2000   at site of left chest wall after previous mastectomy with removal and treatment with radiaton.   CARPAL TUNNEL RELEASE Right 05/08/2013   Procedure: RIGHT CARPAL TUNNEL RELEASE;  Surgeon: Cammie Sickle., MD;  Location: [facility];  Service: Orthopedics;  Laterality: Right;   CATARACT EXTRACTION     03/20/14 left eye, 12/15 right eye   COLPOSCOPY W/ BIOPSY / CURETTAGE  03/12/12    benign, but + HR HPV   EYE SURGERY     bilat cataract surgery    MASTECTOMY Left 3/95   Stage IIb 0/14 LN ER/ PR + with mastectomy - Dr. Mendel Ryder   MOHS SURGERY     right nare   [place] Right 09/08/2015   Procedure: TOTAL HIP ARTHROPLASTY ANTERIOR APPROACH;  Surgeon: Gaynelle Arabian, MD;  Location: [facility];  Service: Orthopedics;  Laterality: Right;   Family History  Problem Relation Age of Onset   Breast cancer Mother 19       deceased 25   Osteoporosis Mother    Hypertension Mother    Dementia Mother        unspecified type   Mental illness Mother    Hypertension Father    Heart attack Father    Dementia Father        vascular dementia with history of "mini-strokes"   Breast cancer Sister 1       bilateral; recurrent 50 years late   Diabetes Brother    Lung cancer Maternal Grandfather        deceased 45   Cancer Paternal Grandmother        duodenal; deceased 72    Social History   Tobacco Use   Smoking status: Never   Smokeless tobacco: Never  Substance Use Topics   Alcohol use: Yes    Comment: rare   Marital Status: Divorced  ROS  Review of Systems  Cardiovascular:  Positive for palpitations. Negative for chest pain, dyspnea on exertion, leg swelling, near-syncope and syncope.  Respiratory:  Positive for shortness of breath (see HPI).   Neurological:  Negative for dizziness.  Psychiatric/Behavioral:  The patient is nervous/anxious (due to concern about palpitations).   Objective  Blood pressure (!) 173/88, pulse 67, temperature (!) 96.5 F (35.8 C), temperature source Temporal, resp. rate 16, height '5\' 3"'  (1.6 m), weight 155 lb 6.4 oz (70.5 kg), last menstrual period 04/25/2003, SpO2 98 %. Body mass index is 27.53 kg/m.  Vitals with BMI 11/10/2020 11/10/2020 10/08/2020  Height - '5\' 3"'  '5\' 3"'   Weight - 155 lbs 6 oz 153 lbs  BMI - 97.67 34.19  Systolic 379 024 097  Diastolic 88 76 80   Pulse 67 75 84     Physical Exam Neck:     Vascular: No carotid bruit or JVD.  Cardiovascular:     Rate and Rhythm: Normal rate. Rhythm regularly irregular. FrequentExtrasystoles are present.    Pulses: Intact distal pulses.     Heart sounds: Normal heart sounds. No murmur heard.   No gallop.  Pulmonary:     Effort: Pulmonary effort is normal.     Breath  sounds: Normal breath sounds.  Abdominal:     General: Bowel sounds are normal.     Palpations: Abdomen is soft.     Tenderness: There is no abdominal tenderness.  Musculoskeletal:     Right lower leg: No edema.     Left lower leg: No edema.  Neurological:     Mental Status: She is alert.     Laboratory examination:   Recent Labs    04/09/20 1956  NA 138  K 4.4  CL 102  CO2 28  GLUCOSE 97  BUN 14  CREATININE 0.79  CALCIUM 9.3  GFRNONAA >60   CrCl cannot be calculated (Patient's most recent lab result is older than the maximum 21 days allowed.).  CMP Latest Ref Rng & Units 04/09/2020 09/10/2015 09/09/2015  Glucose 70 - 99 mg/dL 97 125(H) 178(H)  BUN 8 - 23 mg/dL '14 8 9  ' Creatinine 0.44 - 1.00 mg/dL 0.79 0.68 0.73  Sodium 135 - 145 mmol/L 138 137 135  Potassium 3.5 - 5.1 mmol/L 4.4 3.9 4.5  Chloride 98 - 111 mmol/L 102 102 106  CO2 22 - 32 mmol/L '28 28 24  ' Calcium 8.9 - 10.3 mg/dL 9.3 8.8(L) 8.5(L)  Total Protein 6.5 - 8.1 g/dL 7.3 - -  Total Bilirubin 0.3 - 1.2 mg/dL 0.7 - -  Alkaline Phos 38 - 126 U/L 72 - -  AST 15 - 41 U/L 19 - -  ALT 0 - 44 U/L 17 - -   CBC Latest Ref Rng & Units 04/09/2020 09/10/2015 09/09/2015  WBC 4.0 - 10.5 K/uL 7.3 17.1(H) 12.8(H)  Hemoglobin 12.0 - 15.0 g/dL 13.8 11.3(L) 11.4(L)  Hematocrit 36.0 - 46.0 % 42.6 33.0(L) 33.3(L)  Platelets 150 - 400 K/uL 316 305 282    Lipid Panel No results for input(s): CHOL, TRIG, LDLCALC, VLDL, HDL, CHOLHDL, LDLDIRECT in the last 8760 hours. Lipid Panel  No results found for: CHOL, TRIG, HDL, CHOLHDL, VLDL, LDLCALC, LDLDIRECT, LABVLDL    HEMOGLOBIN A1C No results found for: HGBA1C, MPG TSH No results for input(s): TSH in the last 8760 hours.  External labs:   Labs 11/03/2019:  TSH normal.  Vitamin D normal at 55.1.  A1c 5.6%.  Serum glucose 90 mg, BUN 15, creatinine 0.8, EGFR 70 mL, potassium 4.2.  CBC normal with a hemoglobin of 13.5 and platelet count of 274.  Total cholesterol 162, triglycerides 41, HDL 67, LDL 87.  Non-HDL cholesterol 95.  Medications and allergies   Allergies  Allergen Reactions   Other Swelling    Trees   Molds & Smuts Other (See Comments)   Erythromycin Nausea And Vomiting   Penicillins Hives and Other (See Comments)    Has patient had a PCN reaction causing immediate rash, facial/tongue/throat swelling, SOB or lightheadedness with hypotension: no Has patient had a PCN reaction causing severe rash involving mucus membranes or skin necrosis: no Has patient had a PCN reaction that required hospitalization no Has patient had a PCN reaction occurring within the last 10 years: no If all of the above answers are "NO", then may proceed with Cephalosporin use.    Terbinafine And Related     rash   Hydrochlorothiazide Rash    Pt states caused a sunburn type reaction    Sulfa Antibiotics Rash     Medication prior to this encounter:   Outpatient Medications Prior to Visit  Medication Sig Dispense Refill   acetaminophen (TYLENOL) 650 MG CR tablet Take 1,300 mg by mouth as needed.  albuterol (PROVENTIL HFA;VENTOLIN HFA) 108 (90 Base) MCG/ACT inhaler Inhale 2 puffs into the lungs every 4 (four) hours as needed for wheezing. 1 Inhaler 3   amLODipine (NORVASC) 2.5 MG tablet Take 1 tablet by mouth daily.     anastrozole (ARIMIDEX) 1 MG tablet TAKE 1 TABLET BY MOUTH EVERY DAY 90 tablet 3   Artificial Tear Solution (TEARS NATURALE OP) Place 2 drops into both eyes daily as needed (For dry eyes.).     budesonide-formoterol (SYMBICORT) 80-4.5 MCG/ACT inhaler Inhale 2 puffs into the lungs 2  (two) times daily.     calcium-vitamin D 250-100 MG-UNIT tablet Take 1 tablet by mouth 2 (two) times daily.     Cetirizine HCl (ZYRTEC ALLERGY PO) Take by mouth as needed.      cimetidine (TAGAMET) 200 MG tablet Take 200 mg by mouth as needed (if taking starts with 2 tablets daily and weans down).     ibuprofen (ADVIL,MOTRIN) 600 MG tablet Take 600 mg by mouth every 6 (six) hours as needed.     ketotifen (ZADITOR) 0.025 % ophthalmic solution Place 1 drop into both eyes daily.     MAGNESIUM CITRATE PO Take 1 tablet by mouth 2 (two) times daily.     Menaquinone-7 (VITAMIN K2) 100 MCG CAPS Take by mouth.     Multiple Vitamin (MULTIVITAMIN) tablet Take 1 tablet by mouth daily.     olmesartan (BENICAR) 40 MG tablet      VITAMIN D PO Take 4,000 Int'l Units by mouth.     FLOVENT HFA 110 MCG/ACT inhaler INHALE 2 PUFFS INTO THE LUNGS TWICE A DAY (Patient not taking: No sig reported) 36 Inhaler 1   NON FORMULARY Allergy Injections once a month  (Patient not taking: No sig reported)     terbinafine (LAMISIL) 250 MG tablet Take 250 mg by mouth daily.     No facility-administered medications prior to visit.     Medication list after today's encounter   Current Outpatient Medications  Medication Instructions   acetaminophen (TYLENOL) 1,300 mg, Oral, As needed   albuterol (PROVENTIL HFA;VENTOLIN HFA) 108 (90 Base) MCG/ACT inhaler 2 puffs, Inhalation, Every 4 hours PRN   amLODipine (NORVASC) 2.5 MG tablet 1 tablet, Oral, Daily   anastrozole (ARIMIDEX) 1 MG tablet TAKE 1 TABLET BY MOUTH EVERY DAY   Artificial Tear Solution (TEARS NATURALE OP) 2 drops, Both Eyes, Daily PRN   budesonide-formoterol (SYMBICORT) 80-4.5 MCG/ACT inhaler 2 puffs, Inhalation, 2 times daily   calcium-vitamin D 250-100 MG-UNIT tablet 1 tablet, Oral, 2 times daily   Cetirizine HCl (ZYRTEC ALLERGY PO) Oral, As needed   cimetidine (TAGAMET) 200 mg, Oral, As needed   ibuprofen (ADVIL) 600 mg, Oral, Every 6 hours PRN   ketotifen  (ZADITOR) 0.025 % ophthalmic solution 1 drop, Both Eyes, Daily   MAGNESIUM CITRATE PO 1 tablet, Oral, 2 times daily   Menaquinone-7 (VITAMIN K2) 100 MCG CAPS Oral   metoprolol succinate (TOPROL-XL) 50 mg, Oral, Daily, Take with or immediately following a meal.   Multiple Vitamin (MULTIVITAMIN) tablet 1 tablet, Oral, Daily   olmesartan (BENICAR) 40 MG tablet No dose, route, or frequency recorded.   VITAMIN D PO 4,000 Int'l Units, Oral    Radiology:   No results found.  Cardiac Studies:   Stress EKG 08/20/2013: Indications: Palpitations.  Conclusions: Negative for ischemia. Hypertensive.  The baseline ECG showed NSR,LVH. PAC frequent. During exercise there was no ST-T changes of ischemia. Symptoms: Dyspnea. THR met. Arrhythmia: PAC decreased with exercise.  Continue primary prevention. Needs therapy for hypertensive BP response The patient exercised according to the Bruce   protocol, Total time recorded    5    Min.    32    sec.  achieving a max heart rate of 144  which was  94% of MPHR for age and  7.3 METS of work. Baseline NIBP was 148/74. Peak NIBP was 220/82 MaxSysp was: 240 MaxDiasp was: 82.  Echocardiogram 01/16/2018: Left ventricle cavity is normal in size. Normal global wall motion. Normal diastolic filling pattern. Calculated EF 56%. Mild (Grade I) mitral regurgitation. Mild to moderate tricuspid regurgitation. Estimated pulmonary artery systolic pressure 34  mmHg. No significant change compared to previous study in 2015.   Lexiscan myoview stress test 12/28/2017:  1. Lexiscan stress test was performed in addition to modified Bruce protocol. The patient performed modified Bruce protocol, reaching 71% of the maximum predicted heart rate. Normal hemodynamic response with limited exercise. Stress symptoms included dizziness. The resting electrocardiogram demonstrated normal sinus rhythm, normal resting conduction, no resting arrhythmias and nonspecific inferior ST-T changes.  Stress  EKG is non diagnostic for ischemia as it is a pharmacologic stress. Stress electrocardiogram with modified Bruce protocol showed sinus tachycardia 106 bpm, normal stress conduction, no stress arrhythmias and nonspecific inferior ST-T changes.   2. The overall quality of the study is good. There is no evidence of abnormal lung activity. Stress and rest SPECT images demonstrate homogeneous tracer distribution throughout the myocardium. Gated SPECT imaging reveals normal myocardial thickening and wall motion. The left ventricular ejection fraction was normal (64%).   3. Low risk study.  EKG:   EKG 11/10/2020: Normal sinus rhythm at the rate of 75 bpm, left atrial enlargement, normal axis, frequent PACs in bigeminal pattern.  Voltage criteria for LVH.    EKG 08/08/2013: Normal sinus rhythm, PACs.  IRBBB. Otherwise normal EKG.  Assessment     ICD-10-CM   1. Palpitations  R00.2 EKG 12-Lead    metoprolol succinate (TOPROL-XL) 50 MG 24 hr tablet    2. PAC (premature atrial contraction)  I49.1     3. PVC (premature ventricular contraction)  I49.3     4. Dyspnea on exertion  R06.00 PCV ECHOCARDIOGRAM COMPLETE    5. Primary hypertension  I10 metoprolol succinate (TOPROL-XL) 50 MG 24 hr tablet      Medications Discontinued During This Encounter  Medication Reason   terbinafine (LAMISIL) 250 MG tablet Error   FLOVENT HFA 110 MCG/ACT inhaler Error   NON FORMULARY Error    Meds ordered this encounter  Medications   metoprolol succinate (TOPROL-XL) 50 MG 24 hr tablet    Sig: Take 1 tablet (50 mg total) by mouth daily. Take with or immediately following a meal.    Dispense:  30 tablet    Refill:  2    Orders Placed This Encounter  Procedures   EKG 12-Lead   PCV ECHOCARDIOGRAM COMPLETE    Standing Status:   Future    Standing Expiration Date:   11/10/2021   Recommendations:   Sarah Underwood is a 75 y.o. Caucasian female with a past medical history significant for HTN, mild mitral  regurgitation, mild to moderate tricuspid regurgitation, mild pulmonary hypertension, Graves disease, asthma, and breast cancer.   Patient presents today for evaluation of palpitations that began roughly 5 weeks ago. She states that she has a history of PVCs but these palpitations "seem different."   EKG today showed frequent PACs in a bigeminal pattern as a  cause of the patient's palpitations. Due to the patient's palpitations and uncontrolled HTN, the patient was started on Metoprolol succinate 50 mg for palpitations and BP control.   EKG also showed LVH and patient has not had an echo since 01/16/2018, so an echo was ordered.  Follow up in 6 weeks for palpitations, HTN, and echocardiogram results.     Summer Lackey PA-S 11/10/2020, 5:57 PM Office: [phone]  Patient seen and examined in conjunction with Ms. Adline Potter, [place] second year Ship broker at [employer].  Time spent is in direct patient face to face encounter not including the teaching and training involved.    Adrian Prows, MD, [facility] 11/10/2020, 5:59 PM Office: [phone]

## 2020-11-12 ENCOUNTER — Ambulatory Visit: Payer: Medicare PPO

## 2020-11-12 ENCOUNTER — Other Ambulatory Visit: Payer: Self-pay

## 2020-11-12 DIAGNOSIS — R0609 Other forms of dyspnea: Secondary | ICD-10-CM | POA: Diagnosis not present

## 2020-11-12 DIAGNOSIS — R0600 Dyspnea, unspecified: Secondary | ICD-10-CM

## 2020-11-16 DIAGNOSIS — I10 Essential (primary) hypertension: Secondary | ICD-10-CM | POA: Diagnosis not present

## 2020-11-16 DIAGNOSIS — E1165 Type 2 diabetes mellitus with hyperglycemia: Secondary | ICD-10-CM | POA: Diagnosis not present

## 2020-11-16 DIAGNOSIS — E0500 Thyrotoxicosis with diffuse goiter without thyrotoxic crisis or storm: Secondary | ICD-10-CM | POA: Diagnosis not present

## 2020-11-16 DIAGNOSIS — E559 Vitamin D deficiency, unspecified: Secondary | ICD-10-CM | POA: Diagnosis not present

## 2020-11-23 DIAGNOSIS — N281 Cyst of kidney, acquired: Secondary | ICD-10-CM | POA: Diagnosis not present

## 2020-11-23 DIAGNOSIS — J45909 Unspecified asthma, uncomplicated: Secondary | ICD-10-CM | POA: Diagnosis not present

## 2020-11-23 DIAGNOSIS — R413 Other amnesia: Secondary | ICD-10-CM | POA: Diagnosis not present

## 2020-11-23 DIAGNOSIS — R0600 Dyspnea, unspecified: Secondary | ICD-10-CM | POA: Diagnosis not present

## 2020-11-23 DIAGNOSIS — Z1331 Encounter for screening for depression: Secondary | ICD-10-CM | POA: Diagnosis not present

## 2020-11-23 DIAGNOSIS — R002 Palpitations: Secondary | ICD-10-CM | POA: Diagnosis not present

## 2020-11-23 DIAGNOSIS — I10 Essential (primary) hypertension: Secondary | ICD-10-CM | POA: Diagnosis not present

## 2020-11-23 DIAGNOSIS — Z0000 Encounter for general adult medical examination without abnormal findings: Secondary | ICD-10-CM | POA: Diagnosis not present

## 2020-11-23 DIAGNOSIS — Z1389 Encounter for screening for other disorder: Secondary | ICD-10-CM | POA: Diagnosis not present

## 2020-11-23 DIAGNOSIS — I493 Ventricular premature depolarization: Secondary | ICD-10-CM | POA: Diagnosis not present

## 2020-11-23 DIAGNOSIS — K8080 Other cholelithiasis without obstruction: Secondary | ICD-10-CM | POA: Diagnosis not present

## 2020-11-25 DIAGNOSIS — Z1212 Encounter for screening for malignant neoplasm of rectum: Secondary | ICD-10-CM | POA: Diagnosis not present

## 2020-11-29 ENCOUNTER — Ambulatory Visit (HOSPITAL_BASED_OUTPATIENT_CLINIC_OR_DEPARTMENT_OTHER): Payer: Medicare PPO | Admitting: Obstetrics & Gynecology

## 2020-12-04 ENCOUNTER — Other Ambulatory Visit: Payer: Self-pay | Admitting: Oncology

## 2020-12-04 DIAGNOSIS — C50511 Malignant neoplasm of lower-outer quadrant of right female breast: Secondary | ICD-10-CM

## 2020-12-09 ENCOUNTER — Other Ambulatory Visit: Payer: Self-pay | Admitting: Anesthesiology

## 2020-12-09 DIAGNOSIS — D4102 Neoplasm of uncertain behavior of left kidney: Secondary | ICD-10-CM

## 2020-12-09 DIAGNOSIS — N281 Cyst of kidney, acquired: Secondary | ICD-10-CM

## 2020-12-21 ENCOUNTER — Encounter: Payer: Self-pay | Admitting: Cardiology

## 2020-12-21 ENCOUNTER — Other Ambulatory Visit: Payer: Self-pay

## 2020-12-21 ENCOUNTER — Ambulatory Visit: Payer: Medicare PPO | Admitting: Cardiology

## 2020-12-21 VITALS — BP 153/77 | HR 69 | Temp 98.2°F | Resp 16 | Ht 63.0 in | Wt 154.4 lb

## 2020-12-21 DIAGNOSIS — I10 Essential (primary) hypertension: Secondary | ICD-10-CM

## 2020-12-21 DIAGNOSIS — R002 Palpitations: Secondary | ICD-10-CM

## 2020-12-21 DIAGNOSIS — I493 Ventricular premature depolarization: Secondary | ICD-10-CM | POA: Diagnosis not present

## 2020-12-21 DIAGNOSIS — I491 Atrial premature depolarization: Secondary | ICD-10-CM

## 2020-12-21 MED ORDER — AMILORIDE HCL 5 MG PO TABS: 5.0000 mg | ORAL_TABLET | Freq: Every morning | ORAL | 3 refills | Status: DC

## 2020-12-21 MED ORDER — METOPROLOL SUCCINATE ER 50 MG PO TB24: 50.0000 mg | ORAL_TABLET | Freq: Every day | ORAL | 3 refills | Status: DC

## 2020-12-21 NOTE — Progress Notes (Signed)
Primary Physician/Referring:  Velna Hatchet, MD  Patient ID: Sarah Underwood, female    DOB: 05/22/1945, 75 y.o.   MRN: [id number]  Chief Complaint  Patient presents with   Palpitations   Hypertension   Results    Echocardiogram    HPI:    Sarah Underwood  is a 75 y.o. Caucasian female with a past medical history significant for HTN, mild mitral regurgitation, mild to moderate tricuspid regurgitation, mild pulmonary hypertension, Graves disease, asthma, and breast cancer. She was seen by me on 11/10/2020 for palpitations that started about 6 weeks ago.  EKG had shown frequent PACs in bigeminal pattern, uncontrolled hypertension and LVH by EKG.  She was started on metoprolol succinate and she now presents for follow-up.  Since starting metoprolol, her symptoms of palpitations are completely subsided with very minimal symptoms occasionally.  She is still concerned about elevated blood pressure  Past Medical History:  Diagnosis Date   Basal cell carcinoma 1994   right side of node   Benign positional vertigo    with rolling over    Dysrhythmia    Family history of adverse reaction to anesthesia    pts mother had postop vomiting    Generalized anxiety disorder    GERD (gastroesophageal reflux disease)    Graves disease 2007   thyroid nodule   Hard of hearing    Heart murmur    History of breast cancer 1995   left - treated with mastectomy   History of radiation therapy 2001   37 sessions following recurrence of breast cancer in left chest wall   Hypertension    Knee pain, bilateral 12/14/2014   Major depressive disorder in remission    Mild persistent asthma, uncomplicated 05/27/6331   Nocturia    Numbness and tingling    bilat more per right hand   OA (osteoarthritis) of hip 09/08/2015   Ovarian mass    small solid nodule 8x40m: Right ovary - stable since 2000   PONV (postoperative nausea and vomiting)    pt states occurred with pain meds questions darvocet    Seasonal  allergies    Stress fracture of metatarsal bone of left foot 12/15/2019   Past Surgical History:  Procedure Laterality Date   APPENDECTOMY     basal cell removal  7/94   right nose   BREAST BIOPSY Right 1987   benign fibroadenoma   breast cancer recurrence Left 01/2000   at site of left chest wall after previous mastectomy with removal and treatment with radiaton.   CARPAL TUNNEL RELEASE Right 05/08/2013   Procedure: RIGHT CARPAL TUNNEL RELEASE;  Surgeon: RCammie Sickle, MD;  Location: [facility]  Service: Orthopedics;  Laterality: Right;   CATARACT EXTRACTION     03/20/14 left eye, 12/15 right eye   COLPOSCOPY W/ BIOPSY / CURETTAGE  03/12/12   benign, but + HR HPV   EYE SURGERY     bilat cataract surgery    MASTECTOMY Left 3/95   Stage IIb 0/14 LN ER/ PR + with mastectomy - Dr. LMendel Ryder  MOHS SURGERY     right nare   OB[facility]erRight 09/08/2015   Procedure: TOTAL HIP ARTHROPLASTY ANTERIOR APPROACH;  Surgeon: FGaynelle Arabian MD;  Location: [facility];  Service: Orthopedics;  Laterality: Right;   Family History  Problem Relation Age of Onset   Breast cancer  Mother 75       deceased 30   Osteoporosis Mother    Hypertension Mother    Dementia Mother        unspecified type   Mental illness Mother    Hypertension Father    Heart attack Father    Dementia Father        vascular dementia with history of "mini-strokes"   Breast cancer Sister 76       bilateral; recurrent 27 years late   Diabetes Brother    Lung cancer Maternal Grandfather        deceased 35   Cancer Paternal Grandmother        duodenal; deceased 68    Social History   Tobacco Use   Smoking status: Never   Smokeless tobacco: Never  Substance Use Topics   Alcohol use: Yes    Comment: rare   Marital Status: Divorced  ROS  Review of Systems  Cardiovascular:  Positive for leg swelling (mild ankle) and  palpitations (improved). Negative for chest pain and dyspnea on exertion.  Respiratory:  Negative for shortness of breath.   Neurological:  Negative for dizziness.  Psychiatric/Behavioral:  The patient is nervous/anxious (due to concern about palpitations).   Objective  Blood pressure (!) 153/77, pulse 69, temperature 98.2 F (36.8 C), temperature source Temporal, resp. rate 16, height '5\' 3"'  (1.6 m), weight 154 lb 6.4 oz (70 kg), last menstrual period 04/25/2003, SpO2 97 %. Body mass index is 27.35 kg/m.  Vitals with BMI 12/21/2020 11/10/2020 11/10/2020  Height '5\' 3"'  - '5\' 3"'   Weight 154 lbs 6 oz - 155 lbs 6 oz  BMI 52.84 - 13.24  Systolic 401 027 253  Diastolic 77 88 76  Pulse 69 67 75     Physical Exam Neck:     Vascular: No carotid bruit or JVD.  Cardiovascular:     Rate and Rhythm: Normal rate. Rhythm regularly irregular. No extrasystoles are present.    Pulses: Intact distal pulses.     Heart sounds: Normal heart sounds. No murmur heard.   No gallop.  Pulmonary:     Effort: Pulmonary effort is normal.     Breath sounds: Normal breath sounds.  Abdominal:     General: Bowel sounds are normal.     Palpations: Abdomen is soft.     Tenderness: There is no abdominal tenderness.  Musculoskeletal:     Right lower leg: No edema.     Left lower leg: No edema.  Neurological:     Mental Status: She is alert.     Laboratory examination:   Recent Labs    04/09/20 1956  NA 138  K 4.4  CL 102  CO2 28  GLUCOSE 97  BUN 14  CREATININE 0.79  CALCIUM 9.3  GFRNONAA >60   CrCl cannot be calculated (Patient's most recent lab result is older than the maximum 21 days allowed.).  CMP Latest Ref Rng & Units 04/09/2020 09/10/2015 09/09/2015  Glucose 70 - 99 mg/dL 97 125(H) 178(H)  BUN 8 - 23 mg/dL '14 8 9  ' Creatinine 0.44 - 1.00 mg/dL 0.79 0.68 0.73  Sodium 135 - 145 mmol/L 138 137 135  Potassium 3.5 - 5.1 mmol/L 4.4 3.9 4.5  Chloride 98 - 111 mmol/L 102 102 106  CO2 22 - 32 mmol/L '28  28 24  ' Calcium 8.9 - 10.3 mg/dL 9.3 8.8(L) 8.5(L)  Total Protein 6.5 - 8.1 g/dL 7.3 - -  Total Bilirubin 0.3 - 1.2  mg/dL 0.7 - -  Alkaline Phos 38 - 126 U/L 72 - -  AST 15 - 41 U/L 19 - -  ALT 0 - 44 U/L 17 - -   CBC Latest Ref Rng & Units 04/09/2020 09/10/2015 09/09/2015  WBC 4.0 - 10.5 K/uL 7.3 17.1(H) 12.8(H)  Hemoglobin 12.0 - 15.0 g/dL 13.8 11.3(L) 11.4(L)  Hematocrit 36.0 - 46.0 % 42.6 33.0(L) 33.3(L)  Platelets 150 - 400 K/uL 316 305 282    Lipid Panel No results for input(s): CHOL, TRIG, LDLCALC, VLDL, HDL, CHOLHDL, LDLDIRECT in the last 8760 hours. Lipid Panel  No results found for: CHOL, TRIG, HDL, CHOLHDL, VLDL, LDLCALC, LDLDIRECT, LABVLDL   HEMOGLOBIN A1C No results found for: HGBA1C, MPG TSH No results for input(s): TSH in the last 8760 hours.  External labs:   Labs 11/03/2019:  TSH normal.  Vitamin D normal at 55.1.  A1c 5.6%.  Serum glucose 90 mg, BUN 15, creatinine 0.8, EGFR 70 mL, potassium 4.2.  CBC normal with a hemoglobin of 13.5 and platelet count of 274.  Total cholesterol 162, triglycerides 41, HDL 67, LDL 87.  Non-HDL cholesterol 95.  Medications and allergies   Allergies  Allergen Reactions   Other Swelling    Trees   Molds & Smuts Other (See Comments)   Erythromycin Nausea And Vomiting   Penicillins Hives and Other (See Comments)    Has patient had a PCN reaction causing immediate rash, facial/tongue/throat swelling, SOB or lightheadedness with hypotension: no Has patient had a PCN reaction causing severe rash involving mucus membranes or skin necrosis: no Has patient had a PCN reaction that required hospitalization no Has patient had a PCN reaction occurring within the last 10 years: no If all of the above answers are "NO", then may proceed with Cephalosporin use.    Terbinafine And Related     rash   Hydrochlorothiazide Rash    Pt states caused a sunburn type reaction    Sulfa Antibiotics Rash     Medication prior to this encounter:    Outpatient Medications Prior to Visit  Medication Sig Dispense Refill   acetaminophen (TYLENOL) 650 MG CR tablet Take 1,300 mg by mouth as needed.      albuterol (PROVENTIL HFA;VENTOLIN HFA) 108 (90 Base) MCG/ACT inhaler Inhale 2 puffs into the lungs every 4 (four) hours as needed for wheezing. 1 Inhaler 3   amLODipine (NORVASC) 2.5 MG tablet Take 1 tablet by mouth daily.     anastrozole (ARIMIDEX) 1 MG tablet TAKE 1 TABLET BY MOUTH EVERY DAY 90 tablet 3   Artificial Tear Solution (TEARS NATURALE OP) Place 2 drops into both eyes daily as needed (For dry eyes.).     Biotin 5000 MCG SUBL Take 1 tablet by mouth See admin instructions.     budesonide-formoterol (SYMBICORT) 80-4.5 MCG/ACT inhaler Inhale 2 puffs into the lungs 2 (two) times daily.     Cetirizine HCl (ZYRTEC ALLERGY PO) Take by mouth as needed.      EPINEPHrine 0.3 mg/0.3 mL IJ SOAJ injection See admin instructions.     ketotifen (ZADITOR) 0.025 % ophthalmic solution Place 1 drop into both eyes daily.     MAGNESIUM CITRATE PO Take 1 tablet by mouth 2 (two) times daily.     Menaquinone-7 (VITAMIN K2) 100 MCG CAPS Take by mouth.     Multiple Vitamin (MULTIVITAMIN) tablet Take 1 tablet by mouth daily.     olmesartan (BENICAR) 40 MG tablet      VITAMIN D  PO Take 4,000 Int'l Units by mouth.     metoprolol succinate (TOPROL-XL) 50 MG 24 hr tablet Take 1 tablet (50 mg total) by mouth daily. Take with or immediately following a meal. 30 tablet 2   calcium-vitamin D 250-100 MG-UNIT tablet Take 1 tablet by mouth 2 (two) times daily.     cimetidine (TAGAMET) 200 MG tablet Take 200 mg by mouth as needed (if taking starts with 2 tablets daily and weans down).     ibuprofen (ADVIL,MOTRIN) 600 MG tablet Take 600 mg by mouth every 6 (six) hours as needed.     No facility-administered medications prior to visit.     Medication list after today's encounter   Current Outpatient Medications  Medication Instructions   acetaminophen (TYLENOL)  1,300 mg, Oral, As needed   albuterol (PROVENTIL HFA;VENTOLIN HFA) 108 (90 Base) MCG/ACT inhaler 2 puffs, Inhalation, Every 4 hours PRN   aMILoride (MIDAMOR) 5 mg, Oral, Every morning, After breakfast   amLODipine (NORVASC) 2.5 MG tablet 1 tablet, Oral, Daily   anastrozole (ARIMIDEX) 1 MG tablet TAKE 1 TABLET BY MOUTH EVERY DAY   Artificial Tear Solution (TEARS NATURALE OP) 2 drops, Both Eyes, Daily PRN   Biotin 5000 MCG SUBL 1 tablet, Oral, See admin instructions   budesonide-formoterol (SYMBICORT) 80-4.5 MCG/ACT inhaler 2 puffs, Inhalation, 2 times daily   Cetirizine HCl (ZYRTEC ALLERGY PO) Oral, As needed   EPINEPHrine 0.3 mg/0.3 mL IJ SOAJ injection See admin instructions   ketotifen (ZADITOR) 0.025 % ophthalmic solution 1 drop, Both Eyes, Daily   MAGNESIUM CITRATE PO 1 tablet, Oral, 2 times daily   Menaquinone-7 (VITAMIN K2) 100 MCG CAPS Oral   metoprolol succinate (TOPROL-XL) 50 mg, Oral, Daily, Take with or immediately following a meal.   Multiple Vitamin (MULTIVITAMIN) tablet 1 tablet, Oral, Daily   olmesartan (BENICAR) 40 MG tablet No dose, route, or frequency recorded.   VITAMIN D PO 4,000 Int'l Units, Oral    Radiology:   No results found.  Cardiac Studies:   Stress EKG 08/20/2013: Indications: Palpitations.  Conclusions: Negative for ischemia. Hypertensive.  The baseline ECG showed NSR,LVH. PAC frequent. During exercise there was no ST-T changes of ischemia. Symptoms: Dyspnea. THR met. Arrhythmia: PAC decreased with exercise.  Continue primary prevention. Needs therapy for hypertensive BP response The patient exercised according to the Bruce   protocol, Total time recorded    5    Min.    32    sec.  achieving a max heart rate of 144  which was  94% of MPHR for age and  7.3 METS of work. Baseline NIBP was 148/74. Peak NIBP was 220/82 MaxSysp was: 240 MaxDiasp was: 82.  Lexiscan myoview stress test 12/28/2017:  1. Lexiscan stress test was performed in addition to modified  Bruce protocol. The patient performed modified Bruce protocol, reaching 71% of the maximum predicted heart rate. Normal hemodynamic response with limited exercise. Stress symptoms included dizziness. The resting electrocardiogram demonstrated normal sinus rhythm, normal resting conduction, no resting arrhythmias and nonspecific inferior ST-T changes.  Stress EKG is non diagnostic for ischemia as it is a pharmacologic stress. Stress electrocardiogram with modified Bruce protocol showed sinus tachycardia 106 bpm, normal stress conduction, no stress arrhythmias and nonspecific inferior ST-T changes.   2. The overall quality of the study is good. There is no evidence of abnormal lung activity. Stress and rest SPECT images demonstrate homogeneous tracer distribution throughout the myocardium. Gated SPECT imaging reveals normal myocardial thickening and wall motion. The  left ventricular ejection fraction was normal (64%).   3. Low risk study.  PCV ECHOCARDIOGRAM COMPLETE 05/69/7948 Normal LV systolic function with visual EF 60-65%. Left ventricle cavity is normal in size. Normal global wall motion. Indeterminate diastolic filling pattern, normal LAP. Left atrial cavity is mildly dilated. Mild (Grade I) mitral regurgitation. Mild tricuspid regurgitation. Mild pulmonary hypertension. RVSP measures 40 mmHg. IVC is dilated with a respiratory response of <50%. Compared to study 01/16/2018: no significant change.    EKG:   EKG 11/10/2020: Normal sinus rhythm at the rate of 75 bpm, left atrial enlargement, normal axis, frequent PACs in bigeminal pattern.  Voltage criteria for LVH.    EKG 08/08/2013: Normal sinus rhythm, PACs.  IRBBB. Otherwise normal EKG.  Assessment     ICD-10-CM   1. Palpitations  R00.2 metoprolol succinate (TOPROL-XL) 50 MG 24 hr tablet    2. Primary hypertension  I10 metoprolol succinate (TOPROL-XL) 50 MG 24 hr tablet    aMILoride (MIDAMOR) 5 MG tablet    Basic metabolic panel     Basic metabolic panel    3. PVC (premature ventricular contraction)  I49.3     4. PAC (premature atrial contraction)  I49.1       Medications Discontinued During This Encounter  Medication Reason   calcium-vitamin D 250-100 MG-UNIT tablet Error   cimetidine (TAGAMET) 200 MG tablet Error   ibuprofen (ADVIL,MOTRIN) 600 MG tablet Error   metoprolol succinate (TOPROL-XL) 50 MG 24 hr tablet Reorder    Meds ordered this encounter  Medications   metoprolol succinate (TOPROL-XL) 50 MG 24 hr tablet    Sig: Take 1 tablet (50 mg total) by mouth daily. Take with or immediately following a meal.    Dispense:  90 tablet    Refill:  3   aMILoride (MIDAMOR) 5 MG tablet    Sig: Take 1 tablet (5 mg total) by mouth in the morning. After breakfast    Dispense:  90 tablet    Refill:  3    Orders Placed This Encounter  Procedures   Basic metabolic panel    Standing Status:   Future    Number of Occurrences:   1    Standing Expiration Date:   12/21/2021    Recommendations:   CERA RORKE is a 75 y.o. She was seen by me on 11/10/2020 for palpitations that started about 6 weeks ago.  EKG had shown frequent PACs in bigeminal pattern, uncontrolled hypertension and LVH by EKG.  She was started on metoprolol succinate and she now presents for follow-up.  Since starting metoprolol, her symptoms of palpitations are completely subsided with very minimal symptoms occasionally.  She is still concerned about elevated blood pressure, I will start her on amiloride 5 mg daily in the morning, obtain BMP in 3 to 4 weeks.  Advised her not to eat excessive amounts of banana that she consumes to keep up her potassium levels up.  I would like to see her back in 2 months for follow-up of hypertension and also palpitations along with follow-up of the labs.  If she remains stable she could potentially be made as needed unless she wants to continue to follow-up with [place].  This time she requested that she come back to see  [place].  No other tests were ordered today.    Adrian Prows, MD, [facility] 12/21/2020, 4:03 PM Office: [phone]

## 2020-12-26 ENCOUNTER — Ambulatory Visit (HOSPITAL_COMMUNITY)
Admission: EM | Admit: 2020-12-26 | Discharge: 2020-12-26 | Disposition: A | Discharge status: Home / Self Care | Payer: Medicare PPO | Attending: Emergency Medicine | Admitting: Emergency Medicine

## 2020-12-26 ENCOUNTER — Ambulatory Visit (INDEPENDENT_AMBULATORY_CARE_PROVIDER_SITE_OTHER): Payer: Medicare PPO

## 2020-12-26 ENCOUNTER — Encounter (HOSPITAL_COMMUNITY): Payer: Self-pay

## 2020-12-26 DIAGNOSIS — W19XXXA Unspecified fall, initial encounter: Secondary | ICD-10-CM

## 2020-12-26 DIAGNOSIS — S40022A Contusion of left upper arm, initial encounter: Secondary | ICD-10-CM

## 2020-12-26 DIAGNOSIS — M85832 Other specified disorders of bone density and structure, left forearm: Secondary | ICD-10-CM | POA: Diagnosis not present

## 2020-12-26 DIAGNOSIS — M79632 Pain in left forearm: Secondary | ICD-10-CM

## 2020-12-26 DIAGNOSIS — Z043 Encounter for examination and observation following other accident: Secondary | ICD-10-CM | POA: Diagnosis not present

## 2020-12-26 IMAGING — DX DG FOREARM 2V*L*
2 series · 2 of 2 positions shown · non-contrast
Comparison: None.

CLINICAL DATA: Fell

EXAM:
LEFT FOREARM - 2 VIEW

[forearm ap]
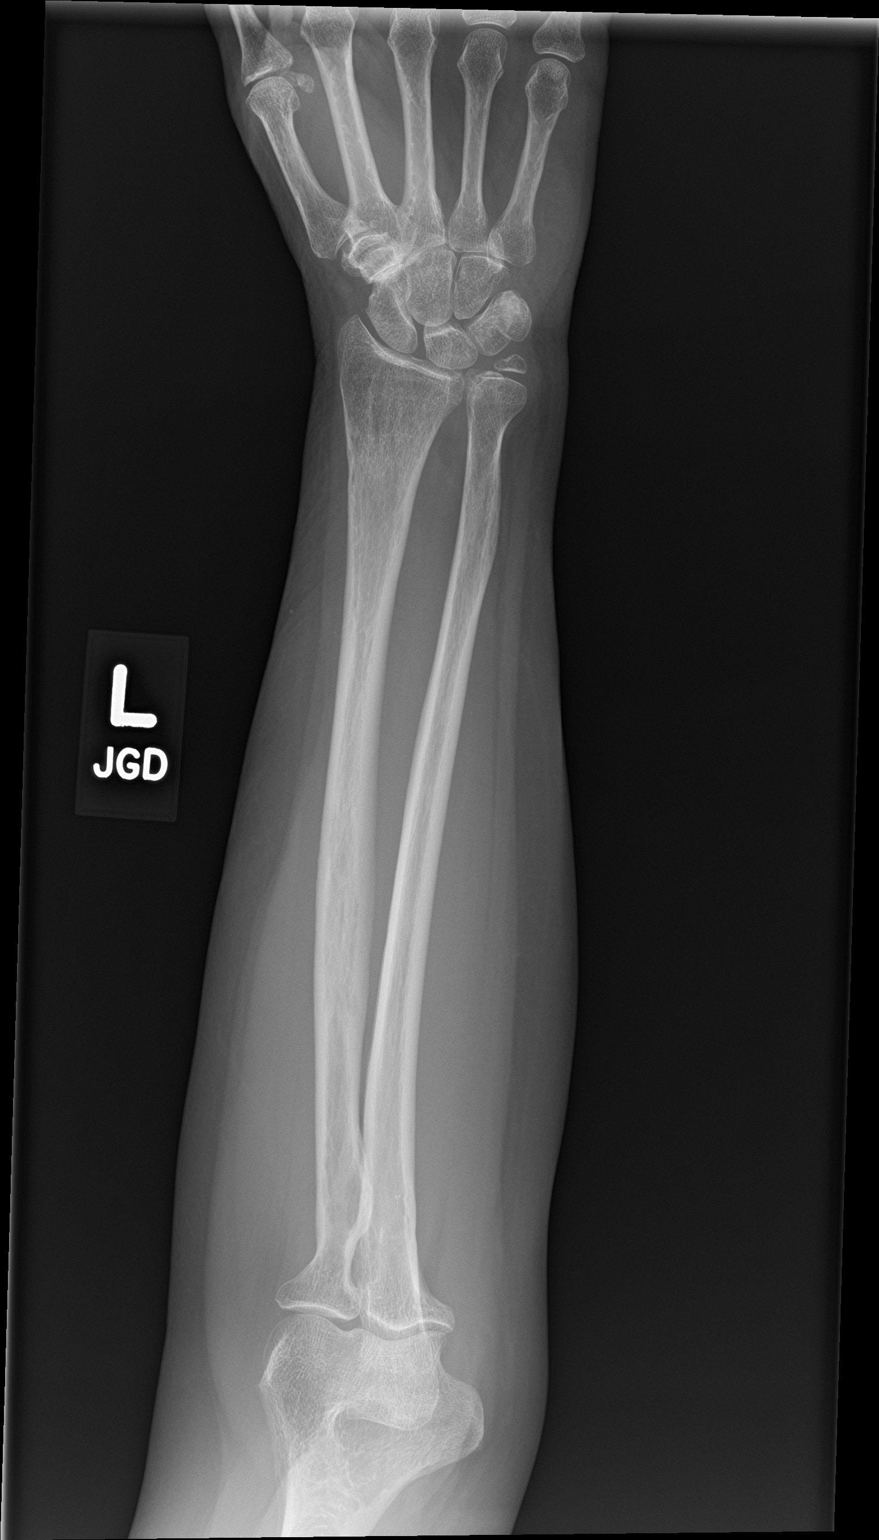

[forearm lat]
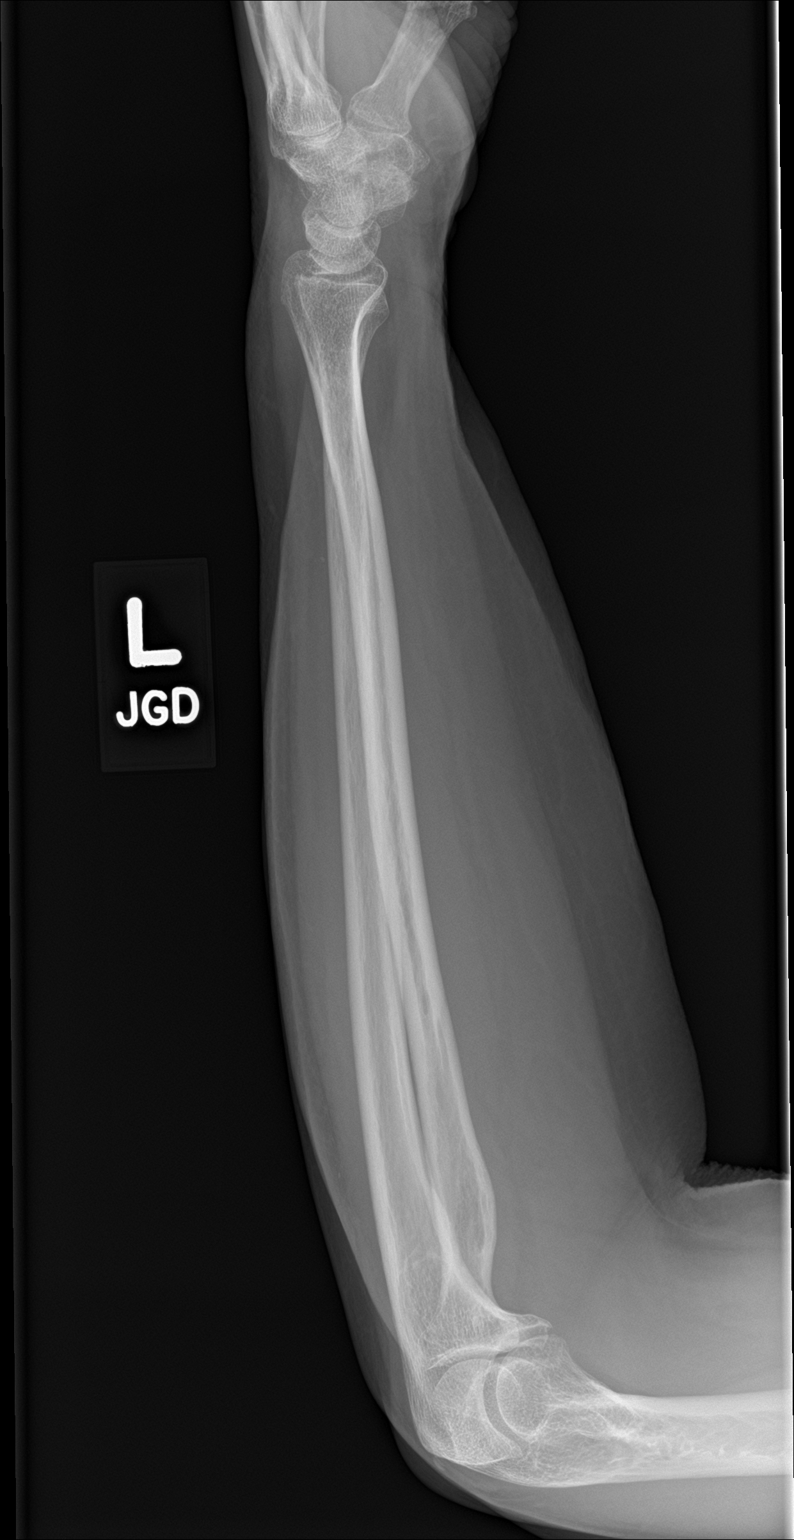

[2 of 2 positions shown; findings below may reference images not displayed]

FINDINGS: Corticated ossicle in the expected location of the ulnar styloid
suggesting remote fracture. No acute fracture or dislocation. Carpal
rows intact. Mild osteopenia.
IMPRESSION: No acute findings.

## 2020-12-26 NOTE — Discharge Instructions (Addendum)
You can take Tylenol and/or Ibuprofen as needed for pain relief and fever reduction.   You can wear an ace bandage or compression bandage as needed for comfort.   Rest as much as possible Ice for 10-15 minutes every 4-6 hours as needed for pain and swelling Compression- use an ace bandage or splint for comfort Elevate above your hip/heart when sitting and laying down  Follow up with sports medicine or orthopedics if symptoms do not improve in the next week.

## 2020-12-26 NOTE — ED Triage Notes (Signed)
Pt presents with left arm injury after hitting on a door frame in her arm when she fell this morning.

## 2020-12-26 NOTE — ED Provider Notes (Signed)
[place]    CSN: [id number] Arrival date & time: 12/26/20  1303      History   Chief Complaint Chief Complaint  Patient presents with   Fall    HPI Sarah Underwood is a 75 y.o. female.   Patient here for evaluation of left arm pain after hitting her arm on a door frame this morning.  Patient reports having a mechanical fall and caught herself with her left forearm on the door frame.  Reports some tenderness when she presses on it but no significant decreased range of motion.  Reports icing it at home which has helped with bruising and swelling.  Denies any fevers, chest pain, shortness of breath, N/V/D, numbness, tingling, weakness, abdominal pain, or headaches.    The history is provided by the patient.  Fall   Past Medical History:  Diagnosis Date   Basal cell carcinoma 1994   right side of node   Benign positional vertigo    with rolling over    Dysrhythmia    Family history of adverse reaction to anesthesia    pts mother had postop vomiting    Generalized anxiety disorder    GERD (gastroesophageal reflux disease)    Graves disease 2007   thyroid nodule   Hard of hearing    Heart murmur    History of breast cancer 1995   left - treated with mastectomy   History of radiation therapy 2001   37 sessions following recurrence of breast cancer in left chest wall   Hypertension    Knee pain, bilateral 12/14/2014   Major depressive disorder in remission    Mild persistent asthma, uncomplicated [id number]   Nocturia    Numbness and tingling    bilat more per right hand   OA (osteoarthritis) of hip 09/08/2015   Ovarian mass    small solid nodule 8x21m: Right ovary - stable since 2000   PONV (postoperative nausea and vomiting)    pt states occurred with pain meds questions darvocet    Seasonal allergies    Stress fracture of metatarsal bone of left foot 12/15/2019    Patient Active Problem List   Diagnosis Date Noted   Major depressive disorder in  remission 03/29/2020   Generalized anxiety disorder    Benign positional vertigo    Hypertension    Heart murmur    Stress fracture of metatarsal bone of left foot 12/15/2019   History of revision of total replacement of right hip joint 11/26/2017   Leg edema, left 09/26/2016   Post-nasal drainage 03/30/2016   OA (osteoarthritis) of hip 09/08/2015   Mild persistent asthma, uncomplicated [id number]  Hip pain, bilateral 12/14/2014   Knee pain, bilateral 12/14/2014   Postmenopausal atrophic vaginitis 02/23/2013   Graves disease 2007   History of radiation therapy 2001   History of breast cancer 1995   Basal cell carcinoma 1994    Past Surgical History:  Procedure Laterality Date   APPENDECTOMY     basal cell removal  7/94   right nose   BREAST BIOPSY Right 1987   benign fibroadenoma   breast cancer recurrence Left 01/2000   at site of left chest wall after previous mastectomy with removal and treatment with radiaton.   CARPAL TUNNEL RELEASE Right 05/08/2013   Procedure: RIGHT CARPAL TUNNEL RELEASE;  Surgeon: RCammie Sickle, MD;  Location: [facility]  Service: Orthopedics;  Laterality: Right;   CATARACT EXTRACTION     03/20/14  left eye, 12/15 right eye   COLPOSCOPY W/ BIOPSY / CURETTAGE  03/12/12   benign, but + HR HPV   EYE SURGERY     bilat cataract surgery    MASTECTOMY Left 3/95   Stage IIb 0/14 LN ER/ PR + with mastectomy - Dr. Mendel Ryder   MOHS SURGERY     right nare   [place] Right 09/08/2015   Procedure: TOTAL HIP ARTHROPLASTY ANTERIOR APPROACH;  Surgeon: Gaynelle Arabian, MD;  Location: [facility];  Service: Orthopedics;  Laterality: Right;    OB History     Gravida  1   Para  1   Term  1   Preterm      AB      Living  1      SAB      IAB      Ectopic      Multiple      Live Births               Home Medications    Prior to Admission  medications   Medication Sig Start Date End Date Taking? Authorizing Provider  acetaminophen (TYLENOL) 650 MG CR tablet Take 1,300 mg by mouth as needed.     [provider]  albuterol (PROVENTIL HFA;VENTOLIN HFA) 108 (90 Base) MCG/ACT inhaler Inhale 2 puffs into the lungs every 4 (four) hours as needed for wheezing. 10/30/17   Brand Males, MD  aMILoride (MIDAMOR) 5 MG tablet Take 1 tablet (5 mg total) by mouth in the morning. After breakfast 12/21/20   Adrian Prows, MD  amLODipine (NORVASC) 2.5 MG tablet Take 1 tablet by mouth daily. 07/20/18   [provider]  anastrozole (ARIMIDEX) 1 MG tablet TAKE 1 TABLET BY MOUTH EVERY DAY 12/07/20   Ladell Pier, MD  Artificial Tear Solution (TEARS NATURALE OP) Place 2 drops into both eyes daily as needed (For dry eyes.).    [provider]  Biotin 5000 MCG SUBL Take 1 tablet by mouth See admin instructions.    [provider]  budesonide-formoterol (SYMBICORT) 80-4.5 MCG/ACT inhaler Inhale 2 puffs into the lungs 2 (two) times daily.    [provider]  Cetirizine HCl (ZYRTEC ALLERGY PO) Take by mouth as needed.     [provider]  EPINEPHrine 0.3 mg/0.3 mL IJ SOAJ injection See admin instructions.    [provider]  ketotifen (ZADITOR) 0.025 % ophthalmic solution Place 1 drop into both eyes daily.    [provider]  MAGNESIUM CITRATE PO Take 1 tablet by mouth 2 (two) times daily.    [provider]  Menaquinone-7 (VITAMIN K2) 100 MCG CAPS Take by mouth.    [provider]  metoprolol succinate (TOPROL-XL) 50 MG 24 hr tablet Take 1 tablet (50 mg total) by mouth daily. Take with or immediately following a meal. 12/21/20 12/16/21  Adrian Prows, MD  Multiple Vitamin (MULTIVITAMIN) tablet Take 1 tablet by mouth daily.    [provider]  olmesartan (BENICAR) 40 MG tablet  11/27/19   [provider]  VITAMIN D PO Take 4,000 Int'l Units by mouth.     [provider]    Family History Family History  Problem Relation Age of Onset   Breast cancer Mother 57       deceased 16   Osteoporosis Mother    Hypertension Mother  Dementia Mother        unspecified type   Mental illness Mother    Hypertension Father    Heart attack Father    Dementia Father        vascular dementia with history of "mini-strokes"   Breast cancer Sister 38       bilateral; recurrent 40 years late   Diabetes Brother    Lung cancer Maternal Grandfather        deceased 28   Cancer Paternal Grandmother        duodenal; deceased 46    Social History Social History   Tobacco Use   Smoking status: Never   Smokeless tobacco: Never  Vaping Use   Vaping Use: Never used  Substance Use Topics   Alcohol use: Yes    Comment: rare   Drug use: No     Allergies   Other, Molds & smuts, Erythromycin, Penicillins, Terbinafine and related, Hydrochlorothiazide, and Sulfa antibiotics   Review of Systems Review of Systems  Musculoskeletal:  Positive for arthralgias and joint swelling.  All other systems reviewed and are negative.   Physical Exam Triage Vital Signs ED Triage Vitals  Enc Vitals Group     BP 12/26/20 1455 (!) 163/74     Pulse Rate 12/26/20 1455 (!) 59     Resp --      Temp 12/26/20 1455 (!) 97.4 F (36.3 C)     Temp Source 12/26/20 1455 Oral     SpO2 12/26/20 1455 99 %     Weight --      Height --      Head Circumference --      Peak Flow --      Pain Score 12/26/20 1525 7     Pain Loc --      Pain Edu? --      Excl. in [place]? --    No data found.  Updated Vital Signs BP (!) 163/74 (BP Location: Right Arm)   Pulse (!) 59   Temp (!) 97.4 F (36.3 C) (Oral)   LMP 04/25/2003   SpO2 99%   Visual Acuity Right Eye Distance:   Left Eye Distance:   Bilateral Distance:    Right Eye Near:   Left Eye Near:    Bilateral Near:     Physical Exam Vitals and nursing note reviewed.  Constitutional:      General: She is  not in acute distress.    Appearance: Normal appearance. She is not ill-appearing, toxic-appearing or diaphoretic.  HENT:     Head: Normocephalic and atraumatic.  Eyes:     Conjunctiva/sclera: Conjunctivae normal.  Cardiovascular:     Rate and Rhythm: Normal rate and regular rhythm.     Pulses: Normal pulses.     Heart sounds: Normal heart sounds.  Pulmonary:     Effort: Pulmonary effort is normal.  Abdominal:     General: Abdomen is flat.  Musculoskeletal:        General: Normal range of motion.     Left forearm: Swelling, tenderness and bony tenderness present. No deformity.     Cervical back: Normal range of motion.  Skin:    General: Skin is warm and dry.  Neurological:     General: No focal deficit present.     Mental Status: She is alert and oriented to person, place, and time.  Psychiatric:        Mood and Affect: Mood normal.     UC Treatments /  Results  Labs (all labs ordered are listed, but only abnormal results are displayed) Labs Reviewed - No data to display  EKG   Radiology DG Forearm Left  Result Date: 12/26/2020 CLINICAL DATA:  [place] EXAM: LEFT FOREARM - 2 VIEW COMPARISON:  None. FINDINGS: Corticated ossicle in the expected location of the ulnar styloid suggesting remote fracture. No acute fracture or dislocation. Carpal rows intact. Mild osteopenia. IMPRESSION: No acute findings. Electronically Signed   By: Lucrezia Europe M.D.   On: 12/26/2020 15:20    Procedures Procedures (including critical care time)  Medications Ordered in UC Medications - No data to display  Initial Impression / Assessment and Plan / UC Course  I have reviewed the triage vital signs and the nursing notes.  Pertinent labs & imaging results that were available during my care of the patient were reviewed by me and considered in my medical decision making (see chart for details).    Assessment negative for red flags or concerns.  Likely contusion of the left arm from a mechanical  fall.  X-ray with no acute bony abnormality.  Tylenol and/or ibuprofen as needed.  May wear a compression sleeve or Ace bandage for comfort.  Recommend rest, ice, compression, and elevation.  Follow-up with orthopedics if symptoms do not improve in the next few weeks. Final Clinical Impressions(s) / UC Diagnoses   Final diagnoses:  Arm contusion, left, initial encounter  Fall, initial encounter     Discharge Instructions      You can take Tylenol and/or Ibuprofen as needed for pain relief and fever reduction.   You can wear an ace bandage or compression bandage as needed for comfort.   Rest as much as possible Ice for 10-15 minutes every 4-6 hours as needed for pain and swelling Compression- use an ace bandage or splint for comfort Elevate above your hip/heart when sitting and laying down  Follow up with sports medicine or orthopedics if symptoms do not improve in the next week.      ED Prescriptions   None    PDMP not reviewed this encounter.   Pearson Forster, NP 12/26/20 [phone]

## 2020-12-29 ENCOUNTER — Ambulatory Visit (INDEPENDENT_AMBULATORY_CARE_PROVIDER_SITE_OTHER): Payer: Medicare PPO | Admitting: Obstetrics & Gynecology

## 2020-12-29 ENCOUNTER — Encounter (HOSPITAL_BASED_OUTPATIENT_CLINIC_OR_DEPARTMENT_OTHER): Payer: Self-pay | Admitting: Obstetrics & Gynecology

## 2020-12-29 ENCOUNTER — Other Ambulatory Visit: Payer: Self-pay

## 2020-12-29 VITALS — BP 158/67 | HR 57 | Ht 62.5 in | Wt 155.0 lb

## 2020-12-29 DIAGNOSIS — N952 Postmenopausal atrophic vaginitis: Secondary | ICD-10-CM | POA: Diagnosis not present

## 2020-12-29 DIAGNOSIS — N9489 Other specified conditions associated with female genital organs and menstrual cycle: Secondary | ICD-10-CM | POA: Diagnosis not present

## 2020-12-29 DIAGNOSIS — C50912 Malignant neoplasm of unspecified site of left female breast: Secondary | ICD-10-CM

## 2020-12-29 DIAGNOSIS — Z01419 Encounter for gynecological examination (general) (routine) without abnormal findings: Secondary | ICD-10-CM

## 2020-12-29 DIAGNOSIS — Z780 Asymptomatic menopausal state: Secondary | ICD-10-CM

## 2020-12-29 DIAGNOSIS — Z170 Estrogen receptor positive status [ER+]: Secondary | ICD-10-CM

## 2020-12-29 NOTE — Progress Notes (Signed)
75 y.o. G2P1001 Divorced White or Caucasian female here for breast and pelvic exam.  I am also following her for history of breast cancer and h/o recurrence.    Denies vaginal bleeding.  Had ultrasound 11/2019 with normal appearing adnexa.  Did have hx of solid adnexal mass that resolved with follow up.  Pt is aware blood pressure is elevated.  Is on three medications for blood pressure and rate control.  Was having some PVCs and PACs.  Has been seeing Dr. Einar Gip.  Last none from 11/10/2020 reviewed.  .  Pt had rectal bleeding at last visit.  GI follow up was done.  Had colonoscopy done.  Polyps showed adenomatous changes.  Follow up 5 years.  With evaluation, CT was done.  Had intermediate renal mass.  Will have every 6 month follow up done for several years.  Followed by Dr. Tresa Moore.  Has MRI scheduled for 01/01/2021.    Pondering moving to [place], to be closer to daughter, son in Sports coach and granddaughter.  She is having some stressors with being alone.  She is helping to care for a friend.  Does feel alone at times and that her mood is depressed.  She has considered seeing a therapist.  Not interested in antidepressant at this time.  We discussed this today and for her to call if ever feels differently about this.  Patient's last menstrual period was 04/25/2003.          Sexually active: Yes.    H/O STD:  no  Health Maintenance: PCP:  Dr. Ardeth Perfect.  Last wellness appt was this summer.  Did blood work at that appt:  yes Vaccines are up to date:  reviewed.  I do think tdap is due.  I do not have shingles vaccines dates Colonoscopy:  05/03/2020 MMG:  01/15/2020 Negative BMD:  01/15/2020 Last pap smear:  10/04/2018 Negative.   H/o abnormal pap smear: yes, remote hx    reports that she has never smoked. She has never used smokeless tobacco. She reports current alcohol use. She reports that she does not use drugs.  Past Medical History:  Diagnosis Date   Basal cell carcinoma 1994   right side  of node   Benign positional vertigo    with rolling over    Dysrhythmia    Generalized anxiety disorder    GERD (gastroesophageal reflux disease)    Graves disease 2007   thyroid nodule   Hard of hearing    Heart murmur    History of breast cancer 1995   left - treated with mastectomy   History of radiation therapy 2001   37 sessions following recurrence of breast cancer in left chest wall   Hypertension    Knee pain, bilateral 12/14/2014   Major depressive disorder in remission    Mild persistent asthma, uncomplicated [id number]   Nocturia    OA (osteoarthritis) of hip 09/08/2015   Ovarian mass    small solid nodule 8x67m: Right ovary - stable since 2000, follow u/s do not show this.   Renal mass 2021   followed by Dr. [facility].  Having every 6 month follow up   Seasonal allergies    Stress fracture of metatarsal bone of left foot 12/15/2019   Vegetarian diet     Past Surgical History:  Procedure Laterality Date   APPENDECTOMY     basal cell removal  7/94   right nose   BREAST BIOPSY Right 1987   benign fibroadenoma  breast cancer recurrence Left 01/2000   at site of left chest wall after previous mastectomy with removal and treatment with radiaton.   CARPAL TUNNEL RELEASE Right 05/08/2013   Procedure: RIGHT CARPAL TUNNEL RELEASE;  Surgeon: Cammie Sickle., MD;  Location: [facility];  Service: Orthopedics;  Laterality: Right;   CATARACT EXTRACTION     03/20/14 left eye, 12/15 right eye   COLPOSCOPY W/ BIOPSY / CURETTAGE  03/12/12   benign, but + HR HPV   EYE SURGERY     bilat cataract surgery    MASTECTOMY Left 3/95   Stage IIb 0/14 LN ER/ PR + with mastectomy - Dr. Mendel Ryder   MOHS SURGERY     right nare   [place] Right 09/08/2015   Procedure: TOTAL HIP ARTHROPLASTY ANTERIOR APPROACH;  Surgeon: Gaynelle Arabian, MD;  Location: [facility];  Service: Orthopedics;   Laterality: Right;    Current Outpatient Medications  Medication Sig Dispense Refill   acetaminophen (TYLENOL) 650 MG CR tablet Take 1,300 mg by mouth as needed.      albuterol (PROVENTIL HFA;VENTOLIN HFA) 108 (90 Base) MCG/ACT inhaler Inhale 2 puffs into the lungs every 4 (four) hours as needed for wheezing. 1 Inhaler 3   amLODipine (NORVASC) 2.5 MG tablet Take 1 tablet by mouth daily.     anastrozole (ARIMIDEX) 1 MG tablet TAKE 1 TABLET BY MOUTH EVERY DAY 90 tablet 3   Artificial Tear Solution (TEARS NATURALE OP) Place 2 drops into both eyes daily as needed (For dry eyes.).     Biotin 5000 MCG SUBL Take 1 tablet by mouth See admin instructions.     budesonide-formoterol (SYMBICORT) 80-4.5 MCG/ACT inhaler Inhale 2 puffs into the lungs 2 (two) times daily.     Cetirizine HCl (ZYRTEC ALLERGY PO) Take by mouth as needed.      EPINEPHrine 0.3 mg/0.3 mL IJ SOAJ injection See admin instructions.     ketotifen (ZADITOR) 0.025 % ophthalmic solution Place 1 drop into both eyes daily.     MAGNESIUM CITRATE PO Take 1 tablet by mouth 2 (two) times daily.     Menaquinone-7 (VITAMIN K2) 100 MCG CAPS Take by mouth.     metoprolol succinate (TOPROL-XL) 50 MG 24 hr tablet Take 1 tablet (50 mg total) by mouth daily. Take with or immediately following a meal. 90 tablet 3   Multiple Vitamin (MULTIVITAMIN) tablet Take 1 tablet by mouth daily.     olmesartan (BENICAR) 40 MG tablet      VITAMIN D PO Take 4,000 Int'l Units by mouth.     aMILoride (MIDAMOR) 5 MG tablet Take 1 tablet (5 mg total) by mouth in the morning. After breakfast (Patient not taking: Reported on 12/29/2020) 90 tablet 3   No current facility-administered medications for this visit.    Family History  Problem Relation Age of Onset   Breast cancer Mother 60       deceased 50   Osteoporosis Mother    Hypertension Mother    Dementia Mother        unspecified type   Mental illness Mother    Hypertension Father    Heart attack Father     Dementia Father        vascular dementia with history of "mini-strokes"   Breast cancer Sister 34       bilateral; recurrent 12 years late  Diabetes Brother    Lung cancer Maternal Grandfather        deceased 38   Cancer Paternal Grandmother        duodenal; deceased 104    Review of Systems  Constitutional: Negative.   Gastrointestinal: Negative.   Genitourinary: Negative.    Exam:   BP (!) 158/67 (BP Location: Right Arm, Patient Position: Sitting, Cuff Size: Large)   Pulse (!) 57   Ht 5' 2.5" (1.588 m)   Wt 155 lb (70.3 kg)   LMP 04/25/2003   BMI 27.90 kg/m   Height: 5' 2.5" (158.8 cm)  General appearance: alert, cooperative and appears stated age Breasts: right breast without masses, skin changes, LAD, nipple discharge; left breast s/p mastectomy and without masses, has well healed scars present Abdomen: soft, non-tender; bowel sounds normal; no masses,  no organomegaly Lymph nodes: Cervical, supraclavicular, and axillary nodes normal.  No abnormal inguinal nodes palpated Neurologic: Grossly normal  Pelvic: External genitalia:  no lesions              Urethra:  normal appearing urethra with no masses, tenderness or lesions              Bartholins and Skenes: normal                 Vagina: normal appearing vagina with atrophic changes and no discharge, no lesions              Cervix: no lesions              Pap taken: No. Bimanual Exam:  Uterus:  normal size, contour, position, consistency, mobility, non-tender              Adnexa: normal adnexa and no mass, fullness, tenderness               Rectovaginal: Confirms               Anus:  normal sphincter tone, no lesions  Chaperone, Octaviano Batty, CMA, was present for exam.  Assessment/Plan: 1. Encntr for gyn exam (general) (routine) w/o abn findings - pap neg 2020 - MMG 12/2019 - colonoscopy 04/2020 - BMD done 2021 - lab work done with Dr. Jackelyn Poling - vaccines reviewed.  I dont' have dates for her shingles vaccines and  last tdap  2. Postmenopausal - no HRT  3. Malignant neoplasm of left breast in female, estrogen receptor positive, unspecified site of breast ([place]) - still on arimidex because has done well since recurrence  4. Vaginal atrophy  5. Adnexal mass - follow up ultrasound last year was normal  6.  Renal mass - being followed by Dr. Tresa Moore  7. Depressed mood - does have names for therapist - declines medication at this time.

## 2021-01-01 ENCOUNTER — Other Ambulatory Visit: Payer: Self-pay

## 2021-01-01 ENCOUNTER — Ambulatory Visit
Admission: RE | Admit: 2021-01-01 | Discharge: 2021-01-01 | Disposition: A | Discharge status: Home / Self Care | Payer: Medicare PPO | Source: Ambulatory Visit | Attending: Anesthesiology | Admitting: Anesthesiology

## 2021-01-01 DIAGNOSIS — K8020 Calculus of gallbladder without cholecystitis without obstruction: Secondary | ICD-10-CM | POA: Diagnosis not present

## 2021-01-01 DIAGNOSIS — N281 Cyst of kidney, acquired: Secondary | ICD-10-CM | POA: Diagnosis not present

## 2021-01-01 DIAGNOSIS — D4102 Neoplasm of uncertain behavior of left kidney: Secondary | ICD-10-CM

## 2021-01-01 IMAGING — MR MR ABDOMEN WO/W CM
11 of 17 series · 23 of 48 positions shown · IV contrast (multihance)
Comparison: CT abdomen pelvis, [DATE]

CLINICAL DATA: Characterize left renal cyst

EXAM:
MRI ABDOMEN WITHOUT AND WITH CONTRAST
TECHNIQUE: Multiplanar multisequence MR imaging of the abdomen was performed
both before and after the administration of intravenous contrast.
CONTRAST:  15mL MULTIHANCE GADOBENATE DIMEGLUMINE 529 MG/ML IV SOLN

[Series 2: T2 · coronal · 5.0mm · 1.56mm/px · 1 of 20 slices shown (1 of 3)]
[im 1/20]
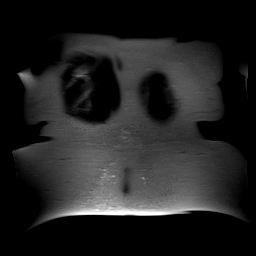

[Series 3: T2 · axial · 6.5mm · 0.74mm/px · 1 of 24 slices shown (2 of 3)]
[im 1/24]
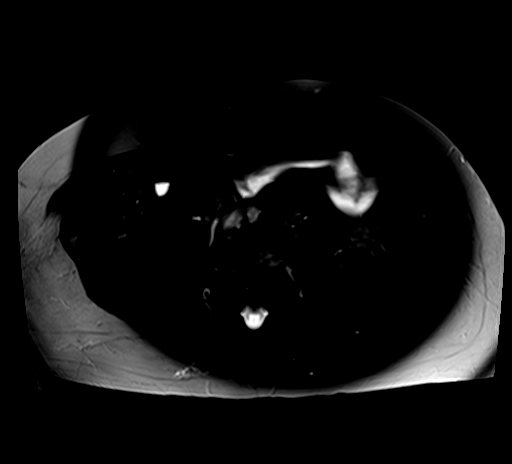

[Series 4: [id number]_[id number] · axial · 6.0mm · 1.98mm/px · z∈[-93,+61]mm · 2 of 45 slices shown]
[im 1/45]
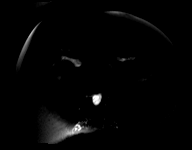
[im 45/45]
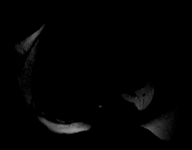

[Series 5: [id number]_adc · axial · 6.0mm · 1.98mm/px · 1 of 15 slices shown]
[im 1/15]
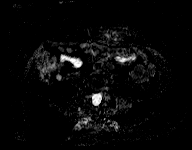

[Series 6: T2 · axial · 5.0mm · 1.37mm/px · 1 of 30 slices shown (3 of 3)]
[im 1/30]
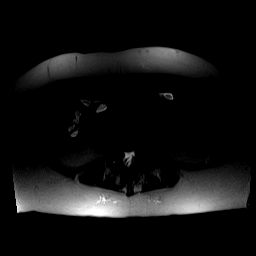

[Series 7: axial tru fisp · axial · 4.0mm · 1.48mm/px · 1 of 30 slices shown]
[im 1/30]
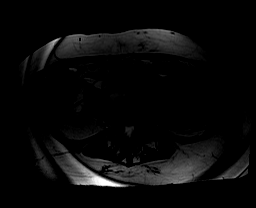

[Series 8: axial in out · axial · 5.5mm · 0.74mm/px · z∈[-89,+57]mm · 2 of 48 slices shown]
[im 1/48]
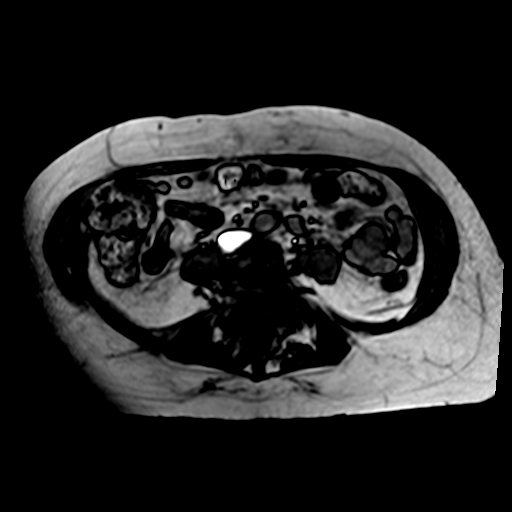
[im 48/48]
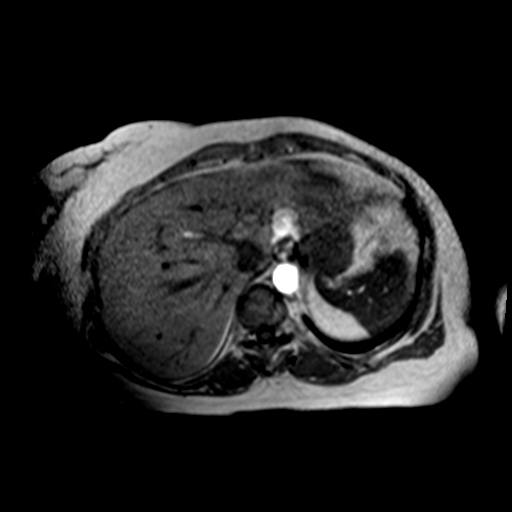

[Series 9: T1 dynamic · axial · non-contrast · 2.5mm · 0.78mm/px · z∈[-90,+57]mm · 4 of 60 slices shown]
[im 1/60]
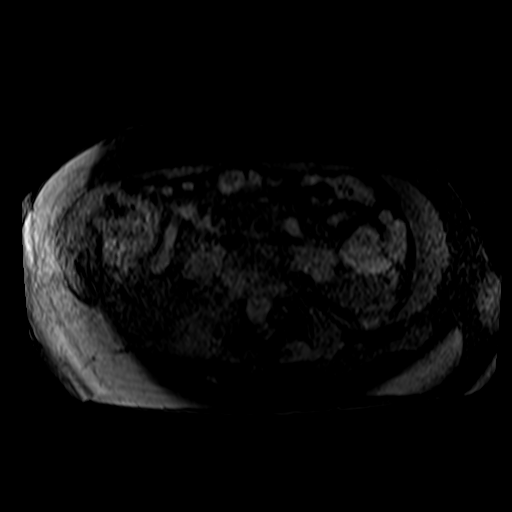
[im 20/60]
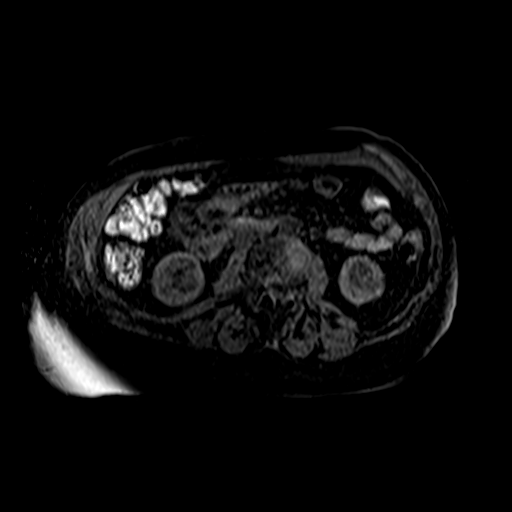
[im 40/60]
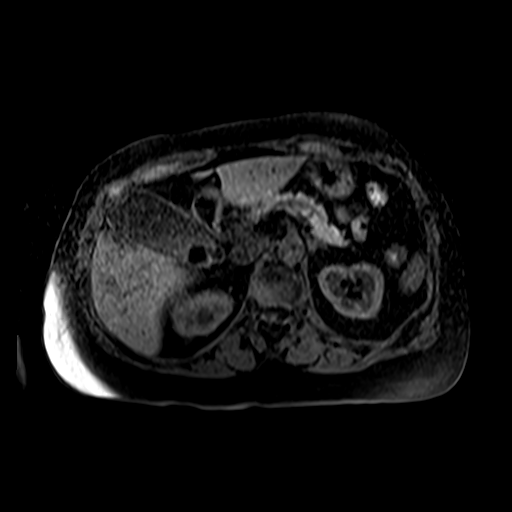
[im 60/60]
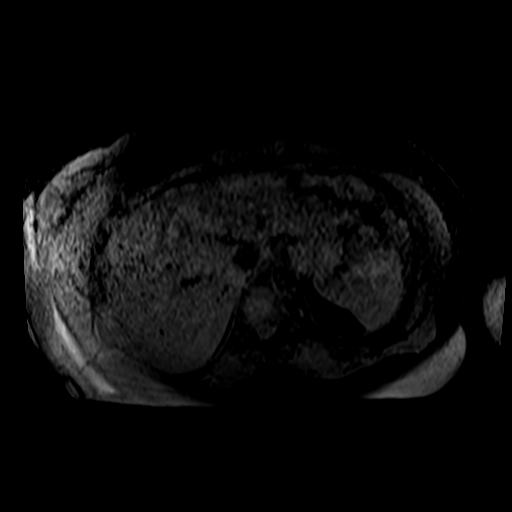

[Series 10: post 25 sec · axial · 2.5mm · 0.78mm/px · z∈[-90,+57]mm · 4 of 60 slices shown]
[im 1/60]
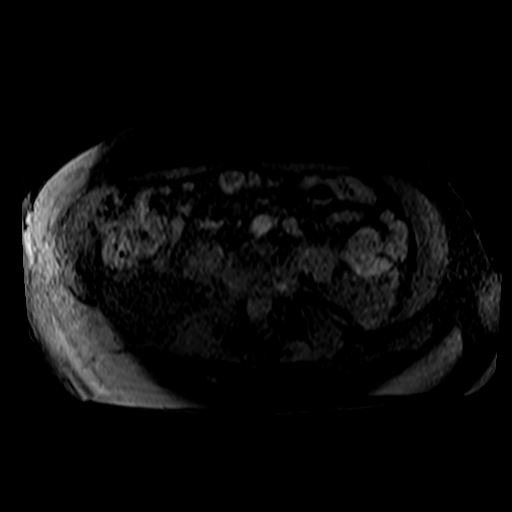
[im 20/60]
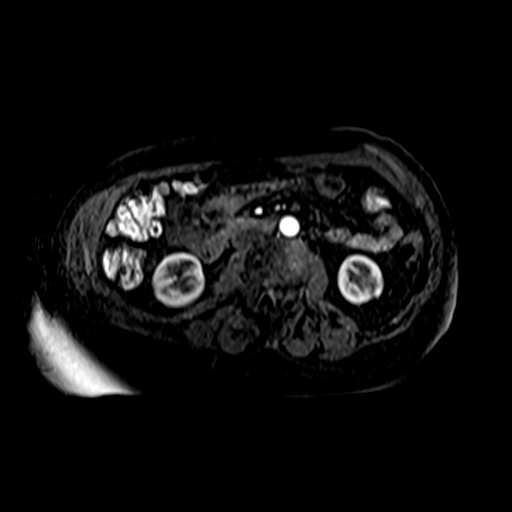
[im 40/60]
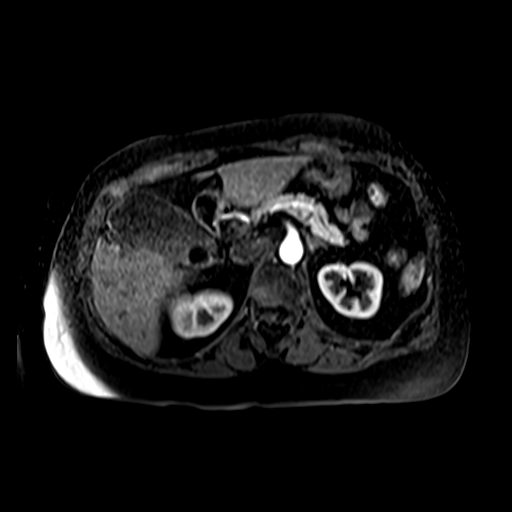
[im 60/60]
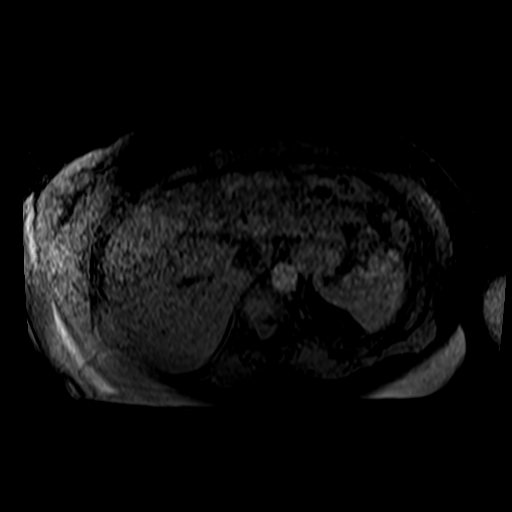

[Series 11: post 25 sec_sub · axial · 2.5mm · 0.78mm/px · z∈[-90,+57]mm · 4 of 60 slices shown]
[im 1/60]
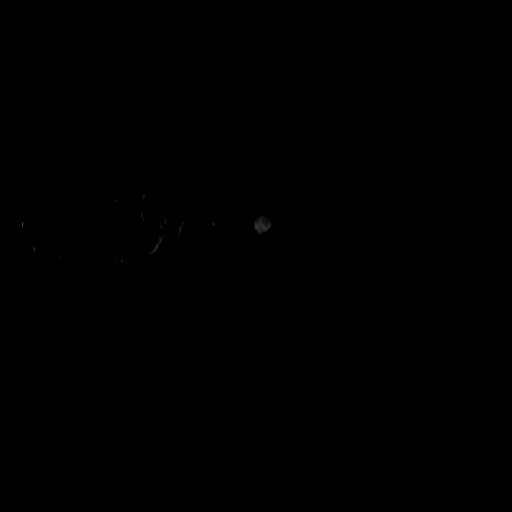
[im 20/60]
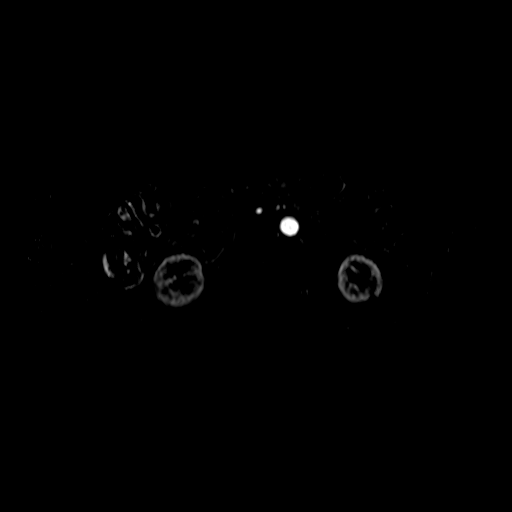
[im 40/60]
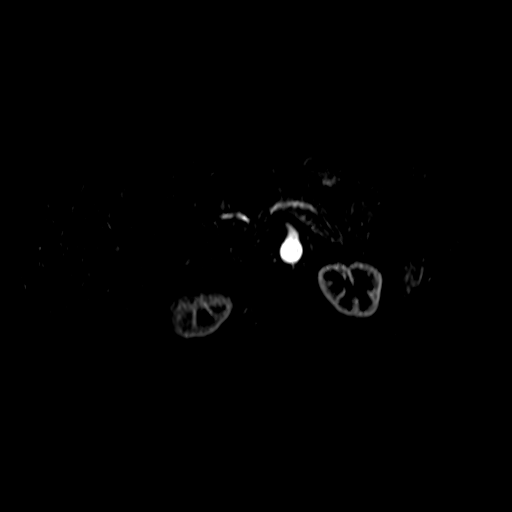
[im 60/60]
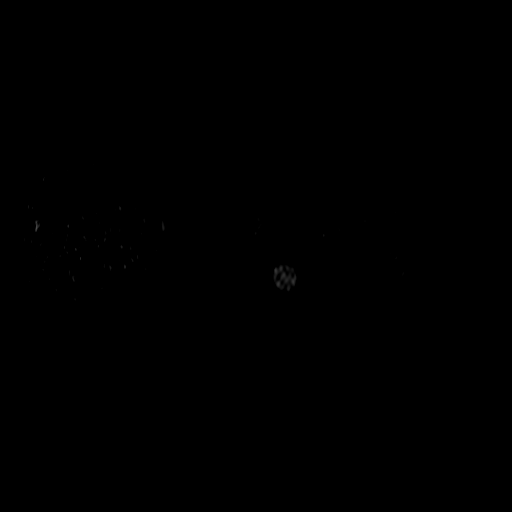

[Series 12: post 45 sec · axial · 2.5mm · 0.78mm/px · z∈[-90,-43]mm · 2 of 60 slices shown]
[im 1/60]
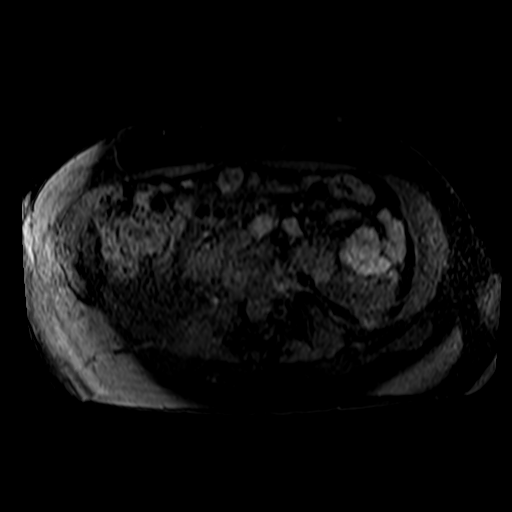
[im 20/60]
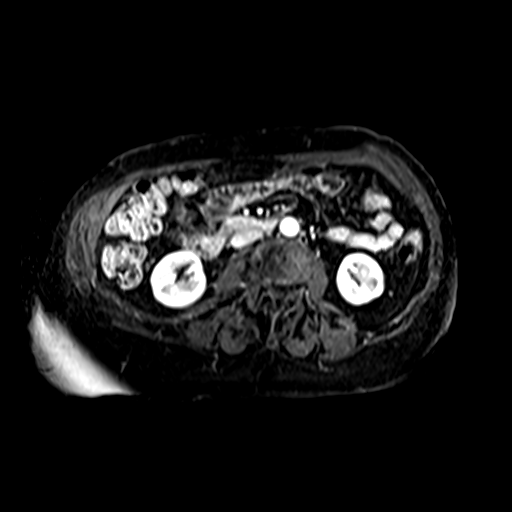

[23 of 48 positions shown; findings below may reference images not displayed]

FINDINGS: Lower chest: No acute findings.

Hepatobiliary: No mass or other parenchymal abnormality identified.
Large gallstone in the dependent gallbladder. No biliary ductal
dilatation.

Pancreas: No mass, inflammatory changes, or other parenchymal
abnormality identified. No pancreatic ductal dilatation.

Spleen:  Within normal limits in size and appearance.

Adrenals/Urinary Tract: No masses identified. There is a fluid
signal, thinly septated nonenhancing cyst of the lateral midportion
of the left kidney (series 7, image 18). There is an intrinsically
T1 hyperintense, nonenhancing hemorrhagic or proteinaceous cyst of
the anterior midportion of the left kidney (series 9, image 27). No
evidence of hydronephrosis.

Stomach/Bowel: Visualized portions within the abdomen are
unremarkable.

Vascular/Lymphatic: No pathologically enlarged lymph nodes
identified. No abdominal aortic aneurysm demonstrated.

Other:  None.

Musculoskeletal: No suspicious bone lesions identified.
IMPRESSION: 1. There is an intrinsically T1 hyperintense, nonenhancing
hemorrhagic or proteinaceous cyst of the anterior midportion of the
left kidney, corresponding to abnormality identified by prior CT.
There is additionally a benign fluid signal, thinly septated
nonenhancing cyst of the lateral midportion of the left kidney. No
further follow-up or characterization is required for these
definitively benign lesions.
2. Large gallstone in the dependent gallbladder.

## 2021-01-01 MED ORDER — GADOBENATE DIMEGLUMINE 529 MG/ML IV SOLN
15.0000 mL | Freq: Once | INTRAVENOUS | Status: AC | PRN
  Administered 2021-01-01: 15 mL via INTRAVENOUS

## 2021-01-05 ENCOUNTER — Ambulatory Visit (HOSPITAL_BASED_OUTPATIENT_CLINIC_OR_DEPARTMENT_OTHER): Payer: Medicare PPO | Admitting: Obstetrics & Gynecology

## 2021-01-18 DIAGNOSIS — D4102 Neoplasm of uncertain behavior of left kidney: Secondary | ICD-10-CM | POA: Diagnosis not present

## 2021-01-24 DIAGNOSIS — N281 Cyst of kidney, acquired: Secondary | ICD-10-CM | POA: Diagnosis not present

## 2021-01-24 DIAGNOSIS — D4102 Neoplasm of uncertain behavior of left kidney: Secondary | ICD-10-CM | POA: Diagnosis not present

## 2021-01-31 DIAGNOSIS — Z1231 Encounter for screening mammogram for malignant neoplasm of breast: Secondary | ICD-10-CM | POA: Diagnosis not present

## 2021-02-01 DIAGNOSIS — R7309 Other abnormal glucose: Secondary | ICD-10-CM | POA: Diagnosis not present

## 2021-02-01 DIAGNOSIS — D3131 Benign neoplasm of right choroid: Secondary | ICD-10-CM | POA: Diagnosis not present

## 2021-02-01 DIAGNOSIS — Z961 Presence of intraocular lens: Secondary | ICD-10-CM | POA: Diagnosis not present

## 2021-02-01 DIAGNOSIS — H04123 Dry eye syndrome of bilateral lacrimal glands: Secondary | ICD-10-CM | POA: Diagnosis not present

## 2021-02-21 ENCOUNTER — Ambulatory Visit: Payer: Medicare PPO | Admitting: Student

## 2021-02-21 ENCOUNTER — Other Ambulatory Visit: Payer: Self-pay

## 2021-02-21 ENCOUNTER — Encounter: Payer: Self-pay | Admitting: Student

## 2021-02-21 VITALS — BP 147/74 | HR 64 | Temp 96.5°F | Resp 17 | Ht 62.5 in | Wt 155.8 lb

## 2021-02-21 DIAGNOSIS — I10 Essential (primary) hypertension: Secondary | ICD-10-CM | POA: Diagnosis not present

## 2021-02-21 DIAGNOSIS — R002 Palpitations: Secondary | ICD-10-CM | POA: Diagnosis not present

## 2021-02-21 NOTE — Progress Notes (Signed)
Primary Physician/Referring:  Velna Hatchet, MD  Patient ID: JULIZA MACHNIK, female    DOB: 1946/02/18, 75 y.o.   MRN: [id number]  Chief Complaint  Patient presents with   Follow-up   Hypertension   Palpitations   HPI:    Sarah Underwood  is a 75 y.o. Caucasian female with a past medical history significant for HTN, mild mitral regurgitation, mild to moderate tricuspid regurgitation, mild pulmonary hypertension, Graves disease, asthma, and breast cancer. She was seen by me on 11/10/2020 for palpitations that started about 6 weeks ago.  EKG had shown frequent PACs in bigeminal pattern, uncontrolled hypertension and LVH by EKG. patient was subsequently started on metoprolol, which has resolved her palpitations.  Patient presents for 40-monthfollow-up of hypertension and palpitations.  At last office visit added amiloride 5 mg daily, unfortunately repeat BMP has not been done.  Patient has not been taking amiloride as she is a vegetarian and her diet is high in potassium, therefore she was worried about hyperkalemia.  Patient has been monitoring her blood pressure regularly at home and reports average 135/70s mmHg.  She reports a maximum systolic blood pressure of 139 mmHg.  Patient also notes that she is in significant pain due to orthopedic issues with her left knee in particular.  She continues to tolerate metoprolol succinate without issue.  Denies chest pain, dyspnea, palpitations, syncope, near syncope.  Past Medical History:  Diagnosis Date   Basal cell carcinoma 1994   right side of node   Benign positional vertigo    with rolling over    Dysrhythmia    Generalized anxiety disorder    GERD (gastroesophageal reflux disease)    Graves disease 2007   thyroid nodule   Hard of hearing    Heart murmur    History of breast cancer 1995   left - treated with mastectomy   History of radiation therapy 2001   37 sessions following recurrence of breast cancer in left chest wall    Hypertension    Knee pain, bilateral 12/14/2014   Major depressive disorder in remission    Mild persistent asthma, uncomplicated 075/30/0511  Nocturia    OA (osteoarthritis) of hip 09/08/2015   Ovarian mass    small solid nodule 8x760m Right ovary - stable since 2000, follow u/s do not show this.   Renal mass 2021   [facility]. [facility].  Having every 6 month follow up   Seasonal allergies    Stress fracture of metatarsal bone of left foot 12/15/2019   Vegetarian diet    Past Surgical History:  Procedure Laterality Date   APPENDECTOMY     basal cell removal  7/94   right nose   BREAST BIOPSY Right 1987   benign fibroadenoma   breast cancer recurrence Left 01/2000   at site of left chest wall after previous mastectomy with removal and treatment with radiaton.   CARPAL TUNNEL RELEASE Right 05/08/2013   Procedure: RIGHT CARPAL TUNNEL RELEASE;  Surgeon: RoCammie Sickle MD;  Location: [facility]: Orthopedics;  Laterality: Right;   CATARACT EXTRACTION     03/20/14 left eye, 12/15 right eye   COLPOSCOPY W/ BIOPSY / CURETTAGE  03/12/12   benign, but + HR HPV   EYE SURGERY     bilat cataract surgery    MASTECTOMY Left 3/95   Stage IIb 0/14 LN ER/ PR + with mastectomy - Dr. LiLiliana ClineURGERY  right nare   OVARY SURGERY  1974   lt   TONSILLECTOMY AND ADENOIDECTOMY  1950   TOTAL HIP ARTHROPLASTY Right 09/08/2015   Procedure: TOTAL HIP ARTHROPLASTY ANTERIOR APPROACH;  Surgeon: Gaynelle Arabian, MD;  Location: [facility];  Service: Orthopedics;  Laterality: Right;   Family History  Problem Relation Age of Onset   Breast cancer Mother 61       deceased 72   Osteoporosis Mother    Hypertension Mother    Dementia Mother        unspecified type   Mental illness Mother    Hypertension Father    Heart attack Father    Dementia Father        vascular dementia with history of "mini-strokes"   Breast cancer Sister 54       bilateral; recurrent  23 years late   Diabetes Brother    Lung cancer Maternal Grandfather        deceased 14   Cancer Paternal Grandmother        duodenal; deceased 38    Social History   Tobacco Use   Smoking status: Never   Smokeless tobacco: Never  Substance Use Topics   Alcohol use: Yes    Comment: rare   Marital Status: Divorced  ROS  Review of Systems  Constitutional: Negative for malaise/fatigue and weight gain.  Cardiovascular:  Negative for chest pain, claudication, dyspnea on exertion, leg swelling (mild ankle, improvd), near-syncope, orthopnea, palpitations (improved), paroxysmal nocturnal dyspnea and syncope.  Respiratory:  Negative for shortness of breath.   Neurological:  Negative for dizziness.  Objective  Blood pressure (!) 147/74, pulse 64, temperature (!) 96.5 F (35.8 C), temperature source Temporal, resp. rate 17, height 5' 2.5" (1.588 m), weight 155 lb 12.8 oz (70.7 kg), last menstrual period 04/25/2003, SpO2 98 %. Body mass index is 28.04 kg/m.  Vitals with BMI 02/21/2021 02/21/2021 02/21/2021  Height - - 5' 2.5"  Weight - - 155 lbs 13 oz  BMI - - 38.75  Systolic 643 329 518  Diastolic 74 74 71  Pulse 64 64 70     Physical Exam Neck:     Vascular: No carotid bruit or JVD.  Cardiovascular:     Rate and Rhythm: Normal rate and regular rhythm. No extrasystoles are present.    Pulses: Intact distal pulses.     Heart sounds: Normal heart sounds. No murmur heard.   No gallop.  Pulmonary:     Effort: Pulmonary effort is normal.     Breath sounds: Normal breath sounds.  Abdominal:     General: Bowel sounds are normal.     Palpations: Abdomen is soft.     Tenderness: There is no abdominal tenderness.  Musculoskeletal:     Right lower leg: No edema.     Left lower leg: No edema.  Neurological:     Mental Status: She is alert.     Laboratory examination:   Recent Labs    04/09/20 1956  NA 138  K 4.4  CL 102  CO2 28  GLUCOSE 97  BUN 14  CREATININE 0.79   CALCIUM 9.3  GFRNONAA >60   CrCl cannot be calculated (Patient's most recent lab result is older than the maximum 21 days allowed.).  CMP Latest Ref Rng & Units 04/09/2020 09/10/2015 09/09/2015  Glucose 70 - 99 mg/dL 97 125(H) 178(H)  BUN 8 - 23 mg/dL _0 Creatinine 0.44 - 1.00 mg/dL 0.79 0.68 0.73  Sodium  135 - 145 mmol/L 138 137 135  Potassium 3.5 - 5.1 mmol/L 4.4 3.9 4.5  Chloride 98 - 111 mmol/L 102 102 106  CO2 22 - 32 mmol/L _0 Calcium 8.9 - 10.3 mg/dL 9.3 8.8(L) 8.5(L)  Total Protein 6.5 - 8.1 g/dL 7.3 - -  Total Bilirubin 0.3 - 1.2 mg/dL 0.7 - -  Alkaline Phos 38 - 126 U/L 72 - -  AST 15 - 41 U/L 19 - -  ALT 0 - 44 U/L 17 - -   CBC Latest Ref Rng & Units 04/09/2020 09/10/2015 09/09/2015  WBC 4.0 - 10.5 K/uL 7.3 17.1(H) 12.8(H)  Hemoglobin 12.0 - 15.0 g/dL 13.8 11.3(L) 11.4(L)  Hematocrit 36.0 - 46.0 % 42.6 33.0(L) 33.3(L)  Platelets 150 - 400 K/uL 316 305 282    Lipid Panel No results for input(s): CHOL, TRIG, LDLCALC, VLDL, HDL, CHOLHDL, LDLDIRECT in the last 8760 hours. Lipid Panel  No results found for: CHOL, TRIG, HDL, CHOLHDL, VLDL, LDLCALC, LDLDIRECT, LABVLDL   HEMOGLOBIN A1C No results found for: HGBA1C, MPG TSH No results for input(s): TSH in the last 8760 hours.  External labs:   Labs 11/03/2019:  TSH normal.  Vitamin D normal at 55.1.  A1c 5.6%.  Serum glucose 90 mg, BUN 15, creatinine 0.8, EGFR 70 mL, potassium 4.2.  CBC normal with a hemoglobin of 13.5 and platelet count of 274.  Total cholesterol 162, triglycerides 41, HDL 67, LDL 87.  Non-HDL cholesterol 95.  Medications and allergies   Allergies  Allergen Reactions   Other Swelling    Trees   Molds & Smuts Other (See Comments)   Erythromycin Nausea And Vomiting   Penicillins Hives and Other (See Comments)    Has patient had a PCN reaction causing immediate rash, facial/tongue/throat swelling, SOB or lightheadedness with hypotension: no Has patient had a PCN reaction causing  severe rash involving mucus membranes or skin necrosis: no Has patient had a PCN reaction that required hospitalization no Has patient had a PCN reaction occurring within the last 10 years: no If all of the above answers are "NO", then may proceed with Cephalosporin use.    Terbinafine And Related     rash   Hydrochlorothiazide Rash    Pt states caused a sunburn type reaction    Sulfa Antibiotics Rash     Medication prior to this encounter:   Outpatient Medications Prior to Visit  Medication Sig Dispense Refill   acetaminophen (TYLENOL) 650 MG CR tablet Take 1,300 mg by mouth as needed.      albuterol (PROVENTIL HFA;VENTOLIN HFA) 108 (90 Base) MCG/ACT inhaler Inhale 2 puffs into the lungs every 4 (four) hours as needed for wheezing. 1 Inhaler 3   amLODipine (NORVASC) 2.5 MG tablet Take 1 tablet by mouth daily.     anastrozole (ARIMIDEX) 1 MG tablet TAKE 1 TABLET BY MOUTH EVERY DAY 90 tablet 3   Artificial Tear Solution (TEARS NATURALE OP) Place 2 drops into both eyes daily as needed (For dry eyes.).     Biotin 5000 MCG SUBL Take 1 tablet by mouth See admin instructions.     budesonide-formoterol (SYMBICORT) 80-4.5 MCG/ACT inhaler Inhale 2 puffs into the lungs 2 (two) times daily.     Cetirizine HCl (ZYRTEC ALLERGY PO) Take by mouth as needed.      EPINEPHrine 0.3 mg/0.3 mL IJ SOAJ injection See admin instructions.     ketotifen (ZADITOR) 0.025 % ophthalmic solution Place 1 drop into both eyes daily.  MAGNESIUM CITRATE PO Take 1 tablet by mouth 2 (two) times daily.     Menaquinone-7 (VITAMIN K2) 100 MCG CAPS Take by mouth.     metoprolol succinate (TOPROL-XL) 50 MG 24 hr tablet Take 1 tablet (50 mg total) by mouth daily. Take with or immediately following a meal. 90 tablet 3   Multiple Vitamin (MULTIVITAMIN) tablet Take 1 tablet by mouth daily.     olmesartan (BENICAR) 40 MG tablet      VITAMIN D PO Take 4,000 Int'l Units by mouth.     aMILoride (MIDAMOR) 5 MG tablet Take 1 tablet  (5 mg total) by mouth in the morning. After breakfast (Patient not taking: No sig reported) 90 tablet 3   No facility-administered medications prior to visit.     Medication list after today's encounter   Current Outpatient Medications  Medication Instructions   acetaminophen (TYLENOL) 1,300 mg, Oral, As needed   albuterol (PROVENTIL HFA;VENTOLIN HFA) 108 (90 Base) MCG/ACT inhaler 2 puffs, Inhalation, Every 4 hours PRN   amLODipine (NORVASC) 2.5 MG tablet 1 tablet, Oral, Daily   anastrozole (ARIMIDEX) 1 MG tablet TAKE 1 TABLET BY MOUTH EVERY DAY   Artificial Tear Solution (TEARS NATURALE OP) 2 drops, Both Eyes, Daily PRN   Biotin 5000 MCG SUBL 1 tablet, Oral, See admin instructions   budesonide-formoterol (SYMBICORT) 80-4.5 MCG/ACT inhaler 2 puffs, Inhalation, 2 times daily   Cetirizine HCl (ZYRTEC ALLERGY PO) Oral, As needed   EPINEPHrine 0.3 mg/0.3 mL IJ SOAJ injection See admin instructions   ketotifen (ZADITOR) 0.025 % ophthalmic solution 1 drop, Both Eyes, Daily   MAGNESIUM CITRATE PO 1 tablet, Oral, 2 times daily   Menaquinone-7 (VITAMIN K2) 100 MCG CAPS Oral   metoprolol succinate (TOPROL-XL) 50 mg, Oral, Daily, Take with or immediately following a meal.   Multiple Vitamin (MULTIVITAMIN) tablet 1 tablet, Oral, Daily   olmesartan (BENICAR) 40 MG tablet No dose, route, or frequency recorded.   VITAMIN D PO 4,000 Int'l Units, Oral    Radiology:   No results found.  Cardiac Studies:   Stress EKG 08/20/2013: Indications: Palpitations.  Conclusions: Negative for ischemia. Hypertensive.  The baseline ECG showed NSR,LVH. PAC frequent. During exercise there was no ST-T changes of ischemia. Symptoms: Dyspnea. THR met. Arrhythmia: PAC decreased with exercise.  Continue primary prevention. Needs therapy for hypertensive BP response The patient exercised according to the Bruce   protocol, Total time recorded    5    Min.    32    sec.  achieving a max heart rate of 144  which was  94% of  MPHR for age and  7.3 METS of work. Baseline NIBP was 148/74. Peak NIBP was 220/82 MaxSysp was: 240 MaxDiasp was: 82.  Lexiscan myoview stress test 12/28/2017:  1. Lexiscan stress test was performed in addition to modified Bruce protocol. The patient performed modified Bruce protocol, reaching 71% of the maximum predicted heart rate. Normal hemodynamic response with limited exercise. Stress symptoms included dizziness. The resting electrocardiogram demonstrated normal sinus rhythm, normal resting conduction, no resting arrhythmias and nonspecific inferior ST-T changes.  Stress EKG is non diagnostic for ischemia as it is a pharmacologic stress. Stress electrocardiogram with modified Bruce protocol showed sinus tachycardia 106 bpm, normal stress conduction, no stress arrhythmias and nonspecific inferior ST-T changes.   2. The overall quality of the study is good. There is no evidence of abnormal lung activity. Stress and rest SPECT images demonstrate homogeneous tracer distribution throughout the myocardium. Gated SPECT  imaging reveals normal myocardial thickening and wall motion. The left ventricular ejection fraction was normal (64%).   3. Low risk study.  PCV ECHOCARDIOGRAM COMPLETE 23/41/4436 Normal LV systolic function with visual EF 60-65%. Left ventricle cavity is normal in size. Normal global wall motion. Indeterminate diastolic filling pattern, normal LAP. Left atrial cavity is mildly dilated. Mild (Grade I) mitral regurgitation. Mild tricuspid regurgitation. Mild pulmonary hypertension. RVSP measures 40 mmHg. IVC is dilated with a respiratory response of <50%. Compared to study 01/16/2018: no significant change.    EKG:   EKG 11/10/2020: Normal sinus rhythm at the rate of 75 bpm, left atrial enlargement, normal axis, frequent PACs in bigeminal pattern.  Voltage criteria for LVH.    EKG 08/08/2013: Normal sinus rhythm, PACs.  IRBBB. Otherwise normal EKG.  Assessment     ICD-10-CM   1.  Palpitations  R00.2     2. Primary hypertension  I10       Medications Discontinued During This Encounter  Medication Reason   aMILoride (MIDAMOR) 5 MG tablet Patient has not taken in last 30 days    No orders of the defined types were placed in this encounter.   No orders of the defined types were placed in this encounter.   Recommendations:   JUNE RODE is a 75 y.o. She was seen by me on 11/10/2020 for palpitation.  EKG had shown frequent PACs in bigeminal pattern, uncontrolled hypertension and LVH by EKG. patient was subsequently started on metoprolol, which has resolved her palpitations.  Patient presents for 31-monthfollow-up of hypertension and palpitations.  At last office visit added amiloride 5 mg daily, unfortunately repeat BMP has not been done.  However patient is not taking amiloride due to concern about hyperkalemia given her vegetarian diet high in potassium.  Patient reports home pressure readings which are well controlled.  She states she has compared her home cuff to a nurse friend's manual readings and her home cuff is accurate.  Although blood pressure is elevated in the office today, home readings are under excellent control.  Advised patient to stop amiloride and instead continue to monitor blood pressure at home.  Discussed at length with patient regarding diet and lifestyle modifications, particularly DASH diet, in order to improve blood pressure control further.  Will defer further management to PCP.  Given that patient's blood pressure is well controlled and she is stable from a cardiovascular standpoint shared decision was to follow-up as needed.  Unseld patient regarding signs and symptoms that would warrant urgent or emergent evaluation, or reevaluation in our office, she verbalized understanding agreement.  Follow up as needed.    CAlethia Berthold PA-C 02/21/2021, 10:33 AM Office: [phone]

## 2021-06-16 DIAGNOSIS — Z85828 Personal history of other malignant neoplasm of skin: Secondary | ICD-10-CM | POA: Diagnosis not present

## 2021-06-16 DIAGNOSIS — D225 Melanocytic nevi of trunk: Secondary | ICD-10-CM | POA: Diagnosis not present

## 2021-06-16 DIAGNOSIS — D2261 Melanocytic nevi of right upper limb, including shoulder: Secondary | ICD-10-CM | POA: Diagnosis not present

## 2021-06-16 DIAGNOSIS — L918 Other hypertrophic disorders of the skin: Secondary | ICD-10-CM | POA: Diagnosis not present

## 2021-06-16 DIAGNOSIS — D2271 Melanocytic nevi of right lower limb, including hip: Secondary | ICD-10-CM | POA: Diagnosis not present

## 2021-06-16 DIAGNOSIS — I788 Other diseases of capillaries: Secondary | ICD-10-CM | POA: Diagnosis not present

## 2021-06-16 DIAGNOSIS — D2272 Melanocytic nevi of left lower limb, including hip: Secondary | ICD-10-CM | POA: Diagnosis not present

## 2021-06-16 DIAGNOSIS — L821 Other seborrheic keratosis: Secondary | ICD-10-CM | POA: Diagnosis not present

## 2021-06-16 DIAGNOSIS — L814 Other melanin hyperpigmentation: Secondary | ICD-10-CM | POA: Diagnosis not present

## 2021-07-14 DIAGNOSIS — E1165 Type 2 diabetes mellitus with hyperglycemia: Secondary | ICD-10-CM | POA: Diagnosis not present

## 2021-07-14 DIAGNOSIS — R632 Polyphagia: Secondary | ICD-10-CM | POA: Diagnosis not present

## 2021-07-26 DIAGNOSIS — M25562 Pain in left knee: Secondary | ICD-10-CM | POA: Diagnosis not present

## 2021-07-26 DIAGNOSIS — S76011A Strain of muscle, fascia and tendon of right hip, initial encounter: Secondary | ICD-10-CM | POA: Diagnosis not present

## 2021-07-26 DIAGNOSIS — M7061 Trochanteric bursitis, right hip: Secondary | ICD-10-CM | POA: Diagnosis not present

## 2021-07-26 DIAGNOSIS — M1712 Unilateral primary osteoarthritis, left knee: Secondary | ICD-10-CM | POA: Diagnosis not present

## 2021-07-26 DIAGNOSIS — M25551 Pain in right hip: Secondary | ICD-10-CM | POA: Diagnosis not present

## 2021-08-12 DIAGNOSIS — M25551 Pain in right hip: Secondary | ICD-10-CM | POA: Diagnosis not present

## 2021-08-12 DIAGNOSIS — M1712 Unilateral primary osteoarthritis, left knee: Secondary | ICD-10-CM | POA: Diagnosis not present

## 2021-08-17 DIAGNOSIS — M25551 Pain in right hip: Secondary | ICD-10-CM | POA: Diagnosis not present

## 2021-08-17 DIAGNOSIS — M1712 Unilateral primary osteoarthritis, left knee: Secondary | ICD-10-CM | POA: Diagnosis not present

## 2021-08-19 ENCOUNTER — Inpatient Hospital Stay: Payer: Medicare PPO | Attending: Oncology | Admitting: Oncology

## 2021-08-19 VITALS — BP 145/63 | HR 71 | Temp 97.8°F | Resp 18 | Ht 62.5 in | Wt 158.6 lb

## 2021-08-19 DIAGNOSIS — Z170 Estrogen receptor positive status [ER+]: Secondary | ICD-10-CM

## 2021-08-19 DIAGNOSIS — Z79811 Long term (current) use of aromatase inhibitors: Secondary | ICD-10-CM | POA: Diagnosis not present

## 2021-08-19 DIAGNOSIS — Z9012 Acquired absence of left breast and nipple: Secondary | ICD-10-CM | POA: Diagnosis not present

## 2021-08-19 DIAGNOSIS — C50511 Malignant neoplasm of lower-outer quadrant of right female breast: Secondary | ICD-10-CM | POA: Diagnosis not present

## 2021-08-19 DIAGNOSIS — Z853 Personal history of malignant neoplasm of breast: Secondary | ICD-10-CM | POA: Insufficient documentation

## 2021-08-19 DIAGNOSIS — C50812 Malignant neoplasm of overlapping sites of left female breast: Secondary | ICD-10-CM | POA: Diagnosis not present

## 2021-08-19 NOTE — Progress Notes (Signed)
?  Sarah Underwood ?OFFICE PROGRESS NOTE ? ? ?Diagnosis: Breast cancer ? ?INTERVAL HISTORY:  ? ?Sarah Underwood returns as scheduled.  No change over the chest wall.  She has pruritus of the right nipple.  No discharge, erythema, or mass.  She has mild hot flashes and arthralgias. ? ?Objective: ? ?Vital signs in last 24 hours: ? ?Blood pressure (!) 145/63, pulse 71, temperature 97.8 ?F (36.6 ?C), temperature source Oral, resp. rate 18, height 5' 2.5" (1.588 m), weight 158 lb 9.6 oz (71.9 kg), last menstrual period 04/25/2003, SpO2 98 %. ?  ? ?HEENT: Neck without mass ?Lymphatics: No cervical, supraclavicular, axillary, or inguinal nodes ?Resp: Lungs clear bilaterally ?Cardio: Regular rate and rhythm ?GI: No hepatosplenomegaly ?Vascular: No leg edema ?Breast: Status post left mastectomy.  Telangiectasias at the left anterior chest wall, firm 2 cm nodular area superior to the lateral aspect of the mastectomy scar (patient reports this has been present chronically) right breast without mass.  Examination of the right nipple and areola is unremarkable ? ?Lab Results: ? ?Lab Results  ?Component Value Date  ? WBC 7.3 04/09/2020  ? HGB 13.8 04/09/2020  ? HCT 42.6 04/09/2020  ? MCV 87.5 04/09/2020  ? PLT 316 04/09/2020  ? ? ?CMP  ?Lab Results  ?Component Value Date  ? NA 138 04/09/2020  ? K 4.4 04/09/2020  ? CL 102 04/09/2020  ? CO2 28 04/09/2020  ? GLUCOSE 97 04/09/2020  ? BUN 14 04/09/2020  ? CREATININE 0.79 04/09/2020  ? CALCIUM 9.3 04/09/2020  ? PROT 7.3 04/09/2020  ? ALBUMIN 4.0 04/09/2020  ? AST 19 04/09/2020  ? ALT 17 04/09/2020  ? ALKPHOS 72 04/09/2020  ? BILITOT 0.7 04/09/2020  ? GFRNONAA >60 04/09/2020  ? GFRAA >60 09/10/2015  ? ? ? ?Medications: I have reviewed the patient's current medications. ? ? ?Assessment/Plan: ? ?Sarah Underwood was diagnosed with left-sided breast cancer 1995.  She developed a chest wall recurrence in October 2001.  She will continue indefinite Arimidex. ?Sarah Underwood is in clinical  remission.   ?She will return for an office visit in 1 year.  She is scheduled for a right mammogram in October of 2023.  We will schedule a bone density scan the same day.  ? ?She will call there is a palpable or visible change at the right nipple. ? ? ? ?Betsy Coder, MD ? ?08/19/2021  ?12:27 PM ? ? ?

## 2021-08-22 DIAGNOSIS — M25551 Pain in right hip: Secondary | ICD-10-CM | POA: Diagnosis not present

## 2021-08-22 DIAGNOSIS — M1712 Unilateral primary osteoarthritis, left knee: Secondary | ICD-10-CM | POA: Diagnosis not present

## 2021-08-24 DIAGNOSIS — M1712 Unilateral primary osteoarthritis, left knee: Secondary | ICD-10-CM | POA: Diagnosis not present

## 2021-08-24 DIAGNOSIS — M25551 Pain in right hip: Secondary | ICD-10-CM | POA: Diagnosis not present

## 2021-08-29 DIAGNOSIS — M25551 Pain in right hip: Secondary | ICD-10-CM | POA: Diagnosis not present

## 2021-08-29 DIAGNOSIS — M1712 Unilateral primary osteoarthritis, left knee: Secondary | ICD-10-CM | POA: Diagnosis not present

## 2021-09-01 DIAGNOSIS — M25551 Pain in right hip: Secondary | ICD-10-CM | POA: Diagnosis not present

## 2021-09-01 DIAGNOSIS — M1712 Unilateral primary osteoarthritis, left knee: Secondary | ICD-10-CM | POA: Diagnosis not present

## 2021-09-05 DIAGNOSIS — M25551 Pain in right hip: Secondary | ICD-10-CM | POA: Diagnosis not present

## 2021-09-05 DIAGNOSIS — M1712 Unilateral primary osteoarthritis, left knee: Secondary | ICD-10-CM | POA: Diagnosis not present

## 2021-09-07 DIAGNOSIS — J3081 Allergic rhinitis due to animal (cat) (dog) hair and dander: Secondary | ICD-10-CM | POA: Diagnosis not present

## 2021-09-07 DIAGNOSIS — J301 Allergic rhinitis due to pollen: Secondary | ICD-10-CM | POA: Diagnosis not present

## 2021-09-07 DIAGNOSIS — J3089 Other allergic rhinitis: Secondary | ICD-10-CM | POA: Diagnosis not present

## 2021-09-07 DIAGNOSIS — J4530 Mild persistent asthma, uncomplicated: Secondary | ICD-10-CM | POA: Diagnosis not present

## 2021-09-08 DIAGNOSIS — M1712 Unilateral primary osteoarthritis, left knee: Secondary | ICD-10-CM | POA: Diagnosis not present

## 2021-09-08 DIAGNOSIS — M25551 Pain in right hip: Secondary | ICD-10-CM | POA: Diagnosis not present

## 2021-09-14 DIAGNOSIS — M25551 Pain in right hip: Secondary | ICD-10-CM | POA: Diagnosis not present

## 2021-09-14 DIAGNOSIS — M1712 Unilateral primary osteoarthritis, left knee: Secondary | ICD-10-CM | POA: Diagnosis not present

## 2021-09-15 DIAGNOSIS — M1712 Unilateral primary osteoarthritis, left knee: Secondary | ICD-10-CM | POA: Diagnosis not present

## 2021-09-21 DIAGNOSIS — M1712 Unilateral primary osteoarthritis, left knee: Secondary | ICD-10-CM | POA: Diagnosis not present

## 2021-09-21 DIAGNOSIS — M25551 Pain in right hip: Secondary | ICD-10-CM | POA: Diagnosis not present

## 2021-09-22 DIAGNOSIS — M1712 Unilateral primary osteoarthritis, left knee: Secondary | ICD-10-CM | POA: Diagnosis not present

## 2021-09-23 DIAGNOSIS — M25551 Pain in right hip: Secondary | ICD-10-CM | POA: Diagnosis not present

## 2021-09-23 DIAGNOSIS — M1712 Unilateral primary osteoarthritis, left knee: Secondary | ICD-10-CM | POA: Diagnosis not present

## 2021-09-27 DIAGNOSIS — M25551 Pain in right hip: Secondary | ICD-10-CM | POA: Diagnosis not present

## 2021-09-27 DIAGNOSIS — M1712 Unilateral primary osteoarthritis, left knee: Secondary | ICD-10-CM | POA: Diagnosis not present

## 2021-09-30 DIAGNOSIS — M25551 Pain in right hip: Secondary | ICD-10-CM | POA: Diagnosis not present

## 2021-09-30 DIAGNOSIS — M1712 Unilateral primary osteoarthritis, left knee: Secondary | ICD-10-CM | POA: Diagnosis not present

## 2021-10-12 DIAGNOSIS — M1712 Unilateral primary osteoarthritis, left knee: Secondary | ICD-10-CM | POA: Diagnosis not present

## 2021-10-12 DIAGNOSIS — M25551 Pain in right hip: Secondary | ICD-10-CM | POA: Diagnosis not present

## 2021-10-17 DIAGNOSIS — M25551 Pain in right hip: Secondary | ICD-10-CM | POA: Diagnosis not present

## 2021-10-17 DIAGNOSIS — M1712 Unilateral primary osteoarthritis, left knee: Secondary | ICD-10-CM | POA: Diagnosis not present

## 2021-10-19 DIAGNOSIS — M7061 Trochanteric bursitis, right hip: Secondary | ICD-10-CM | POA: Diagnosis not present

## 2021-10-19 DIAGNOSIS — M170 Bilateral primary osteoarthritis of knee: Secondary | ICD-10-CM | POA: Diagnosis not present

## 2021-10-19 DIAGNOSIS — M25561 Pain in right knee: Secondary | ICD-10-CM | POA: Diagnosis not present

## 2021-11-01 DIAGNOSIS — M25551 Pain in right hip: Secondary | ICD-10-CM | POA: Diagnosis not present

## 2021-11-01 DIAGNOSIS — M1712 Unilateral primary osteoarthritis, left knee: Secondary | ICD-10-CM | POA: Diagnosis not present

## 2021-11-07 DIAGNOSIS — M1712 Unilateral primary osteoarthritis, left knee: Secondary | ICD-10-CM | POA: Diagnosis not present

## 2021-11-07 DIAGNOSIS — M25551 Pain in right hip: Secondary | ICD-10-CM | POA: Diagnosis not present

## 2021-11-09 DIAGNOSIS — M25551 Pain in right hip: Secondary | ICD-10-CM | POA: Diagnosis not present

## 2021-11-09 DIAGNOSIS — M1712 Unilateral primary osteoarthritis, left knee: Secondary | ICD-10-CM | POA: Diagnosis not present

## 2021-11-14 DIAGNOSIS — M25551 Pain in right hip: Secondary | ICD-10-CM | POA: Diagnosis not present

## 2021-11-14 DIAGNOSIS — M1712 Unilateral primary osteoarthritis, left knee: Secondary | ICD-10-CM | POA: Diagnosis not present

## 2021-11-17 DIAGNOSIS — M1712 Unilateral primary osteoarthritis, left knee: Secondary | ICD-10-CM | POA: Diagnosis not present

## 2021-11-17 DIAGNOSIS — M25551 Pain in right hip: Secondary | ICD-10-CM | POA: Diagnosis not present

## 2021-11-21 DIAGNOSIS — M25551 Pain in right hip: Secondary | ICD-10-CM | POA: Diagnosis not present

## 2021-11-21 DIAGNOSIS — M1712 Unilateral primary osteoarthritis, left knee: Secondary | ICD-10-CM | POA: Diagnosis not present

## 2021-11-24 DIAGNOSIS — M1711 Unilateral primary osteoarthritis, right knee: Secondary | ICD-10-CM | POA: Diagnosis not present

## 2021-12-01 DIAGNOSIS — M1711 Unilateral primary osteoarthritis, right knee: Secondary | ICD-10-CM | POA: Diagnosis not present

## 2021-12-02 ENCOUNTER — Other Ambulatory Visit: Payer: Self-pay | Admitting: Oncology

## 2021-12-02 DIAGNOSIS — E559 Vitamin D deficiency, unspecified: Secondary | ICD-10-CM | POA: Diagnosis not present

## 2021-12-02 DIAGNOSIS — Z170 Estrogen receptor positive status [ER+]: Secondary | ICD-10-CM

## 2021-12-02 DIAGNOSIS — I10 Essential (primary) hypertension: Secondary | ICD-10-CM | POA: Diagnosis not present

## 2021-12-02 DIAGNOSIS — E1129 Type 2 diabetes mellitus with other diabetic kidney complication: Secondary | ICD-10-CM | POA: Diagnosis not present

## 2021-12-02 DIAGNOSIS — E7800 Pure hypercholesterolemia, unspecified: Secondary | ICD-10-CM | POA: Diagnosis not present

## 2021-12-02 DIAGNOSIS — E0500 Thyrotoxicosis with diffuse goiter without thyrotoxic crisis or storm: Secondary | ICD-10-CM | POA: Diagnosis not present

## 2021-12-06 DIAGNOSIS — R002 Palpitations: Secondary | ICD-10-CM | POA: Diagnosis not present

## 2021-12-06 DIAGNOSIS — I493 Ventricular premature depolarization: Secondary | ICD-10-CM | POA: Diagnosis not present

## 2021-12-06 DIAGNOSIS — R82998 Other abnormal findings in urine: Secondary | ICD-10-CM | POA: Diagnosis not present

## 2021-12-06 DIAGNOSIS — N281 Cyst of kidney, acquired: Secondary | ICD-10-CM | POA: Diagnosis not present

## 2021-12-06 DIAGNOSIS — R0600 Dyspnea, unspecified: Secondary | ICD-10-CM | POA: Diagnosis not present

## 2021-12-06 DIAGNOSIS — Z1339 Encounter for screening examination for other mental health and behavioral disorders: Secondary | ICD-10-CM | POA: Diagnosis not present

## 2021-12-06 DIAGNOSIS — Z0000 Encounter for general adult medical examination without abnormal findings: Secondary | ICD-10-CM | POA: Diagnosis not present

## 2021-12-06 DIAGNOSIS — Z853 Personal history of malignant neoplasm of breast: Secondary | ICD-10-CM | POA: Diagnosis not present

## 2021-12-06 DIAGNOSIS — R413 Other amnesia: Secondary | ICD-10-CM | POA: Diagnosis not present

## 2021-12-06 DIAGNOSIS — Z1331 Encounter for screening for depression: Secondary | ICD-10-CM | POA: Diagnosis not present

## 2021-12-06 DIAGNOSIS — I10 Essential (primary) hypertension: Secondary | ICD-10-CM | POA: Diagnosis not present

## 2021-12-08 DIAGNOSIS — M1711 Unilateral primary osteoarthritis, right knee: Secondary | ICD-10-CM | POA: Diagnosis not present

## 2021-12-09 ENCOUNTER — Ambulatory Visit
Admission: RE | Admit: 2021-12-09 | Discharge: 2021-12-09 | Disposition: A | Discharge status: Home / Self Care | Payer: Medicare PPO | Source: Ambulatory Visit | Attending: Cardiology | Admitting: Cardiology

## 2021-12-09 ENCOUNTER — Ambulatory Visit: Payer: Medicare PPO | Admitting: Cardiology

## 2021-12-09 ENCOUNTER — Encounter: Payer: Self-pay | Admitting: Cardiology

## 2021-12-09 VITALS — BP 123/45 | HR 71 | Temp 97.5°F | Resp 16 | Ht 62.5 in | Wt 159.4 lb

## 2021-12-09 DIAGNOSIS — E7800 Pure hypercholesterolemia, unspecified: Secondary | ICD-10-CM

## 2021-12-09 DIAGNOSIS — M7989 Other specified soft tissue disorders: Secondary | ICD-10-CM | POA: Diagnosis not present

## 2021-12-09 DIAGNOSIS — I700 Atherosclerosis of aorta: Secondary | ICD-10-CM

## 2021-12-09 DIAGNOSIS — R0609 Other forms of dyspnea: Secondary | ICD-10-CM

## 2021-12-09 MED ORDER — SIMVASTATIN 40 MG PO TABS: 40.0000 mg | ORAL_TABLET | Freq: Every day | ORAL | 2 refills | Status: DC

## 2021-12-09 NOTE — Progress Notes (Signed)
Primary Physician/Referring:  Velna Hatchet, MD  Patient ID: Sarah Underwood, female    DOB: 1945-06-25, 76 y.o.   MRN: [id number]  No chief complaint on file.  HPI:    Sarah Underwood  is a 76 y.o. Caucasian female patient with hypertension, palpitations related to PACs and PVCs improved on metoprolol, history of breast cancer SP radiation therapy to the chest was seen by [place] >1-year ago for palpitations and hypertension presents here" coming back for evaluation and management of leg edema and dyspnea. Denies chest pain, palpitations, syncope, near syncope.  Past Medical History:  Diagnosis Date   Basal cell carcinoma 1994   right side of node   Benign positional vertigo    with rolling over    Dysrhythmia    Generalized anxiety disorder    GERD (gastroesophageal reflux disease)    Graves disease 2007   thyroid nodule   Hard of hearing    Heart murmur    History of breast cancer 1995   left - treated with mastectomy   History of radiation therapy 2001   37 sessions following recurrence of breast cancer in left chest wall   Hypertension    Knee pain, bilateral 12/14/2014   Major depressive disorder in remission    Mild persistent asthma, uncomplicated 72/62/0355   Nocturia    OA (osteoarthritis) of hip 09/08/2015   Ovarian mass    small solid nodule 8x44m: Right ovary - stable since 2000, follow u/s do not show this.   Renal mass 2021   followed by Dr. [facility].  Having every 6 month follow up   Seasonal allergies    Stress fracture of metatarsal bone of left foot 12/15/2019   Vegetarian diet    Past Surgical History:  Procedure Laterality Date   APPENDECTOMY     basal cell removal  7/94   right nose   BREAST BIOPSY Right 1987   benign fibroadenoma   breast cancer recurrence Left 01/2000   at site of left chest wall after previous mastectomy with removal and treatment with radiaton.   CARPAL TUNNEL RELEASE Right 05/08/2013   Procedure: RIGHT CARPAL TUNNEL  RELEASE;  Surgeon: RCammie Sickle, MD;  [facility]  Service: Orthopedics;  Laterality: Right;   CATARACT EXTRACTION     03/20/14 left eye, 12/15 right eye   COLPOSCOPY W/ BIOPSY / CURETTAGE  03/12/12   benign, but + HR HPV   EYE SURGERY     bilat cataract surgery    MASTECTOMY Left 3/95   Stage IIb 0/14 LN ER/ PR + with mastectomy - Dr. LMendel Ryder  MOHS SURGERY     right nare   [place]   Procedure: TOTAL HIP ARTHROPLASTY ANTERIOR APPROACH;  Surgeon: FGaynelle Arabian MD;  Location: [facility];  Service: Orthopedics;  Laterality: Right;     Social History   Tobacco Use   Smoking status: Never   Smokeless tobacco: Never  Substance Use Topics   Alcohol use: Yes    Comment: rare   Marital Status: Divorced  ROS  Review of Systems  Cardiovascular:  Positive for dyspnea on exertion and leg swelling. Negative for chest pain.   Objective  Blood pressure (!) 123/45, pulse 71, temperature (!) 97.5 F (36.4 C), temperature source Temporal, resp. rate 16, height 5' 2.5" (1.588 m), weight 159 lb 6.4  oz (72.3 kg), last menstrual period 04/25/2003, SpO2 98 %. Body mass index is 28.69 kg/m.     12/09/2021    9:32 AM 08/19/2021   11:32 AM 02/21/2021   10:02 AM  Vitals with BMI  Height 5' 2.5" 5' 2.5"   Weight 159 lbs 6 oz 158 lbs 10 oz   BMI 23.76 28.31   Systolic 517 616 073  Diastolic 45 63 74  Pulse 71 71 64     Physical Exam Neck:     Vascular: No carotid bruit or JVD.  Cardiovascular:     Rate and Rhythm: Normal rate and regular rhythm. No extrasystoles are present.    Pulses: Intact distal pulses.     Heart sounds: Normal heart sounds. No murmur heard.    No gallop.  Pulmonary:     Effort: Pulmonary effort is normal.     Breath sounds: Normal breath sounds.  Abdominal:     General: Bowel sounds are normal.     Palpations: Abdomen is soft.      Tenderness: There is no abdominal tenderness.  Musculoskeletal:     Right lower leg: Edema (1-2+ ankle edema, pitting) present.     Left lower leg: No edema (1+ ankle edema pitting).      Laboratory examination:   External labs:   Labs 12/06/2021:  Serum glucose 86 mg, BUN 17, creatinine 0.8, EGFR 69 mL, LFTs normal.  A1c 5.7%.  TSH normal at 2.34.  Vitamin D38.0.  Hb 13.0/HCT 36.4, platelets 300.  Total cholesterol 191, triglycerides 81, HDL 60, LDL 116.  Non-HDL cholesterol 131.  Apolipoprotein B 88.  Medications and allergies   Allergies  Allergen Reactions   Other Swelling    Trees   Molds & Smuts Other (See Comments)   Erythromycin Nausea And Vomiting   Penicillins Hives and Other (See Comments)    Has patient had a PCN reaction causing immediate rash, facial/tongue/throat swelling, SOB or lightheadedness with hypotension: no Has patient had a PCN reaction causing severe rash involving mucus membranes or skin necrosis: no Has patient had a PCN reaction that required hospitalization no Has patient had a PCN reaction occurring within the last 10 years: no If all of the above answers are "NO", then may proceed with Cephalosporin use.    Terbinafine And Related     rash   Hydrochlorothiazide Rash    Pt states caused a sunburn type reaction    Sulfa Antibiotics Rash    Medication list after today's encounter   Current Outpatient Medications:    acetaminophen (TYLENOL) 650 MG CR tablet, Take 1,300 mg by mouth as needed. , Disp: , Rfl:    albuterol (PROVENTIL HFA;VENTOLIN HFA) 108 (90 Base) MCG/ACT inhaler, Inhale 2 puffs into the lungs every 4 (four) hours as needed for wheezing., Disp: 1 Inhaler, Rfl: 3   amLODipine (NORVASC) 2.5 MG tablet, Take 1 tablet by mouth daily., Disp: , Rfl:    anastrozole (ARIMIDEX) 1 MG tablet, TAKE 1 TABLET BY MOUTH EVERY DAY, Disp: 90 tablet, Rfl: 3   Artificial Tear Solution (TEARS NATURALE OP), Place 2 drops into both eyes daily as  needed (For dry eyes.)., Disp: , Rfl:    Biotin 5000 MCG SUBL, Take 1 tablet by mouth See admin instructions., Disp: , Rfl:    budesonide-formoterol (SYMBICORT) 80-4.5 MCG/ACT inhaler, Inhale 2 puffs into the lungs 2 (two) times daily., Disp: , Rfl:    Cetirizine HCl (ZYRTEC ALLERGY PO), Take by mouth as needed. ,  Disp: , Rfl:    EPINEPHrine 0.3 mg/0.3 mL IJ SOAJ injection, See admin instructions., Disp: , Rfl:    glucosamine-chondroitin 500-400 MG tablet, Take 1 tablet by mouth in the morning and at bedtime. Lunch and supper, Disp: , Rfl:    ketotifen (ZADITOR) 0.025 % ophthalmic solution, Place 1 drop into both eyes daily., Disp: , Rfl:    MAGNESIUM CITRATE PO, Take 1 tablet by mouth 2 (two) times daily., Disp: , Rfl:    Menaquinone-7 (VITAMIN K2) 100 MCG CAPS, Take by mouth., Disp: , Rfl:    metoprolol succinate (TOPROL-XL) 50 MG 24 hr tablet, Take 1 tablet (50 mg total) by mouth daily. Take with or immediately following a meal., Disp: 90 tablet, Rfl: 3   Multiple Vitamin (MULTIVITAMIN) tablet, Take 1 tablet by mouth daily., Disp: , Rfl:    olmesartan (BENICAR) 40 MG tablet, , Disp: , Rfl:    simvastatin (ZOCOR) 40 MG tablet, Take 1 tablet (40 mg total) by mouth at bedtime., Disp: 30 tablet, Rfl: 2   VITAMIN D PO, Take 4,000 Int'l Units by mouth., Disp: , Rfl:    Radiology:   No results found.  Cardiac Studies:   Stress EKG 08/20/2013: Indications: Palpitations.  Conclusions: Negative for ischemia. Hypertensive.  The baseline ECG showed NSR,LVH. PAC frequent. During exercise there was no ST-T changes of ischemia. Symptoms: Dyspnea. THR met. Arrhythmia: PAC decreased with exercise.  Continue primary prevention. Needs therapy for hypertensive BP response The patient exercised according to the Bruce   protocol, Total time recorded    5    Min.    32    sec.  achieving a max heart rate of 144  which was  94% of MPHR for age and  7.3 METS of work. Baseline NIBP was 148/74. Peak NIBP was 220/82  MaxSysp was: 240 MaxDiasp was: 82.  Lexiscan myoview stress test 12/28/2017:  1. Lexiscan stress test was performed in addition to modified Bruce protocol. The patient performed modified Bruce protocol, reaching 71% of the maximum predicted heart rate. Normal hemodynamic response with limited exercise. Stress symptoms included dizziness. The resting electrocardiogram demonstrated normal sinus rhythm, normal resting conduction, no resting arrhythmias and nonspecific inferior ST-T changes.  Stress EKG is non diagnostic for ischemia as it is a pharmacologic stress. Stress electrocardiogram with modified Bruce protocol showed sinus tachycardia 106 bpm, normal stress conduction, no stress arrhythmias and nonspecific inferior ST-T changes.   2. The overall quality of the study is good. There is no evidence of abnormal lung activity. Stress and rest SPECT images demonstrate homogeneous tracer distribution throughout the myocardium. Gated SPECT imaging reveals normal myocardial thickening and wall motion. The left ventricular ejection fraction was normal (64%).   3. Low risk study.  PCV ECHOCARDIOGRAM COMPLETE 01/77/9390 Normal LV systolic function with visual EF 60-65%. Left ventricle cavity is normal in size. Normal global wall motion. Indeterminate diastolic filling pattern, normal LAP. Left atrial cavity is mildly dilated. Mild (Grade I) mitral regurgitation. Mild tricuspid regurgitation. Mild pulmonary hypertension. RVSP measures 40 mmHg. IVC is dilated with a respiratory response of <50%. Compared to study 01/16/2018: no significant change.   EKG:   EKG 12/09/2020 scratch that EKG 12/09/2021: Normal sinus rhythm at the rate of 73 bpm, normal axis.  Incomplete right bundle branch block.  No evidence of ischemia, normal EKG. Compared to the EKG done 11/10/2020, frequent PACs in bigeminal pattern not present.   Assessment     ICD-10-CM   1. Bilateral swelling of  feet  M79.89 EKG 12-Lead    2. Dyspnea  on exertion  R06.09 PCV ECHOCARDIOGRAM COMPLETE    DG Chest 2 View    PCV MYOCARDIAL PERFUSION WITH LEXISCAN    3. Abdominal aortic atherosclerosis ([place]) CT 2022  I70.0 simvastatin (ZOCOR) 40 MG tablet    PCV MYOCARDIAL PERFUSION WITH LEXISCAN    4. Hypercholesteremia  E78.00 simvastatin (ZOCOR) 40 MG tablet    PCV MYOCARDIAL PERFUSION WITH LEXISCAN    Lipid Panel With LDL/HDL Ratio      There are no discontinued medications.   Meds ordered this encounter  Medications   simvastatin (ZOCOR) 40 MG tablet    Sig: Take 1 tablet (40 mg total) by mouth at bedtime.    Dispense:  30 tablet    Refill:  2    Orders Placed This Encounter  Procedures   DG Chest 2 View    Standing Status:   Future    Standing Expiration Date:   02/08/2022    Order Specific Question:   Reason for Exam (SYMPTOM  OR DIAGNOSIS REQUIRED)    Answer:   Dyspnea    Order Specific Question:   Preferred imaging location?    Answer:   [facility]    Order Specific Question:   Radiology Contrast Protocol - do NOT remove file path    Answer:   [email or web address]   Lipid Panel With LDL/HDL Ratio   PCV MYOCARDIAL PERFUSION WITH LEXISCAN    Standing Status:   Future    Standing Expiration Date:   02/08/2022   EKG 12-Lead   PCV ECHOCARDIOGRAM COMPLETE    Standing Status:   Future    Standing Expiration Date:   12/10/2022     Recommendations:   JENNI THEW is a 76 y.o. Caucasian female patient with hypertension, palpitations related to PACs and PVCs improved on metoprolol, history of breast cancer SP radiation therapy to the chest was seen by [place] >1-year ago for palpitations and hypertension presents here" coming back for evaluation and management of leg edema and dyspnea.  Patient states that over the past few months she has been having increasing pedal edema especially in the ankles.  She has also noticed decreased exercise capacity and dyspnea on  exertion that is new in the past few months.  I reviewed her prior evaluations, her nuclear stress test was in 2019 which was nonischemic.  Echocardiogram at that time it revealed mild to moderate pulmonary hypertension.  In view of her presentation with worsening dyspnea, I recommended repeating echocardiogram.  She has normal EKG, to evaluate her aortic atherosclerosis noted on the CT scan, history of radiation therapy to the chest which increase the risk of proximal major epicardial coronary artery disease, was going to perform a routine treadmill exercise stress test in view of new dyspnea.  However she is not able to walk on the treadmill due to arthritis and also vertigo hence we will perform a nuclear stress test.  I reviewed her CT scan that was performed last year, abdominal aortic atherosclerosis was evident.  I have started her on simvastatin 40 mg in the evening after review of her external records which revealed mild hyperlipidemia.  I will see her back in 6 to 8 weeks and I will repeat lipid profile testing prior to her next office visit and also follow-up on the test results.     Adrian Prows, MD, [facility] 12/09/2021, 10:46 AM Office: [phone] Fax: [phone] Pager: [phone]

## 2021-12-13 ENCOUNTER — Ambulatory Visit: Payer: Medicare PPO

## 2021-12-13 ENCOUNTER — Telehealth: Payer: Self-pay

## 2021-12-13 DIAGNOSIS — I700 Atherosclerosis of aorta: Secondary | ICD-10-CM | POA: Diagnosis not present

## 2021-12-13 DIAGNOSIS — R0609 Other forms of dyspnea: Secondary | ICD-10-CM | POA: Diagnosis not present

## 2021-12-13 DIAGNOSIS — E7800 Pure hypercholesterolemia, unspecified: Secondary | ICD-10-CM

## 2021-12-13 NOTE — Telephone Encounter (Signed)
Patient wants to know if she can wear compression stockings. She was here for testing and noticed the mannequin leg with the stocking and wanted to know if you recommend her wearing them. Please advise.

## 2021-12-13 NOTE — Progress Notes (Signed)
CXR 2 views 12/11/21: Lungs are well expanded, symmetric, and clear. No pneumothorax or pleural effusion. Cardiac size within normal limits. Pulmonary vascularity is normal. Osseous structures are age-appropriate.  No acute bone abnormality. Surgical clips noted within the left axilla. Status post left mastectomy

## 2021-12-17 ENCOUNTER — Other Ambulatory Visit: Payer: Self-pay

## 2021-12-17 ENCOUNTER — Encounter (HOSPITAL_BASED_OUTPATIENT_CLINIC_OR_DEPARTMENT_OTHER): Payer: Self-pay | Admitting: Emergency Medicine

## 2021-12-17 ENCOUNTER — Emergency Department (HOSPITAL_BASED_OUTPATIENT_CLINIC_OR_DEPARTMENT_OTHER)
Admission: EM | Admit: 2021-12-17 | Discharge: 2021-12-18 | Disposition: A | Discharge status: Home / Self Care | Payer: Medicare PPO | Attending: Emergency Medicine | Admitting: Emergency Medicine

## 2021-12-17 DIAGNOSIS — Z79899 Other long term (current) drug therapy: Secondary | ICD-10-CM | POA: Diagnosis not present

## 2021-12-17 DIAGNOSIS — Z853 Personal history of malignant neoplasm of breast: Secondary | ICD-10-CM | POA: Diagnosis not present

## 2021-12-17 DIAGNOSIS — W260XXA Contact with knife, initial encounter: Secondary | ICD-10-CM | POA: Diagnosis not present

## 2021-12-17 DIAGNOSIS — S61211A Laceration without foreign body of left index finger without damage to nail, initial encounter: Secondary | ICD-10-CM | POA: Diagnosis not present

## 2021-12-17 DIAGNOSIS — I10 Essential (primary) hypertension: Secondary | ICD-10-CM | POA: Insufficient documentation

## 2021-12-17 DIAGNOSIS — S6992XA Unspecified injury of left wrist, hand and finger(s), initial encounter: Secondary | ICD-10-CM | POA: Diagnosis present

## 2021-12-17 NOTE — ED Triage Notes (Signed)
Lac to left pointer finger, approximately 1cm ending at nail.  Bleeding pressure dressing applied. +cap refill, +pulses Tetanus 2015 per pt

## 2021-12-18 NOTE — ED Provider Notes (Signed)
[place] EMERGENCY DEPT Provider Note  CSN: [id number] Arrival date & time: 12/17/21 2039  Chief Complaint(s) Laceration  HPI ILLIANNA Underwood is a 76 y.o. female     Laceration Location:  Finger Finger laceration location:  L index finger Length:  1 Depth:  Through dermis Quality: straight   Time since incident:  4 hours Laceration mechanism:  Knife Pain details:    Quality:  Aching   Severity:  Mild   Timing:  Constant Foreign body present:  No foreign bodies Tetanus status:  Up to date   Past Medical History Past Medical History:  Diagnosis Date   Basal cell carcinoma 1994   right side of node   Benign positional vertigo    with rolling over    Dysrhythmia    Generalized anxiety disorder    GERD (gastroesophageal reflux disease)    Graves disease 2007   thyroid nodule   Hard of hearing    Heart murmur    History of breast cancer 1995   left - treated with mastectomy   History of radiation therapy 2001   37 sessions following recurrence of breast cancer in left chest wall   Hypertension    Knee pain, bilateral 12/14/2014   Major depressive disorder in remission    Mild persistent asthma, uncomplicated 79/05/4095   Nocturia    OA (osteoarthritis) of hip 09/08/2015   Ovarian mass    small solid nodule 8x63m: Right ovary - stable since 2000, follow u/s do not show this.   Renal mass 2021   followed by Dr. [facility].  Having every 6 month follow up   Seasonal allergies    Stress fracture of metatarsal bone of left foot 12/15/2019   Vegetarian diet    Patient Active Problem List   Diagnosis Date Noted   Major depressive disorder in remission 03/29/2020   Generalized anxiety disorder    Benign positional vertigo    Hypertension    Heart murmur    Stress fracture of metatarsal bone of left foot 12/15/2019   History of revision of total replacement of right hip joint 11/26/2017   Leg edema, left 09/26/2016   Post-nasal drainage  03/30/2016   OA (osteoarthritis) of hip 09/08/2015   Mild persistent asthma, uncomplicated 035/32/9924  Hip pain, bilateral 12/14/2014   Knee pain, bilateral 12/14/2014   Postmenopausal atrophic vaginitis 02/23/2013   Graves disease 2007   History of radiation therapy 2001   History of breast cancer 1995   Basal cell cBlissMedication(s) Prior to Admission medications   Medication Sig Start Date End Date Taking? Authorizing Provider  acetaminophen (TYLENOL) 650 MG CR tablet Take 1,300 mg by mouth as needed.     [provider]  albuterol (PROVENTIL HFA;VENTOLIN HFA) 108 (90 Base) MCG/ACT inhaler Inhale 2 puffs into the lungs every 4 (four) hours as needed for wheezing. 10/30/17   RBrand Males MD  amLODipine (NORVASC) 2.5 MG tablet Take 1 tablet by mouth daily. 07/20/18   [provider]  anastrozole (ARIMIDEX) 1 MG tablet TAKE 1 TABLET BY MOUTH EVERY DAY 12/02/21   SLadell Pier MD  Artificial Tear Solution (TEARS NATURALE OP) Place 2 drops into both eyes daily as needed (For dry eyes.).    [provider]  Biotin 5000 MCG SUBL Take 1 tablet by mouth See admin instructions.    [provider]  budesonide-formoterol (SYMBICORT) 80-4.5 MCG/ACT inhaler Inhale 2 puffs into the lungs 2 (two)  times daily.    [provider]  Cetirizine HCl (ZYRTEC ALLERGY PO) Take by mouth as needed.     [provider]  EPINEPHrine 0.3 mg/0.3 mL IJ SOAJ injection See admin instructions.    [provider]  glucosamine-chondroitin 500-400 MG tablet Take 1 tablet by mouth in the morning and at bedtime. Lunch and supper    [provider]  ketotifen (ZADITOR) 0.025 % ophthalmic solution Place 1 drop into both eyes daily.    [provider]  MAGNESIUM CITRATE PO Take 1 tablet by mouth 2 (two) times daily.    [provider]  Menaquinone-7 (VITAMIN K2) 100 MCG CAPS Take by mouth.    [provider]   metoprolol succinate (TOPROL-XL) 50 MG 24 hr tablet Take 1 tablet (50 mg total) by mouth daily. Take with or immediately following a meal. 12/21/20 12/16/21  Adrian Prows, MD  Multiple Vitamin (MULTIVITAMIN) tablet Take 1 tablet by mouth daily.    [provider]  olmesartan (BENICAR) 40 MG tablet  11/27/19   [provider]  simvastatin (ZOCOR) 40 MG tablet Take 1 tablet (40 mg total) by mouth at bedtime. 12/09/21 03/09/22  Adrian Prows, MD  VITAMIN D PO Take 4,000 Int'l Units by mouth.    [provider]                                                                                                                                    Allergies Other, Molds & smuts, Erythromycin, Penicillins, Terbinafine and related, Hydrochlorothiazide, and Sulfa antibiotics  Review of Systems Review of Systems As noted in HPI  Physical Exam Vital Signs  I have reviewed the triage vital signs BP (!) 177/81 (BP Location: Right Arm)   Pulse 76   Temp 98.4 F (36.9 C) (Oral)   Resp 18   LMP 04/25/2003   SpO2 96%   Physical Exam Vitals reviewed.  Constitutional:      General: She is not in acute distress.    Appearance: She is well-developed. She is not diaphoretic.  HENT:     Head: Normocephalic and atraumatic.     Right Ear: External ear normal.     Left Ear: External ear normal.     Nose: Nose normal.  Eyes:     General: No scleral icterus.    Conjunctiva/sclera: Conjunctivae normal.  Neck:     Trachea: Phonation normal.  Cardiovascular:     Rate and Rhythm: Normal rate and regular rhythm.  Pulmonary:     Effort: Pulmonary effort is normal. No respiratory distress.     Breath sounds: No stridor.  Abdominal:     General: There is no distension.  Musculoskeletal:        General: Normal range of motion.     Left hand: Laceration present.       Hands:     Cervical back: Normal range of motion.  Neurological:     Mental Status: She is alert and oriented to person,  place, and time.  Psychiatric:        Behavior: Behavior normal.     ED Results and Treatments Labs (all labs ordered are listed, but only abnormal results are displayed) Labs Reviewed - No data to display                                                                                                                       EKG  EKG Interpretation  Date/Time:    Ventricular Rate:    PR Interval:    QRS Duration:   QT Interval:    QTC Calculation:   R Axis:     Text Interpretation:         Radiology No results found.  Medications Ordered in ED Medications - No data to display                                                                                                                                   Procedures .Marland KitchenLaceration Repair  Date/Time: 12/18/2021 12:26 AM  Performed by: Fatima Blank, MD Authorized by: Fatima Blank, MD   Consent:    Consent obtained:  Verbal   Consent given by:  Patient   Risks discussed:  Infection, pain, poor cosmetic result and poor wound healing Universal protocol:    Patient identity confirmed:  Arm band Laceration details:    Location:  Finger   Finger location:  L index finger   Length (cm):  1   Depth (mm):  2 Exploration:    Hemostasis achieved with:  Direct pressure   Wound extent: no fascia violation noted, no foreign bodies/material noted and no muscle damage noted   Treatment:    Area cleansed with:  Saline   Irrigation solution:  Sterile saline   Irrigation volume:  200cc   Irrigation method:  Pressure wash Skin repair:    Repair method:  Steri-Strips   Number of Steri-Strips:  1 Approximation:    Approximation:  Close Repair type:    Repair type:  Simple Post-procedure details:    Dressing:  Bulky dressing   Procedure completion:  Tolerated   (including critical care time)  Medical Decision Making / ED Course   Medical Decision Making   Finger laceration irrigated and closed as  above. Depth not concerning for bony involvement requiring imaging  at this time.       Final Clinical Impression(s) / ED Diagnoses Final diagnoses:  Laceration of left index finger without foreign body without damage to nail, initial encounter   The patient appears reasonably screened and/or stabilized for discharge and I doubt any other medical condition or other [facility] requiring further screening, evaluation, or treatment in the ED at this time. I have discussed the findings, Dx and Tx plan with the patient/family who expressed understanding and agree(s) with the plan. Discharge instructions discussed at length. The patient/family was given strict return precautions who verbalized understanding of the instructions. No further questions at time of discharge.  Disposition: Discharge  Condition: Good  ED Discharge Orders     None       Follow Up: Velna Hatchet, [address] [place] [address] [phone]  Call  to schedule an appointment for close follow up           This chart was dictated using voice recognition software.  Despite best efforts to proofread,  errors can occur which can change the documentation meaning.    Fatima Blank, MD 12/18/21 Shelah Lewandowsky

## 2021-12-18 NOTE — Discharge Instructions (Addendum)
Do not let your laceration (cut) get wet for the next 48 hours. After that you may allow soapy water to drain down the wound to clean it. Please do not scrub.  To minimize scarring, you can apply a vaseline based ointment for the next 2 weeks and keep it out of direct sun light. After that, you may apply sunscreen for the next several months. Your steristrip can be removed in 7-10 days. Return if your wound appears to be infected (see laceration care instructions).

## 2021-12-30 ENCOUNTER — Ambulatory Visit: Payer: Medicare PPO

## 2021-12-30 DIAGNOSIS — R0609 Other forms of dyspnea: Secondary | ICD-10-CM

## 2022-01-06 ENCOUNTER — Ambulatory Visit (INDEPENDENT_AMBULATORY_CARE_PROVIDER_SITE_OTHER): Payer: Medicare PPO | Admitting: Obstetrics & Gynecology

## 2022-01-06 ENCOUNTER — Other Ambulatory Visit (HOSPITAL_COMMUNITY)
Admission: RE | Admit: 2022-01-06 | Discharge: 2022-01-06 | Disposition: A | Discharge status: Home / Self Care | Payer: Medicare PPO | Source: Ambulatory Visit | Attending: Obstetrics & Gynecology | Admitting: Obstetrics & Gynecology

## 2022-01-06 ENCOUNTER — Encounter (HOSPITAL_BASED_OUTPATIENT_CLINIC_OR_DEPARTMENT_OTHER): Payer: Self-pay | Admitting: Obstetrics & Gynecology

## 2022-01-06 VITALS — BP 157/67 | HR 72 | Ht 62.75 in | Wt 157.8 lb

## 2022-01-06 DIAGNOSIS — M8580 Other specified disorders of bone density and structure, unspecified site: Secondary | ICD-10-CM

## 2022-01-06 DIAGNOSIS — Z170 Estrogen receptor positive status [ER+]: Secondary | ICD-10-CM | POA: Diagnosis not present

## 2022-01-06 DIAGNOSIS — L292 Pruritus vulvae: Secondary | ICD-10-CM | POA: Diagnosis not present

## 2022-01-06 DIAGNOSIS — N952 Postmenopausal atrophic vaginitis: Secondary | ICD-10-CM

## 2022-01-06 DIAGNOSIS — C50912 Malignant neoplasm of unspecified site of left female breast: Secondary | ICD-10-CM | POA: Diagnosis not present

## 2022-01-06 DIAGNOSIS — N9489 Other specified conditions associated with female genital organs and menstrual cycle: Secondary | ICD-10-CM

## 2022-01-06 DIAGNOSIS — Z9189 Other specified personal risk factors, not elsewhere classified: Secondary | ICD-10-CM

## 2022-01-06 MED ORDER — TRIAMCINOLONE ACETONIDE 0.5 % EX OINT: 1.0000 | TOPICAL_OINTMENT | Freq: Two times a day (BID) | CUTANEOUS | 0 refills | Status: DC

## 2022-01-06 NOTE — Progress Notes (Unsigned)
76 y.o. G26P1001 Divorced White or Caucasian female here for breast and pelvic exam.  I am also following her for h/o breast cancer and h/o recurrence.  She is still seeing oncology.  Last appt was in April.   Having some new issues with swelling in her ankles.  Had appt with Dr. Einar Gip in August.  Has cardiac evaluation showing some mild diastolic dysfunction was noted.    Does have a renal lesion that is being monitored by Dr. Tresa Moore.  Denies vaginal bleeding but has had a little external itching and some vaginal discharge the past couple of weeks.    Patient's last menstrual period was 04/25/2003.          Sexually active: No.  H/O STD:  no  Health Maintenance: PCP:  [place].  Last wellness appt was this summer.  Did blood work at that appt: yes Vaccines are up to date:  pt aware I do not have all of her vaccines records Colonoscopy:  05/03/2020 MMG:  01/15/2020 Negative BMD:  01/15/2020 Last pap smear:  10/04/2018 Negative.   H/o abnormal pap smear:  04/2018    reports that she has never smoked. She has never used smokeless tobacco. She reports current alcohol use. She reports that she does not use drugs.  Past Medical History:  Diagnosis Date   Basal cell carcinoma 1994   right side of node   Benign positional vertigo    with rolling over    Dysrhythmia    Generalized anxiety disorder    GERD (gastroesophageal reflux disease)    Graves disease 2007   thyroid nodule   Hard of hearing    Heart murmur    History of breast cancer 1995   left - treated with mastectomy   History of radiation therapy 2001   37 sessions following recurrence of breast cancer in left chest wall   Hypertension    Knee pain, bilateral 12/14/2014   Major depressive disorder in remission    Mild persistent asthma, uncomplicated 95/12/3265   Nocturia    OA (osteoarthritis) of hip 09/08/2015   Ovarian mass    small solid nodule 8x63m: Right ovary - stable since 2000, follow u/s do not show this.    Renal mass 2021   followed by Dr. [facility].  Having every 6 month follow up   Seasonal allergies    Stress fracture of metatarsal bone of left foot 12/15/2019   Vegetarian diet     Past Surgical History:  Procedure Laterality Date   APPENDECTOMY     basal cell removal  7/94   right nose   BREAST BIOPSY Right 1987   benign fibroadenoma   breast cancer recurrence Left 01/2000   at site of left chest wall after previous mastectomy with removal and treatment with radiaton.   CARPAL TUNNEL RELEASE Right 05/08/2013   Procedure: RIGHT CARPAL TUNNEL RELEASE;  Surgeon: RCammie Sickle, MD;  [facility]  Service: Orthopedics;  Laterality: Right;   CATARACT EXTRACTION     03/20/14 left eye, 12/15 right eye   COLPOSCOPY W/ BIOPSY / CURETTAGE  03/12/12   benign, but + HR HPV   EYE SURGERY     bilat cataract surgery    MASTECTOMY Left 3/95   Stage IIb 0/14 LN ER/ PR + with mastectomy - Dr. LMendel Ryder  MOHS SURGERY     right nare   [place]  [place] 09/08/2015   Procedure: TOTAL HIP ARTHROPLASTY ANTERIOR APPROACH;  Surgeon: Gaynelle Arabian, MD;  Location: [facility];  Service: Orthopedics;  Laterality: Right;    Current Outpatient Medications  Medication Sig Dispense Refill   acetaminophen (TYLENOL) 650 MG CR tablet Take 1,300 mg by mouth as needed.      albuterol (PROVENTIL HFA;VENTOLIN HFA) 108 (90 Base) MCG/ACT inhaler Inhale 2 puffs into the lungs every 4 (four) hours as needed for wheezing. 1 Inhaler 3   amLODipine (NORVASC) 2.5 MG tablet Take 1 tablet by mouth daily.     anastrozole (ARIMIDEX) 1 MG tablet TAKE 1 TABLET BY MOUTH EVERY DAY 90 tablet 3   Artificial Tear Solution (TEARS NATURALE OP) Place 2 drops into both eyes daily as needed (For dry eyes.).     Biotin 5000 MCG SUBL Take 1 tablet by mouth See admin instructions.     budesonide-formoterol (SYMBICORT) 80-4.5 MCG/ACT  inhaler Inhale 2 puffs into the lungs 2 (two) times daily.     Cetirizine HCl (ZYRTEC ALLERGY PO) Take by mouth as needed.      EPINEPHrine 0.3 mg/0.3 mL IJ SOAJ injection See admin instructions.     glucosamine-chondroitin 500-400 MG tablet Take 1 tablet by mouth in the morning and at bedtime. Lunch and supper     ketotifen (ZADITOR) 0.025 % ophthalmic solution Place 1 drop into both eyes daily.     MAGNESIUM CITRATE PO Take 1 tablet by mouth 2 (two) times daily.     Menaquinone-7 (VITAMIN K2) 100 MCG CAPS Take by mouth.     Multiple Vitamin (MULTIVITAMIN) tablet Take 1 tablet by mouth daily.     olmesartan (BENICAR) 40 MG tablet      simvastatin (ZOCOR) 40 MG tablet Take 1 tablet (40 mg total) by mouth at bedtime. 30 tablet 2   VITAMIN D PO Take 4,000 Int'l Units by mouth.     metoprolol succinate (TOPROL-XL) 50 MG 24 hr tablet Take 1 tablet (50 mg total) by mouth daily. Take with or immediately following a meal. 90 tablet 3   No current facility-administered medications for this visit.    Family History  Problem Relation Age of Onset   Breast cancer Mother 58       deceased 52   Osteoporosis Mother    Hypertension Mother    Dementia Mother        unspecified type   Mental illness Mother    Hypertension Father    Heart attack Father    Dementia Father        vascular dementia with history of "mini-strokes"   Breast cancer Sister 45       bilateral; recurrent 26 years late   Diabetes Brother    Lung cancer Maternal Grandfather        deceased 55   Cancer Paternal Grandmother        duodenal; deceased 47    Review of Systems  All other systems reviewed and are negative.   Exam:   BP (!) 157/67 (BP Location: Right Arm, Patient Position: Sitting, Cuff Size: Large)   Pulse 72   Ht 5' 2.75" (1.594 m) Comment: Reported  Wt 157 lb 12.8 oz (71.6 kg)   LMP 04/25/2003   BMI 28.18 kg/m   Height: 5' 2.75" (159.4 cm) (Reported)  General appearance: alert, cooperative and  appears stated age Breasts: {Exam; breast:13139::"normal appearance, no masses or tenderness"} Abdomen: soft, non-tender; bowel sounds normal; no masses,  no organomegaly Lymph nodes: Cervical, supraclavicular, and axillary nodes normal.  No abnormal inguinal nodes palpated Neurologic: Grossly normal  Pelvic: External genitalia:  no lesions              Urethra:  normal appearing urethra with no masses, tenderness or lesions              Bartholins and Skenes: normal                 Vagina: normal appearing vagina with atrophic changes and no discharge, no lesions              Cervix: no lesions              Pap taken: No. Bimanual Exam:  Uterus:  normal size, contour, position, consistency, mobility, non-tender              Adnexa: normal adnexa and no mass, fullness, tenderness               Rectovaginal: Confirms               Anus:  normal sphincter tone, no lesions  Chaperone, Octaviano Batty, CMA, was present for exam.  Assessment/Plan: 1. GYN exam for high-risk Medicare patient ***  2. Osteopenia, unspecified location ***  3. Postmenopausal atrophic vaginitis ***  4. Malignant neoplasm of left breast in female, estrogen receptor positive, unspecified site of breast (HCC) ***  5. Adnexal mass ***

## 2022-01-09 LAB — CERVICOVAGINAL ANCILLARY ONLY
Bacterial Vaginitis (gardnerella): NEGATIVE
Candida Glabrata: NEGATIVE
Candida Vaginitis: NEGATIVE
Comment: NEGATIVE
Comment: NEGATIVE
Comment: NEGATIVE

## 2022-01-12 ENCOUNTER — Encounter (HOSPITAL_BASED_OUTPATIENT_CLINIC_OR_DEPARTMENT_OTHER): Payer: Self-pay | Admitting: *Deleted

## 2022-01-13 DIAGNOSIS — E7800 Pure hypercholesterolemia, unspecified: Secondary | ICD-10-CM | POA: Diagnosis not present

## 2022-01-14 LAB — LIPID PANEL WITH LDL/HDL RATIO
Cholesterol, Total: 171 mg/dL (ref 100–199)
HDL: 66 mg/dL
LDL Chol Calc (NIH): 93 mg/dL (ref 0–99)
LDL/HDL Ratio: 1.4 ratio (ref 0.0–3.2)
Triglycerides: 60 mg/dL (ref 0–149)
VLDL Cholesterol Cal: 12 mg/dL (ref 5–40)

## 2022-01-19 ENCOUNTER — Ambulatory Visit: Payer: Medicare PPO | Admitting: Cardiology

## 2022-01-19 ENCOUNTER — Encounter: Payer: Self-pay | Admitting: Cardiology

## 2022-01-19 VITALS — BP 134/69 | HR 69 | Temp 97.4°F | Resp 16 | Ht 62.0 in | Wt 156.4 lb

## 2022-01-19 DIAGNOSIS — M7989 Other specified soft tissue disorders: Secondary | ICD-10-CM | POA: Diagnosis not present

## 2022-01-19 DIAGNOSIS — R0609 Other forms of dyspnea: Secondary | ICD-10-CM | POA: Diagnosis not present

## 2022-01-19 DIAGNOSIS — E7800 Pure hypercholesterolemia, unspecified: Secondary | ICD-10-CM

## 2022-01-19 DIAGNOSIS — I10 Essential (primary) hypertension: Secondary | ICD-10-CM

## 2022-01-19 DIAGNOSIS — I700 Atherosclerosis of aorta: Secondary | ICD-10-CM

## 2022-01-19 NOTE — Progress Notes (Signed)
Primary Physician/Referring:  Velna Hatchet, MD  Patient ID: Sarah Underwood, female    DOB: [birth year], 76 y.o.   MRN: [id number]  No chief complaint on file.  HPI:    Sarah Underwood  is a 76 y.o. Caucasian female patient with hypertension, palpitations related to PACs and PVCs improved on metoprolol, history of breast cancer SP radiation therapy to the chest was seen by [place] >1-year ago for palpitations and hypertension presents here" coming back for evaluation and management of leg edema and dyspnea. Denies chest pain, palpitations, syncope, near syncope.  I seen her 6 weeks ago, stress test and echocardiogram along with chest x-ray.  Patient has noticed marked improvement in overall wellbeing, leg edema has resolved and blood pressures normalized since she made lifestyle changes after she had discussions with me.  She is now eating less salty food, has given up on ice creams and high fat diets and has substituted green leafy vegetables and also has started to exercise on a daily basis.  States that she is doing well and specifically has also noticed improvement in dyspnea with continued exercise.  Past Medical History:  Diagnosis Date   Basal cell carcinoma 1994   right side of node   Benign positional vertigo    with rolling over    Dysrhythmia    Generalized anxiety disorder    GERD (gastroesophageal reflux disease)    Graves disease 2007   thyroid nodule   Hard of hearing    Heart murmur    History of breast cancer 1995   left - treated with mastectomy   History of radiation therapy 2001   37 sessions following recurrence of breast cancer in left chest wall   Hypertension    Knee pain, bilateral 12/14/2014   Major depressive disorder in remission    Mild persistent asthma, uncomplicated 76/79/7282   Nocturia    OA (osteoarthritis) of hip 09/08/2015   Ovarian mass    small solid nodule 8x29m: Right ovary - stable since 2000, follow u/s do not show this.   Renal mass  2021   followed by Dr. [facility].  Having every 6 month follow up   Seasonal allergies    Stress fracture of metatarsal bone of left foot 12/15/2019   Vegetarian diet    Past Surgical History:  Procedure Laterality Date   APPENDECTOMY     basal cell removal  7/94   right nose   BREAST BIOPSY Right 1987   benign fibroadenoma   breast cancer recurrence Left 01/2000   at site of left chest wall after previous mastectomy with removal and treatment with radiaton.   CARPAL TUNNEL RELEASE Right 05/08/2013   Procedure: RIGHT CARPAL TUNNEL RELEASE;  Surgeon: RCammie Sickle, MD;  [facility]  Service: Orthopedics;  Laterality: Right;   CATARACT EXTRACTION     03/20/14 left eye, 12/15 right eye   COLPOSCOPY W/ BIOPSY / CURETTAGE  03/12/12   benign, but + HR HPV   EYE SURGERY     bilat cataract surgery    MASTECTOMY Left 3/95   Stage IIb 0/14 LN ER/ PR + with mastectomy - Dr. LMendel Ryder  MOHS SURGERY     right nare   [place] 09/08[place]   Procedure: TOTAL HIP ARTHROPLASTY ANTERIOR APPROACH;  Surgeon: FGaynelle Arabian MD;  Location: [facility];  Service:  Orthopedics;  Laterality: Right;     Social History   Tobacco Use   Smoking status: Never   Smokeless tobacco: Never  Substance Use Topics   Alcohol use: Yes    Comment: rare   Marital Status: Divorced  ROS  Review of Systems  Cardiovascular:  Positive for dyspnea on exertion. Negative for chest pain and leg swelling.   Objective  Blood pressure 134/69, pulse 69, temperature (!) 97.4 F (36.3 C), temperature source Temporal, resp. rate 16, height _0  (1.575 m), weight 156 lb 6.4 oz (70.9 kg), last menstrual period 04/25/2003, SpO2 99 %. Body mass index is 28.61 kg/m.     01/19/2022   10:01 AM 01/06/2022    9:51 AM 12/18/2021   12:34 AM  Vitals with BMI  Height _1  5' 2.75"   Weight 156 lbs 6 oz 157 lbs 13 oz    BMI 24.5 80.99   Systolic 833 825 053  Diastolic 69 67 82  Pulse 69 72 64     Physical Exam Neck:     Vascular: No carotid bruit or JVD.  Cardiovascular:     Rate and Rhythm: Normal rate and regular rhythm. No extrasystoles are present.    Pulses: Intact distal pulses.     Heart sounds: Normal heart sounds. No murmur heard.    No gallop.  Pulmonary:     Effort: Pulmonary effort is normal.     Breath sounds: Normal breath sounds.  Abdominal:     General: Bowel sounds are normal.     Palpations: Abdomen is soft.  Musculoskeletal:     Right lower leg: No edema.     Left lower leg: No edema.      Laboratory examination:   Lab Results  Component Value Date   CHOL 171 01/13/2022   HDL 66 01/13/2022   LDLCALC 93 01/13/2022   TRIG 60 01/13/2022    External labs:   Labs 12/06/2021:  Serum glucose 86 mg, BUN 17, creatinine 0.8, EGFR 69 mL, LFTs normal.  A1c 5.7%.  TSH normal at 2.34.  Vitamin D38.0.  Hb 13.0/HCT 36.4, platelets 300.  Total cholesterol 191, triglycerides 81, HDL 60, LDL 116.  Non-HDL cholesterol 131.  Apolipoprotein B 88.  Medications and allergies   Allergies  Allergen Reactions   Other Swelling    Trees   Molds & Smuts Other (See Comments)   Erythromycin Nausea And Vomiting   Penicillins Hives and Other (See Comments)    Has patient had a PCN reaction causing immediate rash, facial/tongue/throat swelling, SOB or lightheadedness with hypotension: no Has patient had a PCN reaction causing severe rash involving mucus membranes or skin necrosis: no Has patient had a PCN reaction that required hospitalization no Has patient had a PCN reaction occurring within the last 10 years: no If all of the above answers are "NO", then may proceed with Cephalosporin use.    Terbinafine And Related     rash   Hydrochlorothiazide Rash    Pt states caused a sunburn type reaction    Sulfa Antibiotics Rash    Medication list after today's encounter   Current  Outpatient Medications:    acetaminophen (TYLENOL) 650 MG CR tablet, Take 1,300 mg by mouth as needed. , Disp: , Rfl:    albuterol (PROVENTIL HFA;VENTOLIN HFA) 108 (90 Base) MCG/ACT inhaler, Inhale 2 puffs into the lungs every 4 (four) hours as needed for wheezing., Disp: 1 Inhaler, Rfl: 3   amLODipine (NORVASC) 2.5 MG tablet,  Take 1 tablet by mouth daily., Disp: , Rfl:    anastrozole (ARIMIDEX) 1 MG tablet, TAKE 1 TABLET BY MOUTH EVERY DAY, Disp: 90 tablet, Rfl: 3   Artificial Tear Solution (TEARS NATURALE OP), Place 2 drops into both eyes daily as needed (For dry eyes.)., Disp: , Rfl:    Biotin 5000 MCG SUBL, Take 1 tablet by mouth See admin instructions., Disp: , Rfl:    budesonide-formoterol (SYMBICORT) 80-4.5 MCG/ACT inhaler, Inhale 2 puffs into the lungs 2 (two) times daily., Disp: , Rfl:    Cetirizine HCl (ZYRTEC ALLERGY PO), Take by mouth as needed. , Disp: , Rfl:    EPINEPHrine 0.3 mg/0.3 mL IJ SOAJ injection, See admin instructions., Disp: , Rfl:    ketotifen (ZADITOR) 0.025 % ophthalmic solution, Place 1 drop into both eyes daily., Disp: , Rfl:    MAGNESIUM CITRATE PO, Take 1 tablet by mouth 2 (two) times daily., Disp: , Rfl:    Menaquinone-7 (VITAMIN K2) 100 MCG CAPS, Take by mouth., Disp: , Rfl:    metoprolol succinate (TOPROL-XL) 50 MG 24 hr tablet, Take 1 tablet (50 mg total) by mouth daily. Take with or immediately following a meal., Disp: 90 tablet, Rfl: 3   olmesartan (BENICAR) 40 MG tablet, , Disp: , Rfl:    triamcinolone ointment (KENALOG) 0.5 %, Apply 1 Application topically 2 (two) times daily. Use as needed.  Don't use for more than 5 days continuously., Disp: 30 g, Rfl: 0   VITAMIN D PO, Take 4,000 Int'l Units by mouth., Disp: , Rfl:    simvastatin (ZOCOR) 20 MG tablet, Take 1 tablet (20 mg total) by mouth at bedtime., Disp: 30 tablet, Rfl: 2   Radiology:   CXR 2 views 12/11/21: Lungs are well expanded, symmetric, and clear. No pneumothorax or pleural effusion. Cardiac  size within normal limits. Pulmonary vascularity is normal. Osseous structures are age-appropriate.  No acute bone abnormality. Surgical clips noted within the left axilla. Status post left mastectomy  Cardiac Studies:   Stress EKG 08/20/2013: Indications: Palpitations.  Conclusions: Negative for ischemia. Hypertensive.  The baseline ECG showed NSR,LVH. PAC frequent. During exercise there was no ST-T changes of ischemia. Symptoms: Dyspnea. THR met. Arrhythmia: PAC decreased with exercise.  Continue primary prevention. Needs therapy for hypertensive BP response The patient exercised according to the Bruce   protocol, Total time recorded    5    Min.    32    sec.  achieving a max heart rate of 144  which was  94% of MPHR for age and  7.3 METS of work. Baseline NIBP was 148/74. Peak NIBP was 220/82 MaxSysp was: 240 MaxDiasp was: 82.  Lexiscan myoview stress test 12/28/2017:  1. Lexiscan stress test was performed in addition to modified Bruce protocol. The patient performed modified Bruce protocol, reaching 71% of the maximum predicted heart rate. Normal hemodynamic response with limited exercise. Stress symptoms included dizziness. The resting electrocardiogram demonstrated normal sinus rhythm, normal resting conduction, no resting arrhythmias and nonspecific inferior ST-T changes.  Stress EKG is non diagnostic for ischemia as it is a pharmacologic stress. Stress electrocardiogram with modified Bruce protocol showed sinus tachycardia 106 bpm, normal stress conduction, no stress arrhythmias and nonspecific inferior ST-T changes.   2. The overall quality of the study is good. There is no evidence of abnormal lung activity. Stress and rest SPECT images demonstrate homogeneous tracer distribution throughout the myocardium. Gated SPECT imaging reveals normal myocardial thickening and wall motion. The left ventricular ejection  fraction was normal (64%).   3. Low risk study.  PCV ECHOCARDIOGRAM COMPLETE  16/04/930 Normal LV systolic function with visual EF 60-65%. Left ventricle cavity is normal in size. Normal global wall motion. Indeterminate diastolic filling pattern, normal LAP. Left atrial cavity is mildly dilated. Mild (Grade I) mitral regurgitation. Mild tricuspid regurgitation. Mild pulmonary hypertension. RVSP measures 40 mmHg. IVC is dilated with a respiratory response of <50%. Compared to study 01/16/2018: no significant change.  PCV ECHOCARDIOGRAM COMPLETE 12/30/2021  Narrative Echocardiogram 01/01/2022: Left ventricle cavity is normal in size and wall thickness. Normal global wall motion. Normal LV systolic function with EF 65%. Doppler evidence of grade I (impaired) diastolic dysfunction, normal LAP. Left atrial cavity is mildly dilated. Mild tricuspid regurgitation. No evidence of pulmonary hypertension. Compared to previous study on 11/12/2020, mild PH now absent.  PCV MYOCARDIAL PERFUSION WITH LEXISCAN 12/13/2021  Narrative Lexiscan (with Mod Bruce protocol) Nuclear stress test 12/13/21 Myocardial perfusion is normal. Overall LV systolic function is normal without regional wall motion abnormalities. Stress LV EF: 48% but visually appears normal. Low risk study. Nondiagnostic ECG stress. The heart rate response was consistent with Regadenoson. The blood pressure response was physiologic. No previous exam available for comparison.     EKG:   EKG 12/09/2020 scratch that EKG 12/09/2021: Normal sinus rhythm at the rate of 73 bpm, normal axis.  Incomplete right bundle branch block.  No evidence of ischemia, normal EKG. Compared to the EKG done 11/10/2020, frequent PACs in bigeminal pattern not present.   Assessment     ICD-10-CM   1. Dyspnea on exertion  R06.09     2. Bilateral swelling of feet  M79.89     3. Hypercholesteremia  E78.00 simvastatin (ZOCOR) 20 MG tablet    Lipid Panel With LDL/HDL Ratio    Lipid Panel With LDL/HDL Ratio    4. Primary hypertension  I10      5. Abdominal aortic atherosclerosis ([place]) CT 2022  I70.0 simvastatin (ZOCOR) 20 MG tablet      Medications Discontinued During This Encounter  Medication Reason   glucosamine-chondroitin 500-400 MG tablet    Multiple Vitamin (MULTIVITAMIN) tablet    simvastatin (ZOCOR) 40 MG tablet      No orders of the defined types were placed in this encounter.   Orders Placed This Encounter  Procedures   Lipid Panel With LDL/HDL Ratio    Please do the lab work in 1 month from now approximately sometime in October or beginning of November 2023    Standing Status:   Future    Number of Occurrences:   1    Standing Expiration Date:   01/20/2023     Recommendations:   Sarah Underwood is a 76 y.o. Caucasian female patient with hypertension, palpitations related to PACs and PVCs improved on metoprolol, history of breast cancer SP radiation therapy to the chest was seen by [place] >1-year ago for palpitations and hypertension presents here" coming back for evaluation and management of leg edema and dyspnea.  I had seen her with above complaints about 6 weeks ago.  I started her on simvastatin which she is tolerating 20 mg every other day for now, but has made significant lifestyle changes and has started to exercise every day at the [facility].  She has noticed complete resolution of her leg edema since making diet changes and dyspnea has significantly improved as well.  I reviewed the results of the stress test and echocardiogram with the patient and reassured her.  I suspect her dyspnea was related to deconditioning and also elevated blood pressure which is also improved now since making lifestyle changes and reducing her salt intake and also has noticed resolution of leg edema.  Primary prevention discussed with the patient, in view of abdominal aortic atherosclerosis, although lipids have improved since she has made lifestyle changes and LDL is <100, I have encouraged her to continue taking statins on a  daily basis instead of every other day as she tolerates.  Patient wants to recheck her lipids in 4 to 6 weeks.  Placed orders.  She can follow-up with her PCP for further management from cardiac standpoint.  Positive reinforcement given.  I also reviewed the chest x-ray, does not reveal any infiltrates or fibrosis.  Patient was pleased with the evaluation.   Adrian Prows, MD, [facility] 01/19/2022, 10:31 AM Office: [phone] Fax: [phone] Pager: [phone]

## 2022-01-20 DIAGNOSIS — M1711 Unilateral primary osteoarthritis, right knee: Secondary | ICD-10-CM | POA: Diagnosis not present

## 2022-01-23 DIAGNOSIS — D49512 Neoplasm of unspecified behavior of left kidney: Secondary | ICD-10-CM | POA: Diagnosis not present

## 2022-01-23 DIAGNOSIS — N281 Cyst of kidney, acquired: Secondary | ICD-10-CM | POA: Diagnosis not present

## 2022-01-26 ENCOUNTER — Other Ambulatory Visit: Payer: Self-pay | Admitting: Cardiology

## 2022-01-26 DIAGNOSIS — I10 Essential (primary) hypertension: Secondary | ICD-10-CM

## 2022-01-26 DIAGNOSIS — R002 Palpitations: Secondary | ICD-10-CM

## 2022-02-02 DIAGNOSIS — R7309 Other abnormal glucose: Secondary | ICD-10-CM | POA: Diagnosis not present

## 2022-02-02 DIAGNOSIS — D3131 Benign neoplasm of right choroid: Secondary | ICD-10-CM | POA: Diagnosis not present

## 2022-02-02 DIAGNOSIS — Z961 Presence of intraocular lens: Secondary | ICD-10-CM | POA: Diagnosis not present

## 2022-02-02 DIAGNOSIS — H04123 Dry eye syndrome of bilateral lacrimal glands: Secondary | ICD-10-CM | POA: Diagnosis not present

## 2022-02-13 DIAGNOSIS — Z1231 Encounter for screening mammogram for malignant neoplasm of breast: Secondary | ICD-10-CM | POA: Diagnosis not present

## 2022-02-24 DIAGNOSIS — M8589 Other specified disorders of bone density and structure, multiple sites: Secondary | ICD-10-CM | POA: Diagnosis not present

## 2022-03-05 ENCOUNTER — Other Ambulatory Visit: Payer: Self-pay | Admitting: Cardiology

## 2022-03-05 DIAGNOSIS — E7800 Pure hypercholesterolemia, unspecified: Secondary | ICD-10-CM

## 2022-03-05 DIAGNOSIS — I700 Atherosclerosis of aorta: Secondary | ICD-10-CM

## 2022-03-24 DIAGNOSIS — M5451 Vertebrogenic low back pain: Secondary | ICD-10-CM | POA: Diagnosis not present

## 2022-04-07 ENCOUNTER — Encounter: Payer: Self-pay | Admitting: Oncology

## 2022-04-10 DIAGNOSIS — M25551 Pain in right hip: Secondary | ICD-10-CM | POA: Diagnosis not present

## 2022-04-28 DIAGNOSIS — M25551 Pain in right hip: Secondary | ICD-10-CM | POA: Diagnosis not present

## 2022-05-03 DIAGNOSIS — M25551 Pain in right hip: Secondary | ICD-10-CM | POA: Diagnosis not present

## 2022-05-04 DIAGNOSIS — J3081 Allergic rhinitis due to animal (cat) (dog) hair and dander: Secondary | ICD-10-CM | POA: Diagnosis not present

## 2022-05-04 DIAGNOSIS — J3089 Other allergic rhinitis: Secondary | ICD-10-CM | POA: Diagnosis not present

## 2022-05-04 DIAGNOSIS — J301 Allergic rhinitis due to pollen: Secondary | ICD-10-CM | POA: Diagnosis not present

## 2022-05-04 DIAGNOSIS — J4530 Mild persistent asthma, uncomplicated: Secondary | ICD-10-CM | POA: Diagnosis not present

## 2022-05-05 DIAGNOSIS — Z86010 Personal history of colonic polyps: Secondary | ICD-10-CM | POA: Diagnosis not present

## 2022-05-05 DIAGNOSIS — K219 Gastro-esophageal reflux disease without esophagitis: Secondary | ICD-10-CM | POA: Diagnosis not present

## 2022-05-08 DIAGNOSIS — M25551 Pain in right hip: Secondary | ICD-10-CM | POA: Diagnosis not present

## 2022-05-09 DIAGNOSIS — K449 Diaphragmatic hernia without obstruction or gangrene: Secondary | ICD-10-CM | POA: Diagnosis not present

## 2022-05-09 DIAGNOSIS — K319 Disease of stomach and duodenum, unspecified: Secondary | ICD-10-CM | POA: Diagnosis not present

## 2022-05-09 DIAGNOSIS — K219 Gastro-esophageal reflux disease without esophagitis: Secondary | ICD-10-CM | POA: Diagnosis not present

## 2022-05-09 DIAGNOSIS — K2930 Chronic superficial gastritis without bleeding: Secondary | ICD-10-CM | POA: Diagnosis not present

## 2022-05-16 DIAGNOSIS — K2930 Chronic superficial gastritis without bleeding: Secondary | ICD-10-CM | POA: Diagnosis not present

## 2022-05-16 DIAGNOSIS — K319 Disease of stomach and duodenum, unspecified: Secondary | ICD-10-CM | POA: Diagnosis not present

## 2022-05-17 ENCOUNTER — Ambulatory Visit: Payer: Medicare PPO | Admitting: Podiatry

## 2022-05-17 ENCOUNTER — Encounter: Payer: Self-pay | Admitting: Podiatry

## 2022-05-17 DIAGNOSIS — L600 Ingrowing nail: Secondary | ICD-10-CM | POA: Diagnosis not present

## 2022-05-17 DIAGNOSIS — M2041 Other hammer toe(s) (acquired), right foot: Secondary | ICD-10-CM | POA: Diagnosis not present

## 2022-05-17 DIAGNOSIS — M2042 Other hammer toe(s) (acquired), left foot: Secondary | ICD-10-CM

## 2022-05-17 NOTE — Progress Notes (Signed)
Subjective:   Patient ID: Sarah Underwood, female   DOB: 77 y.o.   MRN: [id number]   HPI Patient presents with several problems with 1 being structural digital deformities of both feet and secondarily a painful ingrown toenail right second toe medial border that is hard for her to trim and she is tried to trim and soak it without relief of symptoms and it has been going on for a while.  Patient does not smoke likes to be active   Review of Systems  All other systems reviewed and are negative.       Objective:  Physical Exam Vitals and nursing note reviewed.  Constitutional:      Appearance: She is well-developed.  Pulmonary:     Effort: Pulmonary effort is normal.  Musculoskeletal:        General: Normal range of motion.  Skin:    General: Skin is warm.  Neurological:     Mental Status: She is alert.     Neurovascular status found to be intact muscle strength was found to be adequate range of motion adequate with incurvated medial border right second nail that is painful when pressed with digital deformities overlapping the big toe slightly with medial rotation of the toes.  Patient is found to have good digital perfusion well-oriented x 3 ingrown toe     Assessment:  Ingrown toenail deformity right second toe with pain no drainage noted and mild to moderate structural hammertoe deformity     Plan:  H&P conditions reviewed.  I recommended that the nail be corrected due to the chronic nature of condition and pain and hammertoes be watched with wide shoe gear and soft mesh materials.  Explained procedure she read and signed consent form understanding risk and I infiltrated the right second toe 60 mg Xylocaine Marcaine mixture and use sterile prep removed the medial border with sterile instrumentation exposed matrix applied phenol 3 applications 30 seconds followed by alcohol lavage sterile dressing gave instructions on soaks leave dressings on 24 hours take them off earlier if  throbbing were to occur and encouraged her to call with questions concerns which Underwood arise

## 2022-05-17 NOTE — Patient Instructions (Signed)

## 2022-05-22 DIAGNOSIS — M25551 Pain in right hip: Secondary | ICD-10-CM | POA: Diagnosis not present

## 2022-05-30 DIAGNOSIS — K2980 Duodenitis without bleeding: Secondary | ICD-10-CM | POA: Diagnosis not present

## 2022-06-01 DIAGNOSIS — M25551 Pain in right hip: Secondary | ICD-10-CM | POA: Diagnosis not present

## 2022-06-08 DIAGNOSIS — M25551 Pain in right hip: Secondary | ICD-10-CM | POA: Diagnosis not present

## 2022-06-15 DIAGNOSIS — M25551 Pain in right hip: Secondary | ICD-10-CM | POA: Diagnosis not present

## 2022-07-03 DIAGNOSIS — M25551 Pain in right hip: Secondary | ICD-10-CM | POA: Diagnosis not present

## 2022-07-10 DIAGNOSIS — M25551 Pain in right hip: Secondary | ICD-10-CM | POA: Diagnosis not present

## 2022-07-17 DIAGNOSIS — D2262 Melanocytic nevi of left upper limb, including shoulder: Secondary | ICD-10-CM | POA: Diagnosis not present

## 2022-07-17 DIAGNOSIS — L570 Actinic keratosis: Secondary | ICD-10-CM | POA: Diagnosis not present

## 2022-07-17 DIAGNOSIS — L821 Other seborrheic keratosis: Secondary | ICD-10-CM | POA: Diagnosis not present

## 2022-07-17 DIAGNOSIS — D225 Melanocytic nevi of trunk: Secondary | ICD-10-CM | POA: Diagnosis not present

## 2022-07-17 DIAGNOSIS — D485 Neoplasm of uncertain behavior of skin: Secondary | ICD-10-CM | POA: Diagnosis not present

## 2022-07-17 DIAGNOSIS — D1801 Hemangioma of skin and subcutaneous tissue: Secondary | ICD-10-CM | POA: Diagnosis not present

## 2022-07-19 DIAGNOSIS — M25551 Pain in right hip: Secondary | ICD-10-CM | POA: Diagnosis not present

## 2022-07-20 DIAGNOSIS — M25561 Pain in right knee: Secondary | ICD-10-CM | POA: Diagnosis not present

## 2022-07-20 DIAGNOSIS — M170 Bilateral primary osteoarthritis of knee: Secondary | ICD-10-CM | POA: Diagnosis not present

## 2022-07-20 DIAGNOSIS — M25562 Pain in left knee: Secondary | ICD-10-CM | POA: Diagnosis not present

## 2022-07-28 DIAGNOSIS — M25551 Pain in right hip: Secondary | ICD-10-CM | POA: Diagnosis not present

## 2022-07-31 DIAGNOSIS — M170 Bilateral primary osteoarthritis of knee: Secondary | ICD-10-CM | POA: Diagnosis not present

## 2022-08-04 DIAGNOSIS — M25551 Pain in right hip: Secondary | ICD-10-CM | POA: Diagnosis not present

## 2022-08-07 DIAGNOSIS — L570 Actinic keratosis: Secondary | ICD-10-CM | POA: Diagnosis not present

## 2022-08-07 DIAGNOSIS — M170 Bilateral primary osteoarthritis of knee: Secondary | ICD-10-CM | POA: Diagnosis not present

## 2022-08-09 DIAGNOSIS — M25551 Pain in right hip: Secondary | ICD-10-CM | POA: Diagnosis not present

## 2022-08-18 ENCOUNTER — Inpatient Hospital Stay: Payer: Medicare PPO | Attending: Oncology | Admitting: Oncology

## 2022-08-18 VITALS — BP 140/67 | HR 71 | Temp 98.1°F | Resp 18 | Ht 62.0 in | Wt 153.6 lb

## 2022-08-18 DIAGNOSIS — M8580 Other specified disorders of bone density and structure, unspecified site: Secondary | ICD-10-CM | POA: Insufficient documentation

## 2022-08-18 DIAGNOSIS — C50812 Malignant neoplasm of overlapping sites of left female breast: Secondary | ICD-10-CM | POA: Insufficient documentation

## 2022-08-18 DIAGNOSIS — C50511 Malignant neoplasm of lower-outer quadrant of right female breast: Secondary | ICD-10-CM

## 2022-08-18 DIAGNOSIS — Z170 Estrogen receptor positive status [ER+]: Secondary | ICD-10-CM

## 2022-08-18 DIAGNOSIS — M170 Bilateral primary osteoarthritis of knee: Secondary | ICD-10-CM | POA: Diagnosis not present

## 2022-08-18 NOTE — Progress Notes (Signed)
  [facility] OFFICE PROGRESS NOTE   Diagnosis: Breast cancer  INTERVAL HISTORY:   [name] returns as scheduled.  She continues anastrozole.  No palpable change at the left chest wall or right breast.  She underwent a negative right mammogram 02/13/2022.  A bone density scan 02/24/2022 revealed osteopenia with a significant decrease in bone density of the bilateral forearms.  She reports she was recently diagnosed with celiac disease after undergoing an upper endoscopy by Dr. Dulce Sellar.  Objective:  Vital signs in last 24 hours:  Blood pressure (!) 140/67, pulse 71, temperature 98.1 F (36.7 C), resp. rate 18, height 5\' 2"  (1.575 m), weight 153 lb 9.6 oz (69.7 kg), last menstrual period 04/25/2003, SpO2 100 %.    Lymphatics: No cervical, supraclavicular, or axillary nodes Resp: Lungs clear bilaterally Cardio: Regular rate and rhythm GI: No hepatosplenomegaly Vascular: No leg edema Breast: Right breast without mass, status post left mastectomy.  No evidence for chest wall tumor recurrence.  Radiation telangiectasias over the left chest wall firm nodular area superior to the lateral aspect of the mastectomy scar   Lab Results:  Lab Results  Component Value Date   WBC 7.3 04/09/2020   HGB 13.8 04/09/2020   HCT 42.6 04/09/2020   MCV 87.5 04/09/2020   PLT 316 04/09/2020    CMP  Lab Results  Component Value Date   NA 138 04/09/2020   K 4.4 04/09/2020   CL 102 04/09/2020   CO2 28 04/09/2020   GLUCOSE 97 04/09/2020   BUN 14 04/09/2020   CREATININE 0.79 04/09/2020   CALCIUM 9.3 04/09/2020   PROT 7.3 04/09/2020   ALBUMIN 4.0 04/09/2020   AST 19 04/09/2020   ALT 17 04/09/2020   ALKPHOS 72 04/09/2020   BILITOT 0.7 04/09/2020   GFRNONAA >60 04/09/2020   GFRAA >60 09/10/2015      Medications: I have reviewed the patient's current medications.   Assessment/Plan: Sarah Underwood was diagnosed with left-sided breast cancer 1995.  She developed a chest wall  recurrence in October 2001.  She will continue indefinite Arimidex. Ms Underwood is in clinical remission.   She will return for an office visit in 1 year.  She will be scheduled for a right mammogram in October 2024.  Sarah Underwood was found to have osteopenia on a bone density scan in November 2023.  She plans to follow-up with Dr. Link Snuffer for management of the osteopenia.     Thornton Papas, MD  08/18/2022  10:37 AM

## 2022-08-21 DIAGNOSIS — M25551 Pain in right hip: Secondary | ICD-10-CM | POA: Diagnosis not present

## 2022-08-25 DIAGNOSIS — M25551 Pain in right hip: Secondary | ICD-10-CM | POA: Diagnosis not present

## 2022-08-30 DIAGNOSIS — M25551 Pain in right hip: Secondary | ICD-10-CM | POA: Diagnosis not present

## 2022-09-29 DIAGNOSIS — M170 Bilateral primary osteoarthritis of knee: Secondary | ICD-10-CM | POA: Diagnosis not present

## 2022-10-03 DIAGNOSIS — C50912 Malignant neoplasm of unspecified site of left female breast: Secondary | ICD-10-CM | POA: Diagnosis not present

## 2022-10-03 DIAGNOSIS — M8580 Other specified disorders of bone density and structure, unspecified site: Secondary | ICD-10-CM | POA: Diagnosis not present

## 2022-10-03 DIAGNOSIS — R0989 Other specified symptoms and signs involving the circulatory and respiratory systems: Secondary | ICD-10-CM | POA: Diagnosis not present

## 2022-10-03 DIAGNOSIS — K219 Gastro-esophageal reflux disease without esophagitis: Secondary | ICD-10-CM | POA: Diagnosis not present

## 2022-10-03 DIAGNOSIS — K900 Celiac disease: Secondary | ICD-10-CM | POA: Diagnosis not present

## 2022-11-16 DIAGNOSIS — L578 Other skin changes due to chronic exposure to nonionizing radiation: Secondary | ICD-10-CM | POA: Diagnosis not present

## 2022-11-16 DIAGNOSIS — L853 Xerosis cutis: Secondary | ICD-10-CM | POA: Diagnosis not present

## 2022-11-23 ENCOUNTER — Other Ambulatory Visit: Payer: Self-pay | Admitting: Oncology

## 2022-11-23 DIAGNOSIS — C50511 Malignant neoplasm of lower-outer quadrant of right female breast: Secondary | ICD-10-CM

## 2022-12-04 DIAGNOSIS — E559 Vitamin D deficiency, unspecified: Secondary | ICD-10-CM | POA: Diagnosis not present

## 2022-12-04 DIAGNOSIS — E0500 Thyrotoxicosis with diffuse goiter without thyrotoxic crisis or storm: Secondary | ICD-10-CM | POA: Diagnosis not present

## 2022-12-04 DIAGNOSIS — E7800 Pure hypercholesterolemia, unspecified: Secondary | ICD-10-CM | POA: Diagnosis not present

## 2022-12-04 DIAGNOSIS — E1129 Type 2 diabetes mellitus with other diabetic kidney complication: Secondary | ICD-10-CM | POA: Diagnosis not present

## 2022-12-04 DIAGNOSIS — I10 Essential (primary) hypertension: Secondary | ICD-10-CM | POA: Diagnosis not present

## 2022-12-11 DIAGNOSIS — M810 Age-related osteoporosis without current pathological fracture: Secondary | ICD-10-CM | POA: Diagnosis not present

## 2022-12-11 DIAGNOSIS — Z23 Encounter for immunization: Secondary | ICD-10-CM | POA: Diagnosis not present

## 2022-12-11 DIAGNOSIS — Z0000 Encounter for general adult medical examination without abnormal findings: Secondary | ICD-10-CM | POA: Diagnosis not present

## 2022-12-11 DIAGNOSIS — K219 Gastro-esophageal reflux disease without esophagitis: Secondary | ICD-10-CM | POA: Diagnosis not present

## 2022-12-11 DIAGNOSIS — Z1331 Encounter for screening for depression: Secondary | ICD-10-CM | POA: Diagnosis not present

## 2022-12-11 DIAGNOSIS — K900 Celiac disease: Secondary | ICD-10-CM | POA: Diagnosis not present

## 2022-12-11 DIAGNOSIS — E1129 Type 2 diabetes mellitus with other diabetic kidney complication: Secondary | ICD-10-CM | POA: Diagnosis not present

## 2022-12-11 DIAGNOSIS — I10 Essential (primary) hypertension: Secondary | ICD-10-CM | POA: Diagnosis not present

## 2022-12-11 DIAGNOSIS — C50912 Malignant neoplasm of unspecified site of left female breast: Secondary | ICD-10-CM | POA: Diagnosis not present

## 2022-12-11 DIAGNOSIS — G319 Degenerative disease of nervous system, unspecified: Secondary | ICD-10-CM | POA: Diagnosis not present

## 2022-12-11 DIAGNOSIS — E1165 Type 2 diabetes mellitus with hyperglycemia: Secondary | ICD-10-CM | POA: Diagnosis not present

## 2022-12-11 DIAGNOSIS — Z1339 Encounter for screening examination for other mental health and behavioral disorders: Secondary | ICD-10-CM | POA: Diagnosis not present

## 2023-01-03 DIAGNOSIS — J4530 Mild persistent asthma, uncomplicated: Secondary | ICD-10-CM | POA: Diagnosis not present

## 2023-01-03 DIAGNOSIS — J301 Allergic rhinitis due to pollen: Secondary | ICD-10-CM | POA: Diagnosis not present

## 2023-01-03 DIAGNOSIS — J3089 Other allergic rhinitis: Secondary | ICD-10-CM | POA: Diagnosis not present

## 2023-01-03 DIAGNOSIS — J3081 Allergic rhinitis due to animal (cat) (dog) hair and dander: Secondary | ICD-10-CM | POA: Diagnosis not present

## 2023-01-16 DIAGNOSIS — K219 Gastro-esophageal reflux disease without esophagitis: Secondary | ICD-10-CM | POA: Diagnosis not present

## 2023-01-16 DIAGNOSIS — K900 Celiac disease: Secondary | ICD-10-CM | POA: Diagnosis not present

## 2023-01-16 DIAGNOSIS — Z86010 Personal history of colonic polyps: Secondary | ICD-10-CM | POA: Diagnosis not present

## 2023-01-17 ENCOUNTER — Other Ambulatory Visit: Payer: Self-pay | Admitting: Cardiology

## 2023-01-17 DIAGNOSIS — I10 Essential (primary) hypertension: Secondary | ICD-10-CM

## 2023-01-17 DIAGNOSIS — R002 Palpitations: Secondary | ICD-10-CM

## 2023-01-19 DIAGNOSIS — M170 Bilateral primary osteoarthritis of knee: Secondary | ICD-10-CM | POA: Diagnosis not present

## 2023-01-19 DIAGNOSIS — M1712 Unilateral primary osteoarthritis, left knee: Secondary | ICD-10-CM | POA: Diagnosis not present

## 2023-01-19 DIAGNOSIS — M1711 Unilateral primary osteoarthritis, right knee: Secondary | ICD-10-CM | POA: Diagnosis not present

## 2023-02-02 DIAGNOSIS — D4102 Neoplasm of uncertain behavior of left kidney: Secondary | ICD-10-CM | POA: Diagnosis not present

## 2023-02-02 DIAGNOSIS — N281 Cyst of kidney, acquired: Secondary | ICD-10-CM | POA: Diagnosis not present

## 2023-02-09 ENCOUNTER — Other Ambulatory Visit: Payer: Self-pay | Admitting: Cardiology

## 2023-02-09 DIAGNOSIS — I10 Essential (primary) hypertension: Secondary | ICD-10-CM

## 2023-02-09 DIAGNOSIS — R002 Palpitations: Secondary | ICD-10-CM

## 2023-02-15 DIAGNOSIS — D3131 Benign neoplasm of right choroid: Secondary | ICD-10-CM | POA: Diagnosis not present

## 2023-02-15 DIAGNOSIS — Z961 Presence of intraocular lens: Secondary | ICD-10-CM | POA: Diagnosis not present

## 2023-02-15 DIAGNOSIS — H04123 Dry eye syndrome of bilateral lacrimal glands: Secondary | ICD-10-CM | POA: Diagnosis not present

## 2023-02-15 DIAGNOSIS — H5052 Exophoria: Secondary | ICD-10-CM | POA: Diagnosis not present

## 2023-02-15 DIAGNOSIS — R7309 Other abnormal glucose: Secondary | ICD-10-CM | POA: Diagnosis not present

## 2023-02-16 DIAGNOSIS — E1129 Type 2 diabetes mellitus with other diabetic kidney complication: Secondary | ICD-10-CM | POA: Diagnosis not present

## 2023-02-16 DIAGNOSIS — K900 Celiac disease: Secondary | ICD-10-CM | POA: Diagnosis not present

## 2023-02-16 DIAGNOSIS — G629 Polyneuropathy, unspecified: Secondary | ICD-10-CM | POA: Diagnosis not present

## 2023-02-19 DIAGNOSIS — K900 Celiac disease: Secondary | ICD-10-CM | POA: Diagnosis not present

## 2023-02-19 DIAGNOSIS — Z713 Dietary counseling and surveillance: Secondary | ICD-10-CM | POA: Diagnosis not present

## 2023-02-22 ENCOUNTER — Encounter (HOSPITAL_COMMUNITY): Payer: Self-pay

## 2023-02-22 ENCOUNTER — Emergency Department (HOSPITAL_COMMUNITY): Payer: Medicare PPO

## 2023-02-22 ENCOUNTER — Inpatient Hospital Stay (HOSPITAL_COMMUNITY)
Admission: EM | Admit: 2023-02-22 | Discharge: 2023-02-27 | DRG: 483 | Disposition: A | Discharge status: Home / Self Care | Payer: Medicare PPO | Attending: Internal Medicine | Admitting: Internal Medicine

## 2023-02-22 ENCOUNTER — Other Ambulatory Visit: Payer: Self-pay

## 2023-02-22 DIAGNOSIS — Z888 Allergy status to other drugs, medicaments and biological substances status: Secondary | ICD-10-CM | POA: Diagnosis not present

## 2023-02-22 DIAGNOSIS — Z79811 Long term (current) use of aromatase inhibitors: Secondary | ICD-10-CM | POA: Diagnosis not present

## 2023-02-22 DIAGNOSIS — W19XXXA Unspecified fall, initial encounter: Secondary | ICD-10-CM

## 2023-02-22 DIAGNOSIS — K900 Celiac disease: Secondary | ICD-10-CM | POA: Diagnosis present

## 2023-02-22 DIAGNOSIS — J4530 Mild persistent asthma, uncomplicated: Secondary | ICD-10-CM | POA: Diagnosis present

## 2023-02-22 DIAGNOSIS — D72829 Elevated white blood cell count, unspecified: Secondary | ICD-10-CM | POA: Diagnosis present

## 2023-02-22 DIAGNOSIS — I10 Essential (primary) hypertension: Secondary | ICD-10-CM | POA: Diagnosis present

## 2023-02-22 DIAGNOSIS — Z96641 Presence of right artificial hip joint: Secondary | ICD-10-CM | POA: Diagnosis present

## 2023-02-22 DIAGNOSIS — Z8249 Family history of ischemic heart disease and other diseases of the circulatory system: Secondary | ICD-10-CM

## 2023-02-22 DIAGNOSIS — F413 Other mixed anxiety disorders: Secondary | ICD-10-CM | POA: Diagnosis not present

## 2023-02-22 DIAGNOSIS — Z91048 Other nonmedicinal substance allergy status: Secondary | ICD-10-CM | POA: Diagnosis not present

## 2023-02-22 DIAGNOSIS — S4292XA Fracture of left shoulder girdle, part unspecified, initial encounter for closed fracture: Secondary | ICD-10-CM | POA: Diagnosis not present

## 2023-02-22 DIAGNOSIS — S42201A Unspecified fracture of upper end of right humerus, initial encounter for closed fracture: Secondary | ICD-10-CM | POA: Diagnosis not present

## 2023-02-22 DIAGNOSIS — E041 Nontoxic single thyroid nodule: Secondary | ICD-10-CM | POA: Diagnosis present

## 2023-02-22 DIAGNOSIS — Z803 Family history of malignant neoplasm of breast: Secondary | ICD-10-CM

## 2023-02-22 DIAGNOSIS — Z800 Family history of malignant neoplasm of digestive organs: Secondary | ICD-10-CM

## 2023-02-22 DIAGNOSIS — S42352A Displaced comminuted fracture of shaft of humerus, left arm, initial encounter for closed fracture: Secondary | ICD-10-CM | POA: Diagnosis not present

## 2023-02-22 DIAGNOSIS — Z7951 Long term (current) use of inhaled steroids: Secondary | ICD-10-CM | POA: Diagnosis not present

## 2023-02-22 DIAGNOSIS — Z96612 Presence of left artificial shoulder joint: Secondary | ICD-10-CM | POA: Diagnosis not present

## 2023-02-22 DIAGNOSIS — Z801 Family history of malignant neoplasm of trachea, bronchus and lung: Secondary | ICD-10-CM

## 2023-02-22 DIAGNOSIS — M19032 Primary osteoarthritis, left wrist: Secondary | ICD-10-CM | POA: Diagnosis not present

## 2023-02-22 DIAGNOSIS — M542 Cervicalgia: Secondary | ICD-10-CM | POA: Diagnosis not present

## 2023-02-22 DIAGNOSIS — Z8262 Family history of osteoporosis: Secondary | ICD-10-CM | POA: Diagnosis not present

## 2023-02-22 DIAGNOSIS — S42232A 3-part fracture of surgical neck of left humerus, initial encounter for closed fracture: Secondary | ICD-10-CM | POA: Diagnosis present

## 2023-02-22 DIAGNOSIS — S42252A Displaced fracture of greater tuberosity of left humerus, initial encounter for closed fracture: Secondary | ICD-10-CM | POA: Diagnosis not present

## 2023-02-22 DIAGNOSIS — Z853 Personal history of malignant neoplasm of breast: Secondary | ICD-10-CM | POA: Diagnosis not present

## 2023-02-22 DIAGNOSIS — Z9012 Acquired absence of left breast and nipple: Secondary | ICD-10-CM

## 2023-02-22 DIAGNOSIS — Z882 Allergy status to sulfonamides status: Secondary | ICD-10-CM

## 2023-02-22 DIAGNOSIS — S42202A Unspecified fracture of upper end of left humerus, initial encounter for closed fracture: Secondary | ICD-10-CM | POA: Diagnosis not present

## 2023-02-22 DIAGNOSIS — Z85828 Personal history of other malignant neoplasm of skin: Secondary | ICD-10-CM | POA: Diagnosis not present

## 2023-02-22 DIAGNOSIS — H539 Unspecified visual disturbance: Secondary | ICD-10-CM

## 2023-02-22 DIAGNOSIS — K219 Gastro-esophageal reflux disease without esophagitis: Secondary | ICD-10-CM | POA: Diagnosis present

## 2023-02-22 DIAGNOSIS — E871 Hypo-osmolality and hyponatremia: Secondary | ICD-10-CM | POA: Diagnosis present

## 2023-02-22 DIAGNOSIS — S42292A Other displaced fracture of upper end of left humerus, initial encounter for closed fracture: Secondary | ICD-10-CM | POA: Diagnosis not present

## 2023-02-22 DIAGNOSIS — W010XXA Fall on same level from slipping, tripping and stumbling without subsequent striking against object, initial encounter: Secondary | ICD-10-CM | POA: Diagnosis present

## 2023-02-22 DIAGNOSIS — S4290XA Fracture of unspecified shoulder girdle, part unspecified, initial encounter for closed fracture: Secondary | ICD-10-CM | POA: Diagnosis present

## 2023-02-22 DIAGNOSIS — S0990XA Unspecified injury of head, initial encounter: Secondary | ICD-10-CM | POA: Diagnosis not present

## 2023-02-22 DIAGNOSIS — Z880 Allergy status to penicillin: Secondary | ICD-10-CM | POA: Diagnosis not present

## 2023-02-22 DIAGNOSIS — Z881 Allergy status to other antibiotic agents status: Secondary | ICD-10-CM

## 2023-02-22 DIAGNOSIS — M8580 Other specified disorders of bone density and structure, unspecified site: Secondary | ICD-10-CM | POA: Diagnosis present

## 2023-02-22 DIAGNOSIS — Z471 Aftercare following joint replacement surgery: Secondary | ICD-10-CM | POA: Diagnosis not present

## 2023-02-22 DIAGNOSIS — J45909 Unspecified asthma, uncomplicated: Secondary | ICD-10-CM | POA: Diagnosis not present

## 2023-02-22 DIAGNOSIS — Z923 Personal history of irradiation: Secondary | ICD-10-CM | POA: Diagnosis not present

## 2023-02-22 DIAGNOSIS — H547 Unspecified visual loss: Secondary | ICD-10-CM | POA: Diagnosis not present

## 2023-02-22 DIAGNOSIS — G8918 Other acute postprocedural pain: Secondary | ICD-10-CM | POA: Diagnosis not present

## 2023-02-22 DIAGNOSIS — Z79899 Other long term (current) drug therapy: Secondary | ICD-10-CM | POA: Diagnosis not present

## 2023-02-22 DIAGNOSIS — S42212A Unspecified displaced fracture of surgical neck of left humerus, initial encounter for closed fracture: Secondary | ICD-10-CM | POA: Diagnosis not present

## 2023-02-22 DIAGNOSIS — Z820 Family history of epilepsy and other diseases of the nervous system: Secondary | ICD-10-CM

## 2023-02-22 DIAGNOSIS — Z818 Family history of other mental and behavioral disorders: Secondary | ICD-10-CM

## 2023-02-22 DIAGNOSIS — M79642 Pain in left hand: Secondary | ICD-10-CM | POA: Diagnosis not present

## 2023-02-22 DIAGNOSIS — Y92009 Unspecified place in unspecified non-institutional (private) residence as the place of occurrence of the external cause: Secondary | ICD-10-CM

## 2023-02-22 DIAGNOSIS — Z833 Family history of diabetes mellitus: Secondary | ICD-10-CM

## 2023-02-22 DIAGNOSIS — E042 Nontoxic multinodular goiter: Secondary | ICD-10-CM | POA: Diagnosis not present

## 2023-02-22 HISTORY — DX: Celiac disease: K90.0

## 2023-02-22 LAB — CBC
HCT: 39.8 % (ref 36.0–46.0)
Hemoglobin: 13.1 g/dL (ref 12.0–15.0)
MCH: 29.4 pg (ref 26.0–34.0)
MCHC: 32.9 g/dL (ref 30.0–36.0)
MCV: 89.2 fL (ref 80.0–100.0)
Platelets: 354 10*3/uL (ref 150–400)
RBC: 4.46 MIL/uL (ref 3.87–5.11)
RDW: 13.2 % (ref 11.5–15.5)
WBC: 14 10*3/uL — ABNORMAL HIGH (ref 4.0–10.5)
nRBC: 0 % (ref 0.0–0.2)

## 2023-02-22 LAB — BASIC METABOLIC PANEL
Anion gap: 11 (ref 5–15)
BUN: 14 mg/dL (ref 8–23)
CO2: 23 mmol/L (ref 22–32)
Calcium: 9.2 mg/dL (ref 8.9–10.3)
Chloride: 97 mmol/L — ABNORMAL LOW (ref 98–111)
Creatinine, Ser: 0.93 mg/dL (ref 0.44–1.00)
GFR, Estimated: >60 mL/min (ref >60)
Glucose, Bld: 140 mg/dL — ABNORMAL HIGH (ref 70–99)
Potassium: 4 mmol/L (ref 3.5–5.1)
Sodium: 131 mmol/L — ABNORMAL LOW (ref 135–145)

## 2023-02-22 LAB — I-STAT CHEM 8, ED
BUN: 15 mg/dL (ref 8–23)
Calcium, Ion: 1.21 mmol/L (ref 1.15–1.40)
Chloride: 96 mmol/L — ABNORMAL LOW (ref 98–111)
Creatinine, Ser: 0.8 mg/dL (ref 0.44–1.00)
Glucose, Bld: 125 mg/dL — ABNORMAL HIGH (ref 70–99)
HCT: 39 % (ref 36.0–46.0)
Hemoglobin: 13.3 g/dL (ref 12.0–15.0)
Potassium: 4.3 mmol/L (ref 3.5–5.1)
Sodium: 133 mmol/L — ABNORMAL LOW (ref 135–145)
TCO2: 25 mmol/L (ref 22–32)

## 2023-02-22 MED ORDER — ANASTROZOLE 1 MG PO TABS
1.0000 mg | ORAL_TABLET | Freq: Every day | ORAL | Status: DC
  Administered 2023-02-23 – 2023-02-26 (×4): 1 mg via ORAL
  Filled 2023-02-22 (×6): qty 1

## 2023-02-22 MED ORDER — METOPROLOL SUCCINATE ER 50 MG PO TB24
50.0000 mg | ORAL_TABLET | Freq: Every day | ORAL | Status: DC
  Administered 2023-02-23 – 2023-02-27 (×5): 50 mg via ORAL
  Filled 2023-02-22: qty 2
  Filled 2023-02-22: qty 1
  Filled 2023-02-22 (×3): qty 2

## 2023-02-22 MED ORDER — ACETAMINOPHEN 650 MG RE SUPP: 650.0000 mg | Freq: Four times a day (QID) | RECTAL | Status: DC | PRN

## 2023-02-22 MED ORDER — ONDANSETRON HCL 4 MG/2ML IJ SOLN: 4.0000 mg | Freq: Four times a day (QID) | INTRAMUSCULAR | Status: DC | PRN

## 2023-02-22 MED ORDER — ALBUTEROL SULFATE (2.5 MG/3ML) 0.083% IN NEBU: 3.0000 mL | INHALATION_SOLUTION | RESPIRATORY_TRACT | Status: DC | PRN

## 2023-02-22 MED ORDER — METHOCARBAMOL 1000 MG/10ML IJ SOLN: 500.0000 mg | Freq: Four times a day (QID) | INTRAMUSCULAR | Status: DC | PRN

## 2023-02-22 MED ORDER — POLYETHYLENE GLYCOL 3350 17 G PO PACK: 17.0000 g | PACK | Freq: Every day | ORAL | Status: DC | PRN

## 2023-02-22 MED ORDER — OXYCODONE HCL 5 MG PO TABS: 5.0000 mg | ORAL_TABLET | ORAL | 0 refills | Status: DC | PRN

## 2023-02-22 MED ORDER — IRBESARTAN 300 MG PO TABS
300.0000 mg | ORAL_TABLET | Freq: Every day | ORAL | Status: DC
  Administered 2023-02-23 – 2023-02-27 (×5): 300 mg via ORAL
  Filled 2023-02-22 (×5): qty 1

## 2023-02-22 MED ORDER — BISACODYL 5 MG PO TBEC: 5.0000 mg | DELAYED_RELEASE_TABLET | Freq: Every day | ORAL | Status: DC | PRN

## 2023-02-22 MED ORDER — ACETAMINOPHEN 500 MG PO TABS
1000.0000 mg | ORAL_TABLET | ORAL | Status: AC
  Administered 2023-02-22: 1000 mg via ORAL
  Filled 2023-02-22: qty 2

## 2023-02-22 MED ORDER — MOMETASONE FURO-FORMOTEROL FUM 100-5 MCG/ACT IN AERO: 2.0000 | INHALATION_SPRAY | Freq: Two times a day (BID) | RESPIRATORY_TRACT | Status: DC

## 2023-02-22 MED ORDER — HYDROMORPHONE HCL 1 MG/ML IJ SOLN
0.5000 mg | INTRAMUSCULAR | Status: DC | PRN
  Administered 2023-02-23 – 2023-02-27 (×12): 0.5 mg via INTRAVENOUS
  Filled 2023-02-22 (×12): qty 0.5

## 2023-02-22 MED ORDER — OXYCODONE HCL 5 MG PO TABS
5.0000 mg | ORAL_TABLET | ORAL | Status: DC | PRN
  Filled 2023-02-22: qty 1

## 2023-02-22 MED ORDER — ONDANSETRON HCL 4 MG PO TABS: 4.0000 mg | ORAL_TABLET | Freq: Four times a day (QID) | ORAL | Status: DC | PRN

## 2023-02-22 MED ORDER — ACETAMINOPHEN 325 MG PO TABS
650.0000 mg | ORAL_TABLET | Freq: Four times a day (QID) | ORAL | Status: DC | PRN
  Administered 2023-02-22 – 2023-02-26 (×7): 650 mg via ORAL
  Filled 2023-02-22 (×8): qty 2

## 2023-02-22 MED ORDER — IOHEXOL 350 MG/ML SOLN
75.0000 mL | Freq: Once | INTRAVENOUS | Status: AC | PRN
  Administered 2023-02-22: 75 mL via INTRAVENOUS

## 2023-02-22 MED ORDER — HYDROMORPHONE HCL 1 MG/ML IJ SOLN
0.5000 mg | Freq: Once | INTRAMUSCULAR | Status: AC
  Administered 2023-02-22: 0.5 mg via INTRAVENOUS
  Filled 2023-02-22: qty 1

## 2023-02-22 MED ORDER — ORAL CARE MOUTH RINSE: 15.0000 mL | OROMUCOSAL | Status: DC | PRN

## 2023-02-22 MED ORDER — AMLODIPINE BESYLATE 2.5 MG PO TABS
2.5000 mg | ORAL_TABLET | Freq: Every day | ORAL | Status: DC
  Administered 2023-02-23 – 2023-02-27 (×5): 2.5 mg via ORAL
  Filled 2023-02-22 (×5): qty 1

## 2023-02-22 NOTE — ED Notes (Signed)
Ortho tech notified.  

## 2023-02-22 NOTE — ED Triage Notes (Signed)
Pt states she bent down to pick up a box and lost her balance and fell sideways and hit her left shoulder today. Pt states it felt like her left arm detached from her shoulder and states she has a lump in shoulder blade area. Pt states she hit her head gently. Pt states her neck is painful also. Pt states she lost vision in her left eye briefly in the waiting room. Pt denies blood thinners. Pt has 2+ left radial pulse, cap refill less than 3 sec, warm to touch, able to move fingers. Pt states her hand feels weak.

## 2023-02-22 NOTE — ED Notes (Signed)
Pt was ambulated out of bed, had a lot of pain when getting up but able to do it independently, pt is concerned about going home due to having go up and down a flight of stairs, going up is fine but coming down she does not have anything to hold and lives alone and concerns of being on pain meds and navigating that. Provider notified.

## 2023-02-22 NOTE — H&P (Signed)
History and Physical    Sarah Underwood FAO:[id number] DOB: [birth year] DOA: 02/22/2023  PCP: Alysia Penna, MD   Patient coming from: Home   Chief Complaint: Fall, left shoulder pain  HPI: Sarah Underwood is a 77 y.o. female with medical history significant for hypertension, asthma, osteoarthritis, and breast cancer in clinical remission who presents with left shoulder pain after fall at home.   She was in her usual state when she bent over to pick something up and fell directly onto her left shoulder. She has been experiencing severe pain in the shoulder and noted a deformity.    ED Course: Upon arrival to the ED, patient is found to be afebrile and saturating well on room air with stable blood pressure.  Labs are most notable for sodium 131 and WBC 14,000.  Imaging demonstrates proximal left humerus fracture with dislocation.  Patient was treated with acetaminophen and Dilaudid in the ED.  Orthopedic surgery (Dr. Carola Frost) was consulted by the ED physician.  Review of Systems:  All other systems reviewed and apart from HPI, are negative.  Past Medical History:  Diagnosis Date   Basal cell carcinoma 1994   right side of node   Benign positional vertigo    with rolling over    Celiac disease    Dysrhythmia    Generalized anxiety disorder    GERD (gastroesophageal reflux disease)    Graves disease 2007   thyroid nodule   Hard of hearing    Heart murmur    History of breast cancer 1995   left - treated with mastectomy   History of radiation therapy 2001   37 sessions following recurrence of breast cancer in left chest wall   Hypertension    Knee pain, bilateral 12/14/2014   Major depressive disorder in remission    Mild persistent asthma, uncomplicated 06/29/2015   Nocturia    OA (osteoarthritis) of hip 09/08/2015   Ovarian mass    small solid nodule 8x28mm: Right ovary - stable since 2000, follow u/s do not show this.   Renal mass 2021   followed by Dr. Berneice Heinrich, [facility].   Having every 6 month follow up   Seasonal allergies    Stress fracture of metatarsal bone of left foot 12/15/2019   Vegetarian diet     Past Surgical History:  Procedure Laterality Date   APPENDECTOMY     basal cell removal  7/94   right nose   BREAST BIOPSY Right 1987   benign fibroadenoma   breast cancer recurrence Left 01/2000   at site of left chest wall after previous mastectomy with removal and treatment with radiaton.   CARPAL TUNNEL RELEASE Right 05/08/2013   Procedure: RIGHT CARPAL TUNNEL RELEASE;  Surgeon: Wyn Forster., MD;  Location: [facility];  Service: Orthopedics;  Laterality: Right;   CATARACT EXTRACTION     03/20/14 left eye, 12/15 right eye   COLPOSCOPY W/ BIOPSY / CURETTAGE  03/12/12   benign, but + HR HPV   EYE SURGERY     bilat cataract surgery    MASTECTOMY Left 3/95   Stage IIb 0/14 LN ER/ PR + with mastectomy - Dr. Mardella Layman   MOHS SURGERY     right nare   OVARY SURGERY  1974   lt   TONSILLECTOMY AND ADENOIDECTOMY  1950   TOTAL HIP ARTHROPLASTY Right 09/08/2015   Procedure: TOTAL HIP ARTHROPLASTY ANTERIOR APPROACH;  Surgeon: Ollen Gross, MD;  Location: [facility];  Service: Orthopedics;  Laterality: Right;    Social History:   reports that she has never smoked. She has never used smokeless tobacco. She reports current alcohol use. She reports that she does not use drugs.  Allergies  Allergen Reactions   Other Swelling    Trees   Molds & Smuts Other (See Comments)   Erythromycin Nausea And Vomiting   Penicillins Hives and Other (See Comments)    Has patient had a PCN reaction causing immediate rash, facial/tongue/throat swelling, SOB or lightheadedness with hypotension: no Has patient had a PCN reaction causing severe rash involving mucus membranes or skin necrosis: no Has patient had a PCN reaction that required hospitalization no Has patient had a PCN reaction occurring within the last 10 years: no If all of the above answers  are "NO", then may proceed with Cephalosporin use.    Terbinafine And Related     rash   Hydrochlorothiazide Rash    Pt states caused a sunburn type reaction    Sulfa Antibiotics Rash    Family History  Problem Relation Age of Onset   Breast cancer Mother 29       deceased 81   Osteoporosis Mother    Hypertension Mother    Dementia Mother        unspecified type   Mental illness Mother    Hypertension Father    Heart attack Father    Dementia Father        vascular dementia with history of "mini-strokes"   Breast cancer Sister 81       bilateral; recurrent 12 years late   Diabetes Brother    Lung cancer Maternal Grandfather        deceased 28   Cancer Paternal Grandmother        duodenal; deceased 40     Prior to Admission medications   Medication Sig Start Date End Date Taking? Authorizing Provider  oxyCODONE (ROXICODONE) 5 MG immediate release tablet Take 1 tablet (5 mg total) by mouth every 4 (four) hours as needed. 02/22/23  Yes Rondel Baton, MD  acetaminophen (TYLENOL) 650 MG CR tablet Take 1,300 mg by mouth as needed.     [provider]  albuterol (PROVENTIL HFA;VENTOLIN HFA) 108 (90 Base) MCG/ACT inhaler Inhale 2 puffs into the lungs every 4 (four) hours as needed for wheezing. 10/30/17   Kalman Shan, MD  amLODipine (NORVASC) 2.5 MG tablet Take 1 tablet by mouth daily. 07/20/18   [provider]  anastrozole (ARIMIDEX) 1 MG tablet TAKE 1 TABLET BY MOUTH EVERY DAY 11/23/22   Ladene Artist, MD  Artificial Tear Solution (TEARS NATURALE OP) Place 2 drops into both eyes daily as needed (For dry eyes.).    [provider]  Biotin 5000 MCG SUBL Take 1 tablet by mouth See admin instructions.    [provider]  budesonide-formoterol (SYMBICORT) 80-4.5 MCG/ACT inhaler Inhale 2 puffs into the lungs 2 (two) times daily.    [provider]  Cetirizine HCl (ZYRTEC ALLERGY PO) Take by mouth as needed.     [provider]  EPINEPHrine 0.3 mg/0.3 mL IJ SOAJ injection See admin instructions.    [provider]  ketotifen (ZADITOR) 0.025 % ophthalmic solution Place 1 drop into both eyes daily.    [provider]  MAGNESIUM CITRATE PO Take 1 tablet by mouth 2 (two) times daily.    [provider]  Menaquinone-7 (VITAMIN K2) 100 MCG CAPS Take by mouth.    [provider]  metoprolol succinate (TOPROL-XL) 50 MG 24 hr tablet TAKE 1 TABLET BY MOUTH EVERY DAY WITH OR IMMEDIATELY FOLLOWING A MEAL 02/09/23   Yates Decamp, MD  olmesartan (BENICAR) 40 MG tablet  11/27/19   [provider]  simvastatin (ZOCOR) 20 MG tablet Take 1 tablet (20 mg total) by mouth at bedtime. 01/19/22   Yates Decamp, MD  triamcinolone ointment (KENALOG) 0.5 % Apply 1 Application topically 2 (two) times daily. Use as needed.  Don't use for more than 5 days continuously. 01/06/22   Jerene Bears, MD  VITAMIN D PO Take 4,000 Int'l Units by mouth.    [provider]    Physical Exam: Vitals:   02/22/23 1700 02/22/23 1740 02/22/23 1805 02/22/23 1853  BP:   (!) 148/73   Pulse: 77 76 71   Resp:   18   Temp:    98.1 F (36.7 C)  TempSrc:    Oral  SpO2: 100% 95% 98%   Weight:      Height:        Constitutional: NAD, calm  Eyes: PERTLA, lids and conjunctivae normal ENMT: Mucous membranes are moist. Posterior pharynx clear of any exudate or lesions.   Neck: supple, no masses  Respiratory: no wheezing, no crackles. No accessory muscle use.  Cardiovascular: S1 & S2 heard, regular rate and rhythm. No extremity edema.   Abdomen: No distension, no tenderness, soft. Bowel sounds active.  Musculoskeletal: no clubbing / cyanosis. Left shoulder tender and deformed, neurovascularly intact.   Skin: no significant rashes, lesions, ulcers. Warm, dry, well-perfused. Neurologic: CN 2-12 grossly intact. Moving all extremities. Alert and oriented.  Psychiatric: Pleasant. Cooperative.    Labs and Imaging on  Admission: I have personally reviewed following labs and imaging studies  CBC: Recent Labs  Lab 02/22/23 1418 02/22/23 1522  WBC 14.0*  --   HGB 13.1 13.3  HCT 39.8 39.0  MCV 89.2  --   PLT 354  --    Basic Metabolic Panel: Recent Labs  Lab 02/22/23 1418 02/22/23 1522  NA 131* 133*  K 4.0 4.3  CL 97* 96*  CO2 23  --   GLUCOSE 140* 125*  BUN 14 15  CREATININE 0.93 0.80  CALCIUM 9.2  --    GFR: Estimated Creatinine Clearance: 53.8 mL/min (by C-G formula based on SCr of 0.8 mg/dL). Liver Function Tests: No results for input(s): "AST", "ALT", "ALKPHOS", "BILITOT", "PROT", "ALBUMIN" in the last 168 hours. No results for input(s): "LIPASE", "AMYLASE" in the last 168 hours. No results for input(s): "AMMONIA" in the last 168 hours. Coagulation Profile: No results for input(s): "INR", "PROTIME" in the last 168 hours. Cardiac Enzymes: No results for input(s): "CKTOTAL", "CKMB", "CKMBINDEX", "TROPONINI" in the last 168 hours. BNP (last 3 results) No results for input(s): "PROBNP" in the last 8760 hours. HbA1C: No results for input(s): "HGBA1C" in the last 72 hours. CBG: No results for input(s): "GLUCAP" in the last 168 hours. Lipid Profile: No results for input(s): "CHOL", "HDL", "LDLCALC", "TRIG", "CHOLHDL", "LDLDIRECT" in the last 72 hours. Thyroid Function Tests: No results for input(s): "TSH", "T4TOTAL", "FREET4", "T3FREE", "THYROIDAB" in the last 72 hours. Anemia Panel: No results for input(s): "VITAMINB12", "FOLATE", "FERRITIN", "TIBC", "IRON", "RETICCTPCT" in the last 72 hours. Urine analysis:    Component Value Date/Time   COLORURINE YELLOW 08/31/2015 1056   APPEARANCEUR CLEAR 08/31/2015 1056   LABSPEC 1.026 08/31/2015 1056   PHURINE 6.0 08/31/2015 1056   GLUCOSEU NEGATIVE 08/31/2015 1056  HGBUR NEGATIVE 08/31/2015 1056   BILIRUBINUR NEGATIVE 08/31/2015 1056   KETONESUR NEGATIVE 08/31/2015 1056   PROTEINUR NEGATIVE 08/31/2015 1056   NITRITE NEGATIVE  08/31/2015 1056   LEUKOCYTESUR TRACE (A) 08/31/2015 1056   Sepsis Labs: @LABRCNTIP (procalcitonin:4,lacticidven:4) )No results found for this or any previous visit (from the past 240 hour(s)).   Radiological Exams on Admission: CT Shoulder Left Wo Contrast  Result Date: 02/22/2023 CLINICAL DATA:  Left shoulder fracture. EXAM: CT OF THE UPPER LEFT EXTREMITY WITHOUT CONTRAST TECHNIQUE: Multidetector CT imaging of the upper left extremity was performed according to the standard protocol. RADIATION DOSE REDUCTION: This exam was performed according to the departmental dose-optimization program which includes automated exposure control, adjustment of the mA and/or kV according to patient size and/or use of iterative reconstruction technique. COMPARISON:  Same-day radiographs of the right shoulder at 2:38 p.m. FINDINGS: Bones/Joint/Cartilage Acute comminuted impacted fracture of the left proximal humerus with a transverse component at the level of the surgical neck with involvement of the greater and lesser tuberosities. There is approximately 2.2 cm of anterior displacement and 1 cm of lateral displacement of the distal segment. Fracture extends through the articular surface of the humeral head with a displaced fracture fragment perched along the anterior glenoid rim (series 5, image 48). The glenoid is intact. No additional fracture identified. The acromioclavicular joint is anatomically aligned. Ligaments Ligaments are suboptimally evaluated by CT. Soft tissue Intramuscular edema and hematoma surrounding the comminuted proximal humeral fracture, described above. IMPRESSION: 1. Comminuted, displaced, and impacted fracture of the left proximal humerus involving the surgical neck, greater and lesser tuberosities. 2. Posterior subluxation of the humeral head relative to the glenoid. Fracture involves the articular surface of the humeral head with a displaced fragment perched along the anterior glenoid rim. 3. No  additional fracture identified. Electronically Signed   By: Hart Robinsons M.D.   On: 02/22/2023 18:13   CT ANGIO HEAD NECK W WO CM  Result Date: 02/22/2023 CLINICAL DATA:  head strike loc; L eye vision loss transient EXAM: CT ANGIOGRAPHY HEAD AND NECK WITH AND WITHOUT CONTRAST CT CERVICAL SPINE WITHOUT CONTRAST TECHNIQUE: Multidetector CT imaging of the head and neck was performed using the standard protocol during bolus administration of intravenous contrast. Multiplanar CT image reconstructions and MIPs were obtained to evaluate the vascular anatomy. Carotid stenosis measurements (when applicable) are obtained utilizing NASCET criteria, using the distal internal carotid diameter as the denominator. Multidetector CT imaging of the cervical spine was performed without intravenous contrast. Multiplanar CT image reconstructions were also generated. RADIATION DOSE REDUCTION: This exam was performed according to the departmental dose-optimization program which includes automated exposure control, adjustment of the mA and/or kV according to patient size and/or use of iterative reconstruction technique. CONTRAST:  75mL OMNIPAQUE IOHEXOL 350 MG/ML SOLN COMPARISON:  None Available. FINDINGS: CT HEAD FINDINGS Limitations: Note that the presence of iodinated contrast material limits the ability to assess for intracranial hemorrhage. Brain: No evidence of acute infarction, hemorrhage, hydrocephalus, extra-axial collection or mass lesion/mass effect. Vascular: No hyperdense vessel or unexpected calcification. Skull: Normal. Negative for fracture or focal lesion. Sinuses/Orbits: No middle ear or mastoid effusion. Paranasal sinuses are clear. Bilateral lens replacement. Orbits are otherwise unremarkable Other: None. Review of the MIP images confirms the above findings CTA NECK FINDINGS Aortic arch: Standard branching. Imaged portion shows no evidence of aneurysm or dissection. No significant stenosis of the major arch  vessel origins. Right carotid system: No evidence of dissection, stenosis (50% or greater), or occlusion. Left  carotid system: No evidence of dissection, stenosis (50% or greater), or occlusion. Vertebral arteries: Codominant. No evidence of dissection, stenosis (50% or greater), or occlusion. Skeleton: Negative. Other neck: Heterogeneous and multinodular thyroid gland with a dominant nodule in the thyroid isthmus measuring up to 1.9 cm. Upper chest: Negative. Review of the MIP images confirms the above findings CTA HEAD FINDINGS Anterior circulation: No significant stenosis, proximal occlusion, aneurysm, or vascular malformation. Posterior circulation: No significant stenosis, proximal occlusion, aneurysm, or vascular malformation. Venous sinuses: As permitted by contrast timing, patent. Anatomic variants: None Review of the MIP images confirms the above findings CERVICAL SPINE FINDINGS Alignment: Grade 1 anterolisthesis of C2 on C3. Trace anterolisthesis of C7 on T1. Skull base and vertebrae: No acute fracture. No primary bone lesion or focal pathologic process. Soft tissues and spinal canal: No prevertebral fluid or swelling. No visible canal hematoma. Disc levels:  No CT evidence of high-grade spinal canal stenosis. Upper chest: Negative. Other: None. IMPRESSION: 1. Note that the presence of iodinated contrast markedly limits the ability to assess for intracranial hemorrhage within this limitation, no evidence of an extra-axial fluid collection. 2. No emergent large vessel occlusion. 3. No acute fracture or traumatic listhesis of the cervical spine. 4. Heterogeneous and multinodular thyroid gland with a dominant nodule in the thyroid isthmus measuring up to 1.9 cm. Recommend further evaluation with a nonemergent dedicated thyroid ultrasound if not previously performed. Electronically Signed   By: Lorenza Cambridge M.D.   On: 02/22/2023 17:18   CT C-SPINE NO CHARGE  Result Date: 02/22/2023 CLINICAL DATA:  head  strike loc; L eye vision loss transient EXAM: CT ANGIOGRAPHY HEAD AND NECK WITH AND WITHOUT CONTRAST CT CERVICAL SPINE WITHOUT CONTRAST TECHNIQUE: Multidetector CT imaging of the head and neck was performed using the standard protocol during bolus administration of intravenous contrast. Multiplanar CT image reconstructions and MIPs were obtained to evaluate the vascular anatomy. Carotid stenosis measurements (when applicable) are obtained utilizing NASCET criteria, using the distal internal carotid diameter as the denominator. Multidetector CT imaging of the cervical spine was performed without intravenous contrast. Multiplanar CT image reconstructions were also generated. RADIATION DOSE REDUCTION: This exam was performed according to the departmental dose-optimization program which includes automated exposure control, adjustment of the mA and/or kV according to patient size and/or use of iterative reconstruction technique. CONTRAST:  75mL OMNIPAQUE IOHEXOL 350 MG/ML SOLN COMPARISON:  None Available. FINDINGS: CT HEAD FINDINGS Limitations: Note that the presence of iodinated contrast material limits the ability to assess for intracranial hemorrhage. Brain: No evidence of acute infarction, hemorrhage, hydrocephalus, extra-axial collection or mass lesion/mass effect. Vascular: No hyperdense vessel or unexpected calcification. Skull: Normal. Negative for fracture or focal lesion. Sinuses/Orbits: No middle ear or mastoid effusion. Paranasal sinuses are clear. Bilateral lens replacement. Orbits are otherwise unremarkable Other: None. Review of the MIP images confirms the above findings CTA NECK FINDINGS Aortic arch: Standard branching. Imaged portion shows no evidence of aneurysm or dissection. No significant stenosis of the major arch vessel origins. Right carotid system: No evidence of dissection, stenosis (50% or greater), or occlusion. Left carotid system: No evidence of dissection, stenosis (50% or greater), or  occlusion. Vertebral arteries: Codominant. No evidence of dissection, stenosis (50% or greater), or occlusion. Skeleton: Negative. Other neck: Heterogeneous and multinodular thyroid gland with a dominant nodule in the thyroid isthmus measuring up to 1.9 cm. Upper chest: Negative. Review of the MIP images confirms the above findings CTA HEAD FINDINGS Anterior circulation: No significant stenosis, proximal occlusion,  aneurysm, or vascular malformation. Posterior circulation: No significant stenosis, proximal occlusion, aneurysm, or vascular malformation. Venous sinuses: As permitted by contrast timing, patent. Anatomic variants: None Review of the MIP images confirms the above findings CERVICAL SPINE FINDINGS Alignment: Grade 1 anterolisthesis of C2 on C3. Trace anterolisthesis of C7 on T1. Skull base and vertebrae: No acute fracture. No primary bone lesion or focal pathologic process. Soft tissues and spinal canal: No prevertebral fluid or swelling. No visible canal hematoma. Disc levels:  No CT evidence of high-grade spinal canal stenosis. Upper chest: Negative. Other: None. IMPRESSION: 1. Note that the presence of iodinated contrast markedly limits the ability to assess for intracranial hemorrhage within this limitation, no evidence of an extra-axial fluid collection. 2. No emergent large vessel occlusion. 3. No acute fracture or traumatic listhesis of the cervical spine. 4. Heterogeneous and multinodular thyroid gland with a dominant nodule in the thyroid isthmus measuring up to 1.9 cm. Recommend further evaluation with a nonemergent dedicated thyroid ultrasound if not previously performed. Electronically Signed   By: Lorenza Cambridge M.D.   On: 02/22/2023 17:18   DG Shoulder Left  Result Date: 02/22/2023 CLINICAL DATA:  Shoulder pain. EXAM: LEFT SHOULDER - 2+ VIEW COMPARISON:  None Available. FINDINGS: There is comminuted and displaced fracture of the proximal left humerus with associated dislocation of the  shoulder joint. No other acute fracture or dislocation. No aggressive osseous lesion. The acromioclavicular joint is normal in alignment and exhibit mild degenerative changes. No soft tissue swelling. No radiopaque foreign bodies. IMPRESSION: *Comminuted and displaced fracture of the proximal left humerus with shoulder dislocation. Electronically Signed   By: Jules Schick M.D.   On: 02/22/2023 16:36     Assessment/Plan   1. Proximal left humerus fracture  - Pain-control, immobilization, supportive care, follow-up on orthopedic surgery recommendations    2. Hypertension  - Continue Norvasc, Toprol, and ARB   3. Asthma  - Not in exacerbation   - Continue ICS-LABA and as-needed albuterol   4. Breast cancer  - Followed by Dr. Truett Perna and in clinical remission as of last office visit in April  - Continue Arimidex   5. Thyroid nodule  - Noted incidentally on CT in ED  - Outpatient follow-up recommended    DVT prophylaxis: SCDs Code Status: Full  Level of Care: Level of care: Med-Surg Family Communication: None present   Disposition Plan:  Patient is from: Home  Anticipated d/c is to: TBD Anticipated d/c date is: 02/24/23  Patient currently: Pending pain-control, orthopedic surgery consultation  Consults called: Orthopedic surgery  Admission status: Observation     Briscoe Deutscher, MD [facility]  02/22/2023, 7:27 PM

## 2023-02-22 NOTE — Discharge Instructions (Addendum)
You were seen for your broken arm in the emergency department.    At home, please keep the sling on until you are able to see the orthopedic surgeons.     Take Tylenol for the pain.  You may also take the oxycodone we have prescribed you for any breakthrough pain that may have.  Do not take this before driving or operating heavy machinery.  Do not take this medication with alcohol.    Follow-up with the orthopedic surgeons within the next week.   Follow-up with your primary doctor about your thyroid nodule.   Return immediately to the emergency department if you experience any of the following: Severe pain, numbness or weakness of your extremity, or any other concerning symptoms.     Thank you for visiting our Emergency Department. It was a pleasure taking care of you today.

## 2023-02-22 NOTE — ED Provider Notes (Signed)
[place] EMERGENCY DEPARTMENT AT [facility] Provider Note   CSN: [id number] Arrival date & time: 02/22/23  1248     History  Chief Complaint  Patient presents with  . Shoulder Injury    Sarah Underwood is a 77 y.o. female.  77 year old female with a history of breast cancer in remission, osteopenia, who is right-handed who presents emergency department with left shoulder pain.  Patient reports that she bent over to pick something up and lost her balance and fell directly onto her left shoulder.  Did hit her head and has been having neck pain since.  When she had was in the waiting room she was having severe pain and says that she transiently lost vision out of her left eye which returned.  She thinks this was likely due to a pain response.  No numbness or weakness of her left arm distally.  No history of orthopedic surgeries on her left upper extremity.  Not on blood thinners.  Fall occurred at 12:30 PM.  N.p.o. since 9 AM.        Home Medications Prior to Admission medications   Medication Sig Start Date End Date Taking? Authorizing Provider  acetaminophen (TYLENOL) 650 MG CR tablet Take 1,300 mg by mouth as needed.     [provider]  albuterol (PROVENTIL HFA;VENTOLIN HFA) 108 (90 Base) MCG/ACT inhaler Inhale 2 puffs into the lungs every 4 (four) hours as needed for wheezing. 10/30/17   Kalman Shan, MD  amLODipine (NORVASC) 2.5 MG tablet Take 1 tablet by mouth daily. 07/20/18   [provider]  anastrozole (ARIMIDEX) 1 MG tablet TAKE 1 TABLET BY MOUTH EVERY DAY 11/23/22   Ladene Artist, MD  Artificial Tear Solution (TEARS NATURALE OP) Place 2 drops into both eyes daily as needed (For dry eyes.).    [provider]  Biotin 5000 MCG SUBL Take 1 tablet by mouth See admin instructions.    [provider]  budesonide-formoterol (SYMBICORT) 80-4.5 MCG/ACT inhaler Inhale 2 puffs into the lungs 2 (two) times daily.    [provider]  Cetirizine HCl (ZYRTEC ALLERGY PO) Take by mouth as needed.     [provider]  EPINEPHrine 0.3 mg/0.3 mL IJ SOAJ injection See admin instructions.    [provider]  ketotifen (ZADITOR) 0.025 % ophthalmic solution Place 1 drop into both eyes daily.    [provider]  MAGNESIUM CITRATE PO Take 1 tablet by mouth 2 (two) times daily.    [provider]  Menaquinone-7 (VITAMIN K2) 100 MCG CAPS Take by mouth.    [provider]  metoprolol succinate (TOPROL-XL) 50 MG 24 hr tablet TAKE 1 TABLET BY MOUTH EVERY DAY WITH OR IMMEDIATELY FOLLOWING A MEAL 02/09/23   Yates Decamp, MD  olmesartan (BENICAR) 40 MG tablet  11/27/19   [provider]  simvastatin (ZOCOR) 20 MG tablet Take 1 tablet (20 mg total) by mouth at bedtime. 01/19/22   Yates Decamp, MD  triamcinolone ointment (KENALOG) 0.5 % Apply 1 Application topically 2 (two) times daily. Use as needed.  Don't use for more than 5 days continuously. 01/06/22   Jerene Bears, MD  VITAMIN D PO Take 4,000 Int'l Units by mouth.    [provider]      Allergies    Other, Molds & smuts, Erythromycin, Penicillins, Terbinafine and related, Hydrochlorothiazide, and Sulfa antibiotics    Review of Systems   Review of Systems  Physical Exam Updated  Vital Signs BP (!) 156/74   Pulse 62   Temp 98 F (36.7 C) (Oral)   Resp 18   Ht 5\' 2"  (1.575 m)   Wt 69.7 kg   LMP 04/25/2003   SpO2 100%   BMI 28.10 kg/m  Physical Exam Constitutional:      General: She is not in acute distress.    Appearance: Normal appearance. She is not ill-appearing.  HENT:     Head: Normocephalic and atraumatic.     Right Ear: External ear normal.     Left Ear: External ear normal.     Mouth/Throat:     Mouth: Mucous membranes are moist.     Pharynx: Oropharynx is clear.  Eyes:     Extraocular Movements: Extraocular movements intact.     Conjunctiva/sclera: Conjunctivae normal.     Pupils: Pupils  are equal, round, and reactive to light.     Comments: Gross vision intact out of both eyes.  Visual fields intact in both eyes.  Pupils 5 mm and reactive bilaterally.  Neck:     Comments: No C-spine midline tenderness to palpation Cardiovascular:     Rate and Rhythm: Normal rate and regular rhythm.     Pulses: Normal pulses.     Heart sounds: Normal heart sounds.  Pulmonary:     Effort: Pulmonary effort is normal. No respiratory distress.     Breath sounds: Normal breath sounds.  Musculoskeletal:        General: Deformity present.     Cervical back: No rigidity or tenderness.     Comments: No tenderness to palpation of midline thoracic or lumbar spine.  No step-offs palpated.  No tenderness to palpation of chest wall.  No bruising noted.  No tenderness to palpation of bilateral clavicles.  No tenderness to palpation, bruising, or deformities noted of bilateral elbows, wrists, hips, knees, or ankles.  Tenderness to palpation and deformity of left shoulder.  Intact sensation to light touch of left deltoid, biceps and triceps, forearm radial and ulnar aspects, and hand in median, radial, and ulnar distribution.  Full grip strength of left hand.  Pain limits active flexion and extension of the left elbow.  Radial pulses 2+ bilaterally.  Neurological:     General: No focal deficit present.     Mental Status: She is alert and oriented to person, place, and time.     Cranial Nerves: No cranial nerve deficit.     Sensory: No sensory deficit.     Motor: No weakness.     ED Results / Procedures / Treatments   Labs (all labs ordered are listed, but only abnormal results are displayed) Labs Reviewed  CBC - Abnormal; Notable for the following components:      Result Value   WBC 14.0 (*)    All other components within normal limits  BASIC METABOLIC PANEL  I-STAT CHEM 8, ED    EKG None  Radiology No results found.  Procedures Procedures    Medications Ordered in ED Medications   HYDROmorphone (DILAUDID) injection 0.5 mg (0.5 mg Intravenous Given 02/22/23 1514)    ED Course/ Medical Decision Making/ A&P Clinical Course as of 02/22/23 1923  Thu Feb 22, 2023  1515 Discussed shoulder fracture with Charma Igo, PA from orthopedics.  Recommends following up with Dr. Carola Frost in 1 week and placing the patient in a shoulder sling.  Does recommend CT of the shoulder at this time. [RP]  1821 Dr Carola Frost from ortho consulted after the  CT resulted. Will need operative repair but can be done as an outpatient.  [RP]  1920 Dr Antionette Char from hospitalist to admit.  [RP]    Clinical Course User Index [RP] Rondel Baton, MD                                 Medical Decision Making Amount and/or Complexity of Data Reviewed Labs: ordered. Radiology: ordered.  Risk OTC drugs. Prescription drug management. Decision regarding hospitalization.   Sarah Underwood is a 77 y.o. female with comorbidities that complicate the patient evaluation including breast cancer in remission, osteopenia, who is right-handed who presents emergency department with left shoulder pain.    Initial Ddx:  Fracture, dislocation, C-spine injury, vascular injury, stroke, vasovagal event  MDM/Course:  Patient presents emergency department with left shoulder pain after mechanical fall.  Appears to be neurovascularly intact on arrival.  Does have a deformity of the left shoulder and I suspect either fracture or dislocation.  X-ray was obtained which does show that she has humeral head fracture.  This was discussed with orthopedics who recommended CT and if no other acute findings on it to follow-up in clinic in 1 week with Dr. Carola Frost.  Patient was placed in a sling.  In terms of her vision changes, could have been a vagal event due to the pain.  With her neck pain CT of the C-spine and head were obtained along with CT angio which showed no acute findings but did show a thyroid nodule she will need to follow up on.   Upon re-evaluation pt was having difficulty ambulating and was concerned about going home and going up and down stairs. Discussed with Dr Carola Frost from ortho and Dr Antionette Char from hospitalist for admission and inpatient repair.   This patient presents to the ED for concern of complaints listed in HPI, this involves an extensive number of treatment options, and is a complaint that carries with it a high risk of complications and morbidity. Disposition including potential need for admission considered.   Dispo: Admit to Floor  Additional history obtained from  friend Records reviewed Outpatient Clinic Notes The following labs were independently interpreted: Chemistry and show no acute abnormality I independently reviewed the following imaging with scope of interpretation limited to determining acute life threatening conditions related to emergency care: Extremity x-ray(s) and agree with the radiologist interpretation with the following exceptions: none I personally reviewed and interpreted cardiac monitoring: normal sinus rhythm  I personally reviewed and interpreted the pt's EKG: see above for interpretation  I have reviewed the patients home medications and made adjustments as needed Consults: Orthopedics Social Determinants of health:  Elderly  Portions of this note were generated with Scientist, clinical (histocompatibility and immunogenetics). Dictation errors may occur despite best attempts at proofreading.     Final Clinical Impression(s) / ED Diagnoses Final diagnoses:  Closed fracture of proximal end of left humerus, unspecified fracture morphology, initial encounter  Vision changes  Fall, initial encounter  Minor head injury, initial encounter  Neck pain    Rx / DC Orders ED Discharge Orders     None         Rondel Baton, MD 02/22/23 [phone]

## 2023-02-22 NOTE — ED Notes (Signed)
ED TO INPATIENT HANDOFF REPORT  ED Nurse Name and Phone #: Amil Amen [phone]  S Name/Age/Gender Sarah Underwood 77 y.o. female Room/Bed: 031C/031C  Code Status   Code Status: Full Code  Home/SNF/Other Home Patient oriented to: self, place, time, and situation Is this baseline? Yes   Triage Complete: Triage complete  Chief Complaint Closed fracture of proximal end of left humerus, unspecified fracture morphology, initial encounter [S42.202A]  Triage Note Pt states she bent down to pick up a box and lost her balance and fell sideways and hit her left shoulder today. Pt states it felt like her left arm detached from her shoulder and states she has a lump in shoulder blade area. Pt states she hit her head gently. Pt states her neck is painful also. Pt states she lost vision in her left eye briefly in the waiting room. Pt denies blood thinners. Pt has 2+ left radial pulse, cap refill less than 3 sec, warm to touch, able to move fingers. Pt states her hand feels weak.    Allergies Allergies  Allergen Reactions   Other Swelling    Trees   Molds & Smuts Other (See Comments)   Erythromycin Nausea And Vomiting   Penicillins Hives and Other (See Comments)    Has patient had a PCN reaction causing immediate rash, facial/tongue/throat swelling, SOB or lightheadedness with hypotension: no Has patient had a PCN reaction causing severe rash involving mucus membranes or skin necrosis: no Has patient had a PCN reaction that required hospitalization no Has patient had a PCN reaction occurring within the last 10 years: no If all of the above answers are "NO", then may proceed with Cephalosporin use.    Terbinafine And Related     rash   Hydrochlorothiazide Rash    Pt states caused a sunburn type reaction    Sulfa Antibiotics Rash    Level of Care/Admitting Diagnosis ED Disposition     ED Disposition  Admit   Condition  --   Comment  Hospital Area: [facility]  [[id number]]  Level of Care: Med-Surg [16]  May place patient in observation at [facility] or [facility] if equivalent level of care is available:: Yes  Covid Evaluation: Asymptomatic - no recent exposure (last 10 days) testing not required  Diagnosis: Closed fracture of proximal end of left humerus, unspecified fracture morphology, initial encounter [[id number]]  Admitting Physician: Briscoe Deutscher [[id number]]  Attending Physician: Briscoe Deutscher [[id number]]          B Medical/Surgery History Past Medical History:  Diagnosis Date   Basal cell carcinoma 1994   right side of node   Benign positional vertigo    with rolling over    Celiac disease    Dysrhythmia    Generalized anxiety disorder    GERD (gastroesophageal reflux disease)    Graves disease 2007   thyroid nodule   Hard of hearing    Heart murmur    History of breast cancer 1995   left - treated with mastectomy   History of radiation therapy 2001   37 sessions following recurrence of breast cancer in left chest wall   Hypertension    Knee pain, bilateral 12/14/2014   Major depressive disorder in remission    Mild persistent asthma, uncomplicated 06/29/2015   Nocturia    OA (osteoarthritis) of hip 09/08/2015   Ovarian mass    small solid nodule 8x94mm: Right ovary - stable since 2000, follow u/s do not show this.  Renal mass 2021   followed by Dr. Berneice Heinrich, [place].  Having every 6 month follow up   Seasonal allergies    Stress fracture of metatarsal bone of left foot 12/15/2019   Vegetarian diet    Past Surgical History:  Procedure Laterality Date   APPENDECTOMY     basal cell removal  7/94   right nose   BREAST BIOPSY Right 1987   benign fibroadenoma   breast cancer recurrence Left 01/2000   at site of left chest wall after previous mastectomy with removal and treatment with radiaton.   CARPAL TUNNEL RELEASE Right 05/08/2013   Procedure: RIGHT CARPAL TUNNEL RELEASE;  Surgeon: Wyn Forster., MD;  Location:  [facility];  Service: Orthopedics;  Laterality: Right;   CATARACT EXTRACTION     03/20/14 left eye, 12/15 right eye   COLPOSCOPY W/ BIOPSY / CURETTAGE  03/12/12   benign, but + HR HPV   EYE SURGERY     bilat cataract surgery    MASTECTOMY Left 3/95   Stage IIb 0/14 LN ER/ PR + with mastectomy - Dr. Mardella Layman   MOHS SURGERY     right nare   OVARY SURGERY  1974   lt   TONSILLECTOMY AND ADENOIDECTOMY  1950   TOTAL HIP ARTHROPLASTY Right 09/08/2015   Procedure: TOTAL HIP ARTHROPLASTY ANTERIOR APPROACH;  Surgeon: Ollen Gross, MD;  Location: [facility];  Service: Orthopedics;  Laterality: Right;     A IV Location/Drains/Wounds Patient Lines/Drains/Airways Status     Active Line/Drains/Airways     Name Placement date Placement time Site Days   Peripheral IV 02/22/23 20 G 1" Right Antecubital 02/22/23  1418  Antecubital  less than 1            Intake/Output Last 24 hours No intake or output data in the 24 hours ending 02/22/23 1930  Labs/Imaging Results for orders placed or performed during the hospital encounter of 02/22/23 (from the past 48 hour(s))  Basic metabolic panel     Status: Abnormal   Collection Time: 02/22/23  2:18 PM  Result Value Ref Range   Sodium 131 (L) 135 - 145 mmol/L   Potassium 4.0 3.5 - 5.1 mmol/L   Chloride 97 (L) 98 - 111 mmol/L   CO2 23 22 - 32 mmol/L   Glucose, Bld 140 (H) 70 - 99 mg/dL    Comment: Glucose reference range applies only to samples taken after fasting for at least 8 hours.   BUN 14 8 - 23 mg/dL   Creatinine, Ser 1.61 0.44 - 1.00 mg/dL   Calcium 9.2 8.9 - 09.6 mg/dL   GFR, Estimated >04 >54 mL/min    Comment: (NOTE) Calculated using the CKD-EPI Creatinine Equation (2021)    Anion gap 11 5 - 15    Comment: Performed at [facility], [address], [place], [place] [address]  CBC     Status: Abnormal   Collection Time: 02/22/23  2:18 PM  Result Value Ref Range   WBC 14.0 (H) 4.0 - 10.5 K/uL   RBC 4.46 3.87 -  5.11 MIL/uL   Hemoglobin 13.1 12.0 - 15.0 g/dL   HCT 91.4 78.2 - 95.6 %   MCV 89.2 80.0 - 100.0 fL   MCH 29.4 26.0 - 34.0 pg   MCHC 32.9 30.0 - 36.0 g/dL   RDW 21.3 08.6 - 57.8 %   Platelets 354 150 - 400 K/uL   nRBC 0.0 0.0 - 0.2 %  Comment: Performed at [facility], [address], [place], [place] [address]  I-stat chem 8, ED (not at [facility], [facility] or [facility])     Status: Abnormal   Collection Time: 02/22/23  3:22 PM  Result Value Ref Range   Sodium 133 (L) 135 - 145 mmol/L   Potassium 4.3 3.5 - 5.1 mmol/L   Chloride 96 (L) 98 - 111 mmol/L   BUN 15 8 - 23 mg/dL   Creatinine, Ser 5.78 0.44 - 1.00 mg/dL   Glucose, Bld 469 (H) 70 - 99 mg/dL    Comment: Glucose reference range applies only to samples taken after fasting for at least 8 hours.   Calcium, Ion 1.21 1.15 - 1.40 mmol/L   TCO2 25 22 - 32 mmol/L   Hemoglobin 13.3 12.0 - 15.0 g/dL   HCT 62.9 52.8 - 41.3 %   CT Shoulder Left Wo Contrast  Result Date: 02/22/2023 CLINICAL DATA:  Left shoulder fracture. EXAM: CT OF THE UPPER LEFT EXTREMITY WITHOUT CONTRAST TECHNIQUE: Multidetector CT imaging of the upper left extremity was performed according to the standard protocol. RADIATION DOSE REDUCTION: This exam was performed according to the departmental dose-optimization program which includes automated exposure control, adjustment of the mA and/or kV according to patient size and/or use of iterative reconstruction technique. COMPARISON:  Same-day radiographs of the right shoulder at 2:38 p.m. FINDINGS: Bones/Joint/Cartilage Acute comminuted impacted fracture of the left proximal humerus with a transverse component at the level of the surgical neck with involvement of the greater and lesser tuberosities. There is approximately 2.2 cm of anterior displacement and 1 cm of lateral displacement of the distal segment. Fracture extends through the articular surface of the humeral head with a displaced fracture fragment perched along the anterior  glenoid rim (series 5, image 48). The glenoid is intact. No additional fracture identified. The acromioclavicular joint is anatomically aligned. Ligaments Ligaments are suboptimally evaluated by CT. Soft tissue Intramuscular edema and hematoma surrounding the comminuted proximal humeral fracture, described above. IMPRESSION: 1. Comminuted, displaced, and impacted fracture of the left proximal humerus involving the surgical neck, greater and lesser tuberosities. 2. Posterior subluxation of the humeral head relative to the glenoid. Fracture involves the articular surface of the humeral head with a displaced fragment perched along the anterior glenoid rim. 3. No additional fracture identified. Electronically Signed   By: Hart Robinsons M.D.   On: 02/22/2023 18:13   CT ANGIO HEAD NECK W WO CM  Result Date: 02/22/2023 CLINICAL DATA:  head strike loc; L eye vision loss transient EXAM: CT ANGIOGRAPHY HEAD AND NECK WITH AND WITHOUT CONTRAST CT CERVICAL SPINE WITHOUT CONTRAST TECHNIQUE: Multidetector CT imaging of the head and neck was performed using the standard protocol during bolus administration of intravenous contrast. Multiplanar CT image reconstructions and MIPs were obtained to evaluate the vascular anatomy. Carotid stenosis measurements (when applicable) are obtained utilizing NASCET criteria, using the distal internal carotid diameter as the denominator. Multidetector CT imaging of the cervical spine was performed without intravenous contrast. Multiplanar CT image reconstructions were also generated. RADIATION DOSE REDUCTION: This exam was performed according to the departmental dose-optimization program which includes automated exposure control, adjustment of the mA and/or kV according to patient size and/or use of iterative reconstruction technique. CONTRAST:  75mL OMNIPAQUE IOHEXOL 350 MG/ML SOLN COMPARISON:  None Available. FINDINGS: CT HEAD FINDINGS Limitations: Note that the presence of iodinated  contrast material limits the ability to assess for intracranial hemorrhage. Brain: No evidence of acute infarction, hemorrhage,  hydrocephalus, extra-axial collection or mass lesion/mass effect. Vascular: No hyperdense vessel or unexpected calcification. Skull: Normal. Negative for fracture or focal lesion. Sinuses/Orbits: No middle ear or mastoid effusion. Paranasal sinuses are clear. Bilateral lens replacement. Orbits are otherwise unremarkable Other: None. Review of the MIP images confirms the above findings CTA NECK FINDINGS Aortic arch: Standard branching. Imaged portion shows no evidence of aneurysm or dissection. No significant stenosis of the major arch vessel origins. Right carotid system: No evidence of dissection, stenosis (50% or greater), or occlusion. Left carotid system: No evidence of dissection, stenosis (50% or greater), or occlusion. Vertebral arteries: Codominant. No evidence of dissection, stenosis (50% or greater), or occlusion. Skeleton: Negative. Other neck: Heterogeneous and multinodular thyroid gland with a dominant nodule in the thyroid isthmus measuring up to 1.9 cm. Upper chest: Negative. Review of the MIP images confirms the above findings CTA HEAD FINDINGS Anterior circulation: No significant stenosis, proximal occlusion, aneurysm, or vascular malformation. Posterior circulation: No significant stenosis, proximal occlusion, aneurysm, or vascular malformation. Venous sinuses: As permitted by contrast timing, patent. Anatomic variants: None Review of the MIP images confirms the above findings CERVICAL SPINE FINDINGS Alignment: Grade 1 anterolisthesis of C2 on C3. Trace anterolisthesis of C7 on T1. Skull base and vertebrae: No acute fracture. No primary bone lesion or focal pathologic process. Soft tissues and spinal canal: No prevertebral fluid or swelling. No visible canal hematoma. Disc levels:  No CT evidence of high-grade spinal canal stenosis. Upper chest: Negative. Other: None.  IMPRESSION: 1. Note that the presence of iodinated contrast markedly limits the ability to assess for intracranial hemorrhage within this limitation, no evidence of an extra-axial fluid collection. 2. No emergent large vessel occlusion. 3. No acute fracture or traumatic listhesis of the cervical spine. 4. Heterogeneous and multinodular thyroid gland with a dominant nodule in the thyroid isthmus measuring up to 1.9 cm. Recommend further evaluation with a nonemergent dedicated thyroid ultrasound if not previously performed. Electronically Signed   By: Lorenza Cambridge M.D.   On: 02/22/2023 17:18   CT C-SPINE NO CHARGE  Result Date: 02/22/2023 CLINICAL DATA:  head strike loc; L eye vision loss transient EXAM: CT ANGIOGRAPHY HEAD AND NECK WITH AND WITHOUT CONTRAST CT CERVICAL SPINE WITHOUT CONTRAST TECHNIQUE: Multidetector CT imaging of the head and neck was performed using the standard protocol during bolus administration of intravenous contrast. Multiplanar CT image reconstructions and MIPs were obtained to evaluate the vascular anatomy. Carotid stenosis measurements (when applicable) are obtained utilizing NASCET criteria, using the distal internal carotid diameter as the denominator. Multidetector CT imaging of the cervical spine was performed without intravenous contrast. Multiplanar CT image reconstructions were also generated. RADIATION DOSE REDUCTION: This exam was performed according to the departmental dose-optimization program which includes automated exposure control, adjustment of the mA and/or kV according to patient size and/or use of iterative reconstruction technique. CONTRAST:  75mL OMNIPAQUE IOHEXOL 350 MG/ML SOLN COMPARISON:  None Available. FINDINGS: CT HEAD FINDINGS Limitations: Note that the presence of iodinated contrast material limits the ability to assess for intracranial hemorrhage. Brain: No evidence of acute infarction, hemorrhage, hydrocephalus, extra-axial collection or mass lesion/mass  effect. Vascular: No hyperdense vessel or unexpected calcification. Skull: Normal. Negative for fracture or focal lesion. Sinuses/Orbits: No middle ear or mastoid effusion. Paranasal sinuses are clear. Bilateral lens replacement. Orbits are otherwise unremarkable Other: None. Review of the MIP images confirms the above findings CTA NECK FINDINGS Aortic arch: Standard branching. Imaged portion shows no evidence of aneurysm or dissection. No significant  stenosis of the major arch vessel origins. Right carotid system: No evidence of dissection, stenosis (50% or greater), or occlusion. Left carotid system: No evidence of dissection, stenosis (50% or greater), or occlusion. Vertebral arteries: Codominant. No evidence of dissection, stenosis (50% or greater), or occlusion. Skeleton: Negative. Other neck: Heterogeneous and multinodular thyroid gland with a dominant nodule in the thyroid isthmus measuring up to 1.9 cm. Upper chest: Negative. Review of the MIP images confirms the above findings CTA HEAD FINDINGS Anterior circulation: No significant stenosis, proximal occlusion, aneurysm, or vascular malformation. Posterior circulation: No significant stenosis, proximal occlusion, aneurysm, or vascular malformation. Venous sinuses: As permitted by contrast timing, patent. Anatomic variants: None Review of the MIP images confirms the above findings CERVICAL SPINE FINDINGS Alignment: Grade 1 anterolisthesis of C2 on C3. Trace anterolisthesis of C7 on T1. Skull base and vertebrae: No acute fracture. No primary bone lesion or focal pathologic process. Soft tissues and spinal canal: No prevertebral fluid or swelling. No visible canal hematoma. Disc levels:  No CT evidence of high-grade spinal canal stenosis. Upper chest: Negative. Other: None. IMPRESSION: 1. Note that the presence of iodinated contrast markedly limits the ability to assess for intracranial hemorrhage within this limitation, no evidence of an extra-axial fluid  collection. 2. No emergent large vessel occlusion. 3. No acute fracture or traumatic listhesis of the cervical spine. 4. Heterogeneous and multinodular thyroid gland with a dominant nodule in the thyroid isthmus measuring up to 1.9 cm. Recommend further evaluation with a nonemergent dedicated thyroid ultrasound if not previously performed. Electronically Signed   By: Lorenza Cambridge M.D.   On: 02/22/2023 17:18   DG Shoulder Left  Result Date: 02/22/2023 CLINICAL DATA:  Shoulder pain. EXAM: LEFT SHOULDER - 2+ VIEW COMPARISON:  None Available. FINDINGS: There is comminuted and displaced fracture of the proximal left humerus with associated dislocation of the shoulder joint. No other acute fracture or dislocation. No aggressive osseous lesion. The acromioclavicular joint is normal in alignment and exhibit mild degenerative changes. No soft tissue swelling. No radiopaque foreign bodies. IMPRESSION: *Comminuted and displaced fracture of the proximal left humerus with shoulder dislocation. Electronically Signed   By: Jules Schick M.D.   On: 02/22/2023 16:36    Pending Labs Unresulted Labs (From admission, onward)     Start     Ordered   02/23/23 0500  Basic metabolic panel  Daily,   R      02/22/23 1927   02/23/23 0500  CBC  Daily,   R      02/22/23 1927            Vitals/Pain Today's Vitals   02/22/23 1802 02/22/23 1805 02/22/23 1853 02/22/23 1928  BP:  (!) 148/73  (!) 164/72  Pulse:  71  80  Resp:  18  16  Temp:   98.1 F (36.7 C) 98.1 F (36.7 C)  TempSrc:   Oral Oral  SpO2:  98%  98%  Weight:      Height:      PainSc: 2    3     Isolation Precautions No active isolations  Medications Medications  anastrozole (ARIMIDEX) tablet 1 mg (has no administration in time range)  amLODipine (NORVASC) tablet 2.5 mg (has no administration in time range)  metoprolol succinate (TOPROL-XL) 24 hr tablet 50 mg (has no administration in time range)  irbesartan (AVAPRO) tablet 300 mg (has no  administration in time range)  albuterol (PROVENTIL) (2.5 MG/3ML) 0.083% nebulizer solution 3 mL (has no  administration in time range)  mometasone-formoterol (DULERA) 100-5 MCG/ACT inhaler 2 puff (has no administration in time range)  acetaminophen (TYLENOL) tablet 650 mg (has no administration in time range)    Or  acetaminophen (TYLENOL) suppository 650 mg (has no administration in time range)  oxyCODONE (Oxy IR/ROXICODONE) immediate release tablet 5 mg (has no administration in time range)  HYDROmorphone (DILAUDID) injection 0.5 mg (has no administration in time range)  polyethylene glycol (MIRALAX / GLYCOLAX) packet 17 g (has no administration in time range)  bisacodyl (DULCOLAX) EC tablet 5 mg (has no administration in time range)  ondansetron (ZOFRAN) tablet 4 mg (has no administration in time range)    Or  ondansetron (ZOFRAN) injection 4 mg (has no administration in time range)  methocarbamol (ROBAXIN) injection 500 mg (has no administration in time range)  HYDROmorphone (DILAUDID) injection 0.5 mg (0.5 mg Intravenous Given 02/22/23 1514)  iohexol (OMNIPAQUE) 350 MG/ML injection 75 mL (75 mLs Intravenous Contrast Given 02/22/23 1629)  acetaminophen (TYLENOL) tablet 1,000 mg (1,000 mg Oral Given 02/22/23 1646)    Mobility walks with person assist     Focused Assessments    R Recommendations: See Admitting Provider Note  Report given to:   Additional Notes: left shoulder fracture for surgery tomorrow, NPO at midnight

## 2023-02-22 NOTE — Progress Notes (Signed)
Orthopedic Tech Progress Note Patient Details:  JAMILETH FEULNER 1945/11/13 [id number] Applied shoulder sling per order.  Ortho Devices Type of Ortho Device: Shoulder immobilizer Ortho Device/Splint Location: LUE Ortho Device/Splint Interventions: Ordered, Application, Adjustment   Post Interventions Patient Tolerated: Fair Instructions Provided: Adjustment of device, Care of device  Blase Mess 02/22/2023, 5:43 PM

## 2023-02-22 NOTE — Progress Notes (Signed)
Consult request received for comminuted left proximal humerus fracture. Have discussed with the requesting MD patient's stability, pain control, and ongoing work up. I have reviewed x-rays and developed a provisional plan for left shoulder arthroplasty by one of my colleagues.   Full consultation to follow in the am.   Myrene Galas, MD [facility], [facility] [phone]

## 2023-02-23 ENCOUNTER — Observation Stay (HOSPITAL_COMMUNITY): Payer: Medicare PPO

## 2023-02-23 DIAGNOSIS — D72829 Elevated white blood cell count, unspecified: Secondary | ICD-10-CM | POA: Diagnosis present

## 2023-02-23 DIAGNOSIS — E041 Nontoxic single thyroid nodule: Secondary | ICD-10-CM | POA: Diagnosis present

## 2023-02-23 DIAGNOSIS — Z880 Allergy status to penicillin: Secondary | ICD-10-CM | POA: Diagnosis not present

## 2023-02-23 DIAGNOSIS — Z882 Allergy status to sulfonamides status: Secondary | ICD-10-CM | POA: Diagnosis not present

## 2023-02-23 DIAGNOSIS — Z79899 Other long term (current) drug therapy: Secondary | ICD-10-CM | POA: Diagnosis not present

## 2023-02-23 DIAGNOSIS — Z888 Allergy status to other drugs, medicaments and biological substances status: Secondary | ICD-10-CM | POA: Diagnosis not present

## 2023-02-23 DIAGNOSIS — M8580 Other specified disorders of bone density and structure, unspecified site: Secondary | ICD-10-CM | POA: Diagnosis present

## 2023-02-23 DIAGNOSIS — Z803 Family history of malignant neoplasm of breast: Secondary | ICD-10-CM | POA: Diagnosis not present

## 2023-02-23 DIAGNOSIS — Z923 Personal history of irradiation: Secondary | ICD-10-CM | POA: Diagnosis not present

## 2023-02-23 DIAGNOSIS — K219 Gastro-esophageal reflux disease without esophagitis: Secondary | ICD-10-CM | POA: Diagnosis present

## 2023-02-23 DIAGNOSIS — M19032 Primary osteoarthritis, left wrist: Secondary | ICD-10-CM | POA: Diagnosis not present

## 2023-02-23 DIAGNOSIS — J4530 Mild persistent asthma, uncomplicated: Secondary | ICD-10-CM | POA: Diagnosis present

## 2023-02-23 DIAGNOSIS — K900 Celiac disease: Secondary | ICD-10-CM | POA: Diagnosis present

## 2023-02-23 DIAGNOSIS — S4290XA Fracture of unspecified shoulder girdle, part unspecified, initial encounter for closed fracture: Secondary | ICD-10-CM | POA: Diagnosis present

## 2023-02-23 DIAGNOSIS — M79642 Pain in left hand: Secondary | ICD-10-CM | POA: Diagnosis not present

## 2023-02-23 DIAGNOSIS — I10 Essential (primary) hypertension: Secondary | ICD-10-CM | POA: Diagnosis present

## 2023-02-23 DIAGNOSIS — S42202A Unspecified fracture of upper end of left humerus, initial encounter for closed fracture: Secondary | ICD-10-CM | POA: Diagnosis not present

## 2023-02-23 DIAGNOSIS — Z85828 Personal history of other malignant neoplasm of skin: Secondary | ICD-10-CM | POA: Diagnosis not present

## 2023-02-23 DIAGNOSIS — Z7951 Long term (current) use of inhaled steroids: Secondary | ICD-10-CM | POA: Diagnosis not present

## 2023-02-23 DIAGNOSIS — Z96641 Presence of right artificial hip joint: Secondary | ICD-10-CM | POA: Diagnosis present

## 2023-02-23 DIAGNOSIS — E871 Hypo-osmolality and hyponatremia: Secondary | ICD-10-CM | POA: Diagnosis present

## 2023-02-23 DIAGNOSIS — Y92009 Unspecified place in unspecified non-institutional (private) residence as the place of occurrence of the external cause: Secondary | ICD-10-CM | POA: Diagnosis not present

## 2023-02-23 DIAGNOSIS — Z9012 Acquired absence of left breast and nipple: Secondary | ICD-10-CM | POA: Diagnosis not present

## 2023-02-23 DIAGNOSIS — Z91048 Other nonmedicinal substance allergy status: Secondary | ICD-10-CM | POA: Diagnosis not present

## 2023-02-23 DIAGNOSIS — Z79811 Long term (current) use of aromatase inhibitors: Secondary | ICD-10-CM | POA: Diagnosis not present

## 2023-02-23 DIAGNOSIS — Z881 Allergy status to other antibiotic agents status: Secondary | ICD-10-CM | POA: Diagnosis not present

## 2023-02-23 DIAGNOSIS — W010XXA Fall on same level from slipping, tripping and stumbling without subsequent striking against object, initial encounter: Secondary | ICD-10-CM | POA: Diagnosis present

## 2023-02-23 DIAGNOSIS — Z8262 Family history of osteoporosis: Secondary | ICD-10-CM | POA: Diagnosis not present

## 2023-02-23 DIAGNOSIS — F413 Other mixed anxiety disorders: Secondary | ICD-10-CM | POA: Diagnosis not present

## 2023-02-23 DIAGNOSIS — Z853 Personal history of malignant neoplasm of breast: Secondary | ICD-10-CM | POA: Diagnosis not present

## 2023-02-23 DIAGNOSIS — S42232A 3-part fracture of surgical neck of left humerus, initial encounter for closed fracture: Secondary | ICD-10-CM | POA: Diagnosis present

## 2023-02-23 LAB — BASIC METABOLIC PANEL
Anion gap: 10 (ref 5–15)
BUN: 16 mg/dL (ref 8–23)
CO2: 22 mmol/L (ref 22–32)
Calcium: 8.6 mg/dL — ABNORMAL LOW (ref 8.9–10.3)
Chloride: 99 mmol/L (ref 98–111)
Creatinine, Ser: 0.82 mg/dL (ref 0.44–1.00)
GFR, Estimated: >60 mL/min (ref >60)
Glucose, Bld: 120 mg/dL — ABNORMAL HIGH (ref 70–99)
Potassium: 4.1 mmol/L (ref 3.5–5.1)
Sodium: 131 mmol/L — ABNORMAL LOW (ref 135–145)

## 2023-02-23 LAB — CBC
HCT: 34.1 % — ABNORMAL LOW (ref 36.0–46.0)
Hemoglobin: 11.5 g/dL — ABNORMAL LOW (ref 12.0–15.0)
MCH: 29.4 pg (ref 26.0–34.0)
MCHC: 33.7 g/dL (ref 30.0–36.0)
MCV: 87.2 fL (ref 80.0–100.0)
Platelets: 277 10*3/uL (ref 150–400)
RBC: 3.91 MIL/uL (ref 3.87–5.11)
RDW: 13.4 % (ref 11.5–15.5)
WBC: 9.9 10*3/uL (ref 4.0–10.5)
nRBC: 0 % (ref 0.0–0.2)

## 2023-02-23 LAB — SURGICAL PCR SCREEN
MRSA, PCR: NEGATIVE
Staphylococcus aureus: NEGATIVE

## 2023-02-23 MED ORDER — BUDESONIDE-FORMOTEROL FUMARATE 80-4.5 MCG/ACT IN AERO
2.0000 | INHALATION_SPRAY | Freq: Two times a day (BID) | RESPIRATORY_TRACT | Status: DC
  Administered 2023-02-24 – 2023-02-27 (×5): 2 via RESPIRATORY_TRACT

## 2023-02-23 NOTE — Consult Note (Signed)
Reason for Consult:Left humerus fx Referring Physician: Glade Lloyd Time called: 0730 Time at bedside: 0941   Sarah Underwood is an 77 y.o. female.  HPI: Sarah Underwood was bending over to pick up a box when she lost her balance and fell onto her left side. She had immediate left shoulder pain and instability. She was brought to the ED where x-rays showed a proximal humerus fx and orthopedic surgery was consulted. She lives alone and is RHD. She does not work.  Past Medical History:  Diagnosis Date   Basal cell carcinoma 1994   right side of node   Benign positional vertigo    with rolling over    Celiac disease    Dysrhythmia    Generalized anxiety disorder    GERD (gastroesophageal reflux disease)    Graves disease 2007   thyroid nodule   Hard of hearing    Heart murmur    History of breast cancer 1995   left - treated with mastectomy   History of radiation therapy 2001   37 sessions following recurrence of breast cancer in left chest wall   Hypertension    Knee pain, bilateral 12/14/2014   Major depressive disorder in remission    Mild persistent asthma, uncomplicated 06/29/2015   Nocturia    OA (osteoarthritis) of hip 09/08/2015   Ovarian mass    small solid nodule 8x42mm: Right ovary - stable since 2000, follow u/s do not show this.   Renal mass 2021   followed by Dr. Berneice Heinrich, [facility].  Having every 6 month follow up   Seasonal allergies    Stress fracture of metatarsal bone of left foot 12/15/2019   Vegetarian diet     Past Surgical History:  Procedure Laterality Date   APPENDECTOMY     basal cell removal  7/94   right nose   BREAST BIOPSY Right 1987   benign fibroadenoma   breast cancer recurrence Left 01/2000   at site of left chest wall after previous mastectomy with removal and treatment with radiaton.   CARPAL TUNNEL RELEASE Right 05/08/2013   Procedure: RIGHT CARPAL TUNNEL RELEASE;  Surgeon: Wyn Forster., MD;  Location: [facility];   Service: Orthopedics;  Laterality: Right;   CATARACT EXTRACTION     03/20/14 left eye, 12/15 right eye   COLPOSCOPY W/ BIOPSY / CURETTAGE  03/12/12   benign, but + HR HPV   EYE SURGERY     bilat cataract surgery    MASTECTOMY Left 3/95   Stage IIb 0/14 LN ER/ PR + with mastectomy - Dr. Mardella Layman   MOHS SURGERY     right nare   OVARY SURGERY  1974   lt   TONSILLECTOMY AND ADENOIDECTOMY  1950   TOTAL HIP ARTHROPLASTY Right 09/08/2015   Procedure: TOTAL HIP ARTHROPLASTY ANTERIOR APPROACH;  Surgeon: Ollen Gross, MD;  Location: [facility];  Service: Orthopedics;  Laterality: Right;    Family History  Problem Relation Age of Onset   Breast cancer Mother 27       deceased 56   Osteoporosis Mother    Hypertension Mother    Dementia Mother        unspecified type   Mental illness Mother    Hypertension Father    Heart attack Father    Dementia Father        vascular dementia with history of "mini-strokes"   Breast cancer Sister 60       bilateral; recurrent 12 years late  Diabetes Brother    Lung cancer Maternal Grandfather        deceased 24   Cancer Paternal Grandmother        duodenal; deceased 27    Social History:  reports that she has never smoked. She has never used smokeless tobacco. She reports current alcohol use. She reports that she does not use drugs.  Allergies:  Allergies  Allergen Reactions   Other Swelling    Trees   Molds & Smuts Other (See Comments)   Erythromycin Nausea And Vomiting   Penicillins Hives and Other (See Comments)    Has patient had a PCN reaction causing immediate rash, facial/tongue/throat swelling, SOB or lightheadedness with hypotension: no Has patient had a PCN reaction causing severe rash involving mucus membranes or skin necrosis: no Has patient had a PCN reaction that required hospitalization no Has patient had a PCN reaction occurring within the last 10 years: no If all of the above answers are "NO", then may proceed with  Cephalosporin use.    Terbinafine And Related     rash   Hydrochlorothiazide Rash    Pt states caused a sunburn type reaction    Sulfa Antibiotics Rash    Medications: I have reviewed the patient's current medications.  Results for orders placed or performed during the hospital encounter of 02/22/23 (from the past 48 hour(s))  Basic metabolic panel     Status: Abnormal   Collection Time: 02/22/23  2:18 PM  Result Value Ref Range   Sodium 131 (L) 135 - 145 mmol/L   Potassium 4.0 3.5 - 5.1 mmol/L   Chloride 97 (L) 98 - 111 mmol/L   CO2 23 22 - 32 mmol/L   Glucose, Bld 140 (H) 70 - 99 mg/dL    Comment: Glucose reference range applies only to samples taken after fasting for at least 8 hours.   BUN 14 8 - 23 mg/dL   Creatinine, Ser 1.61 0.44 - 1.00 mg/dL   Calcium 9.2 8.9 - 09.6 mg/dL   GFR, Estimated >04 >54 mL/min    Comment: (NOTE) Calculated using the CKD-EPI Creatinine Equation (2021)    Anion gap 11 5 - 15    Comment: Performed at [facility], [address] [place], [place] [address]  CBC     Status: Abnormal   Collection Time: 02/22/23  2:18 PM  Result Value Ref Range   WBC 14.0 (H) 4.0 - 10.5 K/uL   RBC 4.46 3.87 - 5.11 MIL/uL   Hemoglobin 13.1 12.0 - 15.0 g/dL   HCT 91.4 78.2 - 95.6 %   MCV 89.2 80.0 - 100.0 fL   MCH 29.4 26.0 - 34.0 pg   MCHC 32.9 30.0 - 36.0 g/dL   RDW 21.3 08.6 - 57.8 %   Platelets 354 150 - 400 K/uL   nRBC 0.0 0.0 - 0.2 %    Comment: Performed at [facility], [address], [place], [place] [address]  I-stat chem 8, ED (not at [facility], [facility] or [facility])     Status: Abnormal   Collection Time: 02/22/23  3:22 PM  Result Value Ref Range   Sodium 133 (L) 135 - 145 mmol/L   Potassium 4.3 3.5 - 5.1 mmol/L   Chloride 96 (L) 98 - 111 mmol/L   BUN 15 8 - 23 mg/dL   Creatinine, Ser 9.52 0.44 - 1.00 mg/dL   Glucose, Bld 841 (H) 70 - 99 mg/dL    Comment: Glucose reference range  applies only to samples taken after fasting for at least 8 hours.    Calcium, Ion 1.21 1.15 - 1.40 mmol/L   TCO2 25 22 - 32 mmol/L   Hemoglobin 13.3 12.0 - 15.0 g/dL   HCT 25.3 66.4 - 40.3 %  Surgical PCR screen     Status: None   Collection Time: 02/22/23 11:29 PM   Specimen: Nasal Mucosa; Nasal Swab  Result Value Ref Range   MRSA, PCR NEGATIVE NEGATIVE   Staphylococcus aureus NEGATIVE NEGATIVE    Comment: (NOTE) The Xpert SA Assay (FDA approved for NASAL specimens in patients 17 years of age and older), is one component of a comprehensive surveillance program. It is not intended to diagnose infection nor to guide or monitor treatment. Performed at [facility], [address], [place], [place] [address]   Basic metabolic panel     Status: Abnormal   Collection Time: 02/23/23  7:27 AM  Result Value Ref Range   Sodium 131 (L) 135 - 145 mmol/L   Potassium 4.1 3.5 - 5.1 mmol/L   Chloride 99 98 - 111 mmol/L   CO2 22 22 - 32 mmol/L   Glucose, Bld 120 (H) 70 - 99 mg/dL    Comment: Glucose reference range applies only to samples taken after fasting for at least 8 hours.   BUN 16 8 - 23 mg/dL   Creatinine, Ser 9.56 0.44 - 1.00 mg/dL   Calcium 8.6 (L) 8.9 - 10.3 mg/dL   GFR, Estimated >38 >75 mL/min    Comment: (NOTE) Calculated using the CKD-EPI Creatinine Equation (2021)    Anion gap 10 5 - 15    Comment: Performed at [facility], [address], [place], [place] [address]  CBC     Status: Abnormal   Collection Time: 02/23/23  7:27 AM  Result Value Ref Range   WBC 9.9 4.0 - 10.5 K/uL   RBC 3.91 3.87 - 5.11 MIL/uL   Hemoglobin 11.5 (L) 12.0 - 15.0 g/dL   HCT 95.1 (L) 88.4 - 16.6 %   MCV 87.2 80.0 - 100.0 fL   MCH 29.4 26.0 - 34.0 pg   MCHC 33.7 30.0 - 36.0 g/dL   RDW 06.3 01.6 - 01.0 %   Platelets 277 150 - 400 K/uL   nRBC 0.0 0.0 - 0.2 %    Comment: Performed at [facility], [address], [place], [place] [address]    CT Shoulder Left Wo Contrast  Result Date: 02/22/2023 CLINICAL DATA:  Left shoulder fracture.  EXAM: CT OF THE UPPER LEFT EXTREMITY WITHOUT CONTRAST TECHNIQUE: Multidetector CT imaging of the upper left extremity was performed according to the standard protocol. RADIATION DOSE REDUCTION: This exam was performed according to the departmental dose-optimization program which includes automated exposure control, adjustment of the mA and/or kV according to patient size and/or use of iterative reconstruction technique. COMPARISON:  Same-day radiographs of the right shoulder at 2:38 p.m. FINDINGS: Bones/Joint/Cartilage Acute comminuted impacted fracture of the left proximal humerus with a transverse component at the level of the surgical neck with involvement of the greater and lesser tuberosities. There is approximately 2.2 cm of anterior displacement and 1 cm of lateral displacement of the distal segment. Fracture extends through the articular surface of the humeral head with a displaced fracture fragment perched along the anterior glenoid rim (series 5, image 48). The glenoid is intact. No additional fracture identified. The acromioclavicular joint is anatomically aligned. Ligaments Ligaments are suboptimally  evaluated by CT. Soft tissue Intramuscular edema and hematoma surrounding the comminuted proximal humeral fracture, described above. IMPRESSION: 1. Comminuted, displaced, and impacted fracture of the left proximal humerus involving the surgical neck, greater and lesser tuberosities. 2. Posterior subluxation of the humeral head relative to the glenoid. Fracture involves the articular surface of the humeral head with a displaced fragment perched along the anterior glenoid rim. 3. No additional fracture identified. Electronically Signed   By: Hart Robinsons M.D.   On: 02/22/2023 18:13   CT ANGIO HEAD NECK W WO CM  Result Date: 02/22/2023 CLINICAL DATA:  head strike loc; L eye vision loss transient EXAM: CT ANGIOGRAPHY HEAD AND NECK WITH AND WITHOUT CONTRAST CT CERVICAL SPINE WITHOUT CONTRAST TECHNIQUE:  Multidetector CT imaging of the head and neck was performed using the standard protocol during bolus administration of intravenous contrast. Multiplanar CT image reconstructions and MIPs were obtained to evaluate the vascular anatomy. Carotid stenosis measurements (when applicable) are obtained utilizing NASCET criteria, using the distal internal carotid diameter as the denominator. Multidetector CT imaging of the cervical spine was performed without intravenous contrast. Multiplanar CT image reconstructions were also generated. RADIATION DOSE REDUCTION: This exam was performed according to the departmental dose-optimization program which includes automated exposure control, adjustment of the mA and/or kV according to patient size and/or use of iterative reconstruction technique. CONTRAST:  75mL OMNIPAQUE IOHEXOL 350 MG/ML SOLN COMPARISON:  None Available. FINDINGS: CT HEAD FINDINGS Limitations: Note that the presence of iodinated contrast material limits the ability to assess for intracranial hemorrhage. Brain: No evidence of acute infarction, hemorrhage, hydrocephalus, extra-axial collection or mass lesion/mass effect. Vascular: No hyperdense vessel or unexpected calcification. Skull: Normal. Negative for fracture or focal lesion. Sinuses/Orbits: No middle ear or mastoid effusion. Paranasal sinuses are clear. Bilateral lens replacement. Orbits are otherwise unremarkable Other: None. Review of the MIP images confirms the above findings CTA NECK FINDINGS Aortic arch: Standard branching. Imaged portion shows no evidence of aneurysm or dissection. No significant stenosis of the major arch vessel origins. Right carotid system: No evidence of dissection, stenosis (50% or greater), or occlusion. Left carotid system: No evidence of dissection, stenosis (50% or greater), or occlusion. Vertebral arteries: Codominant. No evidence of dissection, stenosis (50% or greater), or occlusion. Skeleton: Negative. Other neck:  Heterogeneous and multinodular thyroid gland with a dominant nodule in the thyroid isthmus measuring up to 1.9 cm. Upper chest: Negative. Review of the MIP images confirms the above findings CTA HEAD FINDINGS Anterior circulation: No significant stenosis, proximal occlusion, aneurysm, or vascular malformation. Posterior circulation: No significant stenosis, proximal occlusion, aneurysm, or vascular malformation. Venous sinuses: As permitted by contrast timing, patent. Anatomic variants: None Review of the MIP images confirms the above findings CERVICAL SPINE FINDINGS Alignment: Grade 1 anterolisthesis of C2 on C3. Trace anterolisthesis of C7 on T1. Skull base and vertebrae: No acute fracture. No primary bone lesion or focal pathologic process. Soft tissues and spinal canal: No prevertebral fluid or swelling. No visible canal hematoma. Disc levels:  No CT evidence of high-grade spinal canal stenosis. Upper chest: Negative. Other: None. IMPRESSION: 1. Note that the presence of iodinated contrast markedly limits the ability to assess for intracranial hemorrhage within this limitation, no evidence of an extra-axial fluid collection. 2. No emergent large vessel occlusion. 3. No acute fracture or traumatic listhesis of the cervical spine. 4. Heterogeneous and multinodular thyroid gland with a dominant nodule in the thyroid isthmus measuring up to 1.9 cm. Recommend further evaluation with a nonemergent dedicated  thyroid ultrasound if not previously performed. Electronically Signed   By: Lorenza Cambridge M.D.   On: 02/22/2023 17:18   CT C-SPINE NO CHARGE  Result Date: 02/22/2023 CLINICAL DATA:  head strike loc; L eye vision loss transient EXAM: CT ANGIOGRAPHY HEAD AND NECK WITH AND WITHOUT CONTRAST CT CERVICAL SPINE WITHOUT CONTRAST TECHNIQUE: Multidetector CT imaging of the head and neck was performed using the standard protocol during bolus administration of intravenous contrast. Multiplanar CT image reconstructions and  MIPs were obtained to evaluate the vascular anatomy. Carotid stenosis measurements (when applicable) are obtained utilizing NASCET criteria, using the distal internal carotid diameter as the denominator. Multidetector CT imaging of the cervical spine was performed without intravenous contrast. Multiplanar CT image reconstructions were also generated. RADIATION DOSE REDUCTION: This exam was performed according to the departmental dose-optimization program which includes automated exposure control, adjustment of the mA and/or kV according to patient size and/or use of iterative reconstruction technique. CONTRAST:  75mL OMNIPAQUE IOHEXOL 350 MG/ML SOLN COMPARISON:  None Available. FINDINGS: CT HEAD FINDINGS Limitations: Note that the presence of iodinated contrast material limits the ability to assess for intracranial hemorrhage. Brain: No evidence of acute infarction, hemorrhage, hydrocephalus, extra-axial collection or mass lesion/mass effect. Vascular: No hyperdense vessel or unexpected calcification. Skull: Normal. Negative for fracture or focal lesion. Sinuses/Orbits: No middle ear or mastoid effusion. Paranasal sinuses are clear. Bilateral lens replacement. Orbits are otherwise unremarkable Other: None. Review of the MIP images confirms the above findings CTA NECK FINDINGS Aortic arch: Standard branching. Imaged portion shows no evidence of aneurysm or dissection. No significant stenosis of the major arch vessel origins. Right carotid system: No evidence of dissection, stenosis (50% or greater), or occlusion. Left carotid system: No evidence of dissection, stenosis (50% or greater), or occlusion. Vertebral arteries: Codominant. No evidence of dissection, stenosis (50% or greater), or occlusion. Skeleton: Negative. Other neck: Heterogeneous and multinodular thyroid gland with a dominant nodule in the thyroid isthmus measuring up to 1.9 cm. Upper chest: Negative. Review of the MIP images confirms the above findings  CTA HEAD FINDINGS Anterior circulation: No significant stenosis, proximal occlusion, aneurysm, or vascular malformation. Posterior circulation: No significant stenosis, proximal occlusion, aneurysm, or vascular malformation. Venous sinuses: As permitted by contrast timing, patent. Anatomic variants: None Review of the MIP images confirms the above findings CERVICAL SPINE FINDINGS Alignment: Grade 1 anterolisthesis of C2 on C3. Trace anterolisthesis of C7 on T1. Skull base and vertebrae: No acute fracture. No primary bone lesion or focal pathologic process. Soft tissues and spinal canal: No prevertebral fluid or swelling. No visible canal hematoma. Disc levels:  No CT evidence of high-grade spinal canal stenosis. Upper chest: Negative. Other: None. IMPRESSION: 1. Note that the presence of iodinated contrast markedly limits the ability to assess for intracranial hemorrhage within this limitation, no evidence of an extra-axial fluid collection. 2. No emergent large vessel occlusion. 3. No acute fracture or traumatic listhesis of the cervical spine. 4. Heterogeneous and multinodular thyroid gland with a dominant nodule in the thyroid isthmus measuring up to 1.9 cm. Recommend further evaluation with a nonemergent dedicated thyroid ultrasound if not previously performed. Electronically Signed   By: Lorenza Cambridge M.D.   On: 02/22/2023 17:18   DG Shoulder Left  Result Date: 02/22/2023 CLINICAL DATA:  Shoulder pain. EXAM: LEFT SHOULDER - 2+ VIEW COMPARISON:  None Available. FINDINGS: There is comminuted and displaced fracture of the proximal left humerus with associated dislocation of the shoulder joint. No other acute fracture or dislocation.  No aggressive osseous lesion. The acromioclavicular joint is normal in alignment and exhibit mild degenerative changes. No soft tissue swelling. No radiopaque foreign bodies. IMPRESSION: *Comminuted and displaced fracture of the proximal left humerus with shoulder dislocation.  Electronically Signed   By: Jules Schick M.D.   On: 02/22/2023 16:36    Review of Systems  HENT:  Negative for ear discharge, ear pain, hearing loss and tinnitus.   Eyes:  Negative for photophobia and pain.  Respiratory:  Negative for cough and shortness of breath.   Cardiovascular:  Negative for chest pain.  Gastrointestinal:  Negative for abdominal pain, nausea and vomiting.  Genitourinary:  Negative for dysuria, flank pain, frequency and urgency.  Musculoskeletal:  Positive for arthralgias (Left shoulder). Negative for back pain, myalgias and neck pain.  Neurological:  Negative for dizziness and headaches.  Hematological:  Does not bruise/bleed easily.  Psychiatric/Behavioral:  The patient is not nervous/anxious.    Blood pressure (!) 132/59, pulse 84, temperature 98.5 F (36.9 C), temperature source Oral, resp. rate 18, height 5\' 2"  (1.575 m), weight 69.7 kg, last menstrual period 04/25/2003, SpO2 99%. Physical Exam Constitutional:      General: She is not in acute distress.    Appearance: She is well-developed. She is not diaphoretic.  HENT:     Head: Normocephalic and atraumatic.  Eyes:     General: No scleral icterus.       Right eye: No discharge.        Left eye: No discharge.     Conjunctiva/sclera: Conjunctivae normal.  Cardiovascular:     Rate and Rhythm: Normal rate and regular rhythm.  Pulmonary:     Effort: Pulmonary effort is normal. No respiratory distress.  Musculoskeletal:     Cervical back: Normal range of motion.     Comments: Left shoulder, elbow, wrist, digits- no skin wounds, mod TTP, in sling, no instability, no blocks to motion  Sens  Ax/R/M/U intact  Mot   Ax/ R/ PIN/ M/ AIN/ U intact  Rad 1+  Skin:    General: Skin is warm and dry.  Neurological:     Mental Status: She is alert.  Psychiatric:        Mood and Affect: Mood normal.        Behavior: Behavior normal.     Assessment/Plan: Left proximal humerus fx -- Plan reverse shoulder Monday  with Dr. Aundria Rud. PT/OT in meantime, NWB in sling.    Freeman Caldron, PA-C Orthopedic Surgery [phone] 02/23/2023, 9:58 AM

## 2023-02-23 NOTE — Evaluation (Signed)
Occupational Therapy Evaluation Patient Details Name: Sarah Underwood MRN: [id number] DOB: January 12, 1946 Today's Date: 02/23/2023   History of Present Illness 77 y.o. female with medical history significant for hypertension, asthma, osteoarthritis, and breast cancer in clinical remission who presents with left shoulder pain after fall at home.  X-rays showed a proximal humerus fx with TSA - Reverse planned for Monday.   Clinical Impression   Patient admitted for the diagnosis above.  Planned TSA on Monday.  PTA she lives alone at home, and needed no assist with ADL, iADL, walks with a SPC at times depending on R knee, and continues to drive.  Obvious pain to R unstable humeral fx is the deficit.  OT will follow in the acute setting.  TSA education sheets brought, patient would like to transition home, but is open to ST Rehab if needed.  The patient has a large contingent of friends to provide food and supportive assist/community mobility.  OT to see post surgery to assist with recommendations.        If plan is discharge home, recommend the following: A little help with walking and/or transfers;A lot of help with bathing/dressing/bathroom;Assist for transportation;Assistance with cooking/housework    Functional Status Assessment  Patient has had a recent decline in their functional status and demonstrates the ability to make significant improvements in function in a reasonable and predictable amount of time.  Equipment Recommendations  None recommended by OT    Recommendations for Other Services       Precautions / Restrictions Precautions Precautions: Fall Restrictions Weight Bearing Restrictions: Yes LUE Weight Bearing: Non weight bearing      Mobility Bed Mobility Overal bed mobility: Needs Assistance Bed Mobility: Supine to Sit     Supine to sit: Supervision, Contact guard, HOB elevated          Transfers Overall transfer level: Needs assistance Equipment used: Rolling  walker (2 wheels) Transfers: Sit to/from Stand, Bed to chair/wheelchair/BSC Sit to Stand: Supervision, Contact guard assist     Step pivot transfers: Supervision, Contact guard assist     General transfer comment: Bad R knee      Balance                                           ADL either performed or assessed with clinical judgement   ADL       Grooming: Contact guard assist;Standing           Upper Body Dressing : Moderate assistance;Standing;Sitting   Lower Body Dressing: Minimal assistance;Sit to/from stand   Toilet Transfer: Supervision/safety;Contact guard Actor;Ambulation                   Vision Patient Visual Report: No change from baseline       Perception Perception: Within Functional Limits       Praxis Praxis: WFL       Pertinent Vitals/Pain Pain Assessment Pain Assessment: Faces Faces Pain Scale: Hurts whole lot Pain Location: L arm Pain Descriptors / Indicators: Sharp Pain Intervention(s): Premedicated before session     Extremity/Trunk Assessment Upper Extremity Assessment Upper Extremity Assessment: LUE deficits/detail LUE Deficits / Details: Unstable fracture LUE Sensation: WNL   Lower Extremity Assessment Lower Extremity Assessment: Defer to PT evaluation   Cervical / Trunk Assessment Cervical / Trunk Assessment: Normal   Communication     Cognition Arousal: Alert  Behavior During Therapy: [facility] for tasks assessed/performed Overall Cognitive Status: Within Functional Limits for tasks assessed                                       General Comments   Able to perform fist pumps, educated on pillows for positioning and WB precautions with good understanding.      Exercises     Shoulder Instructions      Home Living Family/patient expects to be discharged to:: Private residence Living Arrangements: Alone Available Help at Discharge: Friend(s);Available  PRN/intermittently Type of Home: House Home Access: Stairs to enter Entrance Stairs-Number of Steps: 1   Home Layout: Two level;Bed/bath upstairs Alternate Level Stairs-Number of Steps: 14 Alternate Level Stairs-Rails: Right Bathroom Shower/Tub: Chief Strategy Officer: Standard Bathroom Accessibility: Yes How Accessible: Accessible via walker Home Equipment: Shower seat - built in          Prior Functioning/Environment Prior Level of Function : Independent/Modified Independent;Driving                        OT Problem List: Decreased range of motion;Impaired balance (sitting and/or standing);Pain      OT Treatment/Interventions: Self-care/ADL training;Therapeutic activities;Balance training;Patient/family education;Therapeutic exercise    OT Goals(Current goals can be found in the care plan section) Acute Rehab OT Goals Patient Stated Goal: Hoping to return home, but open to ST Rehab if needed OT Goal Formulation: With patient Time For Goal Achievement: 03/09/23 Potential to Achieve Goals: Good ADL Goals Pt Will Perform Upper Body Bathing: with supervision;sitting;standing Pt Will Perform Upper Body Dressing: with supervision;sitting;standing Pt Will Perform Lower Body Dressing: with supervision;sit to/from stand Pt Will Transfer to Toilet: with modified independence;ambulating;regular height toilet Pt/caregiver will Perform Home Exercise Program: Increased ROM;With written HEP provided  OT Frequency: Min 1X/week    Co-evaluation              AM-PAC OT "6 Clicks" Daily Activity     Outcome Measure Help from another person eating meals?: A Little Help from another person taking care of personal grooming?: A Little Help from another person toileting, which includes using toliet, bedpan, or urinal?: A Little Help from another person bathing (including washing, rinsing, drying)?: A Lot Help from another person to put on and taking off regular  upper body clothing?: A Lot Help from another person to put on and taking off regular lower body clothing?: A Little 6 Click Score: 16   End of Session Equipment Utilized During Treatment: Gait belt Nurse Communication: Mobility status  Activity Tolerance: Patient tolerated treatment well Patient left: in chair;with call bell/phone within reach  OT Visit Diagnosis: Unsteadiness on feet (R26.81);Pain Pain - Right/Left: Left Pain - part of body: Shoulder                Time: 2536-6440 OT Time Calculation (min): 29 min Charges:  OT General Charges $OT Visit: 1 Visit OT Evaluation $OT Eval Moderate Complexity: 1 Mod OT Treatments $Self Care/Home Management : 8-22 mins  02/23/2023  RP, OTR/L  Acute Rehabilitation Services  Office:  [phone]   Suzanna Obey 02/23/2023, 12:43 PM

## 2023-02-23 NOTE — Evaluation (Signed)
Physical Therapy Evaluation Patient Details Name: Sarah Underwood MRN: [id number] DOB: 11-Aug-1945 Today's Date: 02/23/2023  History of Present Illness  77 y.o. female with medical history significant for hypertension, asthma, osteoarthritis, and breast cancer in clinical remission who presents with left shoulder pain after fall at home.  X-rays showed a proximal humerus fx with TSA - Reverse planned for Monday.  Clinical Impression   Pt presents with generalized weakness with history of R knee arthritic dysfunction (waiting on knee aspiration), impaired balance with history of neuropathy and falls, increased time and effort to mobilize, and decreased activity tolerance vs baseline. Pt to benefit from acute PT to address deficits. Pt ambulated good hallway distance without AD and increased time, pt with varying BOS given neuropathy. Pt hopeful to d/c home, lives alone but has friends and neighbors to assist as needed. Will await mobility post-op. PT to progress mobility as tolerated, and will continue to follow acutely.          If plan is discharge home, recommend the following: A little help with walking and/or transfers;A little help with bathing/dressing/bathroom   Can travel by private vehicle        Equipment Recommendations None recommended by PT  Recommendations for Other Services       Functional Status Assessment Patient has had a recent decline in their functional status and demonstrates the ability to make significant improvements in function in a reasonable and predictable amount of time.     Precautions / Restrictions Precautions Precautions: Fall Required Braces or Orthoses: Sling Restrictions Weight Bearing Restrictions: Yes LUE Weight Bearing: Non weight bearing      Mobility  Bed Mobility               General bed mobility comments: up in chair    Transfers Overall transfer level: Needs assistance   Transfers: Sit to/from Stand Sit to Stand:  Supervision           General transfer comment: for safety, slow to rise and sit. stand x2 from EOB.    Ambulation/Gait Ambulation/Gait assistance: Supervision Gait Distance (Feet): 500 Feet Assistive device: None Gait Pattern/deviations: Step-through pattern, Decreased stride length Gait velocity: decr     General Gait Details: for safety, pt with varying BOS which pt attributes to neuropathy, states she stumbles often due to this.  Stairs            Wheelchair Mobility     Tilt Bed    Modified Rankin (Stroke Patients Only)       Balance Overall balance assessment: Needs assistance Sitting-balance support: No upper extremity supported, Feet supported Sitting balance-Leahy Scale: Good     Standing balance support: No upper extremity supported, During functional activity Standing balance-Leahy Scale: Fair                               Pertinent Vitals/Pain Pain Assessment Pain Assessment: Faces Faces Pain Scale: Hurts even more Pain Location: L arm Pain Descriptors / Indicators: Sharp, Discomfort, Grimacing Pain Intervention(s): Monitored during session, Limited activity within patient's tolerance, Repositioned    Home Living Family/patient expects to be discharged to:: Private residence Living Arrangements: Alone Available Help at Discharge: Friend(s);Available PRN/intermittently Type of Home: House Home Access: Stairs to enter   Entrance Stairs-Number of Steps: 1 Alternate Level Stairs-Number of Steps: 14 Home Layout: Two level;Bed/bath upstairs Home Equipment: Shower seat - built in      Prior Function  Prior Level of Function : Independent/Modified Independent;Driving                     Extremity/Trunk Assessment   Upper Extremity Assessment Upper Extremity Assessment: LUE deficits/detail LUE Deficits / Details: Unstable fracture, in sling LUE Sensation: WNL    Lower Extremity Assessment Lower Extremity Assessment:  Generalized weakness;LLE deficits/detail;RLE deficits/detail (history of OA, R>L) RLE Sensation: history of peripheral neuropathy LLE Sensation: history of peripheral neuropathy    Cervical / Trunk Assessment Cervical / Trunk Assessment: Normal  Communication      Cognition Arousal: Alert Behavior During Therapy: WFL for tasks assessed/performed Overall Cognitive Status: Within Functional Limits for tasks assessed                                          General Comments      Exercises     Assessment/Plan    PT Assessment Patient needs continued PT services  PT Problem List Decreased strength;Decreased mobility;Decreased activity tolerance;Decreased balance;Decreased knowledge of use of DME;Pain;Decreased knowledge of precautions       PT Treatment Interventions DME instruction;Therapeutic activities;Gait training;Therapeutic exercise;Patient/family education;Balance training;Stair training;Functional mobility training;Neuromuscular re-education    PT Goals (Current goals can be found in the Care Plan section)  Acute Rehab PT Goals Patient Stated Goal: home if able PT Goal Formulation: With patient Time For Goal Achievement: 03/09/23 Potential to Achieve Goals: Good    Frequency Min 1X/week     Co-evaluation               AM-PAC PT "6 Clicks" Mobility  Outcome Measure Help needed turning from your back to your side while in a flat bed without using bedrails?: A Little Help needed moving from lying on your back to sitting on the side of a flat bed without using bedrails?: A Little Help needed moving to and from a bed to a chair (including a wheelchair)?: A Little Help needed standing up from a chair using your arms (e.g., wheelchair or bedside chair)?: A Little Help needed to walk in hospital room?: A Little Help needed climbing 3-5 steps with a railing? : A Little 6 Click Score: 18    End of Session Equipment Utilized During Treatment:  Other (comment) (sling) Activity Tolerance: Patient tolerated treatment well Patient left: in chair;with call bell/phone within reach;with family/visitor present;Other (comment) (pt up in chair upon PT arrival to room without chair alarm, pt expresses she will press call button and wait for assist prior to mobilizing out of chair, pt's friend at bedside confirms) Nurse Communication: Mobility status PT Visit Diagnosis: Other abnormalities of gait and mobility (R26.89);Muscle weakness (generalized) (M62.81)    Time: 2130-8657 PT Time Calculation (min) (ACUTE ONLY): 26 min   Charges:   PT Evaluation $PT Eval Low Complexity: 1 Low PT Treatments $Gait Training: 8-22 mins PT General Charges $$ ACUTE PT VISIT: 1 Visit         Marye Round, PT DPT Acute Rehabilitation Services Secure Chat Preferred  Office [phone]   Pauletta Pickney E Christain [place] 02/23/2023, 4:18 PM

## 2023-02-23 NOTE — Progress Notes (Signed)
Pt transferred from ED to 5N-13. She is alert and fully oriented x 5, stable hemodynamically, afebrile, no distress at arrival. Left shoulder is on arm sling, pain is well controlled. We will continue to monitor.  Filiberto Pinks, RN

## 2023-02-23 NOTE — Progress Notes (Signed)
PROGRESS NOTE    Sarah Underwood  ONG:[id number] DOB: 1945-11-14 DOA: 02/22/2023 PCP: Alysia Penna, MD   Brief Narrative:  77 y.o. female with medical history significant for hypertension, asthma, osteoarthritis, and breast cancer in clinical remission presented with left shoulder pain after a fall at home and was found to have proximal left humerus fracture with dislocation.  Orthopedic surgery has been consulted.  Assessment & Plan:   Proximal left humerus fracture after mechanical fall - Pain-control, immobilization, supportive care, follow-up on orthopedic surgery recommendations   Leukocytosis -Possibly reactive.  No labs today  Hyponatremia -Mild.  No labs today.  Hypertension -Continue amlodipine, irbesartan and metoprolol succinate  Asthma -Not in exacerbation.  Continue current inhaled regimen  Breast cancer - Followed by Dr. Truett Perna and in clinical remission as of last office visit in April  - Continue Arimide  Thyroid nodule -Noted incidentally on CT in ED  -Outpatient follow-up recommended    DVT prophylaxis: SCDs Code Status: Full Family Communication: Friend at bedside Disposition Plan: Status is: Observation The patient will require care spanning > 2 midnights and should be moved to inpatient because: Of need for orthopedic evaluation and possible surgical intervention  Consultants: Orthopedics  Procedures: None  Antimicrobials: None   Subjective: Patient seen and examined at bedside.  Complains of left upper extremity pain.  No vomiting, chest pain reported.  Complains of left wrist pain as well.  Objective: Vitals:   02/22/23 1928 02/22/23 2010 02/22/23 2310 02/23/23 0438  BP: (!) 164/72 (!) 158/76 124/61 (!) 132/59  Pulse: 80 75 76 84  Resp: 16 20 18 18   Temp: 98.1 F (36.7 C) 98.4 F (36.9 C) 98.6 F (37 C) 98.5 F (36.9 C)  TempSrc: Oral Oral Oral Oral  SpO2: 98% 100% 98% 99%  Weight:      Height:        Intake/Output  Summary (Last 24 hours) at 02/23/2023 0805 Last data filed at 02/22/2023 2300 Gross per 24 hour  Intake 500 ml  Output --  Net 500 ml   Filed Weights   02/22/23 1347  Weight: 69.7 kg    Examination:  General exam: Appears calm and comfortable.  On room air. Respiratory system: Bilateral decreased breath sounds at bases Cardiovascular system: S1 & S2 heard, Rate controlled Gastrointestinal system: Abdomen is nondistended, soft and nontender. Normal bowel sounds heard. Extremities: No cyanosis, clubbing, edema.  Left upper extremity in sling.  Data Reviewed: I have personally reviewed following labs and imaging studies  CBC: Recent Labs  Lab 02/22/23 1418 02/22/23 1522  WBC 14.0*  --   HGB 13.1 13.3  HCT 39.8 39.0  MCV 89.2  --   PLT 354  --    Basic Metabolic Panel: Recent Labs  Lab 02/22/23 1418 02/22/23 1522  NA 131* 133*  K 4.0 4.3  CL 97* 96*  CO2 23  --   GLUCOSE 140* 125*  BUN 14 15  CREATININE 0.93 0.80  CALCIUM 9.2  --    GFR: Estimated Creatinine Clearance: 53.8 mL/min (by C-G formula based on SCr of 0.8 mg/dL). Liver Function Tests: No results for input(s): "AST", "ALT", "ALKPHOS", "BILITOT", "PROT", "ALBUMIN" in the last 168 hours. No results for input(s): "LIPASE", "AMYLASE" in the last 168 hours. No results for input(s): "AMMONIA" in the last 168 hours. Coagulation Profile: No results for input(s): "INR", "PROTIME" in the last 168 hours. Cardiac Enzymes: No results for input(s): "CKTOTAL", "CKMB", "CKMBINDEX", "TROPONINI" in the last 168 hours. BNP (  last 3 results) No results for input(s): "PROBNP" in the last 8760 hours. HbA1C: No results for input(s): "HGBA1C" in the last 72 hours. CBG: No results for input(s): "GLUCAP" in the last 168 hours. Lipid Profile: No results for input(s): "CHOL", "HDL", "LDLCALC", "TRIG", "CHOLHDL", "LDLDIRECT" in the last 72 hours. Thyroid Function Tests: No results for input(s): "TSH", "T4TOTAL", "FREET4",  "T3FREE", "THYROIDAB" in the last 72 hours. Anemia Panel: No results for input(s): "VITAMINB12", "FOLATE", "FERRITIN", "TIBC", "IRON", "RETICCTPCT" in the last 72 hours. Sepsis Labs: No results for input(s): "PROCALCITON", "LATICACIDVEN" in the last 168 hours.  Recent Results (from the past 240 hour(s))  Surgical PCR screen     Status: None   Collection Time: 02/22/23 11:29 PM   Specimen: Nasal Mucosa; Nasal Swab  Result Value Ref Range Status   MRSA, PCR NEGATIVE NEGATIVE Final   Staphylococcus aureus NEGATIVE NEGATIVE Final    Comment: (NOTE) The Xpert SA Assay (FDA approved for NASAL specimens in patients 60 years of age and older), is one component of a comprehensive surveillance program. It is not intended to diagnose infection nor to guide or monitor treatment. Performed at [facility], [address] [place], [place] [address]          Radiology Studies: CT Shoulder Left Wo Contrast  Result Date: 02/22/2023 CLINICAL DATA:  Left shoulder fracture. EXAM: CT OF THE UPPER LEFT EXTREMITY WITHOUT CONTRAST TECHNIQUE: Multidetector CT imaging of the upper left extremity was performed according to the standard protocol. RADIATION DOSE REDUCTION: This exam was performed according to the departmental dose-optimization program which includes automated exposure control, adjustment of the mA and/or kV according to patient size and/or use of iterative reconstruction technique. COMPARISON:  Same-day radiographs of the right shoulder at 2:38 p.m. FINDINGS: Bones/Joint/Cartilage Acute comminuted impacted fracture of the left proximal humerus with a transverse component at the level of the surgical neck with involvement of the greater and lesser tuberosities. There is approximately 2.2 cm of anterior displacement and 1 cm of lateral displacement of the distal segment. Fracture extends through the articular surface of the humeral head with a displaced fracture fragment perched along the  anterior glenoid rim (series 5, image 48). The glenoid is intact. No additional fracture identified. The acromioclavicular joint is anatomically aligned. Ligaments Ligaments are suboptimally evaluated by CT. Soft tissue Intramuscular edema and hematoma surrounding the comminuted proximal humeral fracture, described above. IMPRESSION: 1. Comminuted, displaced, and impacted fracture of the left proximal humerus involving the surgical neck, greater and lesser tuberosities. 2. Posterior subluxation of the humeral head relative to the glenoid. Fracture involves the articular surface of the humeral head with a displaced fragment perched along the anterior glenoid rim. 3. No additional fracture identified. Electronically Signed   By: Hart Robinsons M.D.   On: 02/22/2023 18:13   CT ANGIO HEAD NECK W WO CM  Result Date: 02/22/2023 CLINICAL DATA:  head strike loc; L eye vision loss transient EXAM: CT ANGIOGRAPHY HEAD AND NECK WITH AND WITHOUT CONTRAST CT CERVICAL SPINE WITHOUT CONTRAST TECHNIQUE: Multidetector CT imaging of the head and neck was performed using the standard protocol during bolus administration of intravenous contrast. Multiplanar CT image reconstructions and MIPs were obtained to evaluate the vascular anatomy. Carotid stenosis measurements (when applicable) are obtained utilizing NASCET criteria, using the distal internal carotid diameter as the denominator. Multidetector CT imaging of the cervical spine was performed without intravenous contrast. Multiplanar CT image reconstructions were also generated. RADIATION DOSE REDUCTION: This exam was performed  according to the departmental dose-optimization program which includes automated exposure control, adjustment of the mA and/or kV according to patient size and/or use of iterative reconstruction technique. CONTRAST:  75mL OMNIPAQUE IOHEXOL 350 MG/ML SOLN COMPARISON:  None Available. FINDINGS: CT HEAD FINDINGS Limitations: Note that the presence of  iodinated contrast material limits the ability to assess for intracranial hemorrhage. Brain: No evidence of acute infarction, hemorrhage, hydrocephalus, extra-axial collection or mass lesion/mass effect. Vascular: No hyperdense vessel or unexpected calcification. Skull: Normal. Negative for fracture or focal lesion. Sinuses/Orbits: No middle ear or mastoid effusion. Paranasal sinuses are clear. Bilateral lens replacement. Orbits are otherwise unremarkable Other: None. Review of the MIP images confirms the above findings CTA NECK FINDINGS Aortic arch: Standard branching. Imaged portion shows no evidence of aneurysm or dissection. No significant stenosis of the major arch vessel origins. Right carotid system: No evidence of dissection, stenosis (50% or greater), or occlusion. Left carotid system: No evidence of dissection, stenosis (50% or greater), or occlusion. Vertebral arteries: Codominant. No evidence of dissection, stenosis (50% or greater), or occlusion. Skeleton: Negative. Other neck: Heterogeneous and multinodular thyroid gland with a dominant nodule in the thyroid isthmus measuring up to 1.9 cm. Upper chest: Negative. Review of the MIP images confirms the above findings CTA HEAD FINDINGS Anterior circulation: No significant stenosis, proximal occlusion, aneurysm, or vascular malformation. Posterior circulation: No significant stenosis, proximal occlusion, aneurysm, or vascular malformation. Venous sinuses: As permitted by contrast timing, patent. Anatomic variants: None Review of the MIP images confirms the above findings CERVICAL SPINE FINDINGS Alignment: Grade 1 anterolisthesis of C2 on C3. Trace anterolisthesis of C7 on T1. Skull base and vertebrae: No acute fracture. No primary bone lesion or focal pathologic process. Soft tissues and spinal canal: No prevertebral fluid or swelling. No visible canal hematoma. Disc levels:  No CT evidence of high-grade spinal canal stenosis. Upper chest: Negative. Other:  None. IMPRESSION: 1. Note that the presence of iodinated contrast markedly limits the ability to assess for intracranial hemorrhage within this limitation, no evidence of an extra-axial fluid collection. 2. No emergent large vessel occlusion. 3. No acute fracture or traumatic listhesis of the cervical spine. 4. Heterogeneous and multinodular thyroid gland with a dominant nodule in the thyroid isthmus measuring up to 1.9 cm. Recommend further evaluation with a nonemergent dedicated thyroid ultrasound if not previously performed. Electronically Signed   By: Lorenza Cambridge M.D.   On: 02/22/2023 17:18   CT C-SPINE NO CHARGE  Result Date: 02/22/2023 CLINICAL DATA:  head strike loc; L eye vision loss transient EXAM: CT ANGIOGRAPHY HEAD AND NECK WITH AND WITHOUT CONTRAST CT CERVICAL SPINE WITHOUT CONTRAST TECHNIQUE: Multidetector CT imaging of the head and neck was performed using the standard protocol during bolus administration of intravenous contrast. Multiplanar CT image reconstructions and MIPs were obtained to evaluate the vascular anatomy. Carotid stenosis measurements (when applicable) are obtained utilizing NASCET criteria, using the distal internal carotid diameter as the denominator. Multidetector CT imaging of the cervical spine was performed without intravenous contrast. Multiplanar CT image reconstructions were also generated. RADIATION DOSE REDUCTION: This exam was performed according to the departmental dose-optimization program which includes automated exposure control, adjustment of the mA and/or kV according to patient size and/or use of iterative reconstruction technique. CONTRAST:  75mL OMNIPAQUE IOHEXOL 350 MG/ML SOLN COMPARISON:  None Available. FINDINGS: CT HEAD FINDINGS Limitations: Note that the presence of iodinated contrast material limits the ability to assess for intracranial hemorrhage. Brain: No evidence of acute infarction, hemorrhage, hydrocephalus, extra-axial  collection or mass  lesion/mass effect. Vascular: No hyperdense vessel or unexpected calcification. Skull: Normal. Negative for fracture or focal lesion. Sinuses/Orbits: No middle ear or mastoid effusion. Paranasal sinuses are clear. Bilateral lens replacement. Orbits are otherwise unremarkable Other: None. Review of the MIP images confirms the above findings CTA NECK FINDINGS Aortic arch: Standard branching. Imaged portion shows no evidence of aneurysm or dissection. No significant stenosis of the major arch vessel origins. Right carotid system: No evidence of dissection, stenosis (50% or greater), or occlusion. Left carotid system: No evidence of dissection, stenosis (50% or greater), or occlusion. Vertebral arteries: Codominant. No evidence of dissection, stenosis (50% or greater), or occlusion. Skeleton: Negative. Other neck: Heterogeneous and multinodular thyroid gland with a dominant nodule in the thyroid isthmus measuring up to 1.9 cm. Upper chest: Negative. Review of the MIP images confirms the above findings CTA HEAD FINDINGS Anterior circulation: No significant stenosis, proximal occlusion, aneurysm, or vascular malformation. Posterior circulation: No significant stenosis, proximal occlusion, aneurysm, or vascular malformation. Venous sinuses: As permitted by contrast timing, patent. Anatomic variants: None Review of the MIP images confirms the above findings CERVICAL SPINE FINDINGS Alignment: Grade 1 anterolisthesis of C2 on C3. Trace anterolisthesis of C7 on T1. Skull base and vertebrae: No acute fracture. No primary bone lesion or focal pathologic process. Soft tissues and spinal canal: No prevertebral fluid or swelling. No visible canal hematoma. Disc levels:  No CT evidence of high-grade spinal canal stenosis. Upper chest: Negative. Other: None. IMPRESSION: 1. Note that the presence of iodinated contrast markedly limits the ability to assess for intracranial hemorrhage within this limitation, no evidence of an  extra-axial fluid collection. 2. No emergent large vessel occlusion. 3. No acute fracture or traumatic listhesis of the cervical spine. 4. Heterogeneous and multinodular thyroid gland with a dominant nodule in the thyroid isthmus measuring up to 1.9 cm. Recommend further evaluation with a nonemergent dedicated thyroid ultrasound if not previously performed. Electronically Signed   By: Lorenza Cambridge M.D.   On: 02/22/2023 17:18   DG Shoulder Left  Result Date: 02/22/2023 CLINICAL DATA:  Shoulder pain. EXAM: LEFT SHOULDER - 2+ VIEW COMPARISON:  None Available. FINDINGS: There is comminuted and displaced fracture of the proximal left humerus with associated dislocation of the shoulder joint. No other acute fracture or dislocation. No aggressive osseous lesion. The acromioclavicular joint is normal in alignment and exhibit mild degenerative changes. No soft tissue swelling. No radiopaque foreign bodies. IMPRESSION: *Comminuted and displaced fracture of the proximal left humerus with shoulder dislocation. Electronically Signed   By: Jules Schick M.D.   On: 02/22/2023 16:36        Scheduled Meds:  amLODipine  2.5 mg Oral Daily   anastrozole  1 mg Oral Daily   irbesartan  300 mg Oral Daily   metoprolol succinate  50 mg Oral Daily   mometasone-formoterol  2 puff Inhalation BID   Continuous Infusions:        Glade Lloyd, MD [facility] 02/23/2023, 8:05 AM

## 2023-02-23 NOTE — Progress Notes (Signed)
Patient has her symbicort from home and stated she will be taking it when needed.

## 2023-02-24 DIAGNOSIS — S42202A Unspecified fracture of upper end of left humerus, initial encounter for closed fracture: Secondary | ICD-10-CM | POA: Diagnosis not present

## 2023-02-24 NOTE — Plan of Care (Signed)
  Problem: Education: Goal: Knowledge of General Education information will improve Description Including pain rating scale, medication(s)/side effects and non-pharmacologic comfort measures Outcome: Progressing   

## 2023-02-24 NOTE — Progress Notes (Signed)
PROGRESS NOTE    Sarah Underwood  AOZ:[id number] DOB: [birth year] DOA: 02/22/2023 PCP: Alysia Penna, MD   Brief Narrative:  77 y.o. female with medical history significant for hypertension, asthma, osteoarthritis, and breast cancer in clinical remission presented with left shoulder pain after a fall at home and was found to have proximal left humerus fracture with dislocation.  Orthopedic surgery has been consulted.  Assessment & Plan:   Proximal left humerus fracture after mechanical fall - Pain-control, immobilization, supportive care -Orthopedics planning for surgical intervention on Monday  Leukocytosis -Resolved  Hyponatremia -Mild.  No labs today.  Hypertension -Continue amlodipine, irbesartan and metoprolol succinate  Asthma -Not in exacerbation.  Continue current inhaled regimen  Breast cancer - Followed by Dr. Truett Perna and in clinical remission as of last office visit in April  - Continue anastrozole  Thyroid nodule -Noted incidentally on CT in ED  -Outpatient follow-up recommended    DVT prophylaxis: SCDs Code Status: Full Family Communication: Friend at bedside Disposition Plan: Status is: inpatient because: Of need for orthopedic evaluation and possible surgical intervention  Consultants: Orthopedics  Procedures: None  Antimicrobials: None   Subjective: Patient seen and examined at bedside.  Continues to have intermittent left upper extremity pain.  No fever or vomiting reported Objective: Vitals:   02/23/23 1036 02/23/23 1349 02/23/23 2025 02/24/23 0457  BP: (!) 150/69 118/65 (!) 143/69 130/66  Pulse: 70 63 62 60  Resp: 16 18 18 19   Temp: 98 F (36.7 C)  (!) 97.5 F (36.4 C) 98 F (36.7 C)  TempSrc:      SpO2: 96% 98% 97% 100%  Weight:      Height:       No intake or output data in the 24 hours ending 02/24/23 0753  Filed Weights   02/22/23 1347  Weight: 69.7 kg    Examination:  General exam: On room air currently.  No  distress. Respiratory system:   Decreased breath sounds at bases bilaterally, no wheezing Cardiovascular system: Rate mostly controlled; S1 and S2 are heard gastrointestinal system: Abdomen is slightly, soft and nontender.  Bowel is normally heard  extremities: Left upper extremity is in sling.  No clubbing or edema noted  Data Reviewed: I have personally reviewed following labs and imaging studies  CBC: Recent Labs  Lab 02/22/23 1418 02/22/23 1522 02/23/23 0727  WBC 14.0*  --  9.9  HGB 13.1 13.3 11.5*  HCT 39.8 39.0 34.1*  MCV 89.2  --  87.2  PLT 354  --  277   Basic Metabolic Panel: Recent Labs  Lab 02/22/23 1418 02/22/23 1522 02/23/23 0727  NA 131* 133* 131*  K 4.0 4.3 4.1  CL 97* 96* 99  CO2 23  --  22  GLUCOSE 140* 125* 120*  BUN 14 15 16   CREATININE 0.93 0.80 0.82  CALCIUM 9.2  --  8.6*   GFR: Estimated Creatinine Clearance: 52.5 mL/min (by C-G formula based on SCr of 0.82 mg/dL). Liver Function Tests: No results for input(s): "AST", "ALT", "ALKPHOS", "BILITOT", "PROT", "ALBUMIN" in the last 168 hours. No results for input(s): "LIPASE", "AMYLASE" in the last 168 hours. No results for input(s): "AMMONIA" in the last 168 hours. Coagulation Profile: No results for input(s): "INR", "PROTIME" in the last 168 hours. Cardiac Enzymes: No results for input(s): "CKTOTAL", "CKMB", "CKMBINDEX", "TROPONINI" in the last 168 hours. BNP (last 3 results) No results for input(s): "PROBNP" in the last 8760 hours. HbA1C: No results for input(s): "HGBA1C" in the last  72 hours. CBG: No results for input(s): "GLUCAP" in the last 168 hours. Lipid Profile: No results for input(s): "CHOL", "HDL", "LDLCALC", "TRIG", "CHOLHDL", "LDLDIRECT" in the last 72 hours. Thyroid Function Tests: No results for input(s): "TSH", "T4TOTAL", "FREET4", "T3FREE", "THYROIDAB" in the last 72 hours. Anemia Panel: No results for input(s): "VITAMINB12", "FOLATE", "FERRITIN", "TIBC", "IRON", "RETICCTPCT"  in the last 72 hours. Sepsis Labs: No results for input(s): "PROCALCITON", "LATICACIDVEN" in the last 168 hours.  Recent Results (from the past 240 hour(s))  Surgical PCR screen     Status: None   Collection Time: 02/22/23 11:29 PM   Specimen: Nasal Mucosa; Nasal Swab  Result Value Ref Range Status   MRSA, PCR NEGATIVE NEGATIVE Final   Staphylococcus aureus NEGATIVE NEGATIVE Final    Comment: (NOTE) The Xpert SA Assay (FDA approved for NASAL specimens in patients 60 years of age and older), is one component of a comprehensive surveillance program. It is not intended to diagnose infection nor to guide or monitor treatment. Performed at [facility], [address], [place], [place] [address]          Radiology Studies: DG Hand Complete Left  Result Date: 02/23/2023 CLINICAL DATA:  Left hand pain after fall. EXAM: LEFT HAND - COMPLETE 3+ VIEW COMPARISON:  None Available. FINDINGS: There is no evidence of acute fracture or dislocation. Old ulnar styloid fracture is noted. Moderate degenerative changes seen between the trapezium and scaphoid bone. Soft tissues are unremarkable. IMPRESSION: No acute abnormality seen. Electronically Signed   By: Lupita Raider M.D.   On: 02/23/2023 14:44   CT Shoulder Left Wo Contrast  Result Date: 02/22/2023 CLINICAL DATA:  Left shoulder fracture. EXAM: CT OF THE UPPER LEFT EXTREMITY WITHOUT CONTRAST TECHNIQUE: Multidetector CT imaging of the upper left extremity was performed according to the standard protocol. RADIATION DOSE REDUCTION: This exam was performed according to the departmental dose-optimization program which includes automated exposure control, adjustment of the mA and/or kV according to patient size and/or use of iterative reconstruction technique. COMPARISON:  Same-day radiographs of the right shoulder at 2:38 p.m. FINDINGS: Bones/Joint/Cartilage Acute comminuted impacted fracture of the left proximal humerus with a transverse  component at the level of the surgical neck with involvement of the greater and lesser tuberosities. There is approximately 2.2 cm of anterior displacement and 1 cm of lateral displacement of the distal segment. Fracture extends through the articular surface of the humeral head with a displaced fracture fragment perched along the anterior glenoid rim (series 5, image 48). The glenoid is intact. No additional fracture identified. The acromioclavicular joint is anatomically aligned. Ligaments Ligaments are suboptimally evaluated by CT. Soft tissue Intramuscular edema and hematoma surrounding the comminuted proximal humeral fracture, described above. IMPRESSION: 1. Comminuted, displaced, and impacted fracture of the left proximal humerus involving the surgical neck, greater and lesser tuberosities. 2. Posterior subluxation of the humeral head relative to the glenoid. Fracture involves the articular surface of the humeral head with a displaced fragment perched along the anterior glenoid rim. 3. No additional fracture identified. Electronically Signed   By: Hart Robinsons M.D.   On: 02/22/2023 18:13   CT ANGIO HEAD NECK W WO CM  Result Date: 02/22/2023 CLINICAL DATA:  head strike loc; L eye vision loss transient EXAM: CT ANGIOGRAPHY HEAD AND NECK WITH AND WITHOUT CONTRAST CT CERVICAL SPINE WITHOUT CONTRAST TECHNIQUE: Multidetector CT imaging of the head and neck was performed using the standard protocol during bolus administration of intravenous contrast. Multiplanar  CT image reconstructions and MIPs were obtained to evaluate the vascular anatomy. Carotid stenosis measurements (when applicable) are obtained utilizing NASCET criteria, using the distal internal carotid diameter as the denominator. Multidetector CT imaging of the cervical spine was performed without intravenous contrast. Multiplanar CT image reconstructions were also generated. RADIATION DOSE REDUCTION: This exam was performed according to the  departmental dose-optimization program which includes automated exposure control, adjustment of the mA and/or kV according to patient size and/or use of iterative reconstruction technique. CONTRAST:  75mL OMNIPAQUE IOHEXOL 350 MG/ML SOLN COMPARISON:  None Available. FINDINGS: CT HEAD FINDINGS Limitations: Note that the presence of iodinated contrast material limits the ability to assess for intracranial hemorrhage. Brain: No evidence of acute infarction, hemorrhage, hydrocephalus, extra-axial collection or mass lesion/mass effect. Vascular: No hyperdense vessel or unexpected calcification. Skull: Normal. Negative for fracture or focal lesion. Sinuses/Orbits: No middle ear or mastoid effusion. Paranasal sinuses are clear. Bilateral lens replacement. Orbits are otherwise unremarkable Other: None. Review of the MIP images confirms the above findings CTA NECK FINDINGS Aortic arch: Standard branching. Imaged portion shows no evidence of aneurysm or dissection. No significant stenosis of the major arch vessel origins. Right carotid system: No evidence of dissection, stenosis (50% or greater), or occlusion. Left carotid system: No evidence of dissection, stenosis (50% or greater), or occlusion. Vertebral arteries: Codominant. No evidence of dissection, stenosis (50% or greater), or occlusion. Skeleton: Negative. Other neck: Heterogeneous and multinodular thyroid gland with a dominant nodule in the thyroid isthmus measuring up to 1.9 cm. Upper chest: Negative. Review of the MIP images confirms the above findings CTA HEAD FINDINGS Anterior circulation: No significant stenosis, proximal occlusion, aneurysm, or vascular malformation. Posterior circulation: No significant stenosis, proximal occlusion, aneurysm, or vascular malformation. Venous sinuses: As permitted by contrast timing, patent. Anatomic variants: None Review of the MIP images confirms the above findings CERVICAL SPINE FINDINGS Alignment: Grade 1 anterolisthesis  of C2 on C3. Trace anterolisthesis of C7 on T1. Skull base and vertebrae: No acute fracture. No primary bone lesion or focal pathologic process. Soft tissues and spinal canal: No prevertebral fluid or swelling. No visible canal hematoma. Disc levels:  No CT evidence of high-grade spinal canal stenosis. Upper chest: Negative. Other: None. IMPRESSION: 1. Note that the presence of iodinated contrast markedly limits the ability to assess for intracranial hemorrhage within this limitation, no evidence of an extra-axial fluid collection. 2. No emergent large vessel occlusion. 3. No acute fracture or traumatic listhesis of the cervical spine. 4. Heterogeneous and multinodular thyroid gland with a dominant nodule in the thyroid isthmus measuring up to 1.9 cm. Recommend further evaluation with a nonemergent dedicated thyroid ultrasound if not previously performed. Electronically Signed   By: Lorenza Cambridge M.D.   On: 02/22/2023 17:18   CT C-SPINE NO CHARGE  Result Date: 02/22/2023 CLINICAL DATA:  head strike loc; L eye vision loss transient EXAM: CT ANGIOGRAPHY HEAD AND NECK WITH AND WITHOUT CONTRAST CT CERVICAL SPINE WITHOUT CONTRAST TECHNIQUE: Multidetector CT imaging of the head and neck was performed using the standard protocol during bolus administration of intravenous contrast. Multiplanar CT image reconstructions and MIPs were obtained to evaluate the vascular anatomy. Carotid stenosis measurements (when applicable) are obtained utilizing NASCET criteria, using the distal internal carotid diameter as the denominator. Multidetector CT imaging of the cervical spine was performed without intravenous contrast. Multiplanar CT image reconstructions were also generated. RADIATION DOSE REDUCTION: This exam was performed according to the departmental dose-optimization program which includes automated exposure control, adjustment  of the mA and/or kV according to patient size and/or use of iterative reconstruction technique.  CONTRAST:  75mL OMNIPAQUE IOHEXOL 350 MG/ML SOLN COMPARISON:  None Available. FINDINGS: CT HEAD FINDINGS Limitations: Note that the presence of iodinated contrast material limits the ability to assess for intracranial hemorrhage. Brain: No evidence of acute infarction, hemorrhage, hydrocephalus, extra-axial collection or mass lesion/mass effect. Vascular: No hyperdense vessel or unexpected calcification. Skull: Normal. Negative for fracture or focal lesion. Sinuses/Orbits: No middle ear or mastoid effusion. Paranasal sinuses are clear. Bilateral lens replacement. Orbits are otherwise unremarkable Other: None. Review of the MIP images confirms the above findings CTA NECK FINDINGS Aortic arch: Standard branching. Imaged portion shows no evidence of aneurysm or dissection. No significant stenosis of the major arch vessel origins. Right carotid system: No evidence of dissection, stenosis (50% or greater), or occlusion. Left carotid system: No evidence of dissection, stenosis (50% or greater), or occlusion. Vertebral arteries: Codominant. No evidence of dissection, stenosis (50% or greater), or occlusion. Skeleton: Negative. Other neck: Heterogeneous and multinodular thyroid gland with a dominant nodule in the thyroid isthmus measuring up to 1.9 cm. Upper chest: Negative. Review of the MIP images confirms the above findings CTA HEAD FINDINGS Anterior circulation: No significant stenosis, proximal occlusion, aneurysm, or vascular malformation. Posterior circulation: No significant stenosis, proximal occlusion, aneurysm, or vascular malformation. Venous sinuses: As permitted by contrast timing, patent. Anatomic variants: None Review of the MIP images confirms the above findings CERVICAL SPINE FINDINGS Alignment: Grade 1 anterolisthesis of C2 on C3. Trace anterolisthesis of C7 on T1. Skull base and vertebrae: No acute fracture. No primary bone lesion or focal pathologic process. Soft tissues and spinal canal: No  prevertebral fluid or swelling. No visible canal hematoma. Disc levels:  No CT evidence of high-grade spinal canal stenosis. Upper chest: Negative. Other: None. IMPRESSION: 1. Note that the presence of iodinated contrast markedly limits the ability to assess for intracranial hemorrhage within this limitation, no evidence of an extra-axial fluid collection. 2. No emergent large vessel occlusion. 3. No acute fracture or traumatic listhesis of the cervical spine. 4. Heterogeneous and multinodular thyroid gland with a dominant nodule in the thyroid isthmus measuring up to 1.9 cm. Recommend further evaluation with a nonemergent dedicated thyroid ultrasound if not previously performed. Electronically Signed   By: Lorenza Cambridge M.D.   On: 02/22/2023 17:18   DG Shoulder Left  Result Date: 02/22/2023 CLINICAL DATA:  Shoulder pain. EXAM: LEFT SHOULDER - 2+ VIEW COMPARISON:  None Available. FINDINGS: There is comminuted and displaced fracture of the proximal left humerus with associated dislocation of the shoulder joint. No other acute fracture or dislocation. No aggressive osseous lesion. The acromioclavicular joint is normal in alignment and exhibit mild degenerative changes. No soft tissue swelling. No radiopaque foreign bodies. IMPRESSION: *Comminuted and displaced fracture of the proximal left humerus with shoulder dislocation. Electronically Signed   By: Jules Schick M.D.   On: 02/22/2023 16:36        Scheduled Meds:  amLODipine  2.5 mg Oral Daily   anastrozole  1 mg Oral Daily   budesonide-formoterol  2 puff Inhalation BID   irbesartan  300 mg Oral Daily   metoprolol succinate  50 mg Oral Daily   Continuous Infusions:        Glade Lloyd, MD [facility] 02/24/2023, 7:53 AM

## 2023-02-25 DIAGNOSIS — S42202A Unspecified fracture of upper end of left humerus, initial encounter for closed fracture: Secondary | ICD-10-CM | POA: Diagnosis not present

## 2023-02-25 MED ORDER — TRANEXAMIC ACID-NACL 1000-0.7 MG/100ML-% IV SOLN
1000.0000 mg | INTRAVENOUS | Status: AC
  Administered 2023-02-26: 1000 mg via INTRAVENOUS
  Filled 2023-02-25: qty 100

## 2023-02-25 MED ORDER — CEFAZOLIN SODIUM-DEXTROSE 2-4 GM/100ML-% IV SOLN
2.0000 g | INTRAVENOUS | Status: DC
  Filled 2023-02-25: qty 100

## 2023-02-25 MED ORDER — CHLORHEXIDINE GLUCONATE 4 % EX SOLN
60.0000 mL | Freq: Once | CUTANEOUS | Status: AC
  Administered 2023-02-26: 4 via TOPICAL
  Filled 2023-02-25: qty 15

## 2023-02-25 MED ORDER — POVIDONE-IODINE 10 % EX SWAB
2.0000 | Freq: Once | CUTANEOUS | Status: AC
  Administered 2023-02-26: 2 via TOPICAL

## 2023-02-25 NOTE — Progress Notes (Signed)
PROGRESS NOTE    Sarah Underwood  PPI:[id number] DOB: [birth year] DOA: 02/22/2023 PCP: Alysia Penna, MD   Brief Narrative:  77 y.o. female with medical history significant for hypertension, asthma, osteoarthritis, and breast cancer in clinical remission presented with left shoulder pain after a fall at home and was found to have proximal left humerus fracture with dislocation.  Orthopedic surgery has been consulted.  Assessment & Plan:   Proximal left humerus fracture after mechanical fall - Pain-control, immobilization, supportive care -Orthopedics planning for surgical intervention on Monday  Leukocytosis -Resolved  Hyponatremia -Mild.  No labs today.  Hypertension -Continue amlodipine, irbesartan and metoprolol succinate  Asthma -Not in exacerbation.  Continue current inhaled regimen  Breast cancer - Followed by Dr. Truett Perna and in clinical remission as of last office visit in April  - Continue anastrozole  Thyroid nodule -Noted incidentally on CT in ED  -Outpatient follow-up recommended    DVT prophylaxis: SCDs Code Status: Full Family Communication: Friend at bedside Disposition Plan: Status is: inpatient because: Of need for orthopedic evaluation and possible surgical intervention  Consultants: Orthopedics  Procedures: None  Antimicrobials: None   Subjective: Patient seen and examined at bedside.  No chest pain, shortness breath, fever reported.  Has intermittent left upper extremity pain.   Objective: Vitals:   02/24/23 1958 02/24/23 2027 02/25/23 0442 02/25/23 0736  BP:  136/63 (!) 158/71 137/66  Pulse:  65 64 64  Resp:  16 18 19   Temp:  99.2 F (37.3 C) 98.9 F (37.2 C) 98 F (36.7 C)  TempSrc:  Oral Oral   SpO2: 98% 97% 100% 100%  Weight:      Height:       No intake or output data in the 24 hours ending 02/25/23 0807  Filed Weights   02/22/23 1347  Weight: 69.7 kg    Examination:  General exam: No acute distress.  Remains on  room air. Respiratory system:   Decreased breath sounds at bases with scattered crackles cardiovascular system: S1-S2 heard; currently rate controlled  gastrointestinal system: Abdomen is mildly distended, soft and nontender.  Bowel sounds normally heard  extremities: Left upper extremity is in sling.  No cyanosis or clubbing noted  Data Reviewed: I have personally reviewed following labs and imaging studies  CBC: Recent Labs  Lab 02/22/23 1418 02/22/23 1522 02/23/23 0727  WBC 14.0*  --  9.9  HGB 13.1 13.3 11.5*  HCT 39.8 39.0 34.1*  MCV 89.2  --  87.2  PLT 354  --  277   Basic Metabolic Panel: Recent Labs  Lab 02/22/23 1418 02/22/23 1522 02/23/23 0727  NA 131* 133* 131*  K 4.0 4.3 4.1  CL 97* 96* 99  CO2 23  --  22  GLUCOSE 140* 125* 120*  BUN 14 15 16   CREATININE 0.93 0.80 0.82  CALCIUM 9.2  --  8.6*   GFR: Estimated Creatinine Clearance: 52.5 mL/min (by C-G formula based on SCr of 0.82 mg/dL). Liver Function Tests: No results for input(s): "AST", "ALT", "ALKPHOS", "BILITOT", "PROT", "ALBUMIN" in the last 168 hours. No results for input(s): "LIPASE", "AMYLASE" in the last 168 hours. No results for input(s): "AMMONIA" in the last 168 hours. Coagulation Profile: No results for input(s): "INR", "PROTIME" in the last 168 hours. Cardiac Enzymes: No results for input(s): "CKTOTAL", "CKMB", "CKMBINDEX", "TROPONINI" in the last 168 hours. BNP (last 3 results) No results for input(s): "PROBNP" in the last 8760 hours. HbA1C: No results for input(s): "HGBA1C" in the last  72 hours. CBG: No results for input(s): "GLUCAP" in the last 168 hours. Lipid Profile: No results for input(s): "CHOL", "HDL", "LDLCALC", "TRIG", "CHOLHDL", "LDLDIRECT" in the last 72 hours. Thyroid Function Tests: No results for input(s): "TSH", "T4TOTAL", "FREET4", "T3FREE", "THYROIDAB" in the last 72 hours. Anemia Panel: No results for input(s): "VITAMINB12", "FOLATE", "FERRITIN", "TIBC", "IRON",  "RETICCTPCT" in the last 72 hours. Sepsis Labs: No results for input(s): "PROCALCITON", "LATICACIDVEN" in the last 168 hours.  Recent Results (from the past 240 hour(s))  Surgical PCR screen     Status: None   Collection Time: 02/22/23 11:29 PM   Specimen: Nasal Mucosa; Nasal Swab  Result Value Ref Range Status   MRSA, PCR NEGATIVE NEGATIVE Final   Staphylococcus aureus NEGATIVE NEGATIVE Final    Comment: (NOTE) The Xpert SA Assay (FDA approved for NASAL specimens in patients 37 years of age and older), is one component of a comprehensive surveillance program. It is not intended to diagnose infection nor to guide or monitor treatment. Performed at [facility], [address], [place], [place] [address]          Radiology Studies: DG Hand Complete Left  Result Date: 02/23/2023 CLINICAL DATA:  Left hand pain after fall. EXAM: LEFT HAND - COMPLETE 3+ VIEW COMPARISON:  None Available. FINDINGS: There is no evidence of acute fracture or dislocation. Old ulnar styloid fracture is noted. Moderate degenerative changes seen between the trapezium and scaphoid bone. Soft tissues are unremarkable. IMPRESSION: No acute abnormality seen. Electronically Signed   By: Lupita Raider M.D.   On: 02/23/2023 14:44        Scheduled Meds:  amLODipine  2.5 mg Oral Daily   anastrozole  1 mg Oral Daily   budesonide-formoterol  2 puff Inhalation BID   irbesartan  300 mg Oral Daily   metoprolol succinate  50 mg Oral Daily   Continuous Infusions:        Glade Lloyd, MD [facility] 02/25/2023, 8:07 AM

## 2023-02-25 NOTE — Plan of Care (Signed)
  Problem: Health Behavior/Discharge Planning: Goal: Ability to manage health-related needs will improve Outcome: Progressing   Problem: Activity: Goal: Risk for activity intolerance will decrease Outcome: Progressing   Problem: Pain Management: Goal: General experience of comfort will improve Outcome: Progressing

## 2023-02-25 NOTE — Plan of Care (Signed)
  Problem: Education: Goal: Knowledge of General Education information will improve Description Including pain rating scale, medication(s)/side effects and non-pharmacologic comfort measures Outcome: Progressing   

## 2023-02-26 ENCOUNTER — Encounter (HOSPITAL_COMMUNITY)
Admission: EM | Disposition: A | Discharge status: Home / Self Care | Payer: Self-pay | Source: Home / Self Care | Attending: Internal Medicine

## 2023-02-26 ENCOUNTER — Inpatient Hospital Stay (HOSPITAL_COMMUNITY): Payer: Medicare PPO | Admitting: Anesthesiology

## 2023-02-26 ENCOUNTER — Encounter (HOSPITAL_COMMUNITY): Payer: Self-pay | Admitting: Internal Medicine

## 2023-02-26 ENCOUNTER — Other Ambulatory Visit: Payer: Self-pay

## 2023-02-26 ENCOUNTER — Inpatient Hospital Stay (HOSPITAL_COMMUNITY): Payer: Medicare PPO

## 2023-02-26 DIAGNOSIS — S42202A Unspecified fracture of upper end of left humerus, initial encounter for closed fracture: Secondary | ICD-10-CM

## 2023-02-26 DIAGNOSIS — F413 Other mixed anxiety disorders: Secondary | ICD-10-CM | POA: Diagnosis not present

## 2023-02-26 HISTORY — PX: REVERSE SHOULDER ARTHROPLASTY: SHX5054

## 2023-02-26 LAB — BASIC METABOLIC PANEL
Anion gap: 12 (ref 5–15)
BUN: 19 mg/dL (ref 8–23)
CO2: 22 mmol/L (ref 22–32)
Calcium: 8.9 mg/dL (ref 8.9–10.3)
Chloride: 99 mmol/L (ref 98–111)
Creatinine, Ser: 1.05 mg/dL — ABNORMAL HIGH (ref 0.44–1.00)
GFR, Estimated: 55 mL/min — ABNORMAL LOW (ref >60)
Glucose, Bld: 118 mg/dL — ABNORMAL HIGH (ref 70–99)
Potassium: 4.6 mmol/L (ref 3.5–5.1)
Sodium: 133 mmol/L — ABNORMAL LOW (ref 135–145)

## 2023-02-26 LAB — CBC WITH DIFFERENTIAL/PLATELET
Abs Immature Granulocytes: 0.06 10*3/uL (ref 0.00–0.07)
Basophils Absolute: 0 10*3/uL (ref 0.0–0.1)
Basophils Relative: 0 %
Eosinophils Absolute: 0.1 10*3/uL (ref 0.0–0.5)
Eosinophils Relative: 1 %
HCT: 33.9 % — ABNORMAL LOW (ref 36.0–46.0)
Hemoglobin: 11.7 g/dL — ABNORMAL LOW (ref 12.0–15.0)
Immature Granulocytes: 1 %
Lymphocytes Relative: 20 %
Lymphs Abs: 2 10*3/uL (ref 0.7–4.0)
MCH: 30.8 pg (ref 26.0–34.0)
MCHC: 34.5 g/dL (ref 30.0–36.0)
MCV: 89.2 fL (ref 80.0–100.0)
Monocytes Absolute: 1.1 10*3/uL — ABNORMAL HIGH (ref 0.1–1.0)
Monocytes Relative: 11 %
Neutro Abs: 6.8 10*3/uL (ref 1.7–7.7)
Neutrophils Relative %: 67 %
Platelets: 318 10*3/uL (ref 150–400)
RBC: 3.8 MIL/uL — ABNORMAL LOW (ref 3.87–5.11)
RDW: 13.4 % (ref 11.5–15.5)
WBC: 10.2 10*3/uL (ref 4.0–10.5)
nRBC: 0 % (ref 0.0–0.2)

## 2023-02-26 LAB — MAGNESIUM: Magnesium: 2.2 mg/dL (ref 1.7–2.4)

## 2023-02-26 LAB — SURGICAL PCR SCREEN
MRSA, PCR: NEGATIVE
Staphylococcus aureus: NEGATIVE

## 2023-02-26 SURGERY — ARTHROPLASTY, SHOULDER, TOTAL, REVERSE
Anesthesia: General | Site: Shoulder | Laterality: Left

## 2023-02-26 MED ORDER — LIDOCAINE 2% (20 MG/ML) 5 ML SYRINGE
INTRAMUSCULAR | Status: AC
  Filled 2023-02-26: qty 5

## 2023-02-26 MED ORDER — FENTANYL CITRATE (PF) 100 MCG/2ML IJ SOLN
INTRAMUSCULAR | Status: AC
  Administered 2023-02-26: 50 ug via INTRAVENOUS
  Filled 2023-02-26: qty 2

## 2023-02-26 MED ORDER — VANCOMYCIN HCL IN DEXTROSE 1-5 GM/200ML-% IV SOLN
1000.0000 mg | Freq: Two times a day (BID) | INTRAVENOUS | Status: AC
  Administered 2023-02-27: 1000 mg via INTRAVENOUS
  Filled 2023-02-26: qty 200

## 2023-02-26 MED ORDER — ONDANSETRON HCL 4 MG/2ML IJ SOLN: 4.0000 mg | Freq: Once | INTRAMUSCULAR | Status: DC | PRN

## 2023-02-26 MED ORDER — ROCURONIUM BROMIDE 10 MG/ML (PF) SYRINGE
PREFILLED_SYRINGE | INTRAVENOUS | Status: AC
  Filled 2023-02-26: qty 10

## 2023-02-26 MED ORDER — CHLORHEXIDINE GLUCONATE 0.12 % MT SOLN
OROMUCOSAL | Status: AC
  Administered 2023-02-26: 15 mL via OROMUCOSAL
  Filled 2023-02-26: qty 15

## 2023-02-26 MED ORDER — TRANEXAMIC ACID-NACL 1000-0.7 MG/100ML-% IV SOLN
1000.0000 mg | Freq: Once | INTRAVENOUS | Status: AC
  Administered 2023-02-26: 1000 mg via INTRAVENOUS
  Filled 2023-02-26: qty 100

## 2023-02-26 MED ORDER — MENTHOL 3 MG MT LOZG: 1.0000 | LOZENGE | OROMUCOSAL | Status: DC | PRN

## 2023-02-26 MED ORDER — BUPIVACAINE HCL (PF) 0.5 % IJ SOLN
INTRAMUSCULAR | Status: DC | PRN
  Administered 2023-02-26: 15 mL via PERINEURAL

## 2023-02-26 MED ORDER — 0.9 % SODIUM CHLORIDE (POUR BTL) OPTIME
TOPICAL | Status: DC | PRN
  Administered 2023-02-26: 1000 mL

## 2023-02-26 MED ORDER — PHENOL 1.4 % MT LIQD
1.0000 | OROMUCOSAL | Status: DC | PRN
  Filled 2023-02-26: qty 177

## 2023-02-26 MED ORDER — PHENYLEPHRINE 80 MCG/ML (10ML) SYRINGE FOR IV PUSH (FOR BLOOD PRESSURE SUPPORT)
PREFILLED_SYRINGE | INTRAVENOUS | Status: AC
  Filled 2023-02-26: qty 10

## 2023-02-26 MED ORDER — ONDANSETRON HCL 4 MG/2ML IJ SOLN
INTRAMUSCULAR | Status: DC | PRN
  Administered 2023-02-26: 4 mg via INTRAVENOUS

## 2023-02-26 MED ORDER — OXYCODONE HCL 5 MG/5ML PO SOLN: 5.0000 mg | Freq: Once | ORAL | Status: DC | PRN

## 2023-02-26 MED ORDER — PROPOFOL 10 MG/ML IV BOLUS
INTRAVENOUS | Status: DC | PRN
  Administered 2023-02-26: 140 mg via INTRAVENOUS
  Administered 2023-02-26: 30 mg via INTRAVENOUS

## 2023-02-26 MED ORDER — ALBUMIN HUMAN 5 % IV SOLN: INTRAVENOUS | Status: DC | PRN

## 2023-02-26 MED ORDER — PROPOFOL 10 MG/ML IV BOLUS
INTRAVENOUS | Status: AC
  Filled 2023-02-26: qty 20

## 2023-02-26 MED ORDER — VANCOMYCIN HCL 1000 MG IV SOLR
INTRAVENOUS | Status: AC
  Filled 2023-02-26: qty 20

## 2023-02-26 MED ORDER — LIDOCAINE 2% (20 MG/ML) 5 ML SYRINGE
INTRAMUSCULAR | Status: DC | PRN
  Administered 2023-02-26: 40 mg via INTRAVENOUS

## 2023-02-26 MED ORDER — SUGAMMADEX SODIUM 200 MG/2ML IV SOLN
INTRAVENOUS | Status: DC | PRN
  Administered 2023-02-26: 300 mg via INTRAVENOUS

## 2023-02-26 MED ORDER — EPHEDRINE SULFATE-NACL 50-0.9 MG/10ML-% IV SOSY
PREFILLED_SYRINGE | INTRAVENOUS | Status: DC | PRN
  Administered 2023-02-26: 10 mg via INTRAVENOUS

## 2023-02-26 MED ORDER — PHENYLEPHRINE HCL-NACL 20-0.9 MG/250ML-% IV SOLN
INTRAVENOUS | Status: DC | PRN
  Administered 2023-02-26: 40 ug/min via INTRAVENOUS

## 2023-02-26 MED ORDER — ENOXAPARIN SODIUM 40 MG/0.4ML IJ SOSY
40.0000 mg | PREFILLED_SYRINGE | INTRAMUSCULAR | Status: DC
  Administered 2023-02-27: 40 mg via SUBCUTANEOUS
  Filled 2023-02-26: qty 0.4

## 2023-02-26 MED ORDER — FENTANYL CITRATE (PF) 100 MCG/2ML IJ SOLN
INTRAMUSCULAR | Status: DC | PRN
  Administered 2023-02-26 (×2): 50 ug via INTRAVENOUS

## 2023-02-26 MED ORDER — CHLORHEXIDINE GLUCONATE 0.12 % MT SOLN: 15.0000 mL | Freq: Once | OROMUCOSAL | Status: AC

## 2023-02-26 MED ORDER — DOCUSATE SODIUM 100 MG PO CAPS
100.0000 mg | ORAL_CAPSULE | Freq: Two times a day (BID) | ORAL | Status: DC
  Administered 2023-02-26 – 2023-02-27 (×2): 100 mg via ORAL
  Filled 2023-02-26 (×2): qty 1

## 2023-02-26 MED ORDER — VANCOMYCIN HCL 1000 MG IV SOLR
INTRAVENOUS | Status: DC | PRN
  Administered 2023-02-26: 1000 mg via INTRAVENOUS

## 2023-02-26 MED ORDER — LACTATED RINGERS IV SOLN: INTRAVENOUS | Status: DC

## 2023-02-26 MED ORDER — FENTANYL CITRATE (PF) 100 MCG/2ML IJ SOLN
INTRAMUSCULAR | Status: AC
  Filled 2023-02-26: qty 2

## 2023-02-26 MED ORDER — ROCURONIUM BROMIDE 10 MG/ML (PF) SYRINGE
PREFILLED_SYRINGE | INTRAVENOUS | Status: DC | PRN
  Administered 2023-02-26: 10 mg via INTRAVENOUS
  Administered 2023-02-26: 50 mg via INTRAVENOUS

## 2023-02-26 MED ORDER — VANCOMYCIN HCL IN DEXTROSE 1-5 GM/200ML-% IV SOLN
INTRAVENOUS | Status: AC
  Filled 2023-02-26: qty 200

## 2023-02-26 MED ORDER — DEXAMETHASONE SODIUM PHOSPHATE 10 MG/ML IJ SOLN
INTRAMUSCULAR | Status: AC
Start: 2023-02-26 — End: ?
  Filled 2023-02-26: qty 1

## 2023-02-26 MED ORDER — ONDANSETRON HCL 4 MG/2ML IJ SOLN
INTRAMUSCULAR | Status: AC
  Filled 2023-02-26: qty 2

## 2023-02-26 MED ORDER — BUPIVACAINE LIPOSOME 1.3 % IJ SUSP
INTRAMUSCULAR | Status: DC | PRN
  Administered 2023-02-26: 10 mL via PERINEURAL

## 2023-02-26 MED ORDER — FENTANYL CITRATE (PF) 100 MCG/2ML IJ SOLN: 50.0000 ug | Freq: Once | INTRAMUSCULAR | Status: AC

## 2023-02-26 MED ORDER — PHENYLEPHRINE 80 MCG/ML (10ML) SYRINGE FOR IV PUSH (FOR BLOOD PRESSURE SUPPORT)
PREFILLED_SYRINGE | INTRAVENOUS | Status: DC | PRN
  Administered 2023-02-26 (×3): 80 ug via INTRAVENOUS

## 2023-02-26 MED ORDER — ORAL CARE MOUTH RINSE: 15.0000 mL | Freq: Once | OROMUCOSAL | Status: AC

## 2023-02-26 MED ORDER — OXYCODONE HCL 5 MG PO TABS: 5.0000 mg | ORAL_TABLET | Freq: Once | ORAL | Status: DC | PRN

## 2023-02-26 MED ORDER — BUPIVACAINE LIPOSOME 1.3 % IJ SUSP
INTRAMUSCULAR | Status: AC
  Filled 2023-02-26: qty 10

## 2023-02-26 MED ORDER — FENTANYL CITRATE (PF) 100 MCG/2ML IJ SOLN
25.0000 ug | INTRAMUSCULAR | Status: DC | PRN
Start: 2023-02-26 — End: 2023-02-26

## 2023-02-26 MED ORDER — VANCOMYCIN HCL 1 G IV SOLR
INTRAVENOUS | Status: DC | PRN
  Administered 2023-02-26: 1000 mg

## 2023-02-26 MED ORDER — DEXAMETHASONE SODIUM PHOSPHATE 10 MG/ML IJ SOLN
INTRAMUSCULAR | Status: DC | PRN
  Administered 2023-02-26: 10 mg via INTRAVENOUS

## 2023-02-26 SURGICAL SUPPLY — 73 items
AID PSTN UNV HD RSTRNT DISP (MISCELLANEOUS) ×1
BAG COUNTER SPONGE SURGICOUNT (BAG) ×2 IMPLANT
BAG SPNG CNTER NS LX DISP (BAG) ×1
BIT DRILL 5/64X5 DISP (BIT) ×2 IMPLANT
BLADE SAG 18X100X1.27 (BLADE) ×2 IMPLANT
BSPLAT GLND +2X24 MDLR (Joint) ×1 IMPLANT
CEMENT HV SMART SET (Cement) IMPLANT
COOLER ICEMAN CLASSIC (MISCELLANEOUS) IMPLANT
COVER SURGICAL LIGHT HANDLE (MISCELLANEOUS) ×2 IMPLANT
CUP SUT UNIV REVERS 36 NEUTRAL (Cup) IMPLANT
DRAPE IMP U-DRAPE 54X76 (DRAPES) ×4 IMPLANT
DRAPE INCISE IOBAN 66X45 STRL (DRAPES) ×2 IMPLANT
DRAPE SURG 17X23 STRL (DRAPES) ×2 IMPLANT
DRAPE SURG ORHT 6 SPLT 77X108 (DRAPES) ×4 IMPLANT
DRAPE U-SHAPE 47X51 STRL (DRAPES) ×2 IMPLANT
DRESSING AQUACEL AG SP 3.5X6 (GAUZE/BANDAGES/DRESSINGS) ×2 IMPLANT
DRSG AQUACEL AG ADV 3.5X10 (GAUZE/BANDAGES/DRESSINGS) ×2 IMPLANT
DRSG AQUACEL AG SP 3.5X6 (GAUZE/BANDAGES/DRESSINGS)
DURAPREP 26ML APPLICATOR (WOUND CARE) ×2 IMPLANT
ELECT BLADE 4.0 EZ CLEAN MEGAD (MISCELLANEOUS) ×1
ELECT REM PT RETURN 9FT ADLT (ELECTROSURGICAL) ×1
ELECTRODE BLDE 4.0 EZ CLN MEGD (MISCELLANEOUS) ×2 IMPLANT
ELECTRODE REM PT RTRN 9FT ADLT (ELECTROSURGICAL) ×2 IMPLANT
FACESHIELD WRAPAROUND (MASK) ×1 IMPLANT
FACESHIELD WRAPAROUND OR TEAM (MASK) ×2 IMPLANT
GLENOID UNI REV MOD 24 +2 LAT (Joint) IMPLANT
GLENOSPHERE SYSTEM 36/24 (Shoulder) IMPLANT
GLOVE BIO SURGEON STRL SZ7.5 (GLOVE) ×4 IMPLANT
GLOVE BIOGEL PI IND STRL 8 (GLOVE) ×4 IMPLANT
GOWN STRL REUS W/ TWL LRG LVL3 (GOWN DISPOSABLE) ×2 IMPLANT
GOWN STRL REUS W/ TWL XL LVL3 (GOWN DISPOSABLE) ×4 IMPLANT
GOWN STRL REUS W/TWL LRG LVL3 (GOWN DISPOSABLE) ×1
GOWN STRL REUS W/TWL XL LVL3 (GOWN DISPOSABLE) ×2
KIT BASIN OR (CUSTOM PROCEDURE TRAY) ×2 IMPLANT
KIT TURNOVER KIT B (KITS) ×2 IMPLANT
LINER HUMERAL 36 +3MM SM (Shoulder) IMPLANT
MANIFOLD NEPTUNE II (INSTRUMENTS) ×2 IMPLANT
NDL 1/2 CIR MAYO (NEEDLE) ×2 IMPLANT
NDL HYPO 25GX1X1/2 BEV (NEEDLE) ×2 IMPLANT
NDL TAPERED W/ NITINOL LOOP (MISCELLANEOUS) IMPLANT
NEEDLE 1/2 CIR MAYO (NEEDLE) ×1 IMPLANT
NEEDLE HYPO 25GX1X1/2 BEV (NEEDLE) ×1 IMPLANT
NEEDLE TAPERED W/ NITINOL LOOP (MISCELLANEOUS) ×1 IMPLANT
NS IRRIG 1000ML POUR BTL (IV SOLUTION) ×2 IMPLANT
PACK SHOULDER (CUSTOM PROCEDURE TRAY) ×2 IMPLANT
PAD ARMBOARD 7.5X6 YLW CONV (MISCELLANEOUS) ×4 IMPLANT
PIN SET MODULAR GLENOID SYSTEM (PIN) IMPLANT
RESTRAINT HEAD UNIVERSAL NS (MISCELLANEOUS) ×2 IMPLANT
SCREW CENTRAL MODULAR 25 (Screw) IMPLANT
SCREW PERI LOCK 5.5X16 (Screw) IMPLANT
SCREW PERIPHERAL 5.5X28 LOCK (Screw) IMPLANT
SLING ARM IMMOBILIZER LRG (SOFTGOODS) IMPLANT
SLING ARM IMMOBILIZER MED (SOFTGOODS) IMPLANT
SPONGE T-LAP 18X18 ~~LOC~~+RFID (SPONGE) IMPLANT
SPONGE T-LAP 4X18 ~~LOC~~+RFID (SPONGE) ×2 IMPLANT
STEM HUMERAL UNI REVERS SZ6 (Stem) IMPLANT
STRIP CLOSURE SKIN 1/2X4 (GAUZE/BANDAGES/DRESSINGS) ×2 IMPLANT
SUCTION TUBE FRAZIER 10FR DISP (SUCTIONS) ×2 IMPLANT
SUT FIBERWIRE #2 38 T-5 BLUE (SUTURE) ×2
SUT MNCRL AB 3-0 PS2 18 (SUTURE) ×2 IMPLANT
SUT VIC AB 1 CT1 27 (SUTURE)
SUT VIC AB 1 CT1 27XBRD ANBCTR (SUTURE) IMPLANT
SUT VIC AB 2-0 CT1 27 (SUTURE)
SUT VIC AB 2-0 CT1 TAPERPNT 27 (SUTURE) ×2 IMPLANT
SUTURE FIBERWR #2 38 T-5 BLUE (SUTURE) ×2 IMPLANT
SUTURE TAPE 1.3 40 TPR END (SUTURE) IMPLANT
SUTURETAPE 1.3 40 TPR END (SUTURE) ×4
SYR CONTROL 10ML LL (SYRINGE) ×2 IMPLANT
TOWEL GREEN STERILE (TOWEL DISPOSABLE) ×2 IMPLANT
TOWEL GREEN STERILE FF (TOWEL DISPOSABLE) ×2 IMPLANT
TOWER CARTRIDGE SMART MIX (DISPOSABLE) IMPLANT
WATER STERILE IRR 1000ML POUR (IV SOLUTION) ×2 IMPLANT
YANKAUER SUCT BULB TIP NO VENT (SUCTIONS) ×2 IMPLANT

## 2023-02-26 NOTE — H&P (Signed)
H&P update  The surgical history has been reviewed and remains accurate without interval change.  The patient was re-examined and patient's physiologic condition has not changed significantly in the last 30 days. The condition still exists that makes this procedure necessary. The treatment plan remains the same, without new options for care.  No new pharmacological allergies or types of therapy has been initiated that would change the plan or the appropriateness of the plan.  The patient and/or family understand the potential benefits and risks.  Kunio Cummiskey P. Aundria Rud, MD 02/26/2023 3:20 PM

## 2023-02-26 NOTE — TOC CAGE-AID Note (Signed)
Transition of Care [facility]) - CAGE-AID Screening   Patient Details  Name: Sarah Underwood MRN: [id number] Date of Birth: 1946/03/06   Hewitt Shorts, RN Trauma Response Nurse Phone Number: [phone] 02/26/2023, 1:18 PM    CAGE-AID Screening:    Have You Ever Felt You Ought to Cut Down on Your Drinking or Drug Use?: No Have People Annoyed You By [employer] Your Drinking Or Drug Use?: No Have You Felt Bad Or Guilty About Your Drinking Or Drug Use?: No Have You Ever Had a Drink or Used Drugs First Thing In The Morning to Steady Your Nerves or to Get Rid of a Hangover?: No CAGE-AID Score: 0  Substance Abuse Education Offered: No

## 2023-02-26 NOTE — Progress Notes (Signed)
PT Cancellation Note  Patient Details Name: Sarah Underwood MRN: [id number] DOB: 15-Aug-1945   Cancelled Treatment:    Reason Eval/Treat Not Completed: Other (comment) (Surgery scheduled today for reverse total shoulder. Will follow up tomorrow after surgery.)   Gladys Damme 02/26/2023, 10:34 AM

## 2023-02-26 NOTE — Brief Op Note (Signed)
02/22/2023 - 02/26/2023  4:56 PM  PATIENT:  Sarah Underwood  77 y.o. female  PRE-OPERATIVE DIAGNOSIS:  proximal humerus fracture  POST-OPERATIVE DIAGNOSIS:  head splitting proximal humerus fracture  PROCEDURE:  Procedure(s): REVERSE SHOULDER ARTHROPLASTY (Left)  SURGEON:  Surgeons and Role:    * Yolonda Kida, MD - Primary  PHYSICIAN ASSISTANT: Dion Saucier, PA-C   ANESTHESIA:   regional and general  EBL:  75 cc  BLOOD ADMINISTERED:none  DRAINS: none   LOCAL MEDICATIONS USED:  NONE  SPECIMEN:  No Specimen  DISPOSITION OF SPECIMEN:  N/A  COUNTS:  YES  TOURNIQUET:  * No tourniquets in log *  DICTATION: .Note written in EPIC  PLAN OF CARE: Admit to inpatient   PATIENT DISPOSITION:  PACU - hemodynamically stable.   Delay start of Pharmacological VTE agent (>24hrs) due to surgical blood loss or risk of bleeding: not applicable

## 2023-02-26 NOTE — Op Note (Signed)
Date: 02/26/2023   PRE-OPERATIVE DIAGNOSIS: Left three-part proximal humerus fracture, with head split component   POST-OPERATIVE DIAGNOSIS:  Same   PROCEDURE:  1.  Left shoulder REVERSE SHOULDER ARTHROPLASTY 2.  Left shoulder open reduction internal fixation of greater tuberosity and lesser tuberosity   SURGEON:  Yolonda Kida, MD   ASSISTANT: Dion Saucier, PA-C  Assistant attestation:  PA Mcclung present for the entire procedure.   ANESTHESIA:   General with a block   ESTIMATED BLOOD LOSS: See anesthesia record   PREOPERATIVE INDICATIONS: [name] is a right-hand-dominant 77 year old female who sustained a left proximal humerus fracture following a fall.  Due to the comminution and displaced nature of the fracture and head splitting components we discussed operative management.  We discussed moving forward with reverse shoulder arthroplasty for this injury. Thus we elected to proceed with reverse shoulder arthroplasty for the plan.  The risks benefits and alternatives were discussed with the patient preoperatively including but not limited to the risks of infection, bleeding, nerve injury, cardiopulmonary complications, the need for revision surgery, dislocation, brachial plexus palsy, incomplete relief of pain, among others, and the patient was willing to proceed. The patient provided informed consent.   OPERATIVE IMPLANTS:  Arthrex size 6 universal stem with a standard tray and +3 mm polyethylene liner. Glenoid/24 mm +2 lateralized baseplate with a 25 mm central screw and 4 peripheral locking screws Size 36 mm +4 mm lateralized glenosphere   OPERATIVE FINDINGS:   Three-part proximal humerus fracture with head split component with a large piece of humeral head on the lesser tuberosity as well as on the greater tuberosity.  Rotator cuff intact x 4.  There was multiple loose bodies found within the joint consistent with pre-existing osteoarthritis and there was some  central glenoid full-thickness cartilage loss.   OPERATIVE PROCEDURE: The patient was brought to the operating room and placed in the supine position. General anesthesia was administered. IV antibiotics were given. Time out was performed. The upper extremity was prepped and draped in usual sterile fashion. The patient was in a beachchair position. Deltopectoral approach was carried out. After dissection through skin and subcutaneous fat, the cephalic vein was identified with the deltopectoral interval.  This was mobilized and taken .   The fracture was identified and working through the fracture in the biceps groove the lesser and greater tuberosities were freed up and tagged with #2 Fiber wire sutures.  The humeral head was fragmented and removed from the wound.     Next, the long head of the biceps tendon was tenotomized.   I then performed circumferential releases of the humerus.  I then moved to sizing the humerus.  There was minimal calcar bone loss and this was used to reference the height of the stem, as was the upper border of the pectoralis major tendon.  The canal was reamed and found to fit best with a size 6 stem.   We next turned to the glenoid.  Deep retractors were placed, and I resected the labrum as well as the residual long head of biceps, and then placed a guidepin into the center position on the glenoid, with slight inferior declination. I then reamed over the guidepin, and this created a small metaphyseal cancellus blush inferiorly, removing just the cartilage to the subchondral bone superiorly. The base plate was selected and screwed into place, and then I secured it centrally with a nonlocking screw, and I had excellent purchase both inferiorly and superiorly. I placed a  short locking screws on anterior aspect,  And posterior aspect.   I then turned my attention to the glenosphere, and impacted this into place.   The glenoid sphere was completely seated, and had engagement of the  [facility] taper. I then turned my attention back to the humerus.  We then placed the setscrew as backup fixation for the glenosphere    The size 6 stem was seated to the appropriate height to allow approximate 5.6 cm from the top of the Pectoralis major tendon to the top of the glenosphere.  The stem was placed in 30 degrees of retroversion.     Once the stem was impacted into place we trialed poly liners.  Using the standard humeral metaglen,  The shoulder had excellent motion, and was stable.  The final poly was impacted and again showed good motion and stability.  Next, I irrigated the wounds copiously.    The greater tuberosity was brought back to the humeral stem suture holes and secured with bone graft from the humeral head as augment.  The lesser tuberosity was likewise brought back to the humeral stem.  Bone graft was placed prior to securing the tuberosities.  The axillary nerve was palpated at the end of implanting, and found to be in continuity and not under undo tension.   I then irrigated the shoulder copiously once more, placed a gram of vancomycin powder, repaired the deltopectoral interval with Vicryl followed by subcutaneous monocryl and then subcuticular monocryl with Steri-Strips and sterile gauze for the skin. The patient was awakened and returned back in stable and satisfactory condition. There no complications and he tolerated the procedure well.  All counts were correct.  The patient awakened from general anesthesia with no complications and transferred to PACU in stable condition.   Postoperative Plan: Sarah Underwood will remain in his sling for 2 weeks with elbow, hand and wrist range of motion as tolerated but a quiet shoulder.  Beginning from week 2-4 will begin scapular retraction as well as table slides and passive elevation up to 90 degrees.  External rotation to neutral at that time.  Starting on 1 month postoperatively we will allow her to progress with range of motion active  and active assist as tolerated.  No aggressive passive range of motion until she is 2 months out.  Will see her back in the office in 2 weeks.  She will return to the hospitalist at this time.

## 2023-02-26 NOTE — Anesthesia Procedure Notes (Signed)
Anesthesia Regional Block: Interscalene brachial plexus block   Pre-Anesthetic Checklist: , timeout performed,  Correct Patient, Correct Site, Correct Laterality,  Correct Procedure, Correct Position, site marked,  Risks and benefits discussed,  Surgical consent,  Pre-op evaluation,  At surgeon's request and post-op pain management  Laterality: Left  Prep: chloraprep       Needles:  Injection technique: Single-shot  Needle Type: Echogenic Needle     Needle Length: 5cm  Needle Gauge: 21     Additional Needles:   Narrative:  Start time: 02/26/2023 2:52 PM End time: 02/26/2023 2:55 PM Injection made incrementally with aspirations every 5 mL.  Performed by: Personally  Anesthesiologist: Beryle Lathe, MD  Additional Notes: No pain on injection. No increased resistance to injection. Injection made in 5cc increments. Good needle visualization. Patient tolerated the procedure well.

## 2023-02-26 NOTE — Transfer of Care (Signed)
Immediate Anesthesia Transfer of Care Note  Patient: KINDRA BICKHAM  Procedure(s) Performed: REVERSE SHOULDER ARTHROPLASTY (Left: Shoulder)  Patient Location: PACU  Anesthesia Type:GA combined with regional for post-op pain  Level of Consciousness: drowsy and patient cooperative  Airway & Oxygen Therapy: Patient Spontanous Breathing and Patient connected to nasal cannula oxygen  Post-op Assessment: Report given to RN and Post -op Vital signs reviewed and stable  Post vital signs: Reviewed and stable  Last Vitals:  Vitals Value Taken Time  BP 136/60 02/26/23 1730  Temp 36.6 C 02/26/23 1726  Pulse 77 02/26/23 1731  Resp 13 02/26/23 1731  SpO2 100 % 02/26/23 1731  Vitals shown include unfiled device data.  Last Pain:  Vitals:   02/26/23 1349  TempSrc: Oral  PainSc:       Patients Stated Pain Goal: 2 (02/25/23 1512)  Complications: No notable events documented.

## 2023-02-26 NOTE — Anesthesia Preprocedure Evaluation (Addendum)
Anesthesia Evaluation  Patient identified by MRN, date of birth, ID band Patient awake    Reviewed: Allergy & Precautions, NPO status , Patient's Chart, lab work & pertinent test results  History of Anesthesia Complications Negative for: history of anesthetic complications  Airway Mallampati: II  TM Distance: >3 FB Neck ROM: Full    Dental  (+) Dental Advisory Given   Pulmonary asthma    Pulmonary exam normal        Cardiovascular hypertension, Pt. on medications and Pt. on home beta blockers Normal cardiovascular exam   '23 TTE - EF 65%. Doppler evidence of grade I (impaired) diastolic dysfunction, normal LAP. Left atrial cavity is mildly dilated. Mild tricuspid regurgitation.      Neuro/Psych  PSYCHIATRIC DISORDERS Anxiety Depression     BPPV     GI/Hepatic Neg liver ROS,GERD  Controlled,, Celiac disease    Endo/Other  negative endocrine ROS    Renal/GU  Renal mass      Musculoskeletal  (+) Arthritis , Osteoarthritis,    Abdominal   Peds  Hematology  (+) Blood dyscrasia, anemia   Anesthesia Other Findings   Reproductive/Obstetrics                             Anesthesia Physical Anesthesia Plan  ASA: 3  Anesthesia Plan: General   Post-op Pain Management: Tylenol PO (pre-op)* and Regional block*   Induction: Intravenous  PONV Risk Score and Plan: 3 and Treatment may vary due to age or medical condition, Ondansetron and Dexamethasone  Airway Management Planned: Oral ETT  Additional Equipment: None  Intra-op Plan:   Post-operative Plan: Extubation in OR  Informed Consent: I have reviewed the patients History and Physical, chart, labs and discussed the procedure including the risks, benefits and alternatives for the proposed anesthesia with the patient or authorized representative who has indicated his/her understanding and acceptance.     Dental advisory  given  Plan Discussed with: CRNA and Anesthesiologist  Anesthesia Plan Comments:         Anesthesia Quick Evaluation

## 2023-02-26 NOTE — Progress Notes (Signed)
PROGRESS NOTE    Sarah Underwood  AOZ:[id number] DOB: Jan 15, 1946 DOA: 02/22/2023 PCP: Alysia Penna, MD   Brief Narrative:  77 y.o. female with medical history significant for hypertension, asthma, osteoarthritis, and breast cancer in clinical remission presented with left shoulder pain after a fall at home and was found to have proximal left humerus fracture with dislocation.  Orthopedic surgery has been consulted.  Assessment & Plan:   Proximal left humerus fracture after mechanical fall - Pain-control, immobilization, supportive care -Orthopedics planning for surgical intervention today  Leukocytosis -Resolved  Hyponatremia -Mild.  No labs today.  Hypertension -Continue amlodipine, irbesartan and metoprolol succinate  Asthma -Not in exacerbation.  Continue current inhaled regimen  Breast cancer - Followed by Dr. Truett Perna and in clinical remission as of last office visit in April  - Continue anastrozole  Thyroid nodule -Noted incidentally on CT in ED  -Outpatient follow-up recommended    DVT prophylaxis: SCDs Code Status: Full Family Communication: Friend at bedside Disposition Plan: Status is: inpatient because: Of need for surgical intervention  Consultants: Orthopedics  Procedures: None  Antimicrobials: None   Subjective: Patient seen and examined at bedside.  Continues to have intermittent left upper extremity pain.  No fever or vomiting reported.   Objective: Vitals:   02/25/23 0736 02/25/23 1512 02/25/23 2007 02/26/23 0432  BP: 137/66 (!) 141/73 (!) 147/65 131/60  Pulse: 64 72 76 69  Resp: 19 18 19 17   Temp: 98 F (36.7 C) 98.4 F (36.9 C) 98.2 F (36.8 C) 98.4 F (36.9 C)  TempSrc:  Oral Oral Oral  SpO2: 100% 96% 100% 93%  Weight:      Height:       No intake or output data in the 24 hours ending 02/26/23 0744  Filed Weights   02/22/23 1347  Weight: 69.7 kg    Examination:  General exam: On room air.  No distress.   Respiratory  system:   Bilateral decreased breath sounds at bases, no wheezing cardiovascular system: Rate mostly controlled; S1 and S2 are heard gastrointestinal system: Abdomen is distended slightly; soft and nontender.  Normal bowel sounds. extremities: Left upper extremity is in sling.  No edema or cyanosis noted  Data Reviewed: I have personally reviewed following labs and imaging studies  CBC: Recent Labs  Lab 02/22/23 1418 02/22/23 1522 02/23/23 0727  WBC 14.0*  --  9.9  HGB 13.1 13.3 11.5*  HCT 39.8 39.0 34.1*  MCV 89.2  --  87.2  PLT 354  --  277   Basic Metabolic Panel: Recent Labs  Lab 02/22/23 1418 02/22/23 1522 02/23/23 0727  NA 131* 133* 131*  K 4.0 4.3 4.1  CL 97* 96* 99  CO2 23  --  22  GLUCOSE 140* 125* 120*  BUN 14 15 16   CREATININE 0.93 0.80 0.82  CALCIUM 9.2  --  8.6*   GFR: Estimated Creatinine Clearance: 52.5 mL/min (by C-G formula based on SCr of 0.82 mg/dL). Liver Function Tests: No results for input(s): "AST", "ALT", "ALKPHOS", "BILITOT", "PROT", "ALBUMIN" in the last 168 hours. No results for input(s): "LIPASE", "AMYLASE" in the last 168 hours. No results for input(s): "AMMONIA" in the last 168 hours. Coagulation Profile: No results for input(s): "INR", "PROTIME" in the last 168 hours. Cardiac Enzymes: No results for input(s): "CKTOTAL", "CKMB", "CKMBINDEX", "TROPONINI" in the last 168 hours. BNP (last 3 results) No results for input(s): "PROBNP" in the last 8760 hours. HbA1C: No results for input(s): "HGBA1C" in the last 72  hours. CBG: No results for input(s): "GLUCAP" in the last 168 hours. Lipid Profile: No results for input(s): "CHOL", "HDL", "LDLCALC", "TRIG", "CHOLHDL", "LDLDIRECT" in the last 72 hours. Thyroid Function Tests: No results for input(s): "TSH", "T4TOTAL", "FREET4", "T3FREE", "THYROIDAB" in the last 72 hours. Anemia Panel: No results for input(s): "VITAMINB12", "FOLATE", "FERRITIN", "TIBC", "IRON", "RETICCTPCT" in the last 72  hours. Sepsis Labs: No results for input(s): "PROCALCITON", "LATICACIDVEN" in the last 168 hours.  Recent Results (from the past 240 hour(s))  Surgical PCR screen     Status: None   Collection Time: 02/22/23 11:29 PM   Specimen: Nasal Mucosa; Nasal Swab  Result Value Ref Range Status   MRSA, PCR NEGATIVE NEGATIVE Final   Staphylococcus aureus NEGATIVE NEGATIVE Final    Comment: (NOTE) The Xpert SA Assay (FDA approved for NASAL specimens in patients 36 years of age and older), is one component of a comprehensive surveillance program. It is not intended to diagnose infection nor to guide or monitor treatment. Performed at [facility], [address], [place], [place] [address]   Surgical pcr screen     Status: None   Collection Time: 02/26/23  5:16 AM   Specimen: Nasal Mucosa; Nasal Swab  Result Value Ref Range Status   MRSA, PCR NEGATIVE NEGATIVE Final   Staphylococcus aureus NEGATIVE NEGATIVE Final    Comment: (NOTE) The Xpert SA Assay (FDA approved for NASAL specimens in patients 93 years of age and older), is one component of a comprehensive surveillance program. It is not intended to diagnose infection nor to guide or monitor treatment. Performed at [facility], [address], [place], [place] [address]          Radiology Studies: No results found.      Scheduled Meds:  amLODipine  2.5 mg Oral Daily   anastrozole  1 mg Oral Daily   budesonide-formoterol  2 puff Inhalation BID   irbesartan  300 mg Oral Daily   metoprolol succinate  50 mg Oral Daily   povidone-iodine  2 Application Topical Once   Continuous Infusions:   ceFAZolin (ANCEF) IV     tranexamic acid            Glade Lloyd, MD [facility] 02/26/2023, 7:44 AM

## 2023-02-26 NOTE — Care Management Important Message (Signed)
Important Message  Patient Details  Name: Sarah Underwood MRN: [id number] Date of Birth: 06-26-1945   Important Message Given:  Yes - Medicare IM     Sherilyn Banker 02/26/2023, 1:27 PM

## 2023-02-26 NOTE — Anesthesia Procedure Notes (Signed)
Procedure Name: Intubation Date/Time: 02/26/2023 3:43 PM  Performed by: Orlin Hilding, CRNAPre-anesthesia Checklist: Patient identified, Emergency Drugs available, Suction available, Patient being monitored and Timeout performed Patient Re-evaluated:Patient Re-evaluated prior to induction Oxygen Delivery Method: Circle system utilized Preoxygenation: Pre-oxygenation with 100% oxygen Induction Type: IV induction Ventilation: Mask ventilation without difficulty and Oral airway inserted - appropriate to patient size Laryngoscope Size: Mac and 3 Grade View: Grade II Tube type: Oral Tube size: 7.0 mm Number of attempts: 1 Placement Confirmation: ETT inserted through vocal cords under direct vision, positive ETCO2 and breath sounds checked- equal and bilateral Secured at: 23 cm Tube secured with: Tape Dental Injury: Teeth and Oropharynx as per pre-operative assessment

## 2023-02-27 DIAGNOSIS — S42202A Unspecified fracture of upper end of left humerus, initial encounter for closed fracture: Secondary | ICD-10-CM | POA: Diagnosis not present

## 2023-02-27 MED ORDER — POLYETHYLENE GLYCOL 3350 17 G PO PACK: 17.0000 g | PACK | Freq: Every day | ORAL | 0 refills | Status: DC | PRN

## 2023-02-27 MED ORDER — DOCUSATE SODIUM 100 MG PO CAPS: 100.0000 mg | ORAL_CAPSULE | Freq: Two times a day (BID) | ORAL | 0 refills | Status: DC

## 2023-02-27 NOTE — Progress Notes (Signed)
   Subjective:  ZYKIRIA BRUENING is a 77 y.o. female, 1 Day Post-Op    s/p Procedure(s): REVERSE SHOULDER ARTHROPLASTY   Patient reports pain as mild.  Block still active doing well, some sensation returning to fingers. OT came by.   Objective:   VITALS:   Vitals:   02/26/23 1951 02/27/23 0346 02/27/23 0727 02/27/23 0757  BP: (!) 146/67 (!) 148/71 (!) 152/64   Pulse: 86 87 86   Resp: 18 18 18    Temp: 98.5 F (36.9 C) 98.1 F (36.7 C) (!) 97.4 F (36.3 C)   TempSrc: Oral Oral Oral   SpO2: 98% 100% 98% 98%  Weight:      Height:        NAD  LUE:   Neurovascular intact Sensation intact distally Intact pulses distally Dorsiflexion/Plantar flexion intact Incision: dressing C/D/I Compartment soft Aquacell clean, sling donned. Resolving ecchymosis. Fingers starting to regain sensation, no active elbow motion secondary to block  Lab Results  Component Value Date   WBC 10.2 02/26/2023   HGB 11.7 (L) 02/26/2023   HCT 33.9 (L) 02/26/2023   MCV 89.2 02/26/2023   PLT 318 02/26/2023   BMET    Component Value Date/Time   NA 133 (L) 02/26/2023 0745   K 4.6 02/26/2023 0745   CL 99 02/26/2023 0745   CO2 22 02/26/2023 0745   GLUCOSE 118 (H) 02/26/2023 0745   BUN 19 02/26/2023 0745   CREATININE 1.05 (H) 02/26/2023 0745   CALCIUM 8.9 02/26/2023 0745   GFRNONAA 55 (L) 02/26/2023 0745     Assessment/Plan: 1 Day Post-Op   Principal Problem:   Closed fracture of proximal end of left humerus, unspecified fracture morphology, initial encounter Active Problems:   Mild persistent asthma, uncomplicated   Hypertension   Thyroid nodule   Shoulder fracture   Advance diet  Up with therapy   Dispo : discharge to home once ready  Weightbearing Status: NWB, elbow, wrist digit motion only. No shoulder motion until follow up in the office.  DVT Prophylaxis:  lovenox    Arbie Cookey 02/27/2023, 11:23 AM  Dion Saucier PA-C  Physician Assistant with Dr. Rebekah Chesterfield [facility]

## 2023-02-27 NOTE — Progress Notes (Signed)
Nursing Discharge Note   Name: Sarah Underwood MRN: [id number] DOB: December 14, 1945    Admit Date: 02/22/2023  Discharge Date: 02/27/2023   Sarah Underwood is to be discharged home per MD order.  AVS completed. Reviewed with patient and family at bedside by primary nurse, Ladona Ridgel LPN.  Highlighted copy provided for patient to take home.   Discharge Instructions     Diet - low sodium heart healthy   Complete by: As directed    Increase activity slowly   Complete by: As directed        Allergies as of 02/27/2023       Reactions   Other Swelling   Trees   Molds & Smuts Other (See Comments)   Erythromycin Nausea And Vomiting   Penicillins Hives, Other (See Comments)   Has patient had a PCN reaction causing immediate rash, facial/tongue/throat swelling, SOB or lightheadedness with hypotension: no Has patient had a PCN reaction causing severe rash involving mucus membranes or skin necrosis: no Has patient had a PCN reaction that required hospitalization no Has patient had a PCN reaction occurring within the last 10 years: no If all of the above answers are "NO", then may proceed with Cephalosporin use.   Terbinafine And Related    rash   Hydrochlorothiazide Rash   Pt states caused a sunburn type reaction    Sulfa Antibiotics Rash        Medication List     TAKE these medications    acetaminophen 650 MG CR tablet Commonly known as: TYLENOL Take 1,300 mg by mouth every 8 (eight) hours as needed for pain.   albuterol 108 (90 Base) MCG/ACT inhaler Commonly known as: VENTOLIN HFA Inhale 2 puffs into the lungs every 4 (four) hours as needed for wheezing.   amLODipine 2.5 MG tablet Commonly known as: NORVASC Take 1 tablet by mouth daily.   anastrozole 1 MG tablet Commonly known as: ARIMIDEX TAKE 1 TABLET BY MOUTH EVERY DAY   Biotin 5000 MCG Subl Take 1 tablet by mouth See admin instructions.   budesonide-formoterol 80-4.5 MCG/ACT inhaler Commonly known as:  SYMBICORT Inhale 2 puffs into the lungs 2 (two) times daily.   docusate sodium 100 MG capsule Commonly known as: COLACE Take 1 capsule (100 mg total) by mouth 2 (two) times daily.   EPINEPHrine 0.3 mg/0.3 mL Soaj injection Commonly known as: EPI-PEN See admin instructions.   ketotifen 0.025 % ophthalmic solution Commonly known as: ZADITOR Place 1 drop into both eyes daily.   MAGNESIUM CITRATE PO Take 1 tablet by mouth 2 (two) times daily.   metoprolol succinate 50 MG 24 hr tablet Commonly known as: TOPROL-XL TAKE 1 TABLET BY MOUTH EVERY DAY WITH OR IMMEDIATELY FOLLOWING A MEAL   olmesartan 40 MG tablet Commonly known as: BENICAR Take 40 mg by mouth daily.   oxyCODONE 5 MG immediate release tablet Commonly known as: Roxicodone Take 1 tablet (5 mg total) by mouth every 4 (four) hours as needed.   polyethylene glycol 17 g packet Commonly known as: MIRALAX / GLYCOLAX Take 17 g by mouth daily as needed for mild constipation.   simvastatin 20 MG tablet Commonly known as: ZOCOR Take 10 mg by mouth See admin instructions. Take half tablet by moth on Monday, Wednesday, and Fridays per patient   TEARS NATURALE OP Place 2 drops into both eyes daily as needed (For dry eyes.).   triamcinolone ointment 0.5 % Commonly known as: KENALOG Apply 1 Application topically 2 (two) times  daily. Use as needed.  Don't use for more than 5 days continuously.   VITAMIN D PO Take 4,000 Int'l Units by mouth.   Vitamin K2 100 MCG Caps Take 1 capsule by mouth daily.   ZYRTEC ALLERGY PO Take 1 tablet by mouth daily as needed (for allergies).         Discharge Instructions/ Education: Discharge instructions given to patient with verbalized understanding. Discharge education completed with patient/family including: follow up instructions, medication list, discharge activities, and limitations if indicated Patient instructed to return to Emergency Department, call 911, or call MD for any  changes in condition.  Patient escorted via wheelchair to lobby and discharged home via private automobile.

## 2023-02-27 NOTE — Discharge Summary (Signed)
Physician Discharge Summary  Sarah Underwood MVH:[id number] DOB: [birth year] DOA: 02/22/2023  PCP: Alysia Penna, MD  Admit date: 02/22/2023 Discharge date: 02/27/2023  Admitted From: Home Disposition: Home  Recommendations for Outpatient Follow-up:  Follow up with PCP in 1 week  Outpatient follow-up with orthopedics.  Wound care as per orthopedics recommendations. Follow up in ED if symptoms worsen or new appear   Home Health: Home health PT Equipment/Devices: None  Discharge Condition: Stable CODE STATUS: Full Diet recommendation: Heart healthy  Brief/Interim Summary: 77 y.o. female with medical history significant for hypertension, asthma, osteoarthritis, and breast cancer in clinical remission presented with left shoulder pain after a fall at home and was found to have proximal left humerus fracture with dislocation.  She underwent surgical intervention by orthopedics on 02/26/2023.  Subsequently, orthopedics has cleared the patient for discharge.  She will be discharged home today with outpatient follow-up with PCP and orthopedics.  Discharge Diagnoses:   Proximal left humerus fracture after mechanical fall -underwent surgical intervention by orthopedics on 02/26/2023.  Wound care/pain management/activity as per orthopedics recommendations. Subsequently, orthopedics has cleared the patient for discharge.  She will be discharged home today with outpatient follow-up with PCP and orthopedics.   Leukocytosis -Resolved   Hyponatremia -Mild.  No labs today.   Hypertension -Continue home regimen of amlodipine, metoprolol and olmesartan.  Outpatient follow-up with PCP   asthma -Not in exacerbation.  Continue current inhaled regimen   Breast cancer - Followed by Dr. Truett Perna and in clinical remission as of last office visit in April  - Continue anastrozole   Thyroid nodule -Noted incidentally on CT in ED  -Outpatient follow-up recommended    Discharge  Instructions  Discharge Instructions     Diet - low sodium heart healthy   Complete by: As directed    Increase activity slowly   Complete by: As directed       Allergies as of 02/27/2023       Reactions   Other Swelling   Trees   Molds & Smuts Other (See Comments)   Erythromycin Nausea And Vomiting   Penicillins Hives, Other (See Comments)   Has patient had a PCN reaction causing immediate rash, facial/tongue/throat swelling, SOB or lightheadedness with hypotension: no Has patient had a PCN reaction causing severe rash involving mucus membranes or skin necrosis: no Has patient had a PCN reaction that required hospitalization no Has patient had a PCN reaction occurring within the last 10 years: no If all of the above answers are "NO", then may proceed with Cephalosporin use.   Terbinafine And Related    rash   Hydrochlorothiazide Rash   Pt states caused a sunburn type reaction    Sulfa Antibiotics Rash        Medication List     TAKE these medications    acetaminophen 650 MG CR tablet Commonly known as: TYLENOL Take 1,300 mg by mouth every 8 (eight) hours as needed for pain.   albuterol 108 (90 Base) MCG/ACT inhaler Commonly known as: VENTOLIN HFA Inhale 2 puffs into the lungs every 4 (four) hours as needed for wheezing.   amLODipine 2.5 MG tablet Commonly known as: NORVASC Take 1 tablet by mouth daily.   anastrozole 1 MG tablet Commonly known as: ARIMIDEX TAKE 1 TABLET BY MOUTH EVERY DAY   Biotin 5000 MCG Subl Take 1 tablet by mouth See admin instructions.   budesonide-formoterol 80-4.5 MCG/ACT inhaler Commonly known as: SYMBICORT Inhale 2 puffs into the lungs 2 (two) times  daily.   docusate sodium 100 MG capsule Commonly known as: COLACE Take 1 capsule (100 mg total) by mouth 2 (two) times daily.   EPINEPHrine 0.3 mg/0.3 mL Soaj injection Commonly known as: EPI-PEN See admin instructions.   ketotifen 0.025 % ophthalmic solution Commonly known as:  ZADITOR Place 1 drop into both eyes daily.   MAGNESIUM CITRATE PO Take 1 tablet by mouth 2 (two) times daily.   metoprolol succinate 50 MG 24 hr tablet Commonly known as: TOPROL-XL TAKE 1 TABLET BY MOUTH EVERY DAY WITH OR IMMEDIATELY FOLLOWING A MEAL   olmesartan 40 MG tablet Commonly known as: BENICAR Take 40 mg by mouth daily.   oxyCODONE 5 MG immediate release tablet Commonly known as: Roxicodone Take 1 tablet (5 mg total) by mouth every 4 (four) hours as needed.   polyethylene glycol 17 g packet Commonly known as: MIRALAX / GLYCOLAX Take 17 g by mouth daily as needed for mild constipation.   simvastatin 20 MG tablet Commonly known as: ZOCOR Take 10 mg by mouth See admin instructions. Take half tablet by moth on Monday, Wednesday, and Fridays per patient   TEARS NATURALE OP Place 2 drops into both eyes daily as needed (For dry eyes.).   triamcinolone ointment 0.5 % Commonly known as: KENALOG Apply 1 Application topically 2 (two) times daily. Use as needed.  Don't use for more than 5 days continuously.   VITAMIN D PO Take 4,000 Int'l Units by mouth.   Vitamin K2 100 MCG Caps Take 1 capsule by mouth daily.   ZYRTEC ALLERGY PO Take 1 tablet by mouth daily as needed (for allergies).        Follow-up Information     [facility] Department at [facility]. Go to .   Specialty: Emergency Medicine Why: As needed, If symptoms worsen Contact information: [address] [place] [address] [phone]        Yolonda Kida, MD Follow up in 2 week(s).   Specialty: Orthopedic Surgery Why: For wound re-check Contact information: [address] [place] [address] [phone]         Alysia Penna, MD. Schedule an appointment as soon as possible for a visit in 1 week(s).   Specialty: Internal Medicine Contact information: [address] [place] [address] [phone]                 Allergies  Allergen Reactions   Other Swelling    Trees   Molds & Smuts Other (See Comments)   Erythromycin Nausea And Vomiting   Penicillins Hives and Other (See Comments)    Has patient had a PCN reaction causing immediate rash, facial/tongue/throat swelling, SOB or lightheadedness with hypotension: no Has patient had a PCN reaction causing severe rash involving mucus membranes or skin necrosis: no Has patient had a PCN reaction that required hospitalization no Has patient had a PCN reaction occurring within the last 10 years: no If all of the above answers are "NO", then may proceed with Cephalosporin use.    Terbinafine And Related     rash   Hydrochlorothiazide Rash    Pt states caused a sunburn type reaction    Sulfa Antibiotics Rash    Consultations: Orthopedics   Procedures/Studies: DG Shoulder Left Port  Result Date: 02/26/2023 CLINICAL DATA:  Postop. EXAM: LEFT SHOULDER COMPARISON:  Preoperative imaging FINDINGS: Reverse left shoulder arthroplasty in expected alignment. Proximal humeral fracture was present on preoperative imaging. Recent postsurgical change  includes air and edema in the joint space and soft tissues. Again seen left axillary surgical clips. IMPRESSION: Reverse left shoulder arthroplasty in expected alignment. Electronically Signed   By: Narda Rutherford M.D.   On: 02/26/2023 20:19   DG Hand Complete Left  Result Date: 02/23/2023 CLINICAL DATA:  Left hand pain after fall. EXAM: LEFT HAND - COMPLETE 3+ VIEW COMPARISON:  None Available. FINDINGS: There is no evidence of acute fracture or dislocation. Old ulnar styloid fracture is noted. Moderate degenerative changes seen between the trapezium and scaphoid bone. Soft tissues are unremarkable. IMPRESSION: No acute abnormality seen. Electronically Signed   By: Lupita Raider M.D.   On: 02/23/2023 14:44   CT Shoulder Left Wo Contrast  Result Date: 02/22/2023 CLINICAL DATA:  Left shoulder fracture.  EXAM: CT OF THE UPPER LEFT EXTREMITY WITHOUT CONTRAST TECHNIQUE: Multidetector CT imaging of the upper left extremity was performed according to the standard protocol. RADIATION DOSE REDUCTION: This exam was performed according to the departmental dose-optimization program which includes automated exposure control, adjustment of the mA and/or kV according to patient size and/or use of iterative reconstruction technique. COMPARISON:  Same-day radiographs of the right shoulder at 2:38 p.m. FINDINGS: Bones/Joint/Cartilage Acute comminuted impacted fracture of the left proximal humerus with a transverse component at the level of the surgical neck with involvement of the greater and lesser tuberosities. There is approximately 2.2 cm of anterior displacement and 1 cm of lateral displacement of the distal segment. Fracture extends through the articular surface of the humeral head with a displaced fracture fragment perched along the anterior glenoid rim (series 5, image 48). The glenoid is intact. No additional fracture identified. The acromioclavicular joint is anatomically aligned. Ligaments Ligaments are suboptimally evaluated by CT. Soft tissue Intramuscular edema and hematoma surrounding the comminuted proximal humeral fracture, described above. IMPRESSION: 1. Comminuted, displaced, and impacted fracture of the left proximal humerus involving the surgical neck, greater and lesser tuberosities. 2. Posterior subluxation of the humeral head relative to the glenoid. Fracture involves the articular surface of the humeral head with a displaced fragment perched along the anterior glenoid rim. 3. No additional fracture identified. Electronically Signed   By: Hart Robinsons M.D.   On: 02/22/2023 18:13   CT ANGIO HEAD NECK W WO CM  Result Date: 02/22/2023 CLINICAL DATA:  head strike loc; L eye vision loss transient EXAM: CT ANGIOGRAPHY HEAD AND NECK WITH AND WITHOUT CONTRAST CT CERVICAL SPINE WITHOUT CONTRAST TECHNIQUE:  Multidetector CT imaging of the head and neck was performed using the standard protocol during bolus administration of intravenous contrast. Multiplanar CT image reconstructions and MIPs were obtained to evaluate the vascular anatomy. Carotid stenosis measurements (when applicable) are obtained utilizing NASCET criteria, using the distal internal carotid diameter as the denominator. Multidetector CT imaging of the cervical spine was performed without intravenous contrast. Multiplanar CT image reconstructions were also generated. RADIATION DOSE REDUCTION: This exam was performed according to the departmental dose-optimization program which includes automated exposure control, adjustment of the mA and/or kV according to patient size and/or use of iterative reconstruction technique. CONTRAST:  75mL OMNIPAQUE IOHEXOL 350 MG/ML SOLN COMPARISON:  None Available. FINDINGS: CT HEAD FINDINGS Limitations: Note that the presence of iodinated contrast material limits the ability to assess for intracranial hemorrhage. Brain: No evidence of acute infarction, hemorrhage, hydrocephalus, extra-axial collection or mass lesion/mass effect. Vascular: No hyperdense vessel or unexpected calcification. Skull: Normal. Negative for fracture or focal lesion. Sinuses/Orbits: No middle ear or mastoid effusion. Paranasal sinuses  are clear. Bilateral lens replacement. Orbits are otherwise unremarkable Other: None. Review of the MIP images confirms the above findings CTA NECK FINDINGS Aortic arch: Standard branching. Imaged portion shows no evidence of aneurysm or dissection. No significant stenosis of the major arch vessel origins. Right carotid system: No evidence of dissection, stenosis (50% or greater), or occlusion. Left carotid system: No evidence of dissection, stenosis (50% or greater), or occlusion. Vertebral arteries: Codominant. No evidence of dissection, stenosis (50% or greater), or occlusion. Skeleton: Negative. Other neck:  Heterogeneous and multinodular thyroid gland with a dominant nodule in the thyroid isthmus measuring up to 1.9 cm. Upper chest: Negative. Review of the MIP images confirms the above findings CTA HEAD FINDINGS Anterior circulation: No significant stenosis, proximal occlusion, aneurysm, or vascular malformation. Posterior circulation: No significant stenosis, proximal occlusion, aneurysm, or vascular malformation. Venous sinuses: As permitted by contrast timing, patent. Anatomic variants: None Review of the MIP images confirms the above findings CERVICAL SPINE FINDINGS Alignment: Grade 1 anterolisthesis of C2 on C3. Trace anterolisthesis of C7 on T1. Skull base and vertebrae: No acute fracture. No primary bone lesion or focal pathologic process. Soft tissues and spinal canal: No prevertebral fluid or swelling. No visible canal hematoma. Disc levels:  No CT evidence of high-grade spinal canal stenosis. Upper chest: Negative. Other: None. IMPRESSION: 1. Note that the presence of iodinated contrast markedly limits the ability to assess for intracranial hemorrhage within this limitation, no evidence of an extra-axial fluid collection. 2. No emergent large vessel occlusion. 3. No acute fracture or traumatic listhesis of the cervical spine. 4. Heterogeneous and multinodular thyroid gland with a dominant nodule in the thyroid isthmus measuring up to 1.9 cm. Recommend further evaluation with a nonemergent dedicated thyroid ultrasound if not previously performed. Electronically Signed   By: Lorenza Cambridge M.D.   On: 02/22/2023 17:18   CT C-SPINE NO CHARGE  Result Date: 02/22/2023 CLINICAL DATA:  head strike loc; L eye vision loss transient EXAM: CT ANGIOGRAPHY HEAD AND NECK WITH AND WITHOUT CONTRAST CT CERVICAL SPINE WITHOUT CONTRAST TECHNIQUE: Multidetector CT imaging of the head and neck was performed using the standard protocol during bolus administration of intravenous contrast. Multiplanar CT image reconstructions and  MIPs were obtained to evaluate the vascular anatomy. Carotid stenosis measurements (when applicable) are obtained utilizing NASCET criteria, using the distal internal carotid diameter as the denominator. Multidetector CT imaging of the cervical spine was performed without intravenous contrast. Multiplanar CT image reconstructions were also generated. RADIATION DOSE REDUCTION: This exam was performed according to the departmental dose-optimization program which includes automated exposure control, adjustment of the mA and/or kV according to patient size and/or use of iterative reconstruction technique. CONTRAST:  75mL OMNIPAQUE IOHEXOL 350 MG/ML SOLN COMPARISON:  None Available. FINDINGS: CT HEAD FINDINGS Limitations: Note that the presence of iodinated contrast material limits the ability to assess for intracranial hemorrhage. Brain: No evidence of acute infarction, hemorrhage, hydrocephalus, extra-axial collection or mass lesion/mass effect. Vascular: No hyperdense vessel or unexpected calcification. Skull: Normal. Negative for fracture or focal lesion. Sinuses/Orbits: No middle ear or mastoid effusion. Paranasal sinuses are clear. Bilateral lens replacement. Orbits are otherwise unremarkable Other: None. Review of the MIP images confirms the above findings CTA NECK FINDINGS Aortic arch: Standard branching. Imaged portion shows no evidence of aneurysm or dissection. No significant stenosis of the major arch vessel origins. Right carotid system: No evidence of dissection, stenosis (50% or greater), or occlusion. Left carotid system: No evidence of dissection, stenosis (50% or greater),  or occlusion. Vertebral arteries: Codominant. No evidence of dissection, stenosis (50% or greater), or occlusion. Skeleton: Negative. Other neck: Heterogeneous and multinodular thyroid gland with a dominant nodule in the thyroid isthmus measuring up to 1.9 cm. Upper chest: Negative. Review of the MIP images confirms the above findings  CTA HEAD FINDINGS Anterior circulation: No significant stenosis, proximal occlusion, aneurysm, or vascular malformation. Posterior circulation: No significant stenosis, proximal occlusion, aneurysm, or vascular malformation. Venous sinuses: As permitted by contrast timing, patent. Anatomic variants: None Review of the MIP images confirms the above findings CERVICAL SPINE FINDINGS Alignment: Grade 1 anterolisthesis of C2 on C3. Trace anterolisthesis of C7 on T1. Skull base and vertebrae: No acute fracture. No primary bone lesion or focal pathologic process. Soft tissues and spinal canal: No prevertebral fluid or swelling. No visible canal hematoma. Disc levels:  No CT evidence of high-grade spinal canal stenosis. Upper chest: Negative. Other: None. IMPRESSION: 1. Note that the presence of iodinated contrast markedly limits the ability to assess for intracranial hemorrhage within this limitation, no evidence of an extra-axial fluid collection. 2. No emergent large vessel occlusion. 3. No acute fracture or traumatic listhesis of the cervical spine. 4. Heterogeneous and multinodular thyroid gland with a dominant nodule in the thyroid isthmus measuring up to 1.9 cm. Recommend further evaluation with a nonemergent dedicated thyroid ultrasound if not previously performed. Electronically Signed   By: Lorenza Cambridge M.D.   On: 02/22/2023 17:18   DG Shoulder Left  Result Date: 02/22/2023 CLINICAL DATA:  Shoulder pain. EXAM: LEFT SHOULDER - 2+ VIEW COMPARISON:  None Available. FINDINGS: There is comminuted and displaced fracture of the proximal left humerus with associated dislocation of the shoulder joint. No other acute fracture or dislocation. No aggressive osseous lesion. The acromioclavicular joint is normal in alignment and exhibit mild degenerative changes. No soft tissue swelling. No radiopaque foreign bodies. IMPRESSION: *Comminuted and displaced fracture of the proximal left humerus with shoulder dislocation.  Electronically Signed   By: Jules Schick M.D.   On: 02/22/2023 16:36      Subjective: Patient seen and examined at bedside.  No chest pain, shortness of breath, fever reported.  Has intermittent left upper extremity pain.    Discharge Exam: Vitals:   02/27/23 0727 02/27/23 0757  BP: (!) 152/64   Pulse: 86   Resp: 18   Temp: (!) 97.4 F (36.3 C)   SpO2: 98% 98%    General exam: No acute distress.  Remains on room air.   Respiratory system:   Decreased breath sounds at bases bilaterally with scattered crackles  cardiovascular system: S1-S 2 heard; rate currently controlled  gastrointestinal system: Abdomen is mildly distended; soft and nontender.  Bowel sounds normally heard extremities: No cyanosis or clubbing; left upper extremity is in a sling.      The results of significant diagnostics from this hospitalization (including imaging, microbiology, ancillary and laboratory) are listed below for reference.     Microbiology: Recent Results (from the past 240 hour(s))  Surgical PCR screen     Status: None   Collection Time: 02/22/23 11:29 PM   Specimen: Nasal Mucosa; Nasal Swab  Result Value Ref Range Status   MRSA, PCR NEGATIVE NEGATIVE Final   Staphylococcus aureus NEGATIVE NEGATIVE Final    Comment: (NOTE) The Xpert SA Assay (FDA approved for NASAL specimens in patients 79 years of age and older), is one component of a comprehensive surveillance program. It is not intended to diagnose infection nor to guide or monitor  treatment. Performed at [facility], [address] [place], [place] [address]   Surgical pcr screen     Status: None   Collection Time: 02/26/23  5:16 AM   Specimen: Nasal Mucosa; Nasal Swab  Result Value Ref Range Status   MRSA, PCR NEGATIVE NEGATIVE Final   Staphylococcus aureus NEGATIVE NEGATIVE Final    Comment: (NOTE) The Xpert SA Assay (FDA approved for NASAL specimens in patients 37 years of age and older), is one component of a  comprehensive surveillance program. It is not intended to diagnose infection nor to guide or monitor treatment. Performed at [facility], [address] [place], [place] [address]      Labs: BNP (last 3 results) No results for input(s): "BNP" in the last 8760 hours. Basic Metabolic Panel: Recent Labs  Lab 02/22/23 1418 02/22/23 1522 02/23/23 0727 02/26/23 0745  NA 131* 133* 131* 133*  K 4.0 4.3 4.1 4.6  CL 97* 96* 99 99  CO2 23  --  22 22  GLUCOSE 140* 125* 120* 118*  BUN 14 15 16 19   CREATININE 0.93 0.80 0.82 1.05*  CALCIUM 9.2  --  8.6* 8.9  MG  --   --   --  2.2   Liver Function Tests: No results for input(s): "AST", "ALT", "ALKPHOS", "BILITOT", "PROT", "ALBUMIN" in the last 168 hours. No results for input(s): "LIPASE", "AMYLASE" in the last 168 hours. No results for input(s): "AMMONIA" in the last 168 hours. CBC: Recent Labs  Lab 02/22/23 1418 02/22/23 1522 02/23/23 0727 02/26/23 0745  WBC 14.0*  --  9.9 10.2  NEUTROABS  --   --   --  6.8  HGB 13.1 13.3 11.5* 11.7*  HCT 39.8 39.0 34.1* 33.9*  MCV 89.2  --  87.2 89.2  PLT 354  --  277 318   Cardiac Enzymes: No results for input(s): "CKTOTAL", "CKMB", "CKMBINDEX", "TROPONINI" in the last 168 hours. BNP: Invalid input(s): "POCBNP" CBG: No results for input(s): "GLUCAP" in the last 168 hours. D-Dimer No results for input(s): "DDIMER" in the last 72 hours. Hgb A1c No results for input(s): "HGBA1C" in the last 72 hours. Lipid Profile No results for input(s): "CHOL", "HDL", "LDLCALC", "TRIG", "CHOLHDL", "LDLDIRECT" in the last 72 hours. Thyroid function studies No results for input(s): "TSH", "T4TOTAL", "T3FREE", "THYROIDAB" in the last 72 hours.  Invalid input(s): "FREET3" Anemia work up No results for input(s): "VITAMINB12", "FOLATE", "FERRITIN", "TIBC", "IRON", "RETICCTPCT" in the last 72 hours. Urinalysis    Component Value Date/Time   COLORURINE YELLOW 08/31/2015 1056   APPEARANCEUR CLEAR  08/31/2015 1056   LABSPEC 1.026 08/31/2015 1056   PHURINE 6.0 08/31/2015 1056   GLUCOSEU NEGATIVE 08/31/2015 1056   HGBUR NEGATIVE 08/31/2015 1056   BILIRUBINUR NEGATIVE 08/31/2015 1056   KETONESUR NEGATIVE 08/31/2015 1056   PROTEINUR NEGATIVE 08/31/2015 1056   NITRITE NEGATIVE 08/31/2015 1056   LEUKOCYTESUR TRACE (A) 08/31/2015 1056   Sepsis Labs Recent Labs  Lab 02/22/23 1418 02/23/23 0727 02/26/23 0745  WBC 14.0* 9.9 10.2   Microbiology Recent Results (from the past 240 hour(s))  Surgical PCR screen     Status: None   Collection Time: 02/22/23 11:29 PM   Specimen: Nasal Mucosa; Nasal Swab  Result Value Ref Range Status   MRSA, PCR NEGATIVE NEGATIVE Final   Staphylococcus aureus NEGATIVE NEGATIVE Final    Comment: (NOTE) The Xpert SA Assay (FDA approved for NASAL specimens in patients 32 years of age and older), is one  component of a comprehensive surveillance program. It is not intended to diagnose infection nor to guide or monitor treatment. Performed at [facility], [address], [place], [place] [address]   Surgical pcr screen     Status: None   Collection Time: 02/26/23  5:16 AM   Specimen: Nasal Mucosa; Nasal Swab  Result Value Ref Range Status   MRSA, PCR NEGATIVE NEGATIVE Final   Staphylococcus aureus NEGATIVE NEGATIVE Final    Comment: (NOTE) The Xpert SA Assay (FDA approved for NASAL specimens in patients 66 years of age and older), is one component of a comprehensive surveillance program. It is not intended to diagnose infection nor to guide or monitor treatment. Performed at [facility], [address], [place], [place] [address]      Time coordinating discharge: 35 minutes  SIGNED:   Glade Lloyd, MD  [facility] 02/27/2023, 12:48 PM

## 2023-02-27 NOTE — Progress Notes (Signed)
Occupational Therapy Re-eval  Patient Details Name: Sarah Underwood MRN: [id number] DOB: 12-05-1945 Today's Date: 02/27/2023   History of present illness 77 y.o. female who presents with left shoulder pain after fall at home.  X-rays showed a proximal humerus fx.  11/4 s/p L reverse TSA.  PMH: vertigo, celiac disease, breast cancer s/p mastectomy, HTN, B knee pain, OA, R THA.   OT comments  Patient see after L shoulder surgery.  Patient reports nerve block still intact, feeling some tingling in her hand now.  She was educated on shoulder precautions, NWB to L UE and exercises only to elbow, wrist and hand; educated on ADL compensatory techniques, positioning, sling mgmt and wear schedule, safety and recommendations. She requires total assist for sling mgmt, min to mod assist for ADLs, and supervision for transfers and bed mobility.  She reports plan to sleep in recliner at home.  Pt reports having support for ADLs in am and pm for several hours from caregiver, and intermittent family/friend assistance throughout the day.  Based on performance today, will follow acutely and recommend follow up per MD recommendations s/p shoulder surgery.       If plan is discharge home, recommend the following:  A lot of help with bathing/dressing/bathroom;Assist for transportation;Assistance with cooking/housework   Equipment Recommendations  None recommended by OT    Recommendations for Other Services      Precautions / Restrictions Precautions Precautions: Fall;Shoulder Type of Shoulder Precautions: shoulder- no shoulder ROM, but OK for hand/wrist/elbow. Sling, NWB Shoulder Interventions: Shoulder sling/immobilizer;At all times;Off for dressing/bathing/exercises Precaution Booklet Issued: Yes (comment) Required Braces or Orthoses: Sling Restrictions Weight Bearing Restrictions: Yes LUE Weight Bearing: Non weight bearing RLE Weight Bearing: Weight bearing as tolerated       Mobility Bed  Mobility Overal bed mobility: Needs Assistance Bed Mobility: Sit to Supine       Sit to supine: Supervision, HOB elevated   General bed mobility comments: pt returns to bed without assist, reports plan to sleep in recliner    Transfers Overall transfer level: Needs assistance Equipment used: None Transfers: Sit to/from Stand Sit to Stand: Supervision           General transfer comment: for safety     Balance Overall balance assessment: Needs assistance Sitting-balance support: No upper extremity supported, Feet supported Sitting balance-Leahy Scale: Good     Standing balance support: No upper extremity supported, During functional activity Standing balance-Leahy Scale: Fair                             ADL either performed or assessed with clinical judgement   ADL Overall ADL's : Needs assistance/impaired     Grooming: Minimal assistance;Sitting           Upper Body Dressing : Minimal assistance;Sitting   Lower Body Dressing: Moderate assistance;Sit to/from stand   Toilet Transfer: Supervision/safety   Toileting- Architect and Hygiene: Minimal assistance;Sit to/from stand       Functional mobility during ADLs: Supervision/safety      Extremity/Trunk Assessment Upper Extremity Assessment Upper Extremity Assessment: Right hand dominant;LUE deficits/detail LUE Deficits / Details: s/p shoulder arthroplasty, in sling.  WFL PROM elbow/wrist/hand but nerve block still intact. LUE Coordination: decreased fine motor;decreased gross motor   Lower Extremity Assessment Lower Extremity Assessment: Defer to PT evaluation        Vision       Perception     Praxis  Cognition Arousal: Alert Behavior During Therapy: WFL for tasks assessed/performed Overall Cognitive Status: Within Functional Limits for tasks assessed                                          Exercises Exercises: Shoulder, Other  exercises Shoulder Exercises Elbow Flexion: PROM, Left, 10 reps, Seated Elbow Extension: PROM, Left, 10 reps, Seated Wrist Flexion: AROM, Left, 10 reps, Seated Wrist Extension: AROM, Left, 10 reps, Seated Digit Composite Flexion: AROM, Left, 10 reps, Seated Composite Extension: AROM, Left, 10 reps, Seated Other Exercises Other Exercises: L UE AAROM supination/pronation x 10 reps    Shoulder Instructions Shoulder Instructions Donning/doffing shirt without moving shoulder: Patient able to independently direct caregiver;Minimal assistance Method for sponge bathing under operated UE: Maximal assistance;Patient able to independently direct caregiver Donning/doffing sling/immobilizer: Maximal assistance;Patient able to independently direct caregiver Correct positioning of sling/immobilizer: Patient able to independently direct caregiver;Minimal assistance ROM for elbow, wrist and digits of operated UE: Moderate assistance;Patient able to independently direct caregiver Sling wearing schedule (on at all times/off for ADL's): Supervision/safety Proper positioning of operated UE when showering: Supervision/safety Positioning of UE while sleeping: Supervision/safety     General Comments      Pertinent Vitals/ Pain       Pain Assessment Pain Assessment: Faces Faces Pain Scale: Hurts a little bit Pain Location: L arm Pain Descriptors / Indicators: Discomfort Pain Intervention(s): Limited activity within patient's tolerance, Monitored during session, Repositioned  Home Living                                          Prior Functioning/Environment              Frequency  Min 1X/week        Progress Toward Goals  OT Goals(current goals can now be found in the care plan section)  Progress towards OT goals: Progressing toward goals  Acute Rehab OT Goals Patient Stated Goal: home today OT Goal Formulation: With patient Time For Goal Achievement:  03/09/23 Potential to Achieve Goals: Good  Plan      Co-evaluation                 AM-PAC OT "6 Clicks" Daily Activity     Outcome Measure   Help from another person eating meals?: A Little Help from another person taking care of personal grooming?: A Little Help from another person toileting, which includes using toliet, bedpan, or urinal?: A Lot Help from another person bathing (including washing, rinsing, drying)?: A Lot Help from another person to put on and taking off regular upper body clothing?: A Little Help from another person to put on and taking off regular lower body clothing?: A Lot 6 Click Score: 15    End of Session Equipment Utilized During Treatment: Other (comment) (sling)  OT Visit Diagnosis: Unsteadiness on feet (R26.81);Pain Pain - Right/Left: Left Pain - part of body: Shoulder   Activity Tolerance Patient tolerated treatment well   Patient Left in bed;with call bell/phone within reach   Nurse Communication Mobility status        Time: 1610-9604 OT Time Calculation (min): 38 min  Charges: OT General Charges $OT Visit: 1 Visit OT Evaluation $OT Re-eval: 1 Re-eval OT Treatments $Self Care/Home Management : 8-22 mins  Lorene Dy  B, OT Acute Rehabilitation Services Office [phone]   Chancy Milroy 02/27/2023, 10:45 AM

## 2023-02-27 NOTE — Progress Notes (Signed)
PROGRESS NOTE    Sarah Underwood  WJX:[id number] DOB: 12/23/1945 DOA: 02/22/2023 PCP: Alysia Penna, MD   Brief Narrative:  77 y.o. female with medical history significant for hypertension, asthma, osteoarthritis, and breast cancer in clinical remission presented with left shoulder pain after a fall at home and was found to have proximal left humerus fracture with dislocation.  She underwent surgical intervention by orthopedics on 02/26/2023.  Assessment & Plan:   Proximal left humerus fracture after mechanical fall -underwent surgical intervention by orthopedics on 02/26/2023.  Wound care/pain management/activity as per orthopedics recommendations.  PT eval.  Leukocytosis -Resolved  Hyponatremia -Mild.  No labs today.  Hypertension -Continue amlodipine, irbesartan and metoprolol succinate  Asthma -Not in exacerbation.  Continue current inhaled regimen  Breast cancer - Followed by Dr. Truett Perna and in clinical remission as of last office visit in April  - Continue anastrozole  Thyroid nodule -Noted incidentally on CT in ED  -Outpatient follow-up recommended    DVT prophylaxis: SCDs Code Status: Full Family Communication: None at bedside Disposition Plan: Status is: inpatient because: Of severity of illness Consultants: Orthopedics  Procedures: As above  Antimicrobials: Perioperative   Subjective: Patient seen and examined at bedside.  No chest pain, shortness of breath, fever reported.  Has intermittent left upper extremity pain.   Objective: Vitals:   02/26/23 1825 02/26/23 1951 02/27/23 0346 02/27/23 0727  BP: (!) 164/87 (!) 146/67 (!) 148/71 (!) 152/64  Pulse: 67 86 87 86  Resp: 18 18 18 18   Temp: 97.6 F (36.4 C) 98.5 F (36.9 C) 98.1 F (36.7 C) (!) 97.4 F (36.3 C)  TempSrc: Oral Oral Oral Oral  SpO2: 99% 98% 100% 98%  Weight:      Height:        Intake/Output Summary (Last 24 hours) at 02/27/2023 0742 Last data filed at 02/27/2023 0458 Gross  per 24 hour  Intake 1637.76 ml  Output 525 ml  Net 1112.76 ml    Filed Weights   02/22/23 1347  Weight: 69.7 kg    Examination:  General exam: No acute distress.  Remains on room air.   Respiratory system:   Decreased breath sounds at bases bilaterally with scattered crackles  cardiovascular system: S1-S 2 heard; rate currently controlled  gastrointestinal system: Abdomen is mildly distended; soft and nontender.  Bowel sounds normally heard extremities: No cyanosis or clubbing; left upper extremity is in a sling.    Data Reviewed: I have personally reviewed following labs and imaging studies  CBC: Recent Labs  Lab 02/22/23 1418 02/22/23 1522 02/23/23 0727 02/26/23 0745  WBC 14.0*  --  9.9 10.2  NEUTROABS  --   --   --  6.8  HGB 13.1 13.3 11.5* 11.7*  HCT 39.8 39.0 34.1* 33.9*  MCV 89.2  --  87.2 89.2  PLT 354  --  277 318   Basic Metabolic Panel: Recent Labs  Lab 02/22/23 1418 02/22/23 1522 02/23/23 0727 02/26/23 0745  NA 131* 133* 131* 133*  K 4.0 4.3 4.1 4.6  CL 97* 96* 99 99  CO2 23  --  22 22  GLUCOSE 140* 125* 120* 118*  BUN 14 15 16 19   CREATININE 0.93 0.80 0.82 1.05*  CALCIUM 9.2  --  8.6* 8.9  MG  --   --   --  2.2   GFR: Estimated Creatinine Clearance: 41 mL/min (A) (by C-G formula based on SCr of 1.05 mg/dL (H)). Liver Function Tests: No results for input(s): "AST", "ALT", "  ALKPHOS", "BILITOT", "PROT", "ALBUMIN" in the last 168 hours. No results for input(s): "LIPASE", "AMYLASE" in the last 168 hours. No results for input(s): "AMMONIA" in the last 168 hours. Coagulation Profile: No results for input(s): "INR", "PROTIME" in the last 168 hours. Cardiac Enzymes: No results for input(s): "CKTOTAL", "CKMB", "CKMBINDEX", "TROPONINI" in the last 168 hours. BNP (last 3 results) No results for input(s): "PROBNP" in the last 8760 hours. HbA1C: No results for input(s): "HGBA1C" in the last 72 hours. CBG: No results for input(s): "GLUCAP" in the last  168 hours. Lipid Profile: No results for input(s): "CHOL", "HDL", "LDLCALC", "TRIG", "CHOLHDL", "LDLDIRECT" in the last 72 hours. Thyroid Function Tests: No results for input(s): "TSH", "T4TOTAL", "FREET4", "T3FREE", "THYROIDAB" in the last 72 hours. Anemia Panel: No results for input(s): "VITAMINB12", "FOLATE", "FERRITIN", "TIBC", "IRON", "RETICCTPCT" in the last 72 hours. Sepsis Labs: No results for input(s): "PROCALCITON", "LATICACIDVEN" in the last 168 hours.  Recent Results (from the past 240 hour(s))  Surgical PCR screen     Status: None   Collection Time: 02/22/23 11:29 PM   Specimen: Nasal Mucosa; Nasal Swab  Result Value Ref Range Status   MRSA, PCR NEGATIVE NEGATIVE Final   Staphylococcus aureus NEGATIVE NEGATIVE Final    Comment: (NOTE) The Xpert SA Assay (FDA approved for NASAL specimens in patients 39 years of age and older), is one component of a comprehensive surveillance program. It is not intended to diagnose infection nor to guide or monitor treatment. Performed at [facility], [address] [place], [place] [address]   Surgical pcr screen     Status: None   Collection Time: 02/26/23  5:16 AM   Specimen: Nasal Mucosa; Nasal Swab  Result Value Ref Range Status   MRSA, PCR NEGATIVE NEGATIVE Final   Staphylococcus aureus NEGATIVE NEGATIVE Final    Comment: (NOTE) The Xpert SA Assay (FDA approved for NASAL specimens in patients 46 years of age and older), is one component of a comprehensive surveillance program. It is not intended to diagnose infection nor to guide or monitor treatment. Performed at [facility], [address], [place], [place] [address]          Radiology Studies: DG Shoulder Left Port  Result Date: 02/26/2023 CLINICAL DATA:  Postop. EXAM: LEFT SHOULDER COMPARISON:  Preoperative imaging FINDINGS: Reverse left shoulder arthroplasty in expected alignment. Proximal humeral fracture was present on preoperative imaging. Recent  postsurgical change includes air and edema in the joint space and soft tissues. Again seen left axillary surgical clips. IMPRESSION: Reverse left shoulder arthroplasty in expected alignment. Electronically Signed   By: Narda Rutherford M.D.   On: 02/26/2023 20:19        Scheduled Meds:  amLODipine  2.5 mg Oral Daily   anastrozole  1 mg Oral Daily   budesonide-formoterol  2 puff Inhalation BID   docusate sodium  100 mg Oral BID   enoxaparin (LOVENOX) injection  40 mg Subcutaneous Q24H   irbesartan  300 mg Oral Daily   metoprolol succinate  50 mg Oral Daily   Continuous Infusions:          Glade Lloyd, MD [facility] 02/27/2023, 7:42 AM

## 2023-02-27 NOTE — Progress Notes (Signed)
Physical Therapy Treatment Patient Details Name: QIANNA CLAGETT MRN: [id number] DOB: 11/25/1945 Today's Date: 02/27/2023   History of Present Illness 77 y.o. female who presents with left shoulder pain after fall at home.  X-rays showed a proximal humerus fx.  11/4 s/p L reverse TSA.  PMH: vertigo, celiac disease, breast cancer s/p mastectomy, HTN, B knee pain, OA, R THA.    PT Comments  Pt tolerated treatment well today.  Pt able to ambulate in hallway and navigate stairs with supervision. Pt mobility is adequate for discharge home. No change in DC/DME recs at this time. PT will continue to follow.    If plan is discharge home, recommend the following: A little help with walking and/or transfers;A little help with bathing/dressing/bathroom   Can travel by private vehicle        Equipment Recommendations  None recommended by PT    Recommendations for Other Services       Precautions / Restrictions Precautions Precautions: Fall;Shoulder Type of Shoulder Precautions: shoulder- no shoulder ROM, but OK for hand/wrist/elbow. Sling, NWB Shoulder Interventions: Shoulder sling/immobilizer;At all times;Off for dressing/bathing/exercises Precaution Booklet Issued: Yes (comment) Required Braces or Orthoses: Sling Restrictions Weight Bearing Restrictions: Yes LUE Weight Bearing: Non weight bearing RLE Weight Bearing: Weight bearing as tolerated     Mobility  Bed Mobility Overal bed mobility: Needs Assistance Bed Mobility: Sit to Supine       Sit to supine: Supervision, HOB elevated   General bed mobility comments: pt returns to bed without assist, reports plan to sleep in recliner    Transfers Overall transfer level: Needs assistance Equipment used: None Transfers: Sit to/from Stand Sit to Stand: Supervision           General transfer comment: for safety    Ambulation/Gait Ambulation/Gait assistance: Supervision Gait Distance (Feet): 500 Feet Assistive device:  None Gait Pattern/deviations: Step-through pattern, Decreased stride length Gait velocity: decr     General Gait Details: no LOB noted.   Stairs Stairs: Yes Stairs assistance: Supervision Stair Management: One rail Right, With cane, Alternating pattern, Forwards Number of Stairs: 10 General stair comments: no LOB noted. Pt used rail going up and cane going down.   Wheelchair Mobility     Tilt Bed    Modified Rankin (Stroke Patients Only)       Balance Overall balance assessment: Needs assistance Sitting-balance support: No upper extremity supported, Feet supported Sitting balance-Leahy Scale: Good     Standing balance support: No upper extremity supported, During functional activity Standing balance-Leahy Scale: Fair                              Cognition Arousal: Alert Behavior During Therapy: WFL for tasks assessed/performed Overall Cognitive Status: Within Functional Limits for tasks assessed                                          Exercises      General Comments        Pertinent Vitals/Pain Pain Assessment Pain Assessment: Faces Faces Pain Scale: Hurts a little bit Pain Location: L arm Pain Descriptors / Indicators: Discomfort Pain Intervention(s): Monitored during session, Premedicated before session    Home Living  Prior Function            PT Goals (current goals can now be found in the care plan section) Progress towards PT goals: Progressing toward goals    Frequency    Min 1X/week      PT Plan      Co-evaluation              AM-PAC PT "6 Clicks" Mobility   Outcome Measure  Help needed turning from your back to your side while in a flat bed without using bedrails?: A Little Help needed moving from lying on your back to sitting on the side of a flat bed without using bedrails?: A Little Help needed moving to and from a bed to a chair (including a  wheelchair)?: A Little Help needed standing up from a chair using your arms (e.g., wheelchair or bedside chair)?: A Little Help needed to walk in hospital room?: A Little Help needed climbing 3-5 steps with a railing? : A Little 6 Click Score: 18    End of Session Equipment Utilized During Treatment: Other (comment) (Sling) Activity Tolerance: Patient tolerated treatment well Patient left: in bed;with call bell/phone within reach;with family/visitor present Nurse Communication: Mobility status PT Visit Diagnosis: Other abnormalities of gait and mobility (R26.89);Muscle weakness (generalized) (M62.81)     Time: 4098-1191 PT Time Calculation (min) (ACUTE ONLY): 30 min  Charges:    $Gait Training: 8-22 mins $Therapeutic Activity: 8-22 mins PT General Charges $$ ACUTE PT VISIT: 1 Visit                     Shela Nevin, PT, DPT Acute Rehab Services [phone]    Gladys Damme 02/27/2023, 2:58 PM

## 2023-02-27 NOTE — Plan of Care (Signed)
  Problem: Education: Goal: Knowledge of General Education information will improve Description: Including pain rating scale, medication(s)/side effects and non-pharmacologic comfort measures Outcome: Progressing   Problem: Activity: Goal: Risk for activity intolerance will decrease Outcome: Progressing   Problem: Pain Management: Goal: General experience of comfort will improve Outcome: Progressing

## 2023-02-28 ENCOUNTER — Encounter (HOSPITAL_COMMUNITY): Payer: Self-pay | Admitting: Orthopedic Surgery

## 2023-02-28 ENCOUNTER — Encounter: Payer: Self-pay | Admitting: *Deleted

## 2023-02-28 NOTE — Anesthesia Postprocedure Evaluation (Signed)
Anesthesia Post Note  Patient: Sarah Underwood  Procedure(s) Performed: REVERSE SHOULDER ARTHROPLASTY (Left: Shoulder)     Patient location during evaluation: PACU Anesthesia Type: General and Regional Level of consciousness: awake Pain management: pain level controlled Vital Signs Assessment: post-procedure vital signs reviewed and stable Respiratory status: spontaneous breathing, nonlabored ventilation and respiratory function stable Cardiovascular status: blood pressure returned to baseline and stable Postop Assessment: no apparent nausea or vomiting Anesthetic complications: no   No notable events documented.  Last Vitals:  Vitals:   02/27/23 0727 02/27/23 0757  BP: (!) 152/64   Pulse: 86   Resp: 18   Temp: (!) 36.3 C   SpO2: 98% 98%    Last Pain:  Vitals:   02/27/23 0853  TempSrc:   PainSc: 3                  Sharis Keeran P Zarek Relph

## 2023-03-02 ENCOUNTER — Encounter: Payer: Self-pay | Admitting: *Deleted

## 2023-03-06 DIAGNOSIS — M170 Bilateral primary osteoarthritis of knee: Secondary | ICD-10-CM | POA: Diagnosis not present

## 2023-03-13 ENCOUNTER — Telehealth: Payer: Self-pay | Admitting: *Deleted

## 2023-03-13 DIAGNOSIS — Z4789 Encounter for other orthopedic aftercare: Secondary | ICD-10-CM | POA: Diagnosis not present

## 2023-03-13 DIAGNOSIS — I890 Lymphedema, not elsewhere classified: Secondary | ICD-10-CM

## 2023-03-13 DIAGNOSIS — Z170 Estrogen receptor positive status [ER+]: Secondary | ICD-10-CM

## 2023-03-13 NOTE — Telephone Encounter (Signed)
Recent fall w/fracture left arm. Surgery 11/04 with reverse shoulder arthroplasty. This is her mastectomy side and is now developing significant lymphedema. Ortho instructed her to call oncology. Per Dr. Truett Perna: Needs referral to lymphedema clinic. Patient is in full agreement. Referral placed and left VM for clinic to call RN if there is any problem with how the referral was placed.Sarah Underwood

## 2023-03-14 ENCOUNTER — Telehealth: Payer: Self-pay | Admitting: Cardiology

## 2023-03-14 DIAGNOSIS — I700 Atherosclerosis of aorta: Secondary | ICD-10-CM

## 2023-03-14 DIAGNOSIS — E7800 Pure hypercholesterolemia, unspecified: Secondary | ICD-10-CM

## 2023-03-14 NOTE — Telephone Encounter (Signed)
*  STAT* If patient is at the pharmacy, call can be transferred to refill team.   1. Which medications need to be refilled? (please list name of each medication and dose if known)   simvastatin (ZOCOR) 20 MG tablet   2. Would you like to learn more about the convenience, safety, & potential cost savings by using the [facility]?   3. Are you open to using the [facility] (Type [facility]. ).  4. Which pharmacy/location (including street and city if local pharmacy) is medication to be sent to?  [facility] [phone] [facility] - [place], [place] - [address]   5. Do they need a 30 day or 90 day supply?   90 day  Patient stated she is running out of this medication and will need a new prescription sent to her pharmacy.

## 2023-03-15 ENCOUNTER — Other Ambulatory Visit: Payer: Self-pay

## 2023-03-15 ENCOUNTER — Ambulatory Visit: Payer: Medicare PPO | Attending: Oncology | Admitting: Rehabilitation

## 2023-03-15 ENCOUNTER — Encounter: Payer: Self-pay | Admitting: Rehabilitation

## 2023-03-15 DIAGNOSIS — Z4789 Encounter for other orthopedic aftercare: Secondary | ICD-10-CM | POA: Insufficient documentation

## 2023-03-15 DIAGNOSIS — I972 Postmastectomy lymphedema syndrome: Secondary | ICD-10-CM | POA: Insufficient documentation

## 2023-03-15 DIAGNOSIS — M170 Bilateral primary osteoarthritis of knee: Secondary | ICD-10-CM | POA: Diagnosis not present

## 2023-03-15 DIAGNOSIS — C50511 Malignant neoplasm of lower-outer quadrant of right female breast: Secondary | ICD-10-CM | POA: Diagnosis not present

## 2023-03-15 DIAGNOSIS — Z170 Estrogen receptor positive status [ER+]: Secondary | ICD-10-CM | POA: Insufficient documentation

## 2023-03-15 DIAGNOSIS — Z96612 Presence of left artificial shoulder joint: Secondary | ICD-10-CM

## 2023-03-15 MED ORDER — SIMVASTATIN 20 MG PO TABS: 10.0000 mg | ORAL_TABLET | ORAL | 0 refills | Status: DC

## 2023-03-15 NOTE — Therapy (Signed)
OUTPATIENT PT UPPER EXTREMITY LYMPHEDEMA EVALUATION  Patient Name: Sarah Underwood MRN: [id number] DOB:11/01/1945, 77 y.o., female Today's Date: 03/15/2023  END OF SESSION:  PT End of Session - 03/15/23 1715     Visit Number 1    Number of Visits 25    Date for PT Re-Evaluation 05/10/23    Authorization Type [employer] Auth needed    PT Start Time 1000    PT Stop Time 1103    PT Time Calculation (min) 63 min    Activity Tolerance Patient tolerated treatment well    Behavior During Therapy WFL for tasks assessed/performed             Past Medical History:  Diagnosis Date   Basal cell carcinoma 1994   right side of node   Benign positional vertigo    with rolling over    Celiac disease    Dysrhythmia    Generalized anxiety disorder    GERD (gastroesophageal reflux disease)    Graves disease 2007   thyroid nodule   Hard of hearing    Heart murmur    History of breast cancer 1995   left - treated with mastectomy   History of radiation therapy 2001   37 sessions following recurrence of breast cancer in left chest wall   Hypertension    Knee pain, bilateral 12/14/2014   Major depressive disorder in remission    Mild persistent asthma, uncomplicated 06/29/2015   Nocturia    OA (osteoarthritis) of hip 09/08/2015   Ovarian mass    small solid nodule 8x70mm: Right ovary - stable since 2000, follow u/s do not show this.   Renal mass 2021   followed by Dr. Berneice Heinrich, [facility].  Having every 6 month follow up   Seasonal allergies    Stress fracture of metatarsal bone of left foot 12/15/2019   Vegetarian diet    Past Surgical History:  Procedure Laterality Date   APPENDECTOMY     basal cell removal  7/94   right nose   BREAST BIOPSY Right 1987   benign fibroadenoma   breast cancer recurrence Left 01/2000   at site of left chest wall after previous mastectomy with removal and treatment with radiaton.   CARPAL TUNNEL RELEASE Right 05/08/2013   Procedure: RIGHT CARPAL TUNNEL  RELEASE;  Surgeon: Wyn Forster., MD;  Location: [facility];  Service: Orthopedics;  Laterality: Right;   CATARACT EXTRACTION     03/20/14 left eye, 12/15 right eye   COLPOSCOPY W/ BIOPSY / CURETTAGE  03/12/12   benign, but + HR HPV   EYE SURGERY     bilat cataract surgery    MASTECTOMY Left 3/95   Stage IIb 0/14 LN ER/ PR + with mastectomy - Dr. Mardella Layman   MOHS SURGERY     right nare   OVARY SURGERY  1974   lt   REVERSE SHOULDER ARTHROPLASTY Left 02/26/2023   Procedure: REVERSE SHOULDER ARTHROPLASTY;  Surgeon: Yolonda Kida, MD;  Location: [facility] OR;  Service: Orthopedics;  Laterality: Left;   TONSILLECTOMY AND ADENOIDECTOMY  1950   TOTAL HIP ARTHROPLASTY Right 09/08/2015   Procedure: TOTAL HIP ARTHROPLASTY ANTERIOR APPROACH;  Surgeon: Ollen Gross, MD;  Location: [facility];  Service: Orthopedics;  Laterality: Right;   Patient Active Problem List   Diagnosis Date Noted   Shoulder fracture 02/23/2023   Closed fracture of proximal end of left humerus, unspecified fracture morphology, initial encounter 02/22/2023   Thyroid nodule 02/22/2023  Major depressive disorder in remission 03/29/2020   Generalized anxiety disorder    Benign positional vertigo    Hypertension    Heart murmur    Stress fracture of metatarsal bone of left foot 12/15/2019   History of revision of total replacement of right hip joint 11/26/2017   Leg edema, left 09/26/2016   Post-nasal drainage 03/30/2016   OA (osteoarthritis) of hip 09/08/2015   Mild persistent asthma, uncomplicated 06/29/2015   Hip pain, bilateral 12/14/2014   Knee pain, bilateral 12/14/2014   Postmenopausal atrophic vaginitis 02/23/2013   Graves disease 2007   History of radiation therapy 2001   History of breast cancer 1995   Basal cell carcinoma 1994    PCP: Alysia Penna, MD  REFERRING PROVIDER: Thornton Papas, MD  REFERRING DIAG:  Diagnosis  C50.511,Z17.0 (ICD-10-CM) - Malignant neoplasm of lower-outer  quadrant of right breast of female, estrogen receptor positive (HCC)  I89.0 (ICD-10-CM) - Lymphedema    THERAPY DIAG:  Postmastectomy lymphedema  S/P reverse total shoulder arthroplasty, left  ONSET DATE: 02/26/23  Rationale for Evaluation and Treatment: Rehabilitation  SUBJECTIVE                                                                                                                                                                                           SUBJECTIVE STATEMENT: I am starting limited exercises which are only wrist and elbow movements.  I don't have to wear my sling during the day now anymore.   PERTINENT HISTORY: Recent fall with left arm fracture.  Surgery with Reverse TSR 02/26/23.  This is the same side as her mastectomy which was diagnosed in 1995 with chest wall recurrence in 2001.  No remaining lymph nodes.   Shoulder surgery: Dr. Duwayne Heck -See media section for post-op approved exercises and okay for compression therapy.    PAIN:  Are you having pain? No Pt denies any real pain and calls it more of a burning  PRECAUTIONS: Can start AROM/AAROM at 03/27/23 and PROM at 2 months (04/23/23), use sling at night until 03/27/23  RED FLAGS: None   WEIGHT BEARING RESTRICTIONS: No  FALLS:  Has patient fallen in last 6 months? Yes. Number of falls 1 - does not remember details of her fall. Just remembers she was going outside.   LIVING ENVIRONMENT: Lives with: lives alone Has a CNA for assistance at home.   OCCUPATION:   LEISURE:   HAND DOMINANCE :   PRIOR LEVEL OF FUNCTION:   PATIENT GOALS: prevent chronic lymphedema after this fall   OBJECTIVE Note: Objective measures were completed at Evaluation unless otherwise noted.  COGNITION:  Overall cognitive status: Within functional limits for tasks assessed   PALPATION: +2 pitting edema back of hand, forearm, below, to around the distal 1/3rd of the upper arm.   OBSERVATIONS / OTHER ASSESSMENTS:  bruising all fingers and hand, elbow, and upper arm from surgery.  Skin tight and shiny from edema.     POSTURE: wearing sling upon arrival and leaving  UPPER EXTREMITY AROM/PROM: Just starting wrist and elbow AROM - did not measure objectively.  No shoulder ROM allowed.   A/PROM RIGHT   eval   Shoulder extension   Shoulder flexion   Shoulder abduction   Shoulder internal rotation   Shoulder external rotation     (Blank rows = not tested)  A/PROM LEFT   eval  Shoulder extension   Shoulder flexion   Shoulder abduction   Shoulder internal rotation   Shoulder external rotation     (Blank rows = not tested)   LYMPHEDEMA ASSESSMENTS:  LANDMARK RIGHT eval  At axilla 30.4  15cm proximal to olecranon 29.4  10 cm proximal to olecranon process 28.3  Olecranon process 24.7  15cm proximal to ulnar styloid 22.1  10 cm proximal to ulnar styloid process 18.7  Just proximal to ulnar styloid process 16.2  Across hand at thumb web space 19.8  At base of 2nd digit 6.4  (Blank rows = not tested)  LANDMARK LEFT  eval  At axilla 33  15cm proximal to olecranon 32.5  10 cm proximal to olecranon process 34.5  Olecranon process 30.5  15cm proximal to ulnar styloid  28.7  10 cm proximal to ulnar styloid process 26.5  Just proximal to ulnar styloid process 18.5  Across hand at thumb web space 20.4  At base of 2nd digit 7.0  (Blank rows = not tested)  Volume:  Length 7cm Rt: Lt:   difference   PATIENT SURVEYS:  Quick Dash 68% limited   TODAY'S TREATMENT:                                                                                                                                         DATE: 03/15/23 Brief eval performed to have time for treatment due to severity of edema Education on options: CDT with velcro vs bandaging vs compression garments and how those would be very hard to put on at this time.   Discussed with Dwaine Gale PT, and valerie  rosenberger PTA about best options and we will be able to start bandaging with addition of some time slots.  Applied lotion, tg soft size medium, fingers 1-5 with mollelast, artiflex x 1 from hand to axilla with extra padding at elbow, 1 6cm hand into spiral up forearm, and 1 10 cm from mid forearm to axilla avoiding incision. TG soft with a large fold comfort.   Education on removing bandages if they get uncomfortable and if they start to slide  down and that they won't stay on until Monday most likely.   Pt will bring back all items and use TG soft as able.  Encouraged active wrist and elbow movements.  Assisted pt to donn clothes and sling.    PATIENT EDUCATION:  Education details: per today's note Person educated: Patient Education method: Programmer, multimedia, Facilities manager, and Verbal cues Education comprehension: verbalized understanding  HOME EXERCISE PROGRAM: none  ASSESSMENT:  CLINICAL IMPRESSION: Patient is a 77 y.o. female who was seen today for physical therapy evaluation and treatment for her Lt arm lymphedema after reverse total shoulder due to a bad humerus fracture from a fall.  Her repair was only 2 weeks ago at this point so we discussed options and decided to opt for a light bandage immediately with pt undergoing CDT 3x per week.  Her Lt arm has more fluid volume at this time. Pt demonstrates understanding about removing bandage and what to watch for at home.  Will assess DC garment needs as pt reduces. May need to combine PT for shoulder with lymphedema therapy, but PT will confer with the orthopedic PA for instruction per pt request.    OBJECTIVE IMPAIRMENTS: decreased activity tolerance, decreased knowledge of condition, decreased knowledge of use of DME, decreased mobility, decreased ROM, and increased edema.   ACTIVITY LIMITATIONS: carrying, lifting, sleeping, bathing, dressing, and reach over head  PARTICIPATION LIMITATIONS: meal prep, cleaning, laundry, shopping, and  community activity  PERSONAL FACTORS: Age and 1 comorbidity: ALND and radiation hx  are also affecting patient's functional outcome.   REHAB POTENTIAL: Excellent  CLINICAL DECISION MAKING: Evolving/moderate complexity  EVALUATION COMPLEXITY: Moderate  GOALS: Goals reviewed with patient? Yes  SHORT TERM GOALS: Target date: 04/08/23    Pt will be ind with self MLD for edema reduction Baseline: Goal status: INITIAL  2.  Pt will decrease volume to or less to demonstrate improving edema status Baseline:  Goal status: INITIAL  3.  Pt will be evaluated and treated for shoulder with goals added as needed as instructed by MD during future visits.  Baseline:  Goal status: INITIAL   LONG TERM GOALS: Target date: 05/10/23  Pt will reduce to less than volume difference between sides to demonstrate no active lymphedema Baseline:  Goal status: INITIAL  2.  Pt will obtain compression as needed for discharge depending on reduction Baseline:  Goal status: INITIAL  PLAN:  PT FREQUENCY: 3x/week  PT DURATION: 8 weeks  PLANNED INTERVENTIONS: 97164- PT Re-evaluation, 97110-Therapeutic exercises, 97530- Therapeutic activity, 97112- Neuromuscular re-education, 97535- Self Care, and 13086- Manual therapy  PLAN FOR NEXT SESSION: (finish subjective eval portion)  how was bandaging? Start to perform and educate patient on MLD, continue bandaging adding as needed - starting with only 2 *No active shoulder motion allowed until okayed by MD at least 03/27/23 or passive shoulder motion allowed until at least 04/23/23.  Encourage elevation, active wrist and elbow motion   Idamae Lusher, PT 03/15/2023, 5:18 PM

## 2023-03-15 NOTE — Telephone Encounter (Signed)
 Rx sent to requested Pharmacy.

## 2023-03-18 NOTE — Therapy (Unsigned)
OUTPATIENT PT UPPER EXTREMITY LYMPHEDEMA TREATMENT  Patient Name: Sarah Underwood MRN: [id number] DOB:December 23, 1945, 77 y.o., female Today's Date: 03/18/2023  END OF SESSION:    Past Medical History:  Diagnosis Date   Basal cell carcinoma 1994   right side of node   Benign positional vertigo    with rolling over    Celiac disease    Dysrhythmia    Generalized anxiety disorder    GERD (gastroesophageal reflux disease)    Graves disease 2007   thyroid nodule   Hard of hearing    Heart murmur    History of breast cancer 1995   left - treated with mastectomy   History of radiation therapy 2001   37 sessions following recurrence of breast cancer in left chest wall   Hypertension    Knee pain, bilateral 12/14/2014   Major depressive disorder in remission    Mild persistent asthma, uncomplicated 06/29/2015   Nocturia    OA (osteoarthritis) of hip 09/08/2015   Ovarian mass    small solid nodule 8x40mm: Right ovary - stable since 2000, follow u/s do not show this.   Renal mass 2021   followed by Dr. Berneice Heinrich, [facility].  Having every 6 month follow up   Seasonal allergies    Stress fracture of metatarsal bone of left foot 12/15/2019   Vegetarian diet    Past Surgical History:  Procedure Laterality Date   APPENDECTOMY     basal cell removal  7/94   right nose   BREAST BIOPSY Right 1987   benign fibroadenoma   breast cancer recurrence Left 01/2000   at site of left chest wall after previous mastectomy with removal and treatment with radiaton.   CARPAL TUNNEL RELEASE Right 05/08/2013   Procedure: RIGHT CARPAL TUNNEL RELEASE;  Surgeon: Wyn Forster., MD;  Location: [facility];  Service: Orthopedics;  Laterality: Right;   CATARACT EXTRACTION     03/20/14 left eye, 12/15 right eye   COLPOSCOPY W/ BIOPSY / CURETTAGE  03/12/12   benign, but + HR HPV   EYE SURGERY     bilat cataract surgery    MASTECTOMY Left 3/95   Stage IIb 0/14 LN ER/ PR + with mastectomy -  Dr. Mardella Layman   MOHS SURGERY     right nare   OVARY SURGERY  1974   lt   REVERSE SHOULDER ARTHROPLASTY Left 02/26/2023   Procedure: REVERSE SHOULDER ARTHROPLASTY;  Surgeon: Yolonda Kida, MD;  Location: [facility] OR;  Service: Orthopedics;  Laterality: Left;   TONSILLECTOMY AND ADENOIDECTOMY  1950   TOTAL HIP ARTHROPLASTY Right 09/08/2015   Procedure: TOTAL HIP ARTHROPLASTY ANTERIOR APPROACH;  Surgeon: Ollen Gross, MD;  Location: [facility];  Service: Orthopedics;  Laterality: Right;   Patient Active Problem List   Diagnosis Date Noted   Shoulder fracture 02/23/2023   Closed fracture of proximal end of left humerus, unspecified fracture morphology, initial encounter 02/22/2023   Thyroid nodule 02/22/2023   Major depressive disorder in remission 03/29/2020   Generalized anxiety disorder    Benign positional vertigo    Hypertension    Heart murmur    Stress fracture of metatarsal bone of left foot 12/15/2019   History of revision of total replacement of right hip joint 11/26/2017   Leg edema, left 09/26/2016   Post-nasal drainage 03/30/2016   OA (osteoarthritis) of hip 09/08/2015   Mild persistent asthma, uncomplicated 06/29/2015   Hip pain, bilateral 12/14/2014   Knee pain, bilateral 12/14/2014  Postmenopausal atrophic vaginitis 02/23/2013   Graves disease 2007   History of radiation therapy 2001   History of breast cancer 1995   Basal cell carcinoma 1994    PCP: Alysia Penna, MD  REFERRING PROVIDER: Thornton Papas, MD  REFERRING DIAG:  Diagnosis  C50.511,Z17.0 (ICD-10-CM) - Malignant neoplasm of lower-outer quadrant of right breast of female, estrogen receptor positive (HCC)  I89.0 (ICD-10-CM) - Lymphedema    THERAPY DIAG:  Postmastectomy lymphedema  S/P reverse total shoulder arthroplasty, left  ONSET DATE: 02/26/23  Rationale for Evaluation and Treatment: Rehabilitation  SUBJECTIVE                                                                                                                                                                                            SUBJECTIVE STATEMENT:   PERTINENT HISTORY: Recent fall with left arm fracture.  Surgery with Reverse TSR 02/26/23.  This is the same side as her mastectomy which was diagnosed in 1995 with chest wall recurrence in 2001.  No remaining lymph nodes.   Shoulder surgery: Dr. Duwayne Heck -See media section for post-op approved exercises and okay for compression therapy.    PAIN:  Are you having pain? No Pt denies any real pain and calls it more of a burning  PRECAUTIONS: Can start AROM/AAROM at 03/27/23 and PROM at 2 months (04/23/23), use sling at night until 03/27/23  RED FLAGS: None   WEIGHT BEARING RESTRICTIONS: No  FALLS:  Has patient fallen in last 6 months? Yes. Number of falls 1 - does not remember details of her fall. Just remembers she was going outside.   LIVING ENVIRONMENT: Lives with: lives alone Has a CNA for assistance at home.   OCCUPATION: Retired  LEISURE: Not currently exercising  HAND DOMINANCE : Right  PRIOR LEVEL OF FUNCTION: Independent  PATIENT GOALS: prevent chronic lymphedema after this fall   OBJECTIVE (FROM EVAL) Note: Objective measures were completed at Evaluation unless otherwise noted.  COGNITION:  Overall cognitive status: Within functional limits for tasks assessed   PALPATION: +2 pitting edema back of hand, forearm, below, to around the distal 1/3rd of the upper arm.   OBSERVATIONS / OTHER ASSESSMENTS: bruising all fingers and hand, elbow, and upper arm from surgery.  Skin tight and shiny from edema.     POSTURE: wearing sling upon arrival and leaving  UPPER EXTREMITY AROM/PROM: Just starting wrist and elbow AROM - did not measure objectively.  No shoulder ROM allowed.   LYMPHEDEMA ASSESSMENTS:  LANDMARK RIGHT eval  At axilla 30.4  15cm proximal to olecranon 29.4  10 cm proximal to olecranon process 28.3  Olecranon process 24.7   15cm proximal to ulnar styloid 22.1  10 cm proximal to ulnar styloid process 18.7  Just proximal to ulnar styloid process 16.2  Across hand at thumb web space 19.8  At base of 2nd digit 6.4  (Blank rows = not tested)  LANDMARK LEFT  eval LEFT 03/19/2023  At axilla 33   15cm proximal to olecranon 32.5   10 cm proximal to olecranon process 34.5   Olecranon process 30.5   15cm proximal to ulnar styloid  28.7   10 cm proximal to ulnar styloid process 26.5   Just proximal to ulnar styloid process 18.5   Across hand at thumb web space 20.4   At base of 2nd digit 7.0   (Blank rows = not tested)  Volume:  Length 7cm Rt: Lt:   difference   PATIENT SURVEYS:  Quick Dash 68% limited   TODAY'S TREATMENT:                                                                                                                                         DATE:  03/19/2023:   03/15/23 Brief eval performed to have time for treatment due to severity of edema Education on options: CDT with velcro vs bandaging vs compression garments and how those would be very hard to put on at this time.   Discussed with Dwaine Gale PT, and valerie rosenberger PTA about best options and we will be able to start bandaging with addition of some time slots.  Applied lotion, tg soft size medium, fingers 1-5 with mollelast, artiflex x 1 from hand to axilla with extra padding at elbow, 1 6cm hand into spiral up forearm, and 1 10 cm from mid forearm to axilla avoiding incision. TG soft with a large fold comfort.   Education on removing bandages if they get uncomfortable and if they start to slide down and that they won't stay on until Monday most likely.   Pt will bring back all items and use TG soft as able.  Encouraged active wrist and elbow movements.  Assisted pt to donn clothes and sling.    PATIENT EDUCATION:  Education details: per today's note Person educated: Patient Education method:  Programmer, multimedia, Demonstration, and Verbal cues Education comprehension: verbalized understanding  HOME EXERCISE PROGRAM: none  ASSESSMENT:  CLINICAL IMPRESSION:   OBJECTIVE IMPAIRMENTS: decreased activity tolerance, decreased knowledge of condition, decreased knowledge of use of DME, decreased mobility, decreased ROM, and increased edema.   ACTIVITY LIMITATIONS: carrying, lifting, sleeping, bathing, dressing, and reach over head  PARTICIPATION LIMITATIONS: meal prep, cleaning, laundry, shopping, and community activity  PERSONAL FACTORS: Age and 1 comorbidity: ALND and radiation hx  are also affecting patient's functional outcome.   REHAB POTENTIAL: Excellent  CLINICAL DECISION MAKING: Evolving/moderate complexity  EVALUATION COMPLEXITY: Moderate  GOALS: Goals reviewed with patient? Yes  SHORT TERM GOALS: Target date: 04/08/23  Pt will be ind with self MLD for edema reduction Baseline: Goal status: INITIAL  2.  Pt will decrease volume to or less to demonstrate improving edema status Baseline:  Goal status: INITIAL  3.  Pt will be evaluated and treated for shoulder with goals added as needed as instructed by MD during future visits.  Baseline:  Goal status: INITIAL   LONG TERM GOALS: Target date: 05/10/23  Pt will reduce to less than volume difference between sides to demonstrate no active lymphedema Baseline:  Goal status: INITIAL  2.  Pt will obtain compression as needed for discharge depending on reduction Baseline:  Goal status: INITIAL  PLAN:  PT FREQUENCY: 3x/week  PT DURATION: 8 weeks  PLANNED INTERVENTIONS: 97164- PT Re-evaluation, 97110-Therapeutic exercises, 97530- Therapeutic activity, 97112- Neuromuscular re-education, 97535- Self Care, and 09323- Manual therapy  PLAN FOR NEXT SESSION: (finish subjective eval portion)  how was bandaging? Start to perform and educate patient on MLD, continue bandaging adding as needed - starting with  only 2 *No active shoulder motion allowed until okayed by MD at least 03/27/23 or passive shoulder motion allowed until at least 04/23/23.  Encourage elevation, active wrist and elbow motion   Dominic Mahaney,MARTI COOPER, PT 03/18/2023, 10:59 AM

## 2023-03-19 ENCOUNTER — Encounter: Payer: Self-pay | Admitting: Physical Therapy

## 2023-03-19 ENCOUNTER — Ambulatory Visit: Payer: Medicare PPO | Admitting: Physical Therapy

## 2023-03-19 DIAGNOSIS — Z96612 Presence of left artificial shoulder joint: Secondary | ICD-10-CM

## 2023-03-19 DIAGNOSIS — Z4789 Encounter for other orthopedic aftercare: Secondary | ICD-10-CM | POA: Diagnosis not present

## 2023-03-19 DIAGNOSIS — I972 Postmastectomy lymphedema syndrome: Secondary | ICD-10-CM | POA: Diagnosis not present

## 2023-03-19 DIAGNOSIS — Z170 Estrogen receptor positive status [ER+]: Secondary | ICD-10-CM | POA: Diagnosis not present

## 2023-03-19 DIAGNOSIS — C50511 Malignant neoplasm of lower-outer quadrant of right female breast: Secondary | ICD-10-CM | POA: Diagnosis not present

## 2023-03-19 NOTE — Therapy (Signed)
OUTPATIENT PT UPPER EXTREMITY LYMPHEDEMA TREATMENT  Patient Name: Sarah Underwood MRN: [id number] DOB:1945-10-06, 77 y.o., female Today's Date: 03/19/2023  END OF SESSION:  PT End of Session - 03/19/23 1507     Visit Number 2    Number of Visits 25    Date for PT Re-Evaluation 05/10/23    Authorization Type [employer] Auth needed    PT Start Time 1403    PT Stop Time 1504    PT Time Calculation (min) 61 min    Activity Tolerance Patient tolerated treatment well    Behavior During Therapy [facility] for tasks assessed/performed              Past Medical History:  Diagnosis Date   Basal cell carcinoma 1994   right side of node   Benign positional vertigo    with rolling over    Celiac disease    Dysrhythmia    Generalized anxiety disorder    GERD (gastroesophageal reflux disease)    Graves disease 2007   thyroid nodule   Hard of hearing    Heart murmur    History of breast cancer 1995   left - treated with mastectomy   History of radiation therapy 2001   37 sessions following recurrence of breast cancer in left chest wall   Hypertension    Knee pain, bilateral 12/14/2014   Major depressive disorder in remission    Mild persistent asthma, uncomplicated 06/29/2015   Nocturia    OA (osteoarthritis) of hip 09/08/2015   Ovarian mass    small solid nodule 8x66mm: Right ovary - stable since 2000, follow u/s do not show this.   Renal mass 2021   followed by Dr. Berneice Heinrich, [facility].  Having every 6 month follow up   Seasonal allergies    Stress fracture of metatarsal bone of left foot 12/15/2019   Vegetarian diet    Past Surgical History:  Procedure Laterality Date   APPENDECTOMY     basal cell removal  7/94   right nose   BREAST BIOPSY Right 1987   benign fibroadenoma   breast cancer recurrence Left 01/2000   at site of left chest wall after previous mastectomy with removal and treatment with radiaton.   CARPAL TUNNEL RELEASE Right 05/08/2013   Procedure: RIGHT CARPAL TUNNEL  RELEASE;  Surgeon: Wyn Forster., MD;  Location: [facility];  Service: Orthopedics;  Laterality: Right;   CATARACT EXTRACTION     03/20/14 left eye, 12/15 right eye   COLPOSCOPY W/ BIOPSY / CURETTAGE  03/12/12   benign, but + HR HPV   EYE SURGERY     bilat cataract surgery    MASTECTOMY Left 3/95   Stage IIb 0/14 LN ER/ PR + with mastectomy - Dr. Mardella Layman   MOHS SURGERY     right nare   OVARY SURGERY  1974   lt   REVERSE SHOULDER ARTHROPLASTY Left 02/26/2023   Procedure: REVERSE SHOULDER ARTHROPLASTY;  Surgeon: Yolonda Kida, MD;  Location: [facility] OR;  Service: Orthopedics;  Laterality: Left;   TONSILLECTOMY AND ADENOIDECTOMY  1950   TOTAL HIP ARTHROPLASTY Right 09/08/2015   Procedure: TOTAL HIP ARTHROPLASTY ANTERIOR APPROACH;  Surgeon: Ollen Gross, MD;  Location: [facility];  Service: Orthopedics;  Laterality: Right;   Patient Active Problem List   Diagnosis Date Noted   Shoulder fracture 02/23/2023   Closed fracture of proximal end of left humerus, unspecified fracture morphology, initial encounter 02/22/2023   Thyroid nodule 02/22/2023  Major depressive disorder in remission 03/29/2020   Generalized anxiety disorder    Benign positional vertigo    Hypertension    Heart murmur    Stress fracture of metatarsal bone of left foot 12/15/2019   History of revision of total replacement of right hip joint 11/26/2017   Leg edema, left 09/26/2016   Post-nasal drainage 03/30/2016   OA (osteoarthritis) of hip 09/08/2015   Mild persistent asthma, uncomplicated 06/29/2015   Hip pain, bilateral 12/14/2014   Knee pain, bilateral 12/14/2014   Postmenopausal atrophic vaginitis 02/23/2013   Graves disease 2007   History of radiation therapy 2001   History of breast cancer 1995   Basal cell carcinoma 1994    PCP: Alysia Penna, MD  REFERRING PROVIDER: Thornton Papas, MD  REFERRING DIAG:  Diagnosis  C50.511,Z17.0 (ICD-10-CM) - Malignant neoplasm of lower-outer  quadrant of right breast of female, estrogen receptor positive (HCC)  I89.0 (ICD-10-CM) - Lymphedema    THERAPY DIAG:  Postmastectomy lymphedema  S/P reverse total shoulder arthroplasty, left  ONSET DATE: 02/26/23  Rationale for Evaluation and Treatment: Rehabilitation  SUBJECTIVE                                                                                                                                                                                           SUBJECTIVE STATEMENT:  Can you look at this exercise and make sure I am doing it right?  PERTINENT HISTORY: Recent fall with left arm fracture.  Surgery with Reverse TSR 02/26/23.  This is the same side as her mastectomy which was diagnosed in 1995 with chest wall recurrence in 2001.  No remaining lymph nodes.   Shoulder surgery: Dr. Duwayne Heck -See media section for post-op approved exercises and okay for compression therapy.    PAIN:  Are you having pain? No   PRECAUTIONS: Can start AROM/AAROM at 03/27/23 and PROM at 2 months (04/23/23), use sling at night until 03/27/23  RED FLAGS: None   WEIGHT BEARING RESTRICTIONS: No  FALLS:  Has patient fallen in last 6 months? Yes. Number of falls 1 - does not remember details of her fall. Just remembers she was going outside.   LIVING ENVIRONMENT: Lives with: lives alone Has a CNA for assistance at home.   OCCUPATION: Retired  LEISURE: Not currently exercising  HAND DOMINANCE : Right  PRIOR LEVEL OF FUNCTION: Independent  PATIENT GOALS: prevent chronic lymphedema after this fall   OBJECTIVE (FROM EVAL) Note: Objective measures were completed at Evaluation unless otherwise noted.  COGNITION:  Overall cognitive status: Within functional limits for tasks assessed   PALPATION: +2 pitting edema back of hand,  forearm, below, to around the distal 1/3rd of the upper arm.   OBSERVATIONS / OTHER ASSESSMENTS: bruising all fingers and hand, elbow, and upper arm from  surgery.  Skin tight and shiny from edema.     POSTURE: wearing sling upon arrival and leaving  UPPER EXTREMITY AROM/PROM: Just starting wrist and elbow AROM - did not measure objectively.  No shoulder ROM allowed.   LYMPHEDEMA ASSESSMENTS:  LANDMARK RIGHT eval  At axilla 30.4  15cm proximal to olecranon 29.4  10 cm proximal to olecranon process 28.3  Olecranon process 24.7  15cm proximal to ulnar styloid 22.1  10 cm proximal to ulnar styloid process 18.7  Just proximal to ulnar styloid process 16.2  Across hand at thumb web space 19.8  At base of 2nd digit 6.4  (Blank rows = not tested)  LANDMARK LEFT  eval LEFT 03/19/2023  At axilla 33   15cm proximal to olecranon 32.5   10 cm proximal to olecranon process 34.5   Olecranon process 30.5   15cm proximal to ulnar styloid  28.7   10 cm proximal to ulnar styloid process 26.5   Just proximal to ulnar styloid process 18.5   Across hand at thumb web space 20.4   At base of 2nd digit 7.0   (Blank rows = not tested)  Volume:  Length 7cm Rt: Lt:   difference   PATIENT SURVEYS:  Quick Dash 68% limited   TODAY'S TREATMENT:                                                                                                                                         DATE:  03/19/2023: Manual lymph drainage in supine as follows: short neck, right axillary nodes, 5 diaphragmatic breaths, anterior inter-axillary anastamoses, Left upper extremity from fingers and dorsal hand to lateral shoulder redirecting along pathways (avoiding surgical site) Compression bandaging: cocoa butter from hand to axilla, thick stockinette, elastomull to digits 1-5, rosidal from hand to just below incision, 1 6cm bandage at hand and wrist, 1 10 cm from mid forearm to axilla avoiding incision then folded TG soft over  03/15/23 Brief eval performed to have time for treatment due to severity of edema Education on options: CDT with velcro vs  bandaging vs compression garments and how those would be very hard to put on at this time.   Discussed with Dwaine Gale PT, and valerie rosenberger PTA about best options and we will be able to start bandaging with addition of some time slots.  Applied lotion, tg soft size medium, fingers 1-5 with mollelast, artiflex x 1 from hand to axilla with extra padding at elbow, 1 6cm hand into spiral up forearm, and 1 10 cm from mid forearm to axilla avoiding incision. TG soft with a large fold comfort.   Education on removing bandages if they get uncomfortable and if they start to slide  down and that they won't stay on until Monday most likely.   Pt will bring back all items and use TG soft as able.  Encouraged active wrist and elbow movements.  Assisted pt to donn clothes and sling.    PATIENT EDUCATION:  Education details: per today's note Person educated: Patient Education method: Programmer, multimedia, Demonstration, and Verbal cues Education comprehension: verbalized understanding  HOME EXERCISE PROGRAM: none  ASSESSMENT:  CLINICAL IMPRESSION: Began MLD and compression bandaging to LUE today. Educated her in correct form for supine AAROM to less than 90 degrees as instructed by her note from her doctor.   OBJECTIVE IMPAIRMENTS: decreased activity tolerance, decreased knowledge of condition, decreased knowledge of use of DME, decreased mobility, decreased ROM, and increased edema.   ACTIVITY LIMITATIONS: carrying, lifting, sleeping, bathing, dressing, and reach over head  PARTICIPATION LIMITATIONS: meal prep, cleaning, laundry, shopping, and community activity  PERSONAL FACTORS: Age and 1 comorbidity: ALND and radiation hx  are also affecting patient's functional outcome.   REHAB POTENTIAL: Excellent  CLINICAL DECISION MAKING: Evolving/moderate complexity  EVALUATION COMPLEXITY: Moderate  GOALS: Goals reviewed with patient? Yes  SHORT TERM GOALS: Target date: 04/08/23    Pt will be ind  with self MLD for edema reduction Baseline: Goal status: INITIAL  2.  Pt will decrease volume to or less to demonstrate improving edema status Baseline:  Goal status: INITIAL  3.  Pt will be evaluated and treated for shoulder with goals added as needed as instructed by MD during future visits.  Baseline:  Goal status: INITIAL   LONG TERM GOALS: Target date: 05/10/23  Pt will reduce to less than volume difference between sides to demonstrate no active lymphedema Baseline:  Goal status: INITIAL  2.  Pt will obtain compression as needed for discharge depending on reduction Baseline:  Goal status: INITIAL  PLAN:  PT FREQUENCY: 3x/week  PT DURATION: 8 weeks  PLANNED INTERVENTIONS: 97164- PT Re-evaluation, 97110-Therapeutic exercises, 97530- Therapeutic activity, 97112- Neuromuscular re-education, 97535- Self Care, and 40981- Manual therapy  PLAN FOR NEXT SESSION: (finish subjective eval portion)  how was bandaging? Start to perform and educate patient on MLD, continue bandaging adding as needed - starting with only 2. Per dr orders: Can begin passive forward flexion to 90 degrees, scapular retractions, table slides, limite ER to neurtral until 4 weeks post op. Starting week 4 post op she can begin all active and AAROM as tolerated. No PROM until 2 months post op. She can discontinue the sling during the day, use the sling at night for 2 more weeks.    Milagros Loll Finlayson, PT 03/19/2023, 3:17 PM

## 2023-03-20 ENCOUNTER — Telehealth: Payer: Self-pay | Admitting: Cardiology

## 2023-03-20 NOTE — Telephone Encounter (Signed)
I spoke with patient.  She was originally on Simvastatin 40 mg by mouth daily.  This was decreased to 20 mg on Monday, Wednesday and Friday.  Patient has been splitting 40 mg tablets in half. She recently needed refill and it was sent to pharmacy for 10 mg on Monday, Wednesday and Friday.  Patient asking what dose she should be taking.  Per last office note on 01/19/22 patient was to come back in a month for lab work.  Results are not in chart and patient is not sure if she had done.  I told her these results would be needed to determine dose.  Patient is to follow up with Dr Jacinto Halim as needed.  She thinks PCP checked lab work in June.  She will contact PCP to see if they will follow her cholesterol and refill her simvastatin.  If PCP is not able to do this patient will ask for most recent lipid results to be faxed to Dr Jacinto Halim

## 2023-03-20 NOTE — Telephone Encounter (Signed)
Pt c/o medication issue:  1. Name of Medication:   simvastatin (ZOCOR) 20 MG tablet   2. How are you currently taking this medication (dosage and times per day)?     3. Are you having a reaction (difficulty breathing--STAT)?   4. What is your medication issue?   Patient wants call back to discuss medication was changed to 20 mg and instructions to take the medication.

## 2023-03-21 ENCOUNTER — Ambulatory Visit: Payer: Medicare PPO

## 2023-03-21 DIAGNOSIS — I972 Postmastectomy lymphedema syndrome: Secondary | ICD-10-CM | POA: Diagnosis not present

## 2023-03-21 DIAGNOSIS — Z4789 Encounter for other orthopedic aftercare: Secondary | ICD-10-CM | POA: Diagnosis not present

## 2023-03-21 DIAGNOSIS — M170 Bilateral primary osteoarthritis of knee: Secondary | ICD-10-CM | POA: Diagnosis not present

## 2023-03-21 DIAGNOSIS — Z96612 Presence of left artificial shoulder joint: Secondary | ICD-10-CM

## 2023-03-21 DIAGNOSIS — Z170 Estrogen receptor positive status [ER+]: Secondary | ICD-10-CM | POA: Diagnosis not present

## 2023-03-21 DIAGNOSIS — C50511 Malignant neoplasm of lower-outer quadrant of right female breast: Secondary | ICD-10-CM | POA: Diagnosis not present

## 2023-03-21 NOTE — Therapy (Signed)
OUTPATIENT PT UPPER EXTREMITY LYMPHEDEMA TREATMENT  Patient Name: Sarah Underwood MRN: [id number] DOB:08/30/1945, 77 y.o., female Today's Date: 03/21/2023  END OF SESSION:  PT End of Session - 03/21/23 1206     Visit Number 3    Number of Visits 25    Date for PT Re-Evaluation 05/10/23    Authorization Type [employer] Auth needed    PT Start Time 1204    PT Stop Time 1305    PT Time Calculation (min) 61 min    Activity Tolerance Patient tolerated treatment well    Behavior During Therapy WFL for tasks assessed/performed              Past Medical History:  Diagnosis Date   Basal cell carcinoma 1994   right side of node   Benign positional vertigo    with rolling over    Celiac disease    Dysrhythmia    Generalized anxiety disorder    GERD (gastroesophageal reflux disease)    Graves disease 2007   thyroid nodule   Hard of hearing    Heart murmur    History of breast cancer 1995   left - treated with mastectomy   History of radiation therapy 2001   37 sessions following recurrence of breast cancer in left chest wall   Hypertension    Knee pain, bilateral 12/14/2014   Major depressive disorder in remission    Mild persistent asthma, uncomplicated 06/29/2015   Nocturia    OA (osteoarthritis) of hip 09/08/2015   Ovarian mass    small solid nodule 8x64mm: Right ovary - stable since 2000, follow u/s do not show this.   Renal mass 2021   followed by Dr. Berneice Heinrich, [facility].  Having every 6 month follow up   Seasonal allergies    Stress fracture of metatarsal bone of left foot 12/15/2019   Vegetarian diet    Past Surgical History:  Procedure Laterality Date   APPENDECTOMY     basal cell removal  7/94   right nose   BREAST BIOPSY Right 1987   benign fibroadenoma   breast cancer recurrence Left 01/2000   at site of left chest wall after previous mastectomy with removal and treatment with radiaton.   CARPAL TUNNEL RELEASE Right 05/08/2013   Procedure: RIGHT CARPAL TUNNEL  RELEASE;  Surgeon: Wyn Forster., MD;  Location: [facility];  Service: Orthopedics;  Laterality: Right;   CATARACT EXTRACTION     03/20/14 left eye, 12/15 right eye   COLPOSCOPY W/ BIOPSY / CURETTAGE  03/12/12   benign, but + HR HPV   EYE SURGERY     bilat cataract surgery    MASTECTOMY Left 3/95   Stage IIb 0/14 LN ER/ PR + with mastectomy - Dr. Mardella Layman   MOHS SURGERY     right nare   OVARY SURGERY  1974   lt   REVERSE SHOULDER ARTHROPLASTY Left 02/26/2023   Procedure: REVERSE SHOULDER ARTHROPLASTY;  Surgeon: Yolonda Kida, MD;  Location: [facility];  Service: Orthopedics;  Laterality: Left;   TONSILLECTOMY AND ADENOIDECTOMY  1950   TOTAL HIP ARTHROPLASTY Right 09/08/2015   Procedure: TOTAL HIP ARTHROPLASTY ANTERIOR APPROACH;  Surgeon: Ollen Gross, MD;  Location: [facility];  Service: Orthopedics;  Laterality: Right;   Patient Active Problem List   Diagnosis Date Noted   Shoulder fracture 02/23/2023   Closed fracture of proximal end of left humerus, unspecified fracture morphology, initial encounter 02/22/2023   Thyroid nodule 02/22/2023  Major depressive disorder in remission 03/29/2020   Generalized anxiety disorder    Benign positional vertigo    Hypertension    Heart murmur    Stress fracture of metatarsal bone of left foot 12/15/2019   History of revision of total replacement of right hip joint 11/26/2017   Leg edema, left 09/26/2016   Post-nasal drainage 03/30/2016   OA (osteoarthritis) of hip 09/08/2015   Mild persistent asthma, uncomplicated 06/29/2015   Hip pain, bilateral 12/14/2014   Knee pain, bilateral 12/14/2014   Postmenopausal atrophic vaginitis 02/23/2013   Graves disease 2007   History of radiation therapy 2001   History of breast cancer 1995   Basal cell carcinoma 1994    PCP: Alysia Penna, MD  REFERRING PROVIDER: Thornton Papas, MD  REFERRING DIAG:  Diagnosis  C50.511,Z17.0 (ICD-10-CM) - Malignant neoplasm of lower-outer  quadrant of right breast of female, estrogen receptor positive (HCC)  I89.0 (ICD-10-CM) - Lymphedema    THERAPY DIAG:  Postmastectomy lymphedema  S/P reverse total shoulder arthroplasty, left  ONSET DATE: 02/26/23  Rationale for Evaluation and Treatment: Rehabilitation  SUBJECTIVE                                                                                                                                                                                           SUBJECTIVE STATEMENT:  I really want to be able to bandage at home. I brought my CNA so she can learn to help my with bandaging because she comes daily.   PERTINENT HISTORY: Recent fall with left arm fracture.  Surgery with Reverse TSR 02/26/23.  This is the same side as her mastectomy which was diagnosed in 1995 with chest wall recurrence in 2001.  No remaining lymph nodes.   Shoulder surgery: Dr. Duwayne Heck -See media section for post-op approved exercises and okay for compression therapy.    PAIN:  Are you having pain? No   PRECAUTIONS: Can start AROM/AAROM at 03/27/23 and PROM at 2 months (04/23/23), use sling at night until 03/27/23  RED FLAGS: None   WEIGHT BEARING RESTRICTIONS: No  FALLS:  Has patient fallen in last 6 months? Yes. Number of falls 1 - does not remember details of her fall. Just remembers she was going outside.   LIVING ENVIRONMENT: Lives with: lives alone Has a CNA for assistance at home.   OCCUPATION: Retired  LEISURE: Not currently exercising  HAND DOMINANCE : Right  PRIOR LEVEL OF FUNCTION: Independent  PATIENT GOALS: prevent chronic lymphedema after this fall   OBJECTIVE (FROM EVAL) Note: Objective measures were completed at Evaluation unless otherwise noted.  COGNITION:  Overall cognitive status: Within  functional limits for tasks assessed   PALPATION: +2 pitting edema back of hand, forearm, below, to around the distal 1/3rd of the upper arm.   OBSERVATIONS / OTHER  ASSESSMENTS: bruising all fingers and hand, elbow, and upper arm from surgery.  Skin tight and shiny from edema.     POSTURE: wearing sling upon arrival and leaving  UPPER EXTREMITY AROM/PROM: Just starting wrist and elbow AROM - did not measure objectively.  No shoulder ROM allowed.   LYMPHEDEMA ASSESSMENTS:  LANDMARK RIGHT eval  At axilla 30.4  15cm proximal to olecranon 29.4  10 cm proximal to olecranon process 28.3  Olecranon process 24.7  15cm proximal to ulnar styloid 22.1  10 cm proximal to ulnar styloid process 18.7  Just proximal to ulnar styloid process 16.2  Across hand at thumb web space 19.8  At base of 2nd digit 6.4  (Blank rows = not tested)  LANDMARK LEFT  eval LEFT 03/19/2023  At axilla 33   15cm proximal to olecranon 32.5   10 cm proximal to olecranon process 34.5   Olecranon process 30.5   15cm proximal to ulnar styloid  28.7   10 cm proximal to ulnar styloid process 26.5   Just proximal to ulnar styloid process 18.5   Across hand at thumb web space 20.4   At base of 2nd digit 7.0   (Blank rows = not tested)  Volume:  Length 7cm Rt: Lt:   difference   PATIENT SURVEYS:  Quick Dash 68% limited   TODAY'S TREATMENT:                                                                                                                                         DATE:  03/21/23: Manual Therapy Spent beginning of session instructing in pt and CNA in bandaging with demonstration and CNA videorecorded with phone with pts permission. Manual lymph drainage in supine as follows (shortened as time allowed): short neck, superficial and deep abdominals, right axillary nodes, right intact anterior thorax, anterior inter-axillary anastamoses, left inguinal nodes, left axillo-inguinal anastomosis, then only left upper arm as time allowed working from lateral, medial to lateral and lateral again, then redirecting along pathways (avoiding surgical  site) Compression bandaging (x 2 today, first time for instruction): cocoa butter from hand to axilla, thick stockinette, elastomull x 1.5 rolls to digits 1-5, artiflex from hand to just below incision, 1 6cm bandage at hand and forearm, 1 10 cm spiral with "X" at elbow from mid wrist to axilla avoiding incision then folded TG soft over  03/19/2023: Manual lymph drainage in supine as follows: short neck, right axillary nodes, 5 diaphragmatic breaths, anterior inter-axillary anastamoses, Left upper extremity from fingers and dorsal hand to lateral shoulder redirecting along pathways (avoiding surgical site) Compression bandaging: cocoa butter from hand to axilla, thick stockinette, elastomull to digits 1-5, rosidal from hand to just below  incision, 1 6cm bandage at hand and wrist, 1 10 cm from mid forearm to axilla avoiding incision then folded TG soft over  03/15/23 Brief eval performed to have time for treatment due to severity of edema Education on options: CDT with velcro vs bandaging vs compression garments and how those would be very hard to put on at this time.   Discussed with Dwaine Gale PT, and Radhika Dershem PTA about best options and we will be able to start bandaging with addition of some time slots.  Applied lotion, tg soft size medium, fingers 1-5 with mollelast, artiflex x 1 from hand to axilla with extra padding at elbow, 1 6cm hand into spiral up forearm, and 1 10 cm from mid forearm to axilla avoiding incision. TG soft with a large fold comfort.   Education on removing bandages if they get uncomfortable and if they start to slide down and that they won't stay on until Monday most likely.   Pt will bring back all items and use TG soft as able.  Encouraged active wrist and elbow movements.  Assisted pt to donn clothes and sling.    PATIENT EDUCATION:  Education details: per today's note Person educated: Patient Education method: Programmer, multimedia, Demonstration, and Verbal  cues Education comprehension: verbalized understanding  HOME EXERCISE PROGRAM: none  ASSESSMENT:  CLINICAL IMPRESSION: Continued CDT for LT UE today but spent extra time educating bandaging with pts CNA present. With pts permission she videorecorded therapist applying compression bandages to Lt UE. Then shortened MLD sequence as time allowed and bandaging again. Pt reports bandage comfortable at end of session and can tell her arm feels softer and much less tender than at start of care.   OBJECTIVE IMPAIRMENTS: decreased activity tolerance, decreased knowledge of condition, decreased knowledge of use of DME, decreased mobility, decreased ROM, and increased edema.   ACTIVITY LIMITATIONS: carrying, lifting, sleeping, bathing, dressing, and reach over head  PARTICIPATION LIMITATIONS: meal prep, cleaning, laundry, shopping, and community activity  PERSONAL FACTORS: Age and 1 comorbidity: ALND and radiation hx  are also affecting patient's functional outcome.   REHAB POTENTIAL: Excellent  CLINICAL DECISION MAKING: Evolving/moderate complexity  EVALUATION COMPLEXITY: Moderate  GOALS: Goals reviewed with patient? Yes  SHORT TERM GOALS: Target date: 04/08/23    Pt will be ind with self MLD for edema reduction Baseline: Goal status: INITIAL  2.  Pt will decrease volume to or less to demonstrate improving edema status Baseline:  Goal status: INITIAL  3.  Pt will be evaluated and treated for shoulder with goals added as needed as instructed by MD during future visits.  Baseline:  Goal status: INITIAL   LONG TERM GOALS: Target date: 05/10/23  Pt will reduce to less than volume difference between sides to demonstrate no active lymphedema Baseline:  Goal status: INITIAL  2.  Pt will obtain compression as needed for discharge depending on reduction Baseline:  Goal status: INITIAL  PLAN:  PT FREQUENCY: 3x/week  PT DURATION: 8 weeks  PLANNED INTERVENTIONS: [id number]-  PT Re-evaluation, 97110-Therapeutic exercises, 97530- Therapeutic activity, 97112- Neuromuscular re-education, 97535- Self Care, and 16109- Manual therapy  PLAN FOR NEXT SESSION: (finish subjective eval portion)  how was bandaging? Start to perform and educate patient on MLD, continue bandaging adding as needed - starting with only 2. Per dr orders: Can begin passive forward flexion to 90 degrees, scapular retractions, table slides, limite ER to neurtral until 4 weeks post op. Starting week 4 post op she can begin all  active and AAROM as tolerated and consider adding foam to bandage at that time for fibrosis at forearm. No PROM until 2 months post op. She can discontinue the sling during the day, use the sling at night for 2 more weeks.    Hermenia Bers, PTA 03/21/2023, 1:16 PM

## 2023-03-26 ENCOUNTER — Encounter: Payer: Self-pay | Admitting: Physical Therapy

## 2023-03-26 ENCOUNTER — Ambulatory Visit: Payer: Medicare PPO | Attending: Oncology | Admitting: Physical Therapy

## 2023-03-26 DIAGNOSIS — Z96612 Presence of left artificial shoulder joint: Secondary | ICD-10-CM | POA: Diagnosis not present

## 2023-03-26 DIAGNOSIS — I972 Postmastectomy lymphedema syndrome: Secondary | ICD-10-CM | POA: Insufficient documentation

## 2023-03-26 NOTE — Therapy (Signed)
OUTPATIENT PT UPPER EXTREMITY LYMPHEDEMA TREATMENT  Patient Name: Sarah Underwood MRN: [id number] DOB:[birth year], 77 y.o., female Today's Date: 03/26/2023  END OF SESSION:  PT End of Session - 03/26/23 1155     Visit Number 4    Number of Visits 25    Date for PT Re-Evaluation 05/10/23    Authorization Time Period 12 visits approved through 04/17/2023    PT Start Time 1156    PT Stop Time 1253    PT Time Calculation (min) 57 min    Activity Tolerance Patient tolerated treatment well    Behavior During Therapy [facility] for tasks assessed/performed               Past Medical History:  Diagnosis Date   Basal cell carcinoma 1994   right side of node   Benign positional vertigo    with rolling over    Celiac disease    Dysrhythmia    Generalized anxiety disorder    GERD (gastroesophageal reflux disease)    Graves disease 2007   thyroid nodule   Hard of hearing    Heart murmur    History of breast cancer 1995   left - treated with mastectomy   History of radiation therapy 2001   37 sessions following recurrence of breast cancer in left chest wall   Hypertension    Knee pain, bilateral 12/14/2014   Major depressive disorder in remission    Mild persistent asthma, uncomplicated 06/29/2015   Nocturia    OA (osteoarthritis) of hip 09/08/2015   Ovarian mass    small solid nodule 8x34mm: Right ovary - stable since 2000, follow u/s do not show this.   Renal mass 2021   followed by Dr. Berneice Heinrich, [facility].  Having every 6 month follow up   Seasonal allergies    Stress fracture of metatarsal bone of left foot 12/15/2019   Vegetarian diet    Past Surgical History:  Procedure Laterality Date   APPENDECTOMY     basal cell removal  7/94   right nose   BREAST BIOPSY Right 1987   benign fibroadenoma   breast cancer recurrence Left 01/2000   at site of left chest wall after previous mastectomy with removal and treatment with radiaton.   CARPAL TUNNEL RELEASE Right 05/08/2013    Procedure: RIGHT CARPAL TUNNEL RELEASE;  Surgeon: Wyn Forster., MD;  Location: [facility];  Service: Orthopedics;  Laterality: Right;   CATARACT EXTRACTION     03/20/14 left eye, 12/15 right eye   COLPOSCOPY W/ BIOPSY / CURETTAGE  03/12/12   benign, but + HR HPV   EYE SURGERY     bilat cataract surgery    MASTECTOMY Left 3/95   Stage IIb 0/14 LN ER/ PR + with mastectomy - Dr. Mardella Layman   MOHS SURGERY     right nare   OVARY SURGERY  1974   lt   REVERSE SHOULDER ARTHROPLASTY Left 02/26/2023   Procedure: REVERSE SHOULDER ARTHROPLASTY;  Surgeon: Yolonda Kida, MD;  Location: [facility] OR;  Service: Orthopedics;  Laterality: Left;   TONSILLECTOMY AND ADENOIDECTOMY  1950   TOTAL HIP ARTHROPLASTY Right 09/08/2015   Procedure: TOTAL HIP ARTHROPLASTY ANTERIOR APPROACH;  Surgeon: Ollen Gross, MD;  Location: [facility] ORS;  Service: Orthopedics;  Laterality: Right;   Patient Active Problem List   Diagnosis Date Noted   Shoulder fracture 02/23/2023   Closed fracture of proximal end of left humerus, unspecified fracture morphology, initial encounter 02/22/2023  Thyroid nodule 02/22/2023   Major depressive disorder in remission 03/29/2020   Generalized anxiety disorder    Benign positional vertigo    Hypertension    Heart murmur    Stress fracture of metatarsal bone of left foot 12/15/2019   History of revision of total replacement of right hip joint 11/26/2017   Leg edema, left 09/26/2016   Post-nasal drainage 03/30/2016   OA (osteoarthritis) of hip 09/08/2015   Mild persistent asthma, uncomplicated 06/29/2015   Hip pain, bilateral 12/14/2014   Knee pain, bilateral 12/14/2014   Postmenopausal atrophic vaginitis 02/23/2013   Graves disease 2007   History of radiation therapy 2001   History of breast cancer 1995   Basal cell carcinoma 1994    PCP: Alysia Penna, MD  REFERRING PROVIDER: Thornton Papas, MD  REFERRING DIAG:  Diagnosis  C50.511,Z17.0 (ICD-10-CM) -  Malignant neoplasm of lower-outer quadrant of right breast of female, estrogen receptor positive (HCC)  I89.0 (ICD-10-CM) - Lymphedema    THERAPY DIAG:  Postmastectomy lymphedema  S/P reverse total shoulder arthroplasty, left  ONSET DATE: 02/26/23  Rationale for Evaluation and Treatment: Rehabilitation  SUBJECTIVE                                                                                                                                                                                           SUBJECTIVE STATEMENT: They found some mass on my thyroid so I have to see my doctor tomorrow. My arm swelling is better but I had to take it off in the middle of the night Sunday night because it was hurting.  PERTINENT HISTORY: Recent fall with left arm fracture.  Surgery with Reverse TSR 02/26/23.  This is the same side as her mastectomy which was diagnosed in 1995 with chest wall recurrence in 2001.  No remaining lymph nodes.   Shoulder surgery: Dr. Duwayne Heck -See media section for post-op approved exercises and okay for compression therapy.    PAIN:  Are you having pain? No   PRECAUTIONS: Can start AROM/AAROM at 03/27/23 and PROM at 2 months (04/23/23), use sling at night until 03/27/23; Can begin passive forward flexion to 90 degrees, scapular retractions, table slides, limiteD ER to neurtral until 4 weeks post op. Starting week 4 post op she can begin all active and AAROM as tolerated and consider adding foam to bandage at that time for fibrosis at forearm. No PROM until 2 months post op. She can discontinue the sling during the day, use the sling at night for 2 more weeks.   RED FLAGS: None   WEIGHT BEARING RESTRICTIONS: No  FALLS:  Has patient fallen in  last 6 months? Yes. Number of falls 1 - does not remember details of her fall. Just remembers she was going outside.   LIVING ENVIRONMENT: Lives with: lives alone Has a CNA for assistance at home.   OCCUPATION:  Retired  LEISURE: Not currently exercising  HAND DOMINANCE : Right  PRIOR LEVEL OF FUNCTION: Independent  PATIENT GOALS: prevent chronic lymphedema after this fall   OBJECTIVE (FROM EVAL) Note: Objective measures were completed at Evaluation unless otherwise noted.  COGNITION:  Overall cognitive status: Within functional limits for tasks assessed   PALPATION: +2 pitting edema back of hand, forearm, below, to around the distal 1/3rd of the upper arm.   OBSERVATIONS / OTHER ASSESSMENTS: bruising all fingers and hand, elbow, and upper arm from surgery.  Skin tight and shiny from edema.     POSTURE: wearing sling upon arrival and leaving  UPPER EXTREMITY AROM/PROM: Just starting wrist and elbow AROM - did not measure objectively.  No shoulder ROM allowed.   LYMPHEDEMA ASSESSMENTS:  LANDMARK RIGHT eval  At axilla 30.4  15cm proximal to olecranon 29.4  10 cm proximal to olecranon process 28.3  Olecranon process 24.7  15cm proximal to ulnar styloid 22.1  10 cm proximal to ulnar styloid process 18.7  Just proximal to ulnar styloid process 16.2  Across hand at thumb web space 19.8  At base of 2nd digit 6.4  (Blank rows = not tested)  LANDMARK LEFT  eval LEFT 03/26/2023  At axilla 33 31  15cm proximal to olecranon 32.5 29.4  10 cm proximal to olecranon process 34.5 30.2  Olecranon process 30.5 26.6  15cm proximal to ulnar styloid  28.7 25.6  10 cm proximal to ulnar styloid process 26.5 23.4  Just proximal to ulnar styloid process 18.5 17.2  Across hand at thumb web space 20.4 18.8  At base of 2nd digit 7.0 6.1  (Blank rows = not tested)  Volume (at eval):  Length 7cm Rt: Lt:   difference   PATIENT SURVEYS:  Quick Dash 68% limited   TODAY'S TREATMENT:                                                                                                                                         DATE:   03/26/2023: Manual therapy Manual lymph drainage  to left UE in supine: short neck, right axillary and left inguinal nodes, anterior inter-axillary and anterior inter-axillary pathways. Left UE from fingers to axilla redirecting along pathways. Measured Left UE; see above for circumferential measurements. Compression bandaging to left UE: Applied Eucerin lotion and thick stockinette. Applied elastomull to all 5 fingers followed by Artiflex to entire hand and arm. Applied 6 cm, 10 cm, and 12 cm Comprilan compression bandages from hand to axilla. No c/o discomfort. Discussed while doing treatment the need for a plan for her holiday travels to the [place]. We discussed various options  and because of her recent shoulder replacement, we decided on an arm Farrow wrap and a compression glove. We discussed different compression amounts and decided on 20-30 mmHg knowing that is less than typically recommended but because of her recent surgery and the need to don these independently, we decided this made the most sense. Gave the pt information about getting those ordered from [facility] and told her we could help order them online if they are unable to help. She plans to go there tomorrow.   03/21/23: Manual Therapy Spent beginning of session instructing in pt and CNA in bandaging with demonstration and CNA videorecorded with phone with pts permission. Manual lymph drainage in supine as follows (shortened as time allowed): short neck, superficial and deep abdominals, right axillary nodes, right intact anterior thorax, anterior inter-axillary anastamoses, left inguinal nodes, left axillo-inguinal anastomosis, then only left upper arm as time allowed working from lateral, medial to lateral and lateral again, then redirecting along pathways (avoiding surgical site) Compression bandaging (x 2 today, first time for instruction): cocoa butter from hand to axilla, thick stockinette, elastomull x 1.5 rolls to digits 1-5, artiflex from hand to just below incision, 1 6cm  bandage at hand and forearm, 1 10 cm spiral with "X" at elbow from mid wrist to axilla avoiding incision then folded TG soft over  03/19/2023: Manual lymph drainage in supine as follows: short neck, right axillary nodes, 5 diaphragmatic breaths, anterior inter-axillary anastamoses, Left upper extremity from fingers and dorsal hand to lateral shoulder redirecting along pathways (avoiding surgical site) Compression bandaging: cocoa butter from hand to axilla, thick stockinette, elastomull to digits 1-5, rosidal from hand to just below incision, 1 6cm bandage at hand and wrist, 1 10 cm from mid forearm to axilla avoiding incision then folded TG soft over  03/15/23 Brief eval performed to have time for treatment due to severity of edema Education on options: CDT with velcro vs bandaging vs compression garments and how those would be very hard to put on at this time.   Discussed with Dwaine Gale PT, and valerie rosenberger PTA about best options and we will be able to start bandaging with addition of some time slots.  Applied lotion, tg soft size medium, fingers 1-5 with mollelast, artiflex x 1 from hand to axilla with extra padding at elbow, 1 6cm hand into spiral up forearm, and 1 10 cm from mid forearm to axilla avoiding incision. TG soft with a large fold comfort.   Education on removing bandages if they get uncomfortable and if they start to slide down and that they won't stay on until Monday most likely.   Pt will bring back all items and use TG soft as able.  Encouraged active wrist and elbow movements.  Assisted pt to donn clothes and sling.    PATIENT EDUCATION:  Education details: per today's note Person educated: Patient Education method: Programmer, multimedia, Demonstration, and Verbal cues Education comprehension: verbalized understanding  HOME EXERCISE PROGRAM: none  ASSESSMENT:  CLINICAL IMPRESSION: Left UE with significant reduction in swelling (3-4 cm) since beginning treatment. She  has responded well and tolerated bandaging fairly well. There is some mild fibrosis around her proximal forearm and elbow but not significant. She will benefit from continued PT to further reduce her swelling and assist with getting her independent with self-care. She is also cleared as of tomorrow to begin [facility] and some ARM for her left shoulder (see precautions above).  OBJECTIVE IMPAIRMENTS: decreased activity tolerance, decreased knowledge of  condition, decreased knowledge of use of DME, decreased mobility, decreased ROM, and increased edema.   ACTIVITY LIMITATIONS: carrying, lifting, sleeping, bathing, dressing, and reach over head  PARTICIPATION LIMITATIONS: meal prep, cleaning, laundry, shopping, and community activity  PERSONAL FACTORS: Age and 1 comorbidity: ALND and radiation hx  are also affecting patient's functional outcome.   REHAB POTENTIAL: Excellent  CLINICAL DECISION MAKING: Evolving/moderate complexity  EVALUATION COMPLEXITY: Moderate  GOALS: Goals reviewed with patient? Yes  SHORT TERM GOALS: Target date: 04/08/23    Pt will be ind with self MLD for edema reduction Baseline: Goal status: INITIAL  2.  Pt will decrease volume to or less to demonstrate improving edema status Baseline:  Goal status: INITIAL  3.  Pt will be evaluated and treated for shoulder with goals added as needed as instructed by MD during future visits.  Baseline:  Goal status: INITIAL   LONG TERM GOALS: Target date: 05/10/23  Pt will reduce to less than volume difference between sides to demonstrate no active lymphedema Baseline:  Goal status: INITIAL  2.  Pt will obtain compression as needed for discharge depending on reduction Baseline:  Goal status: INITIAL  PLAN:  PT FREQUENCY: 3x/week  PT DURATION: 8 weeks  PLANNED INTERVENTIONS: 97164- PT Re-evaluation, 97110-Therapeutic exercises, 97530- Therapeutic activity, 97112- Neuromuscular re-education, 97535- Self  Care, and 16109- Manual therapy  PLAN FOR NEXT SESSION: Continue complete decongestive therapy and begin AAROM and AROM left shoulder as per protocol.  Bethann Punches, [place] 03/26/23 2:10 PM

## 2023-03-27 ENCOUNTER — Other Ambulatory Visit (HOSPITAL_COMMUNITY): Payer: Self-pay | Admitting: Internal Medicine

## 2023-03-27 ENCOUNTER — Encounter (HOSPITAL_COMMUNITY): Payer: Medicare PPO

## 2023-03-27 DIAGNOSIS — E785 Hyperlipidemia, unspecified: Secondary | ICD-10-CM | POA: Diagnosis not present

## 2023-03-27 DIAGNOSIS — R600 Localized edema: Secondary | ICD-10-CM | POA: Diagnosis not present

## 2023-03-27 DIAGNOSIS — E041 Nontoxic single thyroid nodule: Secondary | ICD-10-CM | POA: Diagnosis not present

## 2023-03-27 DIAGNOSIS — R7301 Impaired fasting glucose: Secondary | ICD-10-CM | POA: Diagnosis not present

## 2023-03-27 DIAGNOSIS — G64 Other disorders of peripheral nervous system: Secondary | ICD-10-CM | POA: Diagnosis not present

## 2023-03-27 DIAGNOSIS — Z713 Dietary counseling and surveillance: Secondary | ICD-10-CM | POA: Diagnosis not present

## 2023-03-27 DIAGNOSIS — I10 Essential (primary) hypertension: Secondary | ICD-10-CM | POA: Diagnosis not present

## 2023-03-28 ENCOUNTER — Ambulatory Visit: Payer: Medicare PPO

## 2023-03-28 DIAGNOSIS — I972 Postmastectomy lymphedema syndrome: Secondary | ICD-10-CM | POA: Diagnosis not present

## 2023-03-28 DIAGNOSIS — Z96612 Presence of left artificial shoulder joint: Secondary | ICD-10-CM | POA: Diagnosis not present

## 2023-03-28 NOTE — Therapy (Signed)
OUTPATIENT PT UPPER EXTREMITY LYMPHEDEMA TREATMENT  Patient Name: Sarah Underwood MRN: [id number] DOB:06-27-1945, 78 y.o., female Today's Date: 03/28/2023  END OF SESSION:  PT End of Session - 03/28/23 1217     Visit Number 5    Number of Visits 25    Date for PT Re-Evaluation 05/10/23    Authorization Type [employer] Auth needed    Authorization Time Period 12 visits approved through 04/17/2023    PT Start Time 1211    PT Stop Time 1313    PT Time Calculation (min) 62 min    Activity Tolerance Patient tolerated treatment well    Behavior During Therapy [facility] for tasks assessed/performed               Past Medical History:  Diagnosis Date   Basal cell carcinoma 1994   right side of node   Benign positional vertigo    with rolling over    Celiac disease    Dysrhythmia    Generalized anxiety disorder    GERD (gastroesophageal reflux disease)    Graves disease 2007   thyroid nodule   Hard of hearing    Heart murmur    History of breast cancer 1995   left - treated with mastectomy   History of radiation therapy 2001   37 sessions following recurrence of breast cancer in left chest wall   Hypertension    Knee pain, bilateral 12/14/2014   Major depressive disorder in remission    Mild persistent asthma, uncomplicated 06/29/2015   Nocturia    OA (osteoarthritis) of hip 09/08/2015   Ovarian mass    small solid nodule 8x85mm: Right ovary - stable since 2000, follow u/s do not show this.   Renal mass 2021   followed by Dr. Berneice Heinrich, [facility].  Having every 6 month follow up   Seasonal allergies    Stress fracture of metatarsal bone of left foot 12/15/2019   Vegetarian diet    Past Surgical History:  Procedure Laterality Date   APPENDECTOMY     basal cell removal  7/94   right nose   BREAST BIOPSY Right 1987   benign fibroadenoma   breast cancer recurrence Left 01/2000   at site of left chest wall after previous mastectomy with removal and treatment with radiaton.    CARPAL TUNNEL RELEASE Right 05/08/2013   Procedure: RIGHT CARPAL TUNNEL RELEASE;  Surgeon: Wyn Forster., MD;  Location: [facility];  Service: Orthopedics;  Laterality: Right;   CATARACT EXTRACTION     03/20/14 left eye, 12/15 right eye   COLPOSCOPY W/ BIOPSY / CURETTAGE  03/12/12   benign, but + HR HPV   EYE SURGERY     bilat cataract surgery    MASTECTOMY Left 3/95   Stage IIb 0/14 LN ER/ PR + with mastectomy - Dr. Mardella Layman   MOHS SURGERY     right nare   OVARY SURGERY  1974   lt   REVERSE SHOULDER ARTHROPLASTY Left 02/26/2023   Procedure: REVERSE SHOULDER ARTHROPLASTY;  Surgeon: Yolonda Kida, MD;  Location: [facility] OR;  Service: Orthopedics;  Laterality: Left;   TONSILLECTOMY AND ADENOIDECTOMY  1950   TOTAL HIP ARTHROPLASTY Right 09/08/2015   Procedure: TOTAL HIP ARTHROPLASTY ANTERIOR APPROACH;  Surgeon: Ollen Gross, MD;  Location: [facility] ORS;  Service: Orthopedics;  Laterality: Right;   Patient Active Problem List   Diagnosis Date Noted   Shoulder fracture 02/23/2023   Closed fracture of proximal end of left humerus,  unspecified fracture morphology, initial encounter 02/22/2023   Thyroid nodule 02/22/2023   Major depressive disorder in remission 03/29/2020   Generalized anxiety disorder    Benign positional vertigo    Hypertension    Heart murmur    Stress fracture of metatarsal bone of left foot 12/15/2019   History of revision of total replacement of right hip joint 11/26/2017   Leg edema, left 09/26/2016   Post-nasal drainage 03/30/2016   OA (osteoarthritis) of hip 09/08/2015   Mild persistent asthma, uncomplicated 06/29/2015   Hip pain, bilateral 12/14/2014   Knee pain, bilateral 12/14/2014   Postmenopausal atrophic vaginitis 02/23/2013   Graves disease 2007   History of radiation therapy 2001   History of breast cancer 1995   Basal cell carcinoma 1994    PCP: Alysia Penna, MD  REFERRING PROVIDER: Thornton Papas, MD  REFERRING DIAG:   Diagnosis  C50.511,Z17.0 (ICD-10-CM) - Malignant neoplasm of lower-outer quadrant of right breast of female, estrogen receptor positive (HCC)  I89.0 (ICD-10-CM) - Lymphedema    THERAPY DIAG:  Postmastectomy lymphedema  S/P reverse total shoulder arthroplasty, left  ONSET DATE: 02/26/23  Rationale for Evaluation and Treatment: Rehabilitation  SUBJECTIVE                                                                                                                                                                                           SUBJECTIVE STATEMENT:  The extra bandage felt fine on my shoulder. The finger bandages fell off so we just cut those off. I have an appt at [facility] tomorrow to be measured for the [facility] and a compression glove.  PERTINENT HISTORY: Recent fall with left arm fracture.  Surgery with Reverse TSR 02/26/23.  This is the same side as her mastectomy which was diagnosed in 1995 with chest wall recurrence in 2001.  No remaining lymph nodes.   Shoulder surgery: Dr. Duwayne Heck -See media section for post-op approved exercises and okay for compression therapy.    PAIN:  Are you having pain? No   PRECAUTIONS: Can start AROM/AAROM at 03/27/23 and PROM at 2 months (04/23/23), use sling at night until 03/27/23; Can begin passive forward flexion to 90 degrees, scapular retractions, table slides, limiteD ER to neurtral until 4 weeks post op. Starting week 4 post op she can begin all active and AAROM as tolerated and consider adding foam to bandage at that time for fibrosis at forearm. No PROM until 2 months post op. She can discontinue the sling during the day, use the sling at night for 2 more weeks.   RED FLAGS: None   WEIGHT BEARING  RESTRICTIONS: No  FALLS:  Has patient fallen in last 6 months? Yes. Number of falls 1 - does not remember details of her fall. Just remembers she was going outside.   LIVING ENVIRONMENT: Lives with: lives alone Has a CNA  for assistance at home.   OCCUPATION: Retired  LEISURE: Not currently exercising  HAND DOMINANCE : Right  PRIOR LEVEL OF FUNCTION: Independent  PATIENT GOALS: prevent chronic lymphedema after this fall   OBJECTIVE (FROM EVAL) Note: Objective measures were completed at Evaluation unless otherwise noted.  COGNITION:  Overall cognitive status: Within functional limits for tasks assessed   PALPATION: +2 pitting edema back of hand, forearm, below, to around the distal 1/3rd of the upper arm.   OBSERVATIONS / OTHER ASSESSMENTS: bruising all fingers and hand, elbow, and upper arm from surgery.  Skin tight and shiny from edema.     POSTURE: wearing sling upon arrival and leaving  UPPER EXTREMITY AROM/PROM: Just starting wrist and elbow AROM - did not measure objectively.  No shoulder ROM allowed.   LYMPHEDEMA ASSESSMENTS:  LANDMARK RIGHT eval  At axilla 30.4  15cm proximal to olecranon 29.4  10 cm proximal to olecranon process 28.3  Olecranon process 24.7  15cm proximal to ulnar styloid 22.1  10 cm proximal to ulnar styloid process 18.7  Just proximal to ulnar styloid process 16.2  Across hand at thumb web space 19.8  At base of 2nd digit 6.4  (Blank rows = not tested)  LANDMARK LEFT  eval LEFT 03/26/2023  At axilla 33 31  15cm proximal to olecranon 32.5 29.4  10 cm proximal to olecranon process 34.5 30.2  Olecranon process 30.5 26.6  15cm proximal to ulnar styloid  28.7 25.6  10 cm proximal to ulnar styloid process 26.5 23.4  Just proximal to ulnar styloid process 18.5 17.2  Across hand at thumb web space 20.4 18.8  At base of 2nd digit 7.0 6.1  (Blank rows = not tested)  Volume (at eval):  Length 7cm Rt: Lt:   difference   PATIENT SURVEYS:  Quick Dash 68% limited   TODAY'S TREATMENT:                                                                                                                                         DATE:   03/28/23: Manual Therapy Manual lymph drainage to left UE in supine: short neck, left inguinal nodes, left axillo-inguinal anastomosis, Rt axillary nodes, Rt anterior intact thorax sequence and anterior inter-axillary anastomosis, then left UE working proximal to distal then retracing all steps from fingers to axilla redirecting along pathways. Compression bandaging to left UE: Applied cocoa butter and thick stockinette, next elastomull x 1.5 to all 5 fingers followed by 1/2" gray foam to dorsal hand/wrist, and then 1/4" gray foam to forearm with Artiflex to entire hand and arm, next 6 cm to hand, 10 cm  spiral with "X" at elbow, and 12 cm spiral Comprilan compression bandages from wrist to axilla.  03/26/2023: Manual therapy Manual lymph drainage to left UE in supine: short neck, right axillary and left inguinal nodes, anterior inter-axillary and anterior inter-axillary pathways. Left UE from fingers to axilla redirecting along pathways. Measured Left UE; see above for circumferential measurements. Compression bandaging to left UE: Applied Eucerin lotion and thick stockinette. Applied elastomull to all 5 fingers followed by Artiflex to entire hand and arm. Applied 6 cm, 10 cm, and 12 cm Comprilan compression bandages from hand to axilla. No c/o discomfort. Discussed while doing treatment the need for a plan for her holiday travels to the [place]. We discussed various options and because of her recent shoulder replacement, we decided on an arm Farrow wrap and a compression glove. We discussed different compression amounts and decided on 20-30 mmHg knowing that is less than typically recommended but because of her recent surgery and the need to don these independently, we decided this made the most sense. Gave the pt information about getting those ordered from [facility] and told her we could help order them online if they are unable to help. She plans to go there tomorrow.   03/21/23: Manual  Therapy Spent beginning of session instructing in pt and CNA in bandaging with demonstration and CNA videorecorded with phone with pts permission. Manual lymph drainage in supine as follows (shortened as time allowed): short neck, superficial and deep abdominals, right axillary nodes, right intact anterior thorax, anterior inter-axillary anastamoses, left inguinal nodes, left axillo-inguinal anastomosis, then only left upper arm as time allowed working from lateral, medial to lateral and lateral again, then redirecting along pathways (avoiding surgical site) Compression bandaging (x 2 today, first time for instruction): cocoa butter from hand to axilla, thick stockinette, elastomull x 1.5 rolls to digits 1-5, artiflex from hand to just below incision, 1 6cm bandage at hand and forearm, 1 10 cm spiral with "X" at elbow from mid wrist to axilla avoiding incision then folded TG soft over  03/19/2023: Manual lymph drainage in supine as follows: short neck, right axillary nodes, 5 diaphragmatic breaths, anterior inter-axillary anastamoses, Left upper extremity from fingers and dorsal hand to lateral shoulder redirecting along pathways (avoiding surgical site) Compression bandaging: cocoa butter from hand to axilla, thick stockinette, elastomull to digits 1-5, rosidal from hand to just below incision, 1 6cm bandage at hand and wrist, 1 10 cm from mid forearm to axilla avoiding incision then folded TG soft over  03/15/23 Brief eval performed to have time for treatment due to severity of edema Education on options: CDT with velcro vs bandaging vs compression garments and how those would be very hard to put on at this time.   Discussed with Dwaine Gale PT, and Kazi Reppond PTA about best options and we will be able to start bandaging with addition of some time slots.  Applied lotion, tg soft size medium, fingers 1-5 with mollelast, artiflex x 1 from hand to axilla with extra padding at elbow, 1 6cm hand  into spiral up forearm, and 1 10 cm from mid forearm to axilla avoiding incision. TG soft with a large fold comfort.   Education on removing bandages if they get uncomfortable and if they start to slide down and that they won't stay on until Monday most likely.   Pt will bring back all items and use TG soft as able.  Encouraged active wrist and elbow movements.  Assisted pt to  donn clothes and sling.    PATIENT EDUCATION:  Education details: per today's note Person educated: Patient Education method: Programmer, multimedia, Demonstration, and Verbal cues Education comprehension: verbalized understanding  HOME EXERCISE PROGRAM: none  ASSESSMENT:  CLINICAL IMPRESSION: Pt is pleased with reductions thus far and tolerated third bandage well without pain in Lt shoulder. Due to fibrosis at proximal forearm added compression foam to dorsal hand and forearm to help further soften this. Pt will be back for next session tomorrow morning so will be able to assess tolerance to foam. Pt is going to [facility] tomorrow to see about ordering a Renee Pain and compression glove after her appt here.   OBJECTIVE IMPAIRMENTS: decreased activity tolerance, decreased knowledge of condition, decreased knowledge of use of DME, decreased mobility, decreased ROM, and increased edema.   ACTIVITY LIMITATIONS: carrying, lifting, sleeping, bathing, dressing, and reach over head  PARTICIPATION LIMITATIONS: meal prep, cleaning, laundry, shopping, and community activity  PERSONAL FACTORS: Age and 1 comorbidity: ALND and radiation hx  are also affecting patient's functional outcome.   REHAB POTENTIAL: Excellent  CLINICAL DECISION MAKING: Evolving/moderate complexity  EVALUATION COMPLEXITY: Moderate  GOALS: Goals reviewed with patient? Yes  SHORT TERM GOALS: Target date: 04/08/23    Pt will be ind with self MLD for edema reduction Baseline: Goal status: INITIAL  2.  Pt will decrease volume to or less to  demonstrate improving edema status Baseline:  Goal status: INITIAL  3.  Pt will be evaluated and treated for shoulder with goals added as needed as instructed by MD during future visits.  Baseline:  Goal status: INITIAL   LONG TERM GOALS: Target date: 05/10/23  Pt will reduce to less than volume difference between sides to demonstrate no active lymphedema Baseline:  Goal status: INITIAL  2.  Pt will obtain compression as needed for discharge depending on reduction Baseline:  Goal status: INITIAL  PLAN:  PT FREQUENCY: 3x/week  PT DURATION: 8 weeks  PLANNED INTERVENTIONS: 97164- PT Re-evaluation, 97110-Therapeutic exercises, 97530- Therapeutic activity, 97112- Neuromuscular re-education, 97535- Self Care, and 59563- Manual therapy  PLAN FOR NEXT SESSION: How did pt tolerate compression foam? Continue complete decongestive therapy and begin AAROM and AROM left shoulder as per protocol; next appt with surgeon is 12/17.  Berna Spare, PTA 03/28/23 1:24 PM

## 2023-03-28 NOTE — Progress Notes (Signed)
Initial neurology clinic note  Reason for Evaluation: Consultation requested by Alysia Penna, MD for an opinion regarding pain in legs. My final recommendations will be communicated back to the requesting physician by way of shared medical record or letter to requesting physician via [place] mail.  HPI: This is [name], a 77 y.o. right-handed female with a medical history of HTN, HLD, pre-DM, asthma, OA, vit D deficiency, breast cancer (s/p left mastectomy), celiac disease, gallstones, hearing loss who presents to neurology clinic with the chief complaint of pain in legs. The patient is alone today.  Patient is having pain in her legs, described as burning. She has a history of pre-DM and celiac disease, so there was concern for neuropathy. She has had symptoms for years. It was originally burning and tingling in both feet. Over time, it has spread into the legs and maybe is more prominent on the right now. She feels there is poor sensation in her feet. She had a recent fall, 02/22/23. She was loading her car and fell while going into the house. She does not remember how she fell. She noticed a box with all these cards spread on the floor. She felt her arm disconnect from her shoulder. She called 911. She does not remember much after that. She had broken her left humerus. She had surgery on 02/26/23. She is doing PT with lymphedema clinic (as this had significant swelling after her fall).  She endorses back pain.  Prior to her fall, she felt like she was in fairly decent shape and strength as she had been going to a [employer].  She mentions chronic knee swelling for which she sees [facility] as well.  Patient also has a history of left breast cancer. She had a left mastectomy (1995), with recurrence in 2001, with radiation in 2002. She never had chemotherapy. She is currently on Arimidex.  Of note, patient was previously seen in this office for memory. Neuropsych testing was  normal. Anxiety and sleep dysfunction were felt to be cause. This is not currently an issue for patient.  The patient denies symptoms suggestive of oculobulbar weakness including diplopia, ptosis, dysphagia, poor saliva control, dysarthria/dysphonia, impaired mastication, facial weakness/droop. She does endorse chronic dry eyes, but no dry mouth. She has celiac, but since changing diet she has not had GI issues.  She report any constitutional symptoms like fever, night sweats, anorexia or unintentional weight loss. She did lose 11 lbs while in the hospital.  EtOH use: Very rare  Restrictive diet? Gluten free Family history of neuropathy/myopathy/neurologic disease? No, but daughter has Lupus and mother probably also did   MEDICATIONS:  Outpatient Encounter Medications as of 04/05/2023  Medication Sig Note   ibuprofen (ADVIL) 200 MG tablet Take 400 mg by mouth every 6 (six) hours as needed.    acetaminophen (TYLENOL) 650 MG CR tablet Take 1,300 mg by mouth every 8 (eight) hours as needed for pain.    albuterol (PROVENTIL HFA;VENTOLIN HFA) 108 (90 Base) MCG/ACT inhaler Inhale 2 puffs into the lungs every 4 (four) hours as needed for wheezing.    amLODipine (NORVASC) 2.5 MG tablet Take 1 tablet by mouth daily.    anastrozole (ARIMIDEX) 1 MG tablet TAKE 1 TABLET BY MOUTH EVERY DAY    Artificial Tear Solution (TEARS NATURALE OP) Place 2 drops into both eyes daily as needed (For dry eyes.).    Biotin 5000 MCG SUBL Take 1 tablet by mouth See admin instructions.  budesonide-formoterol (SYMBICORT) 80-4.5 MCG/ACT inhaler Inhale 2 puffs into the lungs 2 (two) times daily.    Cetirizine HCl (ZYRTEC ALLERGY PO) Take 1 tablet by mouth daily as needed (for allergies).    docusate sodium (COLACE) 100 MG capsule Take 1 capsule (100 mg total) by mouth 2 (two) times daily.    EPINEPHrine 0.3 mg/0.3 mL IJ SOAJ injection See admin instructions. 02/23/2023: No dispense record in the past 12 months   ketotifen  (ZADITOR) 0.025 % ophthalmic solution Place 1 drop into both eyes daily.    MAGNESIUM CITRATE PO Take 1 tablet by mouth 2 (two) times daily.    Menaquinone-7 (VITAMIN K2) 100 MCG CAPS Take 1 capsule by mouth daily.    metoprolol succinate (TOPROL-XL) 50 MG 24 hr tablet TAKE 1 TABLET BY MOUTH EVERY DAY WITH OR IMMEDIATELY FOLLOWING A MEAL    olmesartan (BENICAR) 40 MG tablet Take 40 mg by mouth daily.    oxyCODONE (ROXICODONE) 5 MG immediate release tablet Take 1 tablet (5 mg total) by mouth every 4 (four) hours as needed.    polyethylene glycol (MIRALAX / GLYCOLAX) 17 g packet Take 17 g by mouth daily as needed for mild constipation.    simvastatin (ZOCOR) 20 MG tablet Take 20 mg by mouth. Take one tablet by mouth on Monday, Wed and Fri    triamcinolone ointment (KENALOG) 0.5 % Apply 1 Application topically 2 (two) times daily. Use as needed.  Don't use for more than 5 days continuously.    VITAMIN D PO Take 4,000 Int'l Units by mouth.    No facility-administered encounter medications on file as of 04/05/2023.    PAST MEDICAL HISTORY: Past Medical History:  Diagnosis Date   Basal cell carcinoma 1994   right side of node   Benign positional vertigo    with rolling over    Celiac disease    Dysrhythmia    Generalized anxiety disorder    GERD (gastroesophageal reflux disease)    Graves disease 2007   thyroid nodule   Hard of hearing    Heart murmur    History of breast cancer 1995   left - treated with mastectomy   History of radiation therapy 2001   37 sessions following recurrence of breast cancer in left chest wall   Hypertension    Knee pain, bilateral 12/14/2014   Major depressive disorder in remission    Mild persistent asthma, uncomplicated 06/29/2015   Nocturia    OA (osteoarthritis) of hip 09/08/2015   Ovarian mass    small solid nodule 8x77mm: Right ovary - stable since 2000, follow u/s do not show this.   Renal mass 2021   followed by Dr. Berneice Heinrich, [facility].  Having  every 6 month follow up   Seasonal allergies    Stress fracture of metatarsal bone of left foot 12/15/2019   Vegetarian diet     PAST SURGICAL HISTORY: Past Surgical History:  Procedure Laterality Date   APPENDECTOMY     basal cell removal  7/94   right nose   BREAST BIOPSY Right 1987   benign fibroadenoma   breast cancer recurrence Left 01/2000   at site of left chest wall after previous mastectomy with removal and treatment with radiaton.   CARPAL TUNNEL RELEASE Right 05/08/2013   Procedure: RIGHT CARPAL TUNNEL RELEASE;  Surgeon: Wyn Forster., MD;  Location: [facility];  Service: Orthopedics;  Laterality: Right;   CATARACT EXTRACTION     03/20/14 left eye,  12/15 right eye   COLPOSCOPY W/ BIOPSY / CURETTAGE  03/12/12   benign, but + HR HPV   EYE SURGERY     bilat cataract surgery    MASTECTOMY Left 3/95   Stage IIb 0/14 LN ER/ PR + with mastectomy - Dr. Mardella Layman   MOHS SURGERY     right nare   OVARY SURGERY  1974   lt   REVERSE SHOULDER ARTHROPLASTY Left 02/26/2023   Procedure: REVERSE SHOULDER ARTHROPLASTY;  Surgeon: Yolonda Kida, MD;  Location: [facility] OR;  Service: Orthopedics;  Laterality: Left;   TONSILLECTOMY AND ADENOIDECTOMY  1950   TOTAL HIP ARTHROPLASTY Right 09/08/2015   Procedure: TOTAL HIP ARTHROPLASTY ANTERIOR APPROACH;  Surgeon: Ollen Gross, MD;  Location: [facility]S;  Service: Orthopedics;  Laterality: Right;    ALLERGIES: Allergies  Allergen Reactions   Other Swelling    Trees   Molds & Smuts Other (See Comments)   Erythromycin Nausea And Vomiting   Penicillins Hives and Other (See Comments)    Has patient had a PCN reaction causing immediate rash, facial/tongue/throat swelling, SOB or lightheadedness with hypotension: no Has patient had a PCN reaction causing severe rash involving mucus membranes or skin necrosis: no Has patient had a PCN reaction that required hospitalization no Has patient had a PCN reaction occurring within the  last 10 years: no If all of the above answers are "NO", then may proceed with Cephalosporin use.    Terbinafine And Related     rash   Hydrochlorothiazide Rash    Pt states caused a sunburn type reaction    Sulfa Antibiotics Rash    FAMILY HISTORY: Family History  Problem Relation Age of Onset   Breast cancer Mother 78       deceased 63   Osteoporosis Mother    Hypertension Mother    Dementia Mother        unspecified type   Mental illness Mother    Hypertension Father    Heart attack Father    Dementia Father        vascular dementia with history of "mini-strokes"   Breast cancer Sister 71       bilateral; recurrent 12 years late   Diabetes Brother    Lung cancer Maternal Grandfather        deceased 71   Cancer Paternal Grandmother        duodenal; deceased 66    SOCIAL HISTORY: Social History   Tobacco Use   Smoking status: Never   Smokeless tobacco: Never  Vaping Use   Vaping status: Never Used  Substance Use Topics   Alcohol use: Yes    Comment: rare   Drug use: No   Social History   Social History Narrative   Right handed   Lives alone      Are you currently employed ?    What is your current occupation?retired   Do you live at home alone?   What type of home do you live in: 1 story or 2 story? two   Caffiene 1 cup      OBJECTIVE: PHYSICAL EXAM: BP (!) 160/82   Pulse 78   Ht 5\' 2"  (1.575 m)   Wt 150 lb (68 kg)   LMP 04/25/2003   SpO2 97%   BMI 27.44 kg/m   General: General appearance: Awake and alert. No distress. Cooperative with exam.  Skin: No obvious rash or jaundice. HEENT: Atraumatic. Anicteric. Lungs: Non-labored breathing on room air  Extremities:  LUE wrapped due to recent fracture Psych: Affect appropriate.  Neurological: Mental Status: Alert. Speech fluent. No pseudobulbar affect Cranial Nerves: CNII: No RAPD. Visual fields grossly intact. CNIII, IV, VI: PERRL. No nystagmus. EOMI. CN V: Facial sensation intact  bilaterally to fine touch. CN VII: Facial muscles symmetric and strong. No ptosis at rest. CN VIII: Hearing grossly intact bilaterally. CN IX: No hypophonia. CN X: Palate elevates symmetrically. CN XI: Full strength shoulder shrug bilaterally. CN XII: Tongue protrusion full and midline. No atrophy or fasciculations. No significant dysarthria Motor: Tone is normal (LUE not tested).  Individual muscle group testing (MRC grade out of 5): LUE not tested due to fracture, casting.  Movement     Neck flexion 5    Neck extension 5     Right Left   Shoulder abduction 5 ---   Elbow flexion 5 ---   Elbow extension 5 ---   Finger abduction - FDI 5 ---   Finger abduction - ADM 5 ---   Finger extension 5 ---   Finger distal flexion - 2/3 5 ---   Finger distal flexion - 4/5 5 ---   Thumb flexion - FPL 5 ---   Thumb abduction - APB 4- ---    Hip flexion 4 5   Hip extension 5 5   Hip adduction 5 5   Hip abduction 5 5   Knee extension 5 5   Knee flexion 5 5   Dorsiflexion 5 5   Plantarflexion 5 5   Inversion 5 5   Eversion 5 5     Reflexes:  Right Left   Bicep 2+ ---   Tricep 2+ ---   BrRad 2+ ---   Knee 2+ 2+   Ankle Trace Trace    Pathological Reflexes: Babinski: mute response bilaterally Hoffman: absent bilaterally Troemner: absent bilaterally Sensation: Pinprick: Intact in RUE, Diminished in bilateral lower extremities (R >> L) in distal to proximal gradient Vibration: Intact in RUE, Absent in bilateral great toes, diminished in ankles, present in patella Proprioception: Intact in bilateral great toes Coordination: Intact finger-to- nose-finger bilaterally. Romberg positive. Gait: Able to rise from chair with arms crossed unassisted. Normal, narrow-based gait.  Lab and Test Review: Internal labs: 02/26/23: BMP significant for glucose 118, Cr 1.05, Na 133 (chronic) CBC w/ diff significant for Hb 11.7 (chronic), MCV 89.2  External labs: 12/04/22: Vit D wnl HbA1c:  6.0 TSH wnl Lipid panel: tChol 160,, LDL 72, TG 57 B12: 498 T-transglutaminas IgA elevated (6)  Imaging: CT head and CTA head and neck (02/22/23): FINDINGS: CT HEAD FINDINGS   Limitations: Note that the presence of iodinated contrast material limits the ability to assess for intracranial hemorrhage.   Brain: No evidence of acute infarction, hemorrhage, hydrocephalus, extra-axial collection or mass lesion/mass effect.   Vascular: No hyperdense vessel or unexpected calcification.   Skull: Normal. Negative for fracture or focal lesion.   Sinuses/Orbits: No middle ear or mastoid effusion. Paranasal sinuses are clear. Bilateral lens replacement. Orbits are otherwise unremarkable   Other: None.   Review of the MIP images confirms the above findings   CTA NECK FINDINGS   Aortic arch: Standard branching. Imaged portion shows no evidence of aneurysm or dissection. No significant stenosis of the major arch vessel origins.   Right carotid system: No evidence of dissection, stenosis (50% or greater), or occlusion.   Left carotid system: No evidence of dissection, stenosis (50% or greater), or occlusion.   Vertebral arteries: Codominant. No evidence of dissection,  stenosis (50% or greater), or occlusion.   Skeleton: Negative.   Other neck: Heterogeneous and multinodular thyroid gland with a dominant nodule in the thyroid isthmus measuring up to 1.9 cm.   Upper chest: Negative.   Review of the MIP images confirms the above findings   CTA HEAD FINDINGS   Anterior circulation: No significant stenosis, proximal occlusion, aneurysm, or vascular malformation.   Posterior circulation: No significant stenosis, proximal occlusion, aneurysm, or vascular malformation.   Venous sinuses: As permitted by contrast timing, patent.   Anatomic variants: None   Review of the MIP images confirms the above findings   CERVICAL SPINE FINDINGS   Alignment: Grade 1 anterolisthesis of  C2 on C3. Trace anterolisthesis of C7 on T1.   Skull base and vertebrae: No acute fracture. No primary bone lesion or focal pathologic process.   Soft tissues and spinal canal: No prevertebral fluid or swelling. No visible canal hematoma.   Disc levels:  No CT evidence of high-grade spinal canal stenosis.   Upper chest: Negative.   Other: None.   IMPRESSION: 1. Note that the presence of iodinated contrast markedly limits the ability to assess for intracranial hemorrhage within this limitation, no evidence of an extra-axial fluid collection. 2. No emergent large vessel occlusion. 3. No acute fracture or traumatic listhesis of the cervical spine. 4. Heterogeneous and multinodular thyroid gland with a dominant nodule in the thyroid isthmus measuring up to 1.9 cm. Recommend further evaluation with a nonemergent dedicated thyroid ultrasound if not previously performed.  MRI brain w/wo contrast (03/10/20): FINDINGS: Brain: No acute infarction, hemorrhage, hydrocephalus, extra-axial collection or mass lesion. Scattered T2/FLAIR hyperintensities within the white matter, most likely related to chronic microvascular ischemic disease and mild for age. Mild generalized cerebral atrophy. Dilated perivascular spaces in bilateral inferior basal ganglia. No abnormal enhancement.   Vascular: Major arterial flow voids are maintained at the skull base.   Skull and upper cervical spine: Normal marrow signal.   Sinuses/Orbits: Negative.   Other: Trace bilateral mastoid effusions.   IMPRESSION: No acute intracranial abnormality.  ASSESSMENT: Sarah Underwood is a 77 y.o. female who presents for evaluation of numbness and burning in legs, imbalance, and recent fall. She has a relevant medical history of HTN, HLD, pre-DM, asthma, OA, vit D deficiency, breast cancer (s/p left mastectomy), celiac disease, gallstones, hearing loss. Her neurological examination is pertinent for diminished  sensation in bilateral lower extremities with distal to proximal gradient. There is some asymmetry, with symptoms worse on the right than the left and weakness of right thigh flexion that is not present on the left. Symptoms are consistent with polyneuropathy, but the asymmetry is concerning for an overlapping process such as lumbosacral radiculopathy (?right L3). Her known risk factors for neuropathy include pre-DM and celiac disease. I will get labs to look for other treatable causes and get an MRI to evaluate for radiculopathy. EMG would be less helpful given patient's age and that sensory responses may be normally absent after the age of 27.  PLAN: -Blood work: B1, B6, IFE -MRI lumbar spine wo contrast -Lidocaine cream PRN -Alpha lipoic acid 600 mg once or twice daily -Foot care and inspection discussed -Continue PT  -Return to clinic in 6 months  The impression above as well as the plan as outlined below were extensively discussed with the patient who voiced understanding. All questions were answered to their satisfaction.  The patient was counseled on pertinent fall precautions per the printed material provided today, and as  noted under the "Patient Instructions" section below.  When available, results of the above investigations and possible further recommendations will be communicated to the patient via telephone/MyChart. Patient to call office if not contacted after expected testing turnaround time.   Total time spent reviewing records, interview, history/exam, documentation, and coordination of care on day of encounter:  50 min   Thank you for allowing me to participate in patient's care.  If I can answer any additional questions, I would be pleased to do so.  Jacquelyne Balint, MD   CC: Alysia Penna, MD [address] [place] [address]  CC: Referring provider: Alysia Penna, MD [address] [place],  [place] [address]

## 2023-03-29 ENCOUNTER — Other Ambulatory Visit (HOSPITAL_COMMUNITY): Payer: Self-pay | Admitting: Internal Medicine

## 2023-03-29 ENCOUNTER — Ambulatory Visit (HOSPITAL_COMMUNITY)
Admission: RE | Admit: 2023-03-29 | Discharge: 2023-03-29 | Disposition: A | Discharge status: Home / Self Care | Payer: Medicare PPO | Source: Ambulatory Visit | Attending: Vascular Surgery | Admitting: Vascular Surgery

## 2023-03-29 ENCOUNTER — Ambulatory Visit: Payer: Medicare PPO

## 2023-03-29 DIAGNOSIS — R600 Localized edema: Secondary | ICD-10-CM | POA: Insufficient documentation

## 2023-03-29 DIAGNOSIS — Z96612 Presence of left artificial shoulder joint: Secondary | ICD-10-CM

## 2023-03-29 DIAGNOSIS — I972 Postmastectomy lymphedema syndrome: Secondary | ICD-10-CM | POA: Diagnosis not present

## 2023-03-29 NOTE — Therapy (Signed)
OUTPATIENT PT UPPER EXTREMITY LYMPHEDEMA TREATMENT  Patient Name: Sarah Underwood MRN: [id number] DOB:[birth year], 77 y.o., female Today's Date: 03/29/2023  END OF SESSION:  PT End of Session - 03/29/23 0757     Visit Number 6    Number of Visits 25    Date for PT Re-Evaluation 05/10/23    Authorization Type [employer] Auth needed    Authorization Time Period 12 visits approved through 04/17/2023    PT Start Time 0800    PT Stop Time 0903    PT Time Calculation (min) 63 min    Activity Tolerance Patient tolerated treatment well    Behavior During Therapy [facility] for tasks assessed/performed               Past Medical History:  Diagnosis Date   Basal cell carcinoma 1994   right side of node   Benign positional vertigo    with rolling over    Celiac disease    Dysrhythmia    Generalized anxiety disorder    GERD (gastroesophageal reflux disease)    Graves disease 2007   thyroid nodule   Hard of hearing    Heart murmur    History of breast cancer 1995   left - treated with mastectomy   History of radiation therapy 2001   37 sessions following recurrence of breast cancer in left chest wall   Hypertension    Knee pain, bilateral 12/14/2014   Major depressive disorder in remission    Mild persistent asthma, uncomplicated 06/29/2015   Nocturia    OA (osteoarthritis) of hip 09/08/2015   Ovarian mass    small solid nodule 8x74mm: Right ovary - stable since 2000, follow u/s do not show this.   Renal mass 2021   followed by Dr. Berneice Heinrich, [facility].  Having every 6 month follow up   Seasonal allergies    Stress fracture of metatarsal bone of left foot 12/15/2019   Vegetarian diet    Past Surgical History:  Procedure Laterality Date   APPENDECTOMY     basal cell removal  7/94   right nose   BREAST BIOPSY Right 1987   benign fibroadenoma   breast cancer recurrence Left 01/2000   at site of left chest wall after previous mastectomy with removal and treatment with radiaton.    CARPAL TUNNEL RELEASE Right 05/08/2013   Procedure: RIGHT CARPAL TUNNEL RELEASE;  Surgeon: Wyn Forster., MD;  Location: [facility];  Service: Orthopedics;  Laterality: Right;   CATARACT EXTRACTION     03/20/14 left eye, 12/15 right eye   COLPOSCOPY W/ BIOPSY / CURETTAGE  03/12/12   benign, but + HR HPV   EYE SURGERY     bilat cataract surgery    MASTECTOMY Left 3/95   Stage IIb 0/14 LN ER/ PR + with mastectomy - Dr. Mardella Layman   MOHS SURGERY     right nare   OVARY SURGERY  1974   lt   REVERSE SHOULDER ARTHROPLASTY Left 02/26/2023   Procedure: REVERSE SHOULDER ARTHROPLASTY;  Surgeon: Yolonda Kida, MD;  Location: [facility];  Service: Orthopedics;  Laterality: Left;   TONSILLECTOMY AND ADENOIDECTOMY  1950   TOTAL HIP ARTHROPLASTY Right 09/08/2015   Procedure: TOTAL HIP ARTHROPLASTY ANTERIOR APPROACH;  Surgeon: Ollen Gross, MD;  Location: [facility];  Service: Orthopedics;  Laterality: Right;   Patient Active Problem List   Diagnosis Date Noted   Shoulder fracture 02/23/2023   Closed fracture of proximal end of left humerus,  unspecified fracture morphology, initial encounter 02/22/2023   Thyroid nodule 02/22/2023   Major depressive disorder in remission 03/29/2020   Generalized anxiety disorder    Benign positional vertigo    Hypertension    Heart murmur    Stress fracture of metatarsal bone of left foot 12/15/2019   History of revision of total replacement of right hip joint 11/26/2017   Leg edema, left 09/26/2016   Post-nasal drainage 03/30/2016   OA (osteoarthritis) of hip 09/08/2015   Mild persistent asthma, uncomplicated 06/29/2015   Hip pain, bilateral 12/14/2014   Knee pain, bilateral 12/14/2014   Postmenopausal atrophic vaginitis 02/23/2013   Graves disease 2007   History of radiation therapy 2001   History of breast cancer 1995   Basal cell carcinoma 1994    PCP: Alysia Penna, MD  REFERRING PROVIDER: Thornton Papas, MD  REFERRING DIAG:   Diagnosis  C50.511,Z17.0 (ICD-10-CM) - Malignant neoplasm of lower-outer quadrant of right breast of female, estrogen receptor positive (HCC)  I89.0 (ICD-10-CM) - Lymphedema    THERAPY DIAG:  Postmastectomy lymphedema  S/P reverse total shoulder arthroplasty, left  ONSET DATE: 02/26/23  Rationale for Evaluation and Treatment: Rehabilitation  SUBJECTIVE                                                                                                                                                                                           SUBJECTIVE STATEMENT:  The wrap has caused discomfort at her forearm and she thinks its thickness of the wrap. She has a doppler today due to rapid increase in swelling in her leg.  PERTINENT HISTORY: Recent fall with left arm fracture.  Surgery with Reverse TSR 02/26/23.  This is the same side as her mastectomy which was diagnosed in 1995 with chest wall recurrence in 2001.  No remaining lymph nodes.   Shoulder surgery: Dr. Duwayne Heck -See media section for post-op approved exercises and okay for compression therapy.    PAIN:  Are you having pain? No discomfort in the arm  PRECAUTIONS: Can start AROM/AAROM at 03/27/23 and PROM at 2 months (04/23/23), use sling at night until 03/27/23; Can begin passive forward flexion to 90 degrees, scapular retractions, table slides, limiteD ER to neurtral until 4 weeks post op. Starting week 4 post op she can begin all active and AAROM as tolerated and consider adding foam to bandage at that time for fibrosis at forearm. No PROM until 2 months post op. She can discontinue the sling during the day, use the sling at night for 2 more weeks.   RED FLAGS: None   WEIGHT BEARING RESTRICTIONS: No  FALLS:  Has  patient fallen in last 6 months? Yes. Number of falls 1 - does not remember details of her fall. Just remembers she was going outside.   LIVING ENVIRONMENT: Lives with: lives alone Has a CNA for assistance at home.    OCCUPATION: Retired  LEISURE: Not currently exercising  HAND DOMINANCE : Right  PRIOR LEVEL OF FUNCTION: Independent  PATIENT GOALS: prevent chronic lymphedema after this fall   OBJECTIVE (FROM EVAL) Note: Objective measures were completed at Evaluation unless otherwise noted.  COGNITION:  Overall cognitive status: Within functional limits for tasks assessed   PALPATION: +2 pitting edema back of hand, forearm, below, to around the distal 1/3rd of the upper arm.   OBSERVATIONS / OTHER ASSESSMENTS: bruising all fingers and hand, elbow, and upper arm from surgery.  Skin tight and shiny from edema.     POSTURE: wearing sling upon arrival and leaving  UPPER EXTREMITY AROM/PROM: Just starting wrist and elbow AROM - did not measure objectively.  No shoulder ROM allowed.   LYMPHEDEMA ASSESSMENTS:  LANDMARK RIGHT eval  At axilla 30.4  15cm proximal to olecranon 29.4  10 cm proximal to olecranon process 28.3  Olecranon process 24.7  15cm proximal to ulnar styloid 22.1  10 cm proximal to ulnar styloid process 18.7  Just proximal to ulnar styloid process 16.2  Across hand at thumb web space 19.8  At base of 2nd digit 6.4  (Blank rows = not tested)  LANDMARK LEFT  eval LEFT 03/26/2023  At axilla 33 31  15cm proximal to olecranon 32.5 29.4  10 cm proximal to olecranon process 34.5 30.2  Olecranon process 30.5 26.6  15cm proximal to ulnar styloid  28.7 25.6  10 cm proximal to ulnar styloid process 26.5 23.4  Just proximal to ulnar styloid process 18.5 17.2  Across hand at thumb web space 20.4 18.8  At base of 2nd digit 7.0 6.1  (Blank rows = not tested)  Volume (at eval):  Length 7cm Rt: Lt:   difference   PATIENT SURVEYS:  Quick Dash 68% limited   TODAY'S TREATMENT:                                                                                                                                         DATE:   03/29/2023 Bandages removed. Hand  looked excellent and forearm softer. Above wrap at axilla slightly more red Manual Therapy Manual lymph drainage to left UE in supine: short neck, left inguinal nodes, left axillo-inguinal anastomosis, Rt axillary nodes, Rt anterior intact thorax sequence and anterior inter-axillary anastomosis, then left UE working proximal to distal then retracing all steps from fingers to axilla redirecting along pathways. Compression bandaging to left UE: Applied cocoa butter and thick stockinette, next elastomull  to all 5 fingers followed by 1/4" gray foam to dorsal hand/wrist, and then 1/4" gray foam to forearm with Artiflex to entire hand  and arm, next 6 cm to hand, 10 cm spiral with "X" at elbow, and 12 cm spiral Comprilan compression bandages from wrist to axilla. Pt advised she can remove top wrap if bandage is uncomfortable or if it slides down and have someone else rewrap  03/28/23: Manual Therapy Manual lymph drainage to left UE in supine: short neck, left inguinal nodes, left axillo-inguinal anastomosis, Rt axillary nodes, Rt anterior intact thorax sequence and anterior inter-axillary anastomosis, then left UE working proximal to distal then retracing all steps from fingers to axilla redirecting along pathways. Compression bandaging to left UE: Applied cocoa butter and thick stockinette, next elastomull x 1.5 to all 5 fingers followed by 1/2" gray foam to dorsal hand/wrist, and then 1/4" gray foam to forearm with Artiflex to entire hand and arm, next 6 cm to hand, 10 cm spiral with "X" at elbow, and 12 cm spiral Comprilan compression bandages from wrist to axilla.  03/26/2023: Manual therapy Manual lymph drainage to left UE in supine: short neck, right axillary and left inguinal nodes, anterior inter-axillary and anterior inter-axillary pathways. Left UE from fingers to axilla redirecting along pathways. Measured Left UE; see above for circumferential measurements. Compression bandaging to left UE:  Applied Eucerin lotion and thick stockinette. Applied elastomull to all 5 fingers followed by Artiflex to entire hand and arm. Applied 6 cm, 10 cm, and 12 cm Comprilan compression bandages from hand to axilla. No c/o discomfort. Discussed while doing treatment the need for a plan for her holiday travels to the [place]. We discussed various options and because of her recent shoulder replacement, we decided on an arm Farrow wrap and a compression glove. We discussed different compression amounts and decided on 20-30 mmHg knowing that is less than typically recommended but because of her recent surgery and the need to don these independently, we decided this made the most sense. Gave the pt information about getting those ordered from [facility] and told her we could help order them online if they are unable to help. She plans to go there tomorrow.   03/21/23: Manual Therapy Spent beginning of session instructing in pt and CNA in bandaging with demonstration and CNA videorecorded with phone with pts permission. Manual lymph drainage in supine as follows (shortened as time allowed): short neck, superficial and deep abdominals, right axillary nodes, right intact anterior thorax, anterior inter-axillary anastamoses, left inguinal nodes, left axillo-inguinal anastomosis, then only left upper arm as time allowed working from lateral, medial to lateral and lateral again, then redirecting along pathways (avoiding surgical site) Compression bandaging (x 2 today, first time for instruction): cocoa butter from hand to axilla, thick stockinette, elastomull x 1.5 rolls to digits 1-5, artiflex from hand to just below incision, 1 6cm bandage at hand and forearm, 1 10 cm spiral with "X" at elbow from mid wrist to axilla avoiding incision then folded TG soft over  03/19/2023: Manual lymph drainage in supine as follows: short neck, right axillary nodes, 5 diaphragmatic breaths, anterior inter-axillary anastamoses, Left  upper extremity from fingers and dorsal hand to lateral shoulder redirecting along pathways (avoiding surgical site) Compression bandaging: cocoa butter from hand to axilla, thick stockinette, elastomull to digits 1-5, rosidal from hand to just below incision, 1 6cm bandage at hand and wrist, 1 10 cm from mid forearm to axilla avoiding incision then folded TG soft over  03/15/23 Brief eval performed to have time for treatment due to severity of edema Education on options: CDT with velcro vs bandaging  vs compression garments and how those would be very hard to put on at this time.   Discussed with Dwaine Gale PT, and valerie rosenberger PTA about best options and we will be able to start bandaging with addition of some time slots.  Applied lotion, tg soft size medium, fingers 1-5 with mollelast, artiflex x 1 from hand to axilla with extra padding at elbow, 1 6cm hand into spiral up forearm, and 1 10 cm from mid forearm to axilla avoiding incision. TG soft with a large fold comfort.   Education on removing bandages if they get uncomfortable and if they start to slide down and that they won't stay on until Monday most likely.   Pt will bring back all items and use TG soft as able.  Encouraged active wrist and elbow movements.  Assisted pt to donn clothes and sling.    PATIENT EDUCATION:  Education details: per today's note Person educated: Patient Education method: Programmer, multimedia, Demonstration, and Verbal cues Education comprehension: verbalized understanding  HOME EXERCISE PROGRAM: none  ASSESSMENT:  CLINICAL IMPRESSION: Pt had good reduction but wrap was uncomfortable for her. Used 1/4 foam at hand for less bulk, and kept wrap about 1/4 in lower at top with extra artiflex underneath. Pt going to [facility] today.    OBJECTIVE IMPAIRMENTS: decreased activity tolerance, decreased knowledge of condition, decreased knowledge of use of DME, decreased mobility, decreased ROM, and increased  edema.   ACTIVITY LIMITATIONS: carrying, lifting, sleeping, bathing, dressing, and reach over head  PARTICIPATION LIMITATIONS: meal prep, cleaning, laundry, shopping, and community activity  PERSONAL FACTORS: Age and 1 comorbidity: ALND and radiation hx  are also affecting patient's functional outcome.   REHAB POTENTIAL: Excellent  CLINICAL DECISION MAKING: Evolving/moderate complexity  EVALUATION COMPLEXITY: Moderate  GOALS: Goals reviewed with patient? Yes  SHORT TERM GOALS: Target date: 04/08/23    Pt will be ind with self MLD for edema reduction Baseline: Goal status: INITIAL  2.  Pt will decrease volume to or less to demonstrate improving edema status Baseline:  Goal status: INITIAL  3.  Pt will be evaluated and treated for shoulder with goals added as needed as instructed by MD during future visits.  Baseline:  Goal status: INITIAL   LONG TERM GOALS: Target date: 05/10/23  Pt will reduce to less than volume difference between sides to demonstrate no active lymphedema Baseline:  Goal status: INITIAL  2.  Pt will obtain compression as needed for discharge depending on reduction Baseline:  Goal status: INITIAL  PLAN:  PT FREQUENCY: 3x/week  PT DURATION: 8 weeks  PLANNED INTERVENTIONS: 97164- PT Re-evaluation, 97110-Therapeutic exercises, 97530- Therapeutic activity, 97112- Neuromuscular re-education, 97535- Self Care, and 83151- Manual therapy  PLAN FOR NEXT SESSION: How did pt tolerate compression foam? Continue complete decongestive therapy and begin AAROM and AROM left shoulder as per protocol; next appt with surgeon is 12/17.  Berna Spare, PTA 03/29/23 9:13 AM

## 2023-04-02 ENCOUNTER — Ambulatory Visit: Payer: Medicare PPO

## 2023-04-02 DIAGNOSIS — I972 Postmastectomy lymphedema syndrome: Secondary | ICD-10-CM | POA: Diagnosis not present

## 2023-04-02 DIAGNOSIS — Z96612 Presence of left artificial shoulder joint: Secondary | ICD-10-CM | POA: Diagnosis not present

## 2023-04-02 NOTE — Therapy (Signed)
OUTPATIENT PT UPPER EXTREMITY LYMPHEDEMA TREATMENT  Patient Name: Sarah Underwood MRN: [id number] DOB:[birth year], 77 y.o., female Today's Date: 04/02/2023  END OF SESSION:  PT End of Session - 04/02/23 1407     Visit Number 7    Number of Visits 25    Date for PT Re-Evaluation 05/10/23    Authorization Time Period 12 visits approved through 04/17/2023    Authorization - Visit Number 7    Authorization - Number of Visits 12    PT Start Time 1404    PT Stop Time 1502    PT Time Calculation (min) 58 min    Activity Tolerance Patient tolerated treatment well    Behavior During Therapy [facility] for tasks assessed/performed               Past Medical History:  Diagnosis Date   Basal cell carcinoma 1994   right side of node   Benign positional vertigo    with rolling over    Celiac disease    Dysrhythmia    Generalized anxiety disorder    GERD (gastroesophageal reflux disease)    Graves disease 2007   thyroid nodule   Hard of hearing    Heart murmur    History of breast cancer 1995   left - treated with mastectomy   History of radiation therapy 2001   37 sessions following recurrence of breast cancer in left chest wall   Hypertension    Knee pain, bilateral 12/14/2014   Major depressive disorder in remission    Mild persistent asthma, uncomplicated 06/29/2015   Nocturia    OA (osteoarthritis) of hip 09/08/2015   Ovarian mass    small solid nodule 8x33mm: Right ovary - stable since 2000, follow u/s do not show this.   Renal mass 2021   followed by Dr. Berneice Heinrich, [facility].  Having every 6 month follow up   Seasonal allergies    Stress fracture of metatarsal bone of left foot 12/15/2019   Vegetarian diet    Past Surgical History:  Procedure Laterality Date   APPENDECTOMY     basal cell removal  7/94   right nose   BREAST BIOPSY Right 1987   benign fibroadenoma   breast cancer recurrence Left 01/2000   at site of left chest wall after previous mastectomy with removal  and treatment with radiaton.   CARPAL TUNNEL RELEASE Right 05/08/2013   Procedure: RIGHT CARPAL TUNNEL RELEASE;  Surgeon: Wyn Forster., MD;  Location: [facility];  Service: Orthopedics;  Laterality: Right;   CATARACT EXTRACTION     03/20/14 left eye, 12/15 right eye   COLPOSCOPY W/ BIOPSY / CURETTAGE  03/12/12   benign, but + HR HPV   EYE SURGERY     bilat cataract surgery    MASTECTOMY Left 3/95   Stage IIb 0/14 LN ER/ PR + with mastectomy - Dr. Mardella Layman   MOHS SURGERY     right nare   OVARY SURGERY  1974   lt   REVERSE SHOULDER ARTHROPLASTY Left 02/26/2023   Procedure: REVERSE SHOULDER ARTHROPLASTY;  Surgeon: Yolonda Kida, MD;  Location: [facility];  Service: Orthopedics;  Laterality: Left;   TONSILLECTOMY AND ADENOIDECTOMY  1950   TOTAL HIP ARTHROPLASTY Right 09/08/2015   Procedure: TOTAL HIP ARTHROPLASTY ANTERIOR APPROACH;  Surgeon: Ollen Gross, MD;  Location: [facility];  Service: Orthopedics;  Laterality: Right;   Patient Active Problem List   Diagnosis Date Noted   Shoulder fracture 02/23/2023  Closed fracture of proximal end of left humerus, unspecified fracture morphology, initial encounter 02/22/2023   Thyroid nodule 02/22/2023   Major depressive disorder in remission 03/29/2020   Generalized anxiety disorder    Benign positional vertigo    Hypertension    Heart murmur    Stress fracture of metatarsal bone of left foot 12/15/2019   History of revision of total replacement of right hip joint 11/26/2017   Leg edema, left 09/26/2016   Post-nasal drainage 03/30/2016   OA (osteoarthritis) of hip 09/08/2015   Mild persistent asthma, uncomplicated 06/29/2015   Hip pain, bilateral 12/14/2014   Knee pain, bilateral 12/14/2014   Postmenopausal atrophic vaginitis 02/23/2013   Graves disease 2007   History of radiation therapy 2001   History of breast cancer 1995   Basal cell carcinoma 1994    PCP: Alysia Penna, MD  REFERRING PROVIDER: Thornton Papas, MD  REFERRING DIAG:  Diagnosis  C50.511,Z17.0 (ICD-10-CM) - Malignant neoplasm of lower-outer quadrant of right breast of female, estrogen receptor positive (HCC)  I89.0 (ICD-10-CM) - Lymphedema    THERAPY DIAG:  Postmastectomy lymphedema  S/P reverse total shoulder arthroplasty, left  ONSET DATE: 02/26/23  Rationale for Evaluation and Treatment: Rehabilitation  SUBJECTIVE                                                                                                                                                                                           SUBJECTIVE STATEMENT:  The doppler was fine, no blood clot. The bandage still felt a bit tight at my upper arm but I left it on until this morning so I could take a shower. I would like for you guys to do my PT for my reverse shoulder if that's possible. The firmness at my forearm is still there but getting much better.   PERTINENT HISTORY: Recent fall with left arm fracture.  Surgery with Reverse TSR 02/26/23.  This is the same side as her mastectomy which was diagnosed in 1995 with chest wall recurrence in 2001.  No remaining lymph nodes.   Shoulder surgery: Dr. Duwayne Heck -See media section for post-op approved exercises and okay for compression therapy.    PAIN:  Are you having pain? No discomfort in the arm  PRECAUTIONS: Can start AROM/AAROM at 03/27/23 and PROM at 2 months (04/23/23), use sling at night until 03/27/23; Can begin passive forward flexion to 90 degrees, scapular retractions, table slides, limiteD ER to neurtral until 4 weeks post op. Starting week 4 post op she can begin all active and AAROM as tolerated and consider adding foam to bandage at that time for fibrosis at  forearm. No PROM until 2 months post op. She can discontinue the sling during the day, use the sling at night for 2 more weeks.   RED FLAGS: None   WEIGHT BEARING RESTRICTIONS: No  FALLS:  Has patient fallen in last 6 months? Yes. Number  of falls 1 - does not remember details of her fall. Just remembers she was going outside.   LIVING ENVIRONMENT: Lives with: lives alone Has a CNA for assistance at home.   OCCUPATION: Retired  LEISURE: Not currently exercising  HAND DOMINANCE : Right  PRIOR LEVEL OF FUNCTION: Independent  PATIENT GOALS: prevent chronic lymphedema after this fall   OBJECTIVE (FROM EVAL) Note: Objective measures were completed at Evaluation unless otherwise noted.  COGNITION:  Overall cognitive status: Within functional limits for tasks assessed   PALPATION: +2 pitting edema back of hand, forearm, below, to around the distal 1/3rd of the upper arm.   OBSERVATIONS / OTHER ASSESSMENTS: bruising all fingers and hand, elbow, and upper arm from surgery.  Skin tight and shiny from edema.     POSTURE: wearing sling upon arrival and leaving  UPPER EXTREMITY AROM/PROM: Just starting wrist and elbow AROM - did not measure objectively.  No shoulder ROM allowed.   LYMPHEDEMA ASSESSMENTS:  LANDMARK RIGHT eval  At axilla 30.4  15cm proximal to olecranon 29.4  10 cm proximal to olecranon process 28.3  Olecranon process 24.7  15cm proximal to ulnar styloid 22.1  10 cm proximal to ulnar styloid process 18.7  Just proximal to ulnar styloid process 16.2  Across hand at thumb web space 19.8  At base of 2nd digit 6.4  (Blank rows = not tested)  LANDMARK LEFT  eval LEFT 03/26/2023  At axilla 33 31  15cm proximal to olecranon 32.5 29.4  10 cm proximal to olecranon process 34.5 30.2  Olecranon process 30.5 26.6  15cm proximal to ulnar styloid  28.7 25.6  10 cm proximal to ulnar styloid process 26.5 23.4  Just proximal to ulnar styloid process 18.5 17.2  Across hand at thumb web space 20.4 18.8  At base of 2nd digit 7.0 6.1  (Blank rows = not tested)  Volume (at eval):  Length 7cm Rt: Lt:   difference   PATIENT SURVEYS:  Quick Dash 68% limited   TODAY'S TREATMENT:                                                                                                                                          DATE:  04/02/23:  Manual Therapy Manual lymph drainage to left UE in supine: short neck, left inguinal nodes, left axillo-inguinal anastomosis, Rt axillary nodes, Rt anterior intact thorax sequence and anterior inter-axillary anastomosis, then left UE working proximal to distal then retracing all steps from fingers to axilla redirecting along pathways. Compression bandaging to left UE: Applied cocoa butter to Lt UE and scar,  then thick stockinette, next elastomull  to fingers 2-5 (pt requested leaving her thumb out today for function)  followed by 1/4" gray foam to dorsal hand/wrist, and then 1/4" gray foam to posterior forearm with small dotted peach Medi foam closer to skin side with Artiflex to entire hand and arm, next 6 cm to hand, 10 cm spiral with "X" at elbow, and 12 cm spiral Comprilan compression bandages from wrist to axilla. Dwaine Gale, PT was present during session to discuss with pt [place] adding reverse shoulder PT to her POC. It was determined that we would need an updated order to include that because our current referral only encompasses PT so Dorothyann Gibbs is going to reach out so when Alvira Monday, PT sees pt at next session she can assess shoulder and add that to our POC if cert back.   03/29/2023 Bandages removed. Hand looked excellent and forearm softer. Above wrap at axilla slightly more red Manual Therapy Manual lymph drainage to left UE in supine: short neck, left inguinal nodes, left axillo-inguinal anastomosis, Rt axillary nodes, Rt anterior intact thorax sequence and anterior inter-axillary anastomosis, then left UE working proximal to distal then retracing all steps from fingers to axilla redirecting along pathways. Compression bandaging to left UE: Applied cocoa butter and thick stockinette, next elastomull  to all 5 fingers followed by 1/4" gray foam to  dorsal hand/wrist, and then 1/4" gray foam to forearm with Artiflex to entire hand and arm, next 6 cm to hand, 10 cm spiral with "X" at elbow, and 12 cm spiral Comprilan compression bandages from wrist to axilla. Pt advised she can remove top wrap if bandage is uncomfortable or if it slides down and have someone else rewrap  03/28/23: Manual Therapy Manual lymph drainage to left UE in supine: short neck, left inguinal nodes, left axillo-inguinal anastomosis, Rt axillary nodes, Rt anterior intact thorax sequence and anterior inter-axillary anastomosis, then left UE working proximal to distal then retracing all steps from fingers to axilla redirecting along pathways. Compression bandaging to left UE: Applied cocoa butter and thick stockinette, next elastomull x 1.5 to all 5 fingers followed by 1/2" gray foam to dorsal hand/wrist, and then 1/4" gray foam to forearm with Artiflex to entire hand and arm, next 6 cm to hand, 10 cm spiral with "X" at elbow, and 12 cm spiral Comprilan compression bandages from wrist to axilla.  03/26/2023: Manual therapy Manual lymph drainage to left UE in supine: short neck, right axillary and left inguinal nodes, anterior inter-axillary and anterior inter-axillary pathways. Left UE from fingers to axilla redirecting along pathways. Measured Left UE; see above for circumferential measurements. Compression bandaging to left UE: Applied Eucerin lotion and thick stockinette. Applied elastomull to all 5 fingers followed by Artiflex to entire hand and arm. Applied 6 cm, 10 cm, and 12 cm Comprilan compression bandages from hand to axilla. No c/o discomfort. Discussed while doing treatment the need for a plan for her holiday travels to the [place]. We discussed various options and because of her recent shoulder replacement, we decided on an arm Farrow wrap and a compression glove. We discussed different compression amounts and decided on 20-30 mmHg knowing that is less than typically  recommended but because of her recent surgery and the need to don these independently, we decided this made the most sense. Gave the pt information about getting those ordered from [facility] and told her we could help order them online if they are unable to help. She plans  to go there tomorrow.   03/21/23: Manual Therapy Spent beginning of session instructing in pt and CNA in bandaging with demonstration and CNA videorecorded with phone with pts permission. Manual lymph drainage in supine as follows (shortened as time allowed): short neck, superficial and deep abdominals, right axillary nodes, right intact anterior thorax, anterior inter-axillary anastamoses, left inguinal nodes, left axillo-inguinal anastomosis, then only left upper arm as time allowed working from lateral, medial to lateral and lateral again, then redirecting along pathways (avoiding surgical site) Compression bandaging (x 2 today, first time for instruction): cocoa butter from hand to axilla, thick stockinette, elastomull x 1.5 rolls to digits 1-5, artiflex from hand to just below incision, 1 6cm bandage at hand and forearm, 1 10 cm spiral with "X" at elbow from mid wrist to axilla avoiding incision then folded TG soft over  03/19/2023: Manual lymph drainage in supine as follows: short neck, right axillary nodes, 5 diaphragmatic breaths, anterior inter-axillary anastamoses, Left upper extremity from fingers and dorsal hand to lateral shoulder redirecting along pathways (avoiding surgical site) Compression bandaging: cocoa butter from hand to axilla, thick stockinette, elastomull to digits 1-5, rosidal from hand to just below incision, 1 6cm bandage at hand and wrist, 1 10 cm from mid forearm to axilla avoiding incision then folded TG soft over  03/15/23 Brief eval performed to have time for treatment due to severity of edema Education on options: CDT with velcro vs bandaging vs compression garments and how those would be very  hard to put on at this time.   Discussed with Dwaine Gale PT, and Jakobee Brackins PTA about best options and we will be able to start bandaging with addition of some time slots.  Applied lotion, tg soft size medium, fingers 1-5 with mollelast, artiflex x 1 from hand to axilla with extra padding at elbow, 1 6cm hand into spiral up forearm, and 1 10 cm from mid forearm to axilla avoiding incision. TG soft with a large fold comfort.   Education on removing bandages if they get uncomfortable and if they start to slide down and that they won't stay on until Monday most likely.   Pt will bring back all items and use TG soft as able.  Encouraged active wrist and elbow movements.  Assisted pt to donn clothes and sling.    PATIENT EDUCATION:  Education details: per today's note Person educated: Patient Education method: Programmer, multimedia, Demonstration, and Verbal cues Education comprehension: verbalized understanding  HOME EXERCISE PROGRAM: none  ASSESSMENT:  CLINICAL IMPRESSION: Pts fibrosis is softening at posterior forearm though is still present so added dotted foam here to further reduce fibrosis. Dwaine Gale, PT is going to reach out to surgeon to see about getting a referral for pt adding PT for her reverse shoulder here per pt request. Once script received, PT will add to the POC. Pt brought her new velcro garment today which fit pt great. She would like to cont bandaging for now to further soften fibrosis, but is glad to have this when she travels to [place] for Christmas to see her family.     OBJECTIVE IMPAIRMENTS: decreased activity tolerance, decreased knowledge of condition, decreased knowledge of use of DME, decreased mobility, decreased ROM, and increased edema.   ACTIVITY LIMITATIONS: carrying, lifting, sleeping, bathing, dressing, and reach over head  PARTICIPATION LIMITATIONS: meal prep, cleaning, laundry, shopping, and community activity  PERSONAL FACTORS: Age and 1 comorbidity:  ALND and radiation hx  are also affecting patient's functional outcome.  REHAB POTENTIAL: Excellent  CLINICAL DECISION MAKING: Evolving/moderate complexity  EVALUATION COMPLEXITY: Moderate  GOALS: Goals reviewed with patient? Yes  SHORT TERM GOALS: Target date: 04/08/23    Pt will be ind with self MLD for edema reduction Baseline: Goal status: INITIAL  2.  Pt will decrease volume to or less to demonstrate improving edema status Baseline:  Goal status: INITIAL  3.  Pt will be evaluated and treated for shoulder with goals added as needed as instructed by MD during future visits.  Baseline:  Goal status: INITIAL   LONG TERM GOALS: Target date: 05/10/23  Pt will reduce to less than volume difference between sides to demonstrate no active lymphedema Baseline:  Goal status: INITIAL  2.  Pt will obtain compression as needed for discharge depending on reduction Baseline:  Goal status: INITIAL  PLAN:  PT FREQUENCY: 3x/week  PT DURATION: 8 weeks  PLANNED INTERVENTIONS: 97164- PT Re-evaluation, 97110-Therapeutic exercises, 97530- Therapeutic activity, 97112- Neuromuscular re-education, 97535- Self Care, and 96045- Manual therapy  PLAN FOR NEXT SESSION: How did pt tolerate compression new foam? Assess Lt shoulder once new order received; Continue complete decongestive therapy and begin AAROM and AROM left shoulder as per protocol; next appt with surgeon is 12/17. Pt needs new script for flat knit but should wait to be measured until fibrosis further decreases.   Berna Spare, PTA 04/02/23 4:42 PM

## 2023-04-03 NOTE — Therapy (Signed)
OUTPATIENT PT UPPER EXTREMITY LYMPHEDEMA TREATMENT/RE-eval  Patient Name: Sarah Underwood MRN: [id number] DOB:Nov 26, 1945, 77 y.o., female Today's Date: 04/04/2023  END OF SESSION:  PT End of Session - 04/04/23 1201     Visit Number 8    Number of Visits 25    Date for PT Re-Evaluation 05/02/23    Authorization Type [employer] Auth needed    Authorization Time Period 12 visits approved through 04/17/2023    Authorization - Visit Number 8    Authorization - Number of Visits 12    PT Start Time 1202    PT Stop Time 1257    PT Time Calculation (min) 55 min    Activity Tolerance Patient tolerated treatment well    Behavior During Therapy [facility] for tasks assessed/performed                Past Medical History:  Diagnosis Date   Basal cell carcinoma 1994   right side of node   Benign positional vertigo    with rolling over    Celiac disease    Dysrhythmia    Generalized anxiety disorder    GERD (gastroesophageal reflux disease)    Graves disease 2007   thyroid nodule   Hard of hearing    Heart murmur    History of breast cancer 1995   left - treated with mastectomy   History of radiation therapy 2001   37 sessions following recurrence of breast cancer in left chest wall   Hypertension    Knee pain, bilateral 12/14/2014   Major depressive disorder in remission    Mild persistent asthma, uncomplicated 06/29/2015   Nocturia    OA (osteoarthritis) of hip 09/08/2015   Ovarian mass    small solid nodule 8x60mm: Right ovary - stable since 2000, follow u/s do not show this.   Renal mass 2021   followed by Dr. Berneice Heinrich, [facility].  Having every 6 month follow up   Seasonal allergies    Stress fracture of metatarsal bone of left foot 12/15/2019   Vegetarian diet    Past Surgical History:  Procedure Laterality Date   APPENDECTOMY     basal cell removal  7/94   right nose   BREAST BIOPSY Right 1987   benign fibroadenoma   breast cancer recurrence Left 01/2000   at site of  left chest wall after previous mastectomy with removal and treatment with radiaton.   CARPAL TUNNEL RELEASE Right 05/08/2013   Procedure: RIGHT CARPAL TUNNEL RELEASE;  Surgeon: Wyn Forster., MD;  Location: [facility];  Service: Orthopedics;  Laterality: Right;   CATARACT EXTRACTION     03/20/14 left eye, 12/15 right eye   COLPOSCOPY W/ BIOPSY / CURETTAGE  03/12/12   benign, but + HR HPV   EYE SURGERY     bilat cataract surgery    MASTECTOMY Left 3/95   Stage IIb 0/14 LN ER/ PR + with mastectomy - Dr. Mardella Layman   MOHS SURGERY     right nare   OVARY SURGERY  1974   lt   REVERSE SHOULDER ARTHROPLASTY Left 02/26/2023   Procedure: REVERSE SHOULDER ARTHROPLASTY;  Surgeon: Yolonda Kida, MD;  Location: [facility] OR;  Service: Orthopedics;  Laterality: Left;   TONSILLECTOMY AND ADENOIDECTOMY  1950   TOTAL HIP ARTHROPLASTY Right 09/08/2015   Procedure: TOTAL HIP ARTHROPLASTY ANTERIOR APPROACH;  Surgeon: Ollen Gross, MD;  Location: [facility] ORS;  Service: Orthopedics;  Laterality: Right;   Patient Active Problem List  Diagnosis Date Noted   Shoulder fracture 02/23/2023   Closed fracture of proximal end of left humerus, unspecified fracture morphology, initial encounter 02/22/2023   Thyroid nodule 02/22/2023   Major depressive disorder in remission 03/29/2020   Generalized anxiety disorder    Benign positional vertigo    Hypertension    Heart murmur    Stress fracture of metatarsal bone of left foot 12/15/2019   History of revision of total replacement of right hip joint 11/26/2017   Leg edema, left 09/26/2016   Post-nasal drainage 03/30/2016   OA (osteoarthritis) of hip 09/08/2015   Mild persistent asthma, uncomplicated 06/29/2015   Hip pain, bilateral 12/14/2014   Knee pain, bilateral 12/14/2014   Postmenopausal atrophic vaginitis 02/23/2013   Graves disease 2007   History of radiation therapy 2001   History of breast cancer 1995   Basal cell carcinoma 1994    PCP:  Alysia Penna, MD  REFERRING PROVIDER: Duwayne Heck, MD  REFERRING DIAG:  Diagnosis  C50.511,Z17.0 (ICD-10-CM) - Malignant neoplasm of lower-outer quadrant of right breast of female, estrogen receptor positive (HCC)  I89.0 (ICD-10-CM) - Lymphedema  Z47.89 orthopedic aftercare  S/P reverse total shoulder arthroplasty, left  Encounter for other orthopedic aftercare  Stiffness of left shoulder, not elsewhere classified  THERAPY DIAG:  Postmastectomy lymphedema  S/P reverse total shoulder arthroplasty, left  Encounter for other orthopedic aftercare  Stiffness of left shoulder, not elsewhere classified  ONSET DATE: 02/26/23  Rationale for Evaluation and Treatment: Rehabilitation  SUBJECTIVE                                                                                                                                                                                           SUBJECTIVE STATEMENT:    MD sent protocol for left TSA so we can start doing that here as well and he approved lymphedema treatment. See Media section. Pt brought her caregiver with her. Would like therapist to show the caregiver how to don her new Farrow wrap. The bandages bothered my shoulder last night in bed some. Not sure if it was the heaviness or not. We took the wrap off this am so I could shower  PERTINENT HISTORY: Recent fall with left arm fracture.  Surgery with Reverse TSR 02/26/23.  This is the same side as her mastectomy which was diagnosed in 1995 with chest wall recurrence in 2001.  No remaining lymph nodes.   Shoulder surgery: Dr. Duwayne Heck -See media section for post-op approved exercises and okay for compression therapy.    PAIN:  Are you having pain?  PAIN:  Are you having pain? Yes NPRS scale: 5/10  at night sleeping, 0/10 now Pain location: left shoulder Pain orientation: Left and Upper  PAIN TYPE: aching Pain description: intermittent  Aggravating factors: maybe the wt of the  bandages Relieving factors: removing the bandage   PRECAUTIONS: Can start AROM/AAROM at 03/27/23 and PROM at 2 months (04/23/23), use sling at night until 03/27/23; Can begin passive forward flexion to 90 degrees, scapular retractions, table slides, limiteD ER to neurtral until 4 weeks post op. Starting week 4 post op she can begin all active and AAROM as tolerated and consider adding foam to bandage at that time for fibrosis at forearm. No PROM until 2 months post op. She can discontinue the sling during the day, use the sling at night for 2 more weeks.   RED FLAGS: None   WEIGHT BEARING RESTRICTIONS: No  FALLS:  Has patient fallen in last 6 months? Yes. Number of falls 1 - does not remember details of her fall. Just remembers she was going outside.   LIVING ENVIRONMENT: Lives with: lives alone Has a CNA for assistance at home.   OCCUPATION: Retired  LEISURE: Not currently exercising  HAND DOMINANCE : Right  PRIOR LEVEL OF FUNCTION: Independent  PATIENT GOALS: prevent chronic lymphedema after this fall   OBJECTIVE (FROM EVAL) Note: Objective measures were completed at Evaluation unless otherwise noted.  COGNITION:  Overall cognitive status: Within functional limits for tasks assessed   PALPATION: +2 pitting edema back of hand, forearm, below, to around the distal 1/3rd of the upper arm.   OBSERVATIONS / OTHER ASSESSMENTS: bruising all fingers and hand, elbow, and upper arm from surgery.  Skin tight and shiny from edema.     POSTURE: wearing sling upon arrival and leaving  UPPER EXTREMITY AROM/PROM: Just starting wrist and elbow AROM - did not measure objectively.  No shoulder ROM allowed.   04/04/2023 Left UE AAROM elevation in supine=   LEFT UE AROM elevation in supine:   LEFT ER AROM supine:17   LEFT IR AROM supine:68 30 deg abd  LYMPHEDEMA ASSESSMENTS:  LANDMARK RIGHT eval  At axilla 30.4  15cm proximal to olecranon 29.4  10 cm proximal to olecranon process  28.3  Olecranon process 24.7  15cm proximal to ulnar styloid 22.1  10 cm proximal to ulnar styloid process 18.7  Just proximal to ulnar styloid process 16.2  Across hand at thumb web space 19.8  At base of 2nd digit 6.4  (Blank rows = not tested)  LANDMARK LEFT  eval LEFT 03/26/2023  At axilla 33 31  15cm proximal to olecranon 32.5 29.4  10 cm proximal to olecranon process 34.5 30.2  Olecranon process 30.5 26.6  15cm proximal to ulnar styloid  28.7 25.6  10 cm proximal to ulnar styloid process 26.5 23.4  Just proximal to ulnar styloid process 18.5 17.2  Across hand at thumb web space 20.4 18.8  At base of 2nd digit 7.0 6.1  (Blank rows = not tested)  Volume (at eval):  Length 7cm Rt: Lt:   difference   PATIENT SURVEYS:  Quick Dash 68% limited   TODAY'S TREATMENT:  DATE:   04/04/2023 Pt practiced AROM elbow flexion in standing and was then placed in supine to perform shoulder AROM per protocol. Pt practiced IR and ER to tolerance and indicated that she has been doing this already. Showed her how to perform AROM flexion starting with a bent elbow; therapist stabilized pt at the wrist while she performed x 6, and then performed independently x 10. Pts AROM was measured for recert. Helped pt don new velcro wrap so her caregiver could see how it is supposed to be and how to tighten. Applied velcro strips to the wrap so she would know how far to pull. Velcro Wrap was then removed  Compression bandaging to left UE: Applied cocoa butter to Lt UE and scar, then thick stockinette, next elastomull  to fingers 1-5 (pt with only base of thumb wrapped at pt request  for function)  followed by 1/4" gray foam to dorsal hand/wrist, and then 1/4" gray foam to posterior forearm with small dotted peach Medi foam closer to skin side with  Artiflex to entire hand and arm, next 6 cm to hand, 10 cm spiral with "X" at elbow, and 12 cm spiral Comprilan compression bandages from wrist to axilla. Pt advised to remove top wrap if it is too uncomfortable especially at night.  04/02/23:  Manual Therapy Manual lymph drainage to left UE in supine: short neck, left inguinal nodes, left axillo-inguinal anastomosis, Rt axillary nodes, Rt anterior intact thorax sequence and anterior inter-axillary anastomosis, then left UE working proximal to distal then retracing all steps from fingers to axilla redirecting along pathways. Compression bandaging to left UE: Applied cocoa butter to Lt UE and scar, then thick stockinette, next elastomull  to fingers 2-5 (pt requested leaving her thumb out today for function)  followed by 1/4" gray foam to dorsal hand/wrist, and then 1/4" gray foam to posterior forearm with small dotted peach Medi foam closer to skin side with Artiflex to entire hand and arm, next 6 cm to hand, 10 cm spiral with "X" at elbow, and 12 cm spiral Comprilan compression bandages from wrist to axilla. Dwaine Gale, PT was present during session to discuss with pt [place] adding reverse shoulder PT to her POC. It was determined that we would need an updated order to include that because our current referral only encompasses PT so Dorothyann Gibbs is going to reach out so when Alvira Monday, PT sees pt at next session she can assess shoulder and add that to our POC if cert back.   03/29/2023 Bandages removed. Hand looked excellent and forearm softer. Above wrap at axilla slightly more red Manual Therapy Manual lymph drainage to left UE in supine: short neck, left inguinal nodes, left axillo-inguinal anastomosis, Rt axillary nodes, Rt anterior intact thorax sequence and anterior inter-axillary anastomosis, then left UE working proximal to distal then retracing all steps from fingers to axilla redirecting along pathways. Compression bandaging to left UE: Applied cocoa  butter and thick stockinette, next elastomull  to all 5 fingers followed by 1/4" gray foam to dorsal hand/wrist, and then 1/4" gray foam to forearm with Artiflex to entire hand and arm, next 6 cm to hand, 10 cm spiral with "X" at elbow, and 12 cm spiral Comprilan compression bandages from wrist to axilla. Pt advised she can remove top wrap if bandage is uncomfortable or if it slides down and have someone else rewrap  03/28/23: Manual Therapy Manual lymph drainage to left UE in supine: short neck, left inguinal nodes, left axillo-inguinal  anastomosis, Rt axillary nodes, Rt anterior intact thorax sequence and anterior inter-axillary anastomosis, then left UE working proximal to distal then retracing all steps from fingers to axilla redirecting along pathways. Compression bandaging to left UE: Applied cocoa butter and thick stockinette, next elastomull x 1.5 to all 5 fingers followed by 1/2" gray foam to dorsal hand/wrist, and then 1/4" gray foam to forearm with Artiflex to entire hand and arm, next 6 cm to hand, 10 cm spiral with "X" at elbow, and 12 cm spiral Comprilan compression bandages from wrist to axilla.  03/26/2023: Manual therapy Manual lymph drainage to left UE in supine: short neck, right axillary and left inguinal nodes, anterior inter-axillary and anterior inter-axillary pathways. Left UE from fingers to axilla redirecting along pathways. Measured Left UE; see above for circumferential measurements. Compression bandaging to left UE: Applied Eucerin lotion and thick stockinette. Applied elastomull to all 5 fingers followed by Artiflex to entire hand and arm. Applied 6 cm, 10 cm, and 12 cm Comprilan compression bandages from hand to axilla. No c/o discomfort. Discussed while doing treatment the need for a plan for her holiday travels to the [place]. We discussed various options and because of her recent shoulder replacement, we decided on an arm Farrow wrap and a compression glove. We discussed  different compression amounts and decided on 20-30 mmHg knowing that is less than typically recommended but because of her recent surgery and the need to don these independently, we decided this made the most sense. Gave the pt information about getting those ordered from [facility] and told her we could help order them online if they are unable to help. She plans to go there tomorrow.   03/21/23: Manual Therapy Spent beginning of session instructing in pt and CNA in bandaging with demonstration and CNA videorecorded with phone with pts permission. Manual lymph drainage in supine as follows (shortened as time allowed): short neck, superficial and deep abdominals, right axillary nodes, right intact anterior thorax, anterior inter-axillary anastamoses, left inguinal nodes, left axillo-inguinal anastomosis, then only left upper arm as time allowed working from lateral, medial to lateral and lateral again, then redirecting along pathways (avoiding surgical site) Compression bandaging (x 2 today, first time for instruction): cocoa butter from hand to axilla, thick stockinette, elastomull x 1.5 rolls to digits 1-5, artiflex from hand to just below incision, 1 6cm bandage at hand and forearm, 1 10 cm spiral with "X" at elbow from mid wrist to axilla avoiding incision then folded TG soft over  03/19/2023: Manual lymph drainage in supine as follows: short neck, right axillary nodes, 5 diaphragmatic breaths, anterior inter-axillary anastamoses, Left upper extremity from fingers and dorsal hand to lateral shoulder redirecting along pathways (avoiding surgical site) Compression bandaging: cocoa butter from hand to axilla, thick stockinette, elastomull to digits 1-5, rosidal from hand to just below incision, 1 6cm bandage at hand and wrist, 1 10 cm from mid forearm to axilla avoiding incision then folded TG soft over  03/15/23 Brief eval performed to have time for treatment due to severity of edema Education  on options: CDT with velcro vs bandaging vs compression garments and how those would be very hard to put on at this time.   Discussed with Dwaine Gale PT, and valerie rosenberger PTA about best options and we will be able to start bandaging with addition of some time slots.  Applied lotion, tg soft size medium, fingers 1-5 with mollelast, artiflex x 1 from hand to axilla with extra padding  at elbow, 1 6cm hand into spiral up forearm, and 1 10 cm from mid forearm to axilla avoiding incision. TG soft with a large fold comfort.   Education on removing bandages if they get uncomfortable and if they start to slide down and that they won't stay on until Monday most likely.   Pt will bring back all items and use TG soft as able.  Encouraged active wrist and elbow movements.  Assisted pt to donn clothes and sling.    PATIENT EDUCATION:  Education details: per today's note Person educated: Patient Education method: Programmer, multimedia, Demonstration, and Verbal cues Education comprehension: verbalized understanding  HOME EXERCISE PROGRAM: none  ASSESSMENT:  CLINICAL IMPRESSION: Pt was reevaluated today due to the addition of her reverse TSA being included in treatment plan now. Protocol in media section of note. Pt is now 5 weeks s/p reverse TSA and is working on AROM per protocol with addition of supine flexion today. She has been compliant with her HEP.Pt was also instructed in the use of her velcro wrap while she is travelling in [place], but prefers to continue with compression bandaging to soften the forearm further. She did feel foam in wrap helped to soften forearm. She will continue therapy for left reverse TSA in addition to CDT to reduce and decongest left UE lymphedema.  OBJECTIVE IMPAIRMENTS: decreased activity tolerance, decreased knowledge of condition, decreased knowledge of use of DME, decreased mobility, decreased ROM, and increased edema.   ACTIVITY LIMITATIONS: carrying, lifting, sleeping,  bathing, dressing, and reach over head  PARTICIPATION LIMITATIONS: meal prep, cleaning, laundry, shopping, and community activity  PERSONAL FACTORS: Age and 1 comorbidity: ALND and radiation hx  are also affecting patient's functional outcome.   REHAB POTENTIAL: Excellent  CLINICAL DECISION MAKING: Evolving/moderate complexity  EVALUATION COMPLEXITY: Moderate  GOALS: Goals reviewed with patient? Yes  SHORT TERM GOALS: Target date: 04/08/23    Pt will be ind with self MLD for edema reduction Baseline: Goal status: INITIAL  2.  Pt will decrease volume to or less to demonstrate improving edema status Baseline:  Goal status: INITIAL  3.  Pt will be evaluated and treated for shoulder with goals added as needed as instructed by MD during future visits.  Baseline:  Goal status:MET 04/04/2023  LONG TERM GOALS: Target date: 05/10/23  Pt will reduce to less than volume difference between sides to demonstrate no active lymphedema Baseline:  Goal status: INITIAL  2.  Pt will obtain compression as needed for discharge depending on reduction Baseline:  Goal status: INITIAL   3. Pt will be able to perform AROM  elevation in supine to atleast 120 degrees without compensation  Goal Status; Re-EVAL  4. Pt will be able to place 8 oz object on eye level shelf without UT compensation  GOALStatus: RE-EVAL  5. Pt will be tolerant of resisted exs for improving UE strength at 7 weeks post op Goal Status; RE-EVAL PLAN:  PT FREQUENCY: 3x/week  PT DURATION:  4 weeks  PLANNED INTERVENTIONS: 97164- PT Re-evaluation, 97110-Therapeutic exercises, 97530- Therapeutic activity, 97112- Neuromuscular re-education, 97535- Self Care, and 40347- Manual therapy  PLAN FOR NEXT SESSION: ; Continue complete decongestive therapy and begin AAROM and AROM left shoulder as per protocol; see Media section, next appt with surgeon is 12/17. Pt needs new script for flat knit but should wait to be  measured until fibrosis further decreases.   Alvira Monday 04/04/23 12:59 PM

## 2023-04-04 ENCOUNTER — Ambulatory Visit: Payer: Medicare PPO | Attending: Orthopedic Surgery

## 2023-04-04 DIAGNOSIS — Z4789 Encounter for other orthopedic aftercare: Secondary | ICD-10-CM | POA: Insufficient documentation

## 2023-04-04 DIAGNOSIS — I972 Postmastectomy lymphedema syndrome: Secondary | ICD-10-CM | POA: Diagnosis not present

## 2023-04-04 DIAGNOSIS — Z170 Estrogen receptor positive status [ER+]: Secondary | ICD-10-CM | POA: Insufficient documentation

## 2023-04-04 DIAGNOSIS — Z96612 Presence of left artificial shoulder joint: Secondary | ICD-10-CM | POA: Insufficient documentation

## 2023-04-04 DIAGNOSIS — C50511 Malignant neoplasm of lower-outer quadrant of right female breast: Secondary | ICD-10-CM | POA: Insufficient documentation

## 2023-04-04 DIAGNOSIS — M25612 Stiffness of left shoulder, not elsewhere classified: Secondary | ICD-10-CM | POA: Insufficient documentation

## 2023-04-04 DIAGNOSIS — Z9889 Other specified postprocedural states: Secondary | ICD-10-CM | POA: Insufficient documentation

## 2023-04-05 ENCOUNTER — Ambulatory Visit: Payer: Medicare PPO | Admitting: Neurology

## 2023-04-05 ENCOUNTER — Other Ambulatory Visit: Payer: Medicare PPO

## 2023-04-05 ENCOUNTER — Encounter: Payer: Self-pay | Admitting: Neurology

## 2023-04-05 VITALS — BP 152/76 | HR 78 | Ht 62.0 in | Wt 150.0 lb

## 2023-04-05 DIAGNOSIS — R200 Anesthesia of skin: Secondary | ICD-10-CM

## 2023-04-05 DIAGNOSIS — R2689 Other abnormalities of gait and mobility: Secondary | ICD-10-CM

## 2023-04-05 DIAGNOSIS — M545 Low back pain, unspecified: Secondary | ICD-10-CM | POA: Diagnosis not present

## 2023-04-05 DIAGNOSIS — R202 Paresthesia of skin: Secondary | ICD-10-CM | POA: Diagnosis not present

## 2023-04-05 DIAGNOSIS — G629 Polyneuropathy, unspecified: Secondary | ICD-10-CM | POA: Diagnosis not present

## 2023-04-05 DIAGNOSIS — W19XXXS Unspecified fall, sequela: Secondary | ICD-10-CM | POA: Diagnosis not present

## 2023-04-05 DIAGNOSIS — G8929 Other chronic pain: Secondary | ICD-10-CM

## 2023-04-05 NOTE — Patient Instructions (Addendum)
I saw you today for numbness and burning in legs. Your symptoms are consistent with neuropathy. There is some evidence that the right leg is worse than the left, so this could be an overlapping pinched nerve in the back.  I would like to investigate further with: -Blood work today -MRI of your low back (lumbar spine)  Alpha lipoic acid 600mg  daily has some research data suggesting it helps with nerve health. No major side effects other than <1% of people report upset stomach. This can be taken twice per day (1200mg  daily) if no relief obtained. You can buy this over the counter or online.   You can also try Lidocaine cream as needed. Apply wear you have pain, tingling, or burning. Wear gloves to prevent your hands being numb. This can be bought over the counter at any drug store or online.   We discussed neuropathic pain medication like gabapentin, but will hold on this for now at your preference.  I will be in touch when I have your results. I will see you back in clinic in 6 months. Please let me know if you have any questions or concerns in the meantime.   The physicians and staff at [facility] are committed to providing excellent care. You may receive a survey requesting feedback about your experience at our office. We strive to receive "very good" responses to the survey questions. If you feel that your experience would prevent you from giving the office a "very good " response, please contact our office to try to remedy the situation. We may be reached at [phone]. Thank you for taking the time out of your busy day to complete the survey.  Jacquelyne Balint, MD [facility]  Preventing Falls at [facility] are common, often dreaded events in the lives of older people. Aside from the obvious injuries and even death that may result, fall can cause wide-ranging consequences including loss of independence, mental decline, decreased activity and mobility. Younger people are also at  risk of falling, especially those with chronic illnesses and fatigue.  Ways to reduce risk for falling Examine diet and medications. Warm foods and alcohol dilate blood vessels, which can lead to dizziness when standing. Sleep aids, antidepressants and pain medications can also increase the likelihood of a fall.  Get a vision exam. Poor vision, cataracts and glaucoma increase the chances of falling.  Check foot gear. Shoes should fit snugly and have a sturdy, nonskid sole and a broad, low heel  Participate in a physician-approved exercise program to build and maintain muscle strength and improve balance and coordination. Programs that use ankle weights or stretch bands are excellent for muscle-strengthening. Water aerobics programs and low-impact Tai Chi programs have also been shown to improve balance and coordination.  Increase vitamin D intake. Vitamin D improves muscle strength and increases the amount of calcium the body is able to absorb and deposit in bones.  How to prevent falls from common hazards Floors - Remove all loose wires, cords, and throw rugs. Minimize clutter. Make sure rugs are anchored and smooth. Keep furniture in its usual place.  Chairs -- Use chairs with straight backs, armrests and firm seats. Add firm cushions to existing pieces to add height.  Bathroom - Install grab bars and non-skid tape in the tub or shower. Use a bathtub transfer bench or a shower chair with a back support Use an elevated toilet seat and/or safety rails to assist standing from a low surface. Do not use  towel racks or bathroom tissue holders to help you stand.  Lighting - Make sure halls, stairways, and entrances are well-lit. Install a night light in your bathroom or hallway. Make sure there is a light switch at the top and bottom of the staircase. Turn lights on if you get up in the middle of the night. Make sure lamps or light switches are within reach of the bed if you have to get up during the  night.  Kitchen - Install non-skid rubber mats near the sink and stove. Clean spills immediately. Store frequently used utensils, pots, pans between waist and eye level. This helps prevent reaching and bending. Sit when getting things out of lower cupboards.  Living room/ Bedrooms - Place furniture with wide spaces in between, giving enough room to move around. Establish a route through the living room that gives you something to hold onto as you walk.  Stairs - Make sure treads, rails, and rugs are secure. Install a rail on both sides of the stairs. If stairs are a threat, it might be helpful to arrange most of your activities on the lower level to reduce the number of times you must climb the stairs.  Entrances and doorways - Install metal handles on the walls adjacent to the doorknobs of all doors to make it more secure as you travel through the doorway.  Tips for maintaining balance Keep at least one hand free at all times. Try using a backpack or fanny pack to hold things rather than carrying them in your hands. Never carry objects in both hands when walking as this interferes with keeping your balance.  Attempt to swing both arms from front to back while walking. This might require a conscious effort if Parkinson's disease has diminished your movement. It will, however, help you to maintain balance and posture, and reduce fatigue.  Consciously lift your feet off of the ground when walking. Shuffling and dragging of the feet is a common culprit in losing your balance.  When trying to navigate turns, use a "U" technique of facing forward and making a wide turn, rather than pivoting sharply.  Try to stand with your feet shoulder-length apart. When your feet are close together for any length of time, you increase your risk of losing your balance and falling.  Do one thing at a time. Don't try to walk and accomplish another task, such as reading or looking around. The decrease in your automatic  reflexes complicates motor function, so the less distraction, the better.  Do not wear rubber or gripping soled shoes, they might "catch" on the floor and cause tripping.  Move slowly when changing positions. Use deliberate, concentrated movements and, if needed, use a grab bar or walking aid. Count 15 seconds between each movement. For example, when rising from a seated position, wait 15 seconds after standing to begin walking.  If balance is a continuous problem, you might want to consider a walking aid such as a cane, walking stick, or walker. Once you've mastered walking with help, you might be ready to try it on your own again.

## 2023-04-09 ENCOUNTER — Ambulatory Visit (HOSPITAL_COMMUNITY)
Admission: RE | Admit: 2023-04-09 | Discharge: 2023-04-09 | Disposition: A | Discharge status: Home / Self Care | Payer: Medicare PPO | Source: Ambulatory Visit | Attending: Internal Medicine | Admitting: Internal Medicine

## 2023-04-09 ENCOUNTER — Ambulatory Visit: Payer: Medicare PPO | Admitting: Physical Therapy

## 2023-04-09 ENCOUNTER — Encounter: Payer: Self-pay | Admitting: Physical Therapy

## 2023-04-09 DIAGNOSIS — Z96612 Presence of left artificial shoulder joint: Secondary | ICD-10-CM

## 2023-04-09 DIAGNOSIS — M25612 Stiffness of left shoulder, not elsewhere classified: Secondary | ICD-10-CM | POA: Diagnosis not present

## 2023-04-09 DIAGNOSIS — Z4789 Encounter for other orthopedic aftercare: Secondary | ICD-10-CM | POA: Diagnosis not present

## 2023-04-09 DIAGNOSIS — Z9889 Other specified postprocedural states: Secondary | ICD-10-CM | POA: Diagnosis not present

## 2023-04-09 DIAGNOSIS — E042 Nontoxic multinodular goiter: Secondary | ICD-10-CM | POA: Diagnosis not present

## 2023-04-09 DIAGNOSIS — I972 Postmastectomy lymphedema syndrome: Secondary | ICD-10-CM

## 2023-04-09 DIAGNOSIS — C50511 Malignant neoplasm of lower-outer quadrant of right female breast: Secondary | ICD-10-CM | POA: Diagnosis not present

## 2023-04-09 DIAGNOSIS — E041 Nontoxic single thyroid nodule: Secondary | ICD-10-CM | POA: Insufficient documentation

## 2023-04-09 DIAGNOSIS — Z170 Estrogen receptor positive status [ER+]: Secondary | ICD-10-CM | POA: Diagnosis not present

## 2023-04-09 NOTE — Therapy (Signed)
OUTPATIENT PT UPPER EXTREMITY LYMPHEDEMA TREATMENT/RE-eval  Patient Name: Sarah Underwood MRN: [id number] DOB:[birth year], 77 y.o., female Today's Date: 04/09/2023  END OF SESSION:  PT End of Session - 04/09/23 1302     Visit Number 9    Number of Visits 25    Date for PT Re-Evaluation 05/02/23    Authorization Type [employer] Auth needed    Authorization Time Period 12 visits approved through 04/17/2023    PT Start Time 1202    PT Stop Time 1301    PT Time Calculation (min) 59 min    Activity Tolerance Patient tolerated treatment well    Behavior During Therapy [facility] for tasks assessed/performed                 Past Medical History:  Diagnosis Date   Basal cell carcinoma 1994   right side of node   Benign positional vertigo    with rolling over    Celiac disease    Dysrhythmia    Generalized anxiety disorder    GERD (gastroesophageal reflux disease)    Graves disease 2007   thyroid nodule   Hard of hearing    Heart murmur    History of breast cancer 1995   left - treated with mastectomy   History of radiation therapy 2001   37 sessions following recurrence of breast cancer in left chest wall   Hypertension    Knee pain, bilateral 12/14/2014   Major depressive disorder in remission    Mild persistent asthma, uncomplicated 06/29/2015   Nocturia    OA (osteoarthritis) of hip 09/08/2015   Ovarian mass    small solid nodule 8x66mm: Right ovary - stable since 2000, follow u/s do not show this.   Renal mass 2021   followed by Dr. Berneice Heinrich, [facility].  Having every 6 month follow up   Seasonal allergies    Stress fracture of metatarsal bone of left foot 12/15/2019   Vegetarian diet    Past Surgical History:  Procedure Laterality Date   APPENDECTOMY     basal cell removal  7/94   right nose   BREAST BIOPSY Right 1987   benign fibroadenoma   breast cancer recurrence Left 01/2000   at site of left chest wall after previous mastectomy with removal and treatment with  radiaton.   CARPAL TUNNEL RELEASE Right 05/08/2013   Procedure: RIGHT CARPAL TUNNEL RELEASE;  Surgeon: Wyn Forster., MD;  Location: [facility];  Service: Orthopedics;  Laterality: Right;   CATARACT EXTRACTION     03/20/14 left eye, 12/15 right eye   COLPOSCOPY W/ BIOPSY / CURETTAGE  03/12/12   benign, but + HR HPV   EYE SURGERY     bilat cataract surgery    MASTECTOMY Left 3/95   Stage IIb 0/14 LN ER/ PR + with mastectomy - Dr. Mardella Layman   MOHS SURGERY     right nare   OVARY SURGERY  1974   lt   REVERSE SHOULDER ARTHROPLASTY Left 02/26/2023   Procedure: REVERSE SHOULDER ARTHROPLASTY;  Surgeon: Yolonda Kida, MD;  Location: [facility] OR;  Service: Orthopedics;  Laterality: Left;   TONSILLECTOMY AND ADENOIDECTOMY  1950   TOTAL HIP ARTHROPLASTY Right 09/08/2015   Procedure: TOTAL HIP ARTHROPLASTY ANTERIOR APPROACH;  Surgeon: Ollen Gross, MD;  Location: [facility];  Service: Orthopedics;  Laterality: Right;   Patient Active Problem List   Diagnosis Date Noted   Shoulder fracture 02/23/2023   Closed fracture of proximal end of  left humerus, unspecified fracture morphology, initial encounter 02/22/2023   Thyroid nodule 02/22/2023   Major depressive disorder in remission 03/29/2020   Generalized anxiety disorder    Benign positional vertigo    Hypertension    Heart murmur    Stress fracture of metatarsal bone of left foot 12/15/2019   History of revision of total replacement of right hip joint 11/26/2017   Leg edema, left 09/26/2016   Post-nasal drainage 03/30/2016   OA (osteoarthritis) of hip 09/08/2015   Mild persistent asthma, uncomplicated 06/29/2015   Hip pain, bilateral 12/14/2014   Knee pain, bilateral 12/14/2014   Postmenopausal atrophic vaginitis 02/23/2013   Graves disease 2007   History of radiation therapy 2001   History of breast cancer 1995   Basal cell carcinoma 1994    PCP: Alysia Penna, MD  REFERRING PROVIDER: Duwayne Heck, MD  REFERRING  DIAG:  Diagnosis  C50.511,Z17.0 (ICD-10-CM) - Malignant neoplasm of lower-outer quadrant of right breast of female, estrogen receptor positive (HCC)  I89.0 (ICD-10-CM) - Lymphedema  Z47.89 orthopedic aftercare  S/P reverse total shoulder arthroplasty, left  Encounter for other orthopedic aftercare  Stiffness of left shoulder, not elsewhere classified  THERAPY DIAG:  Postmastectomy lymphedema  S/P reverse total shoulder arthroplasty, left  Encounter for other orthopedic aftercare  Stiffness of left shoulder, not elsewhere classified  ONSET DATE: 02/26/23  Rationale for Evaluation and Treatment: Rehabilitation  SUBJECTIVE                                                                                                                                                                                           SUBJECTIVE STATEMENT:  I love the Farrow wrap compared to the bandages but it gets loose and slides down and I can not reach up at the top to tighten it. My thumb went numb while wearing the glove and it has not come back. My thumb was also numb after surgery and was the last thing to come back.   PERTINENT HISTORY: Recent fall with left arm fracture.  Surgery with Reverse TSR 02/26/23.  This is the same side as her mastectomy which was diagnosed in 1995 with chest wall recurrence in 2001.  No remaining lymph nodes.   Shoulder surgery: Dr. Duwayne Heck -See media section for post-op approved exercises and okay for compression therapy.    PAIN:  Are you having pain?  PAIN:  Are you having pain? No   PRECAUTIONS: Can start AROM/AAROM at 03/27/23 and PROM at 2 months (04/23/23), use sling at night until 03/27/23; Can begin passive forward flexion to 90 degrees, scapular retractions, table slides, limiteD ER to neurtral  until 4 weeks post op. Starting week 4 post op she can begin all active and AAROM as tolerated and consider adding foam to bandage at that time for fibrosis at forearm.  No PROM until 2 months post op. She can discontinue the sling during the day, use the sling at night for 2 more weeks.   RED FLAGS: None   WEIGHT BEARING RESTRICTIONS: No  FALLS:  Has patient fallen in last 6 months? Yes. Number of falls 1 - does not remember details of her fall. Just remembers she was going outside.   LIVING ENVIRONMENT: Lives with: lives alone Has a CNA for assistance at home.   OCCUPATION: Retired  LEISURE: Not currently exercising  HAND DOMINANCE : Right  PRIOR LEVEL OF FUNCTION: Independent  PATIENT GOALS: prevent chronic lymphedema after this fall   OBJECTIVE (FROM EVAL) Note: Objective measures were completed at Evaluation unless otherwise noted.  COGNITION:  Overall cognitive status: Within functional limits for tasks assessed   PALPATION: +2 pitting edema back of hand, forearm, below, to around the distal 1/3rd of the upper arm.   OBSERVATIONS / OTHER ASSESSMENTS: bruising all fingers and hand, elbow, and upper arm from surgery.  Skin tight and shiny from edema.     POSTURE: wearing sling upon arrival and leaving  UPPER EXTREMITY AROM/PROM: Just starting wrist and elbow AROM - did not measure objectively.  No shoulder ROM allowed.   04/04/2023 Left UE AAROM elevation in supine=   LEFT UE AROM elevation in supine:   LEFT ER AROM supine:17   LEFT IR AROM supine:68 30 deg abd  LYMPHEDEMA ASSESSMENTS:  LANDMARK RIGHT eval  At axilla 30.4  15cm proximal to olecranon 29.4  10 cm proximal to olecranon process 28.3  Olecranon process 24.7  15cm proximal to ulnar styloid 22.1  10 cm proximal to ulnar styloid process 18.7  Just proximal to ulnar styloid process 16.2  Across hand at thumb web space 19.8  At base of 2nd digit 6.4  (Blank rows = not tested)  LANDMARK LEFT  eval LEFT 03/26/2023  At axilla 33 31  15cm proximal to olecranon 32.5 29.4  10 cm proximal to olecranon process 34.5 30.2  Olecranon process 30.5 26.6  15cm proximal  to ulnar styloid  28.7 25.6  10 cm proximal to ulnar styloid process 26.5 23.4  Just proximal to ulnar styloid process 18.5 17.2  Across hand at thumb web space 20.4 18.8  At base of 2nd digit 7.0 6.1  (Blank rows = not tested)  Volume (at eval):  Length 7cm Rt: Lt:   difference   PATIENT SURVEYS:  Quick Dash 68% limited   TODAY'S TREATMENT:                                                                                                                                         DATE:  04/09/23:  Reviewed exercises given at last session and discussed AROM vs AAROM and how both are now ok in this phase of healing as long as no pain  Encouraged pt to move her arm through all ROM as tolerated  Pt demonstrated ER exercise she has been doing as well as supine flexion with elbow bent and therapist educated to use R hand to help with bring arm back to starting position because this is where pt has pain Had pt practice donning/doffing farrow wrap - educated pt not to leave it on once it slides down and to re adjust and tighten straps which pt was unable to do independently so focused on this today Educated pt to reach under her arm to tighten straps instead of over the top  Adjusted position of velcro markers since pt demonstrates reduction since last session Pt able to demonstrate independent donning/doffing of wrap and glove by end of session  04/04/2023 Pt practiced AROM elbow flexion in standing and was then placed in supine to perform shoulder AROM per protocol. Pt practiced IR and ER to tolerance and indicated that she has been doing this already. Showed her how to perform AROM flexion starting with a bent elbow; therapist stabilized pt at the wrist while she performed x 6, and then performed independently x 10. Pts AROM was measured for recert. Helped pt don new velcro wrap so her caregiver could see how it is supposed to be and how to tighten. Applied velcro strips to  the wrap so she would know how far to pull. Velcro Wrap was then removed  Compression bandaging to left UE: Applied cocoa butter to Lt UE and scar, then thick stockinette, next elastomull  to fingers 1-5 (pt with only base of thumb wrapped at pt request  for function)  followed by 1/4" gray foam to dorsal hand/wrist, and then 1/4" gray foam to posterior forearm with small dotted peach Medi foam closer to skin side with Artiflex to entire hand and arm, next 6 cm to hand, 10 cm spiral with "X" at elbow, and 12 cm spiral Comprilan compression bandages from wrist to axilla. Pt advised to remove top wrap if it is too uncomfortable especially at night.  04/02/23:  Manual Therapy Manual lymph drainage to left UE in supine: short neck, left inguinal nodes, left axillo-inguinal anastomosis, Rt axillary nodes, Rt anterior intact thorax sequence and anterior inter-axillary anastomosis, then left UE working proximal to distal then retracing all steps from fingers to axilla redirecting along pathways. Compression bandaging to left UE: Applied cocoa butter to Lt UE and scar, then thick stockinette, next elastomull  to fingers 2-5 (pt requested leaving her thumb out today for function)  followed by 1/4" gray foam to dorsal hand/wrist, and then 1/4" gray foam to posterior forearm with small dotted peach Medi foam closer to skin side with Artiflex to entire hand and arm, next 6 cm to hand, 10 cm spiral with "X" at elbow, and 12 cm spiral Comprilan compression bandages from wrist to axilla. Dwaine Gale, PT was present during session to discuss with pt [place] adding reverse shoulder PT to her POC. It was determined that we would need an updated order to include that because our current referral only encompasses PT so Dorothyann Gibbs is going to reach out so when Alvira Monday, PT sees pt at next session she can assess shoulder and add that to our POC if cert back.   03/29/2023 Bandages removed. Hand looked excellent and forearm softer.  Above wrap  at axilla slightly more red Manual Therapy Manual lymph drainage to left UE in supine: short neck, left inguinal nodes, left axillo-inguinal anastomosis, Rt axillary nodes, Rt anterior intact thorax sequence and anterior inter-axillary anastomosis, then left UE working proximal to distal then retracing all steps from fingers to axilla redirecting along pathways. Compression bandaging to left UE: Applied cocoa butter and thick stockinette, next elastomull  to all 5 fingers followed by 1/4" gray foam to dorsal hand/wrist, and then 1/4" gray foam to forearm with Artiflex to entire hand and arm, next 6 cm to hand, 10 cm spiral with "X" at elbow, and 12 cm spiral Comprilan compression bandages from wrist to axilla. Pt advised she can remove top wrap if bandage is uncomfortable or if it slides down and have someone else rewrap  03/28/23: Manual Therapy Manual lymph drainage to left UE in supine: short neck, left inguinal nodes, left axillo-inguinal anastomosis, Rt axillary nodes, Rt anterior intact thorax sequence and anterior inter-axillary anastomosis, then left UE working proximal to distal then retracing all steps from fingers to axilla redirecting along pathways. Compression bandaging to left UE: Applied cocoa butter and thick stockinette, next elastomull x 1.5 to all 5 fingers followed by 1/2" gray foam to dorsal hand/wrist, and then 1/4" gray foam to forearm with Artiflex to entire hand and arm, next 6 cm to hand, 10 cm spiral with "X" at elbow, and 12 cm spiral Comprilan compression bandages from wrist to axilla.  03/26/2023: Manual therapy Manual lymph drainage to left UE in supine: short neck, right axillary and left inguinal nodes, anterior inter-axillary and anterior inter-axillary pathways. Left UE from fingers to axilla redirecting along pathways. Measured Left UE; see above for circumferential measurements. Compression bandaging to left UE: Applied Eucerin lotion and thick  stockinette. Applied elastomull to all 5 fingers followed by Artiflex to entire hand and arm. Applied 6 cm, 10 cm, and 12 cm Comprilan compression bandages from hand to axilla. No c/o discomfort. Discussed while doing treatment the need for a plan for her holiday travels to the [place]. We discussed various options and because of her recent shoulder replacement, we decided on an arm Farrow wrap and a compression glove. We discussed different compression amounts and decided on 20-30 mmHg knowing that is less than typically recommended but because of her recent surgery and the need to don these independently, we decided this made the most sense. Gave the pt information about getting those ordered from [facility] and told her we could help order them online if they are unable to help. She plans to go there tomorrow.   03/21/23: Manual Therapy Spent beginning of session instructing in pt and CNA in bandaging with demonstration and CNA videorecorded with phone with pts permission. Manual lymph drainage in supine as follows (shortened as time allowed): short neck, superficial and deep abdominals, right axillary nodes, right intact anterior thorax, anterior inter-axillary anastamoses, left inguinal nodes, left axillo-inguinal anastomosis, then only left upper arm as time allowed working from lateral, medial to lateral and lateral again, then redirecting along pathways (avoiding surgical site) Compression bandaging (x 2 today, first time for instruction): cocoa butter from hand to axilla, thick stockinette, elastomull x 1.5 rolls to digits 1-5, artiflex from hand to just below incision, 1 6cm bandage at hand and forearm, 1 10 cm spiral with "X" at elbow from mid wrist to axilla avoiding incision then folded TG soft over  03/19/2023: Manual lymph drainage in supine as follows: short neck, right axillary  nodes, 5 diaphragmatic breaths, anterior inter-axillary anastamoses, Left upper extremity from fingers and  dorsal hand to lateral shoulder redirecting along pathways (avoiding surgical site) Compression bandaging: cocoa butter from hand to axilla, thick stockinette, elastomull to digits 1-5, rosidal from hand to just below incision, 1 6cm bandage at hand and wrist, 1 10 cm from mid forearm to axilla avoiding incision then folded TG soft over  03/15/23 Brief eval performed to have time for treatment due to severity of edema Education on options: CDT with velcro vs bandaging vs compression garments and how those would be very hard to put on at this time.   Discussed with Dwaine Gale PT, and valerie rosenberger PTA about best options and we will be able to start bandaging with addition of some time slots.  Applied lotion, tg soft size medium, fingers 1-5 with mollelast, artiflex x 1 from hand to axilla with extra padding at elbow, 1 6cm hand into spiral up forearm, and 1 10 cm from mid forearm to axilla avoiding incision. TG soft with a large fold comfort.   Education on removing bandages if they get uncomfortable and if they start to slide down and that they won't stay on until Monday most likely.   Pt will bring back all items and use TG soft as able.  Encouraged active wrist and elbow movements.  Assisted pt to donn clothes and sling.    PATIENT EDUCATION:  Education details: per today's note Person educated: Patient Education method: Programmer, multimedia, Demonstration, and Verbal cues Education comprehension: verbalized understanding  HOME EXERCISE PROGRAM: none  ASSESSMENT:  CLINICAL IMPRESSION: Pt having difficulty donning/doffing farrow wrap independently and brought pictures showing wrinkling of skin where garment had slid down and pt was unable to pull up and re tighten velcro. Spent session working on increasing independence with this and by end of session pt was able to don/doff garment and glove independently. Also reviewed exercises from last session. Will make HEP of exercises at next session  so pt can take them on her trip.   OBJECTIVE IMPAIRMENTS: decreased activity tolerance, decreased knowledge of condition, decreased knowledge of use of DME, decreased mobility, decreased ROM, and increased edema.   ACTIVITY LIMITATIONS: carrying, lifting, sleeping, bathing, dressing, and reach over head  PARTICIPATION LIMITATIONS: meal prep, cleaning, laundry, shopping, and community activity  PERSONAL FACTORS: Age and 1 comorbidity: ALND and radiation hx  are also affecting patient's functional outcome.   REHAB POTENTIAL: Excellent  CLINICAL DECISION MAKING: Evolving/moderate complexity  EVALUATION COMPLEXITY: Moderate  GOALS: Goals reviewed with patient? Yes  SHORT TERM GOALS: Target date: 04/08/23    Pt will be ind with self MLD for edema reduction Baseline: Goal status: INITIAL  2.  Pt will decrease volume to or less to demonstrate improving edema status Baseline:  Goal status: INITIAL  3.  Pt will be evaluated and treated for shoulder with goals added as needed as instructed by MD during future visits.  Baseline:  Goal status:MET 04/04/2023  LONG TERM GOALS: Target date: 05/10/23  Pt will reduce to less than volume difference between sides to demonstrate no active lymphedema Baseline:  Goal status: INITIAL  2.  Pt will obtain compression as needed for discharge depending on reduction Baseline:  Goal status: INITIAL   3. Pt will be able to perform AROM  elevation in supine to atleast 120 degrees without compensation  Goal Status; Re-EVAL  4. Pt will be able to place 8 oz object on eye level shelf without UT  compensation  GOALStatus: RE-EVAL  5. Pt will be tolerant of resisted exs for improving UE strength at 7 weeks post op Goal Status; RE-EVAL PLAN:  PT FREQUENCY: 3x/week  PT DURATION:  4 weeks  PLANNED INTERVENTIONS: 97164- PT Re-evaluation, 97110-Therapeutic exercises, 97530- Therapeutic activity, 97112- Neuromuscular re-education, 97535-  Self Care, and [id number]- Manual therapy  PLAN FOR NEXT SESSION: ; MAKE HEP with exercises, remeasure UE - how is donning garment?, Continue complete decongestive therapy and begin AAROM and AROM left shoulder as per protocol; see Media section, next appt with surgeon is 12/17. Pt needs new script for flat knit but should wait to be measured until fibrosis further decreases.   Alvira Monday 04/09/23 1:09 PM

## 2023-04-10 DIAGNOSIS — Z4789 Encounter for other orthopedic aftercare: Secondary | ICD-10-CM | POA: Diagnosis not present

## 2023-04-11 ENCOUNTER — Ambulatory Visit: Payer: Medicare PPO

## 2023-04-11 DIAGNOSIS — Z9889 Other specified postprocedural states: Secondary | ICD-10-CM | POA: Diagnosis not present

## 2023-04-11 DIAGNOSIS — Z4789 Encounter for other orthopedic aftercare: Secondary | ICD-10-CM

## 2023-04-11 DIAGNOSIS — I972 Postmastectomy lymphedema syndrome: Secondary | ICD-10-CM | POA: Diagnosis not present

## 2023-04-11 DIAGNOSIS — M25612 Stiffness of left shoulder, not elsewhere classified: Secondary | ICD-10-CM

## 2023-04-11 DIAGNOSIS — C50511 Malignant neoplasm of lower-outer quadrant of right female breast: Secondary | ICD-10-CM | POA: Diagnosis not present

## 2023-04-11 DIAGNOSIS — Z96612 Presence of left artificial shoulder joint: Secondary | ICD-10-CM

## 2023-04-11 DIAGNOSIS — Z170 Estrogen receptor positive status [ER+]: Secondary | ICD-10-CM | POA: Diagnosis not present

## 2023-04-11 NOTE — Therapy (Signed)
OUTPATIENT PT UPPER EXTREMITY LYMPHEDEMA TREATMENT/RE-eval  Patient Name: Sarah Underwood MRN: [id number] DOB:January 22, 1946, 77 y.o., female Today's Date: 04/11/2023  END OF SESSION:  PT End of Session - 04/11/23 1143     Visit Number 10    Number of Visits 25    Date for PT Re-Evaluation 05/02/23    Authorization Type [employer] Auth needed    Authorization Time Period 12 visits approved through 04/17/2023    Authorization - Visit Number 9    Authorization - Number of Visits 12    PT Start Time 1150    PT Stop Time 1255    PT Time Calculation (min) 65 min    Activity Tolerance Patient tolerated treatment well    Behavior During Therapy [facility] for tasks assessed/performed                 Past Medical History:  Diagnosis Date   Basal cell carcinoma 1994   right side of node   Benign positional vertigo    with rolling over    Celiac disease    Dysrhythmia    Generalized anxiety disorder    GERD (gastroesophageal reflux disease)    Graves disease 2007   thyroid nodule   Hard of hearing    Heart murmur    History of breast cancer 1995   left - treated with mastectomy   History of radiation therapy 2001   37 sessions following recurrence of breast cancer in left chest wall   Hypertension    Knee pain, bilateral 12/14/2014   Major depressive disorder in remission    Mild persistent asthma, uncomplicated 06/29/2015   Nocturia    OA (osteoarthritis) of hip 09/08/2015   Ovarian mass    small solid nodule 8x106mm: Right ovary - stable since 2000, follow u/s do not show this.   Renal mass 2021   followed by Dr. Berneice Heinrich, [facility].  Having every 6 month follow up   Seasonal allergies    Stress fracture of metatarsal bone of left foot 12/15/2019   Vegetarian diet    Past Surgical History:  Procedure Laterality Date   APPENDECTOMY     basal cell removal  7/94   right nose   BREAST BIOPSY Right 1987   benign fibroadenoma   breast cancer recurrence Left 01/2000   at site of  left chest wall after previous mastectomy with removal and treatment with radiaton.   CARPAL TUNNEL RELEASE Right 05/08/2013   Procedure: RIGHT CARPAL TUNNEL RELEASE;  Surgeon: Wyn Forster., MD;  Location: [facility];  Service: Orthopedics;  Laterality: Right;   CATARACT EXTRACTION     03/20/14 left eye, 12/15 right eye   COLPOSCOPY W/ BIOPSY / CURETTAGE  03/12/12   benign, but + HR HPV   EYE SURGERY     bilat cataract surgery    MASTECTOMY Left 3/95   Stage IIb 0/14 LN ER/ PR + with mastectomy - Dr. Mardella Layman   MOHS SURGERY     right nare   OVARY SURGERY  1974   lt   REVERSE SHOULDER ARTHROPLASTY Left 02/26/2023   Procedure: REVERSE SHOULDER ARTHROPLASTY;  Surgeon: Yolonda Kida, MD;  Location: [facility] OR;  Service: Orthopedics;  Laterality: Left;   TONSILLECTOMY AND ADENOIDECTOMY  1950   TOTAL HIP ARTHROPLASTY Right 09/08/2015   Procedure: TOTAL HIP ARTHROPLASTY ANTERIOR APPROACH;  Surgeon: Ollen Gross, MD;  Location: [facility];  Service: Orthopedics;  Laterality: Right;   Patient Active Problem List  Diagnosis Date Noted   Shoulder fracture 02/23/2023   Closed fracture of proximal end of left humerus, unspecified fracture morphology, initial encounter 02/22/2023   Thyroid nodule 02/22/2023   Major depressive disorder in remission 03/29/2020   Generalized anxiety disorder    Benign positional vertigo    Hypertension    Heart murmur    Stress fracture of metatarsal bone of left foot 12/15/2019   History of revision of total replacement of right hip joint 11/26/2017   Leg edema, left 09/26/2016   Post-nasal drainage 03/30/2016   OA (osteoarthritis) of hip 09/08/2015   Mild persistent asthma, uncomplicated 06/29/2015   Hip pain, bilateral 12/14/2014   Knee pain, bilateral 12/14/2014   Postmenopausal atrophic vaginitis 02/23/2013   Graves disease 2007   History of radiation therapy 2001   History of breast cancer 1995   Basal cell carcinoma 1994    PCP:  Alysia Penna, MD  REFERRING PROVIDER: Duwayne Heck, MD  REFERRING DIAG:  Diagnosis  C50.511,Z17.0 (ICD-10-CM) - Malignant neoplasm of lower-outer quadrant of right breast of female, estrogen receptor positive (HCC)  I89.0 (ICD-10-CM) - Lymphedema  Z47.89 orthopedic aftercare  S/P reverse total shoulder arthroplasty, left  Encounter for other orthopedic aftercare  Stiffness of left shoulder, not elsewhere classified  THERAPY DIAG:  Postmastectomy lymphedema  S/P reverse total shoulder arthroplasty, left  Encounter for other orthopedic aftercare  Stiffness of left shoulder, not elsewhere classified  ONSET DATE: 02/26/23  Rationale for Evaluation and Treatment: Rehabilitation  SUBJECTIVE                                                                                                                                                                                           SUBJECTIVE STATEMENT:  I saw Dr. Aundria Rud and he took Xrays. He was ecstatic. He released me to do what I want. I have been wearing the Farrow wap but I have to continue to remove it and pull it up. The back of my hand swells with the glove on and my thumb is still numb. I even tried stretching it but the feeling hasn't come back.   PERTINENT HISTORY: Recent fall with left arm fracture.  Surgery with Reverse TSR 02/26/23.  This is the same side as her mastectomy which was diagnosed in 1995 with chest wall recurrence in 2001.  No remaining lymph nodes.   Shoulder surgery: Dr. Duwayne Heck -See media section for post-op approved exercises and okay for compression therapy.    PAIN:  Are you having pain?  PAIN:  Are you having pain? No   PRECAUTIONS: Can start AROM/AAROM at 03/27/23 and PROM at 2  months (04/23/23), use sling at night until 03/27/23; Can begin passive forward flexion to 90 degrees, scapular retractions, table slides, limiteD ER to neurtral until 4 weeks post op. Starting week 4 post op she can begin  all active and AAROM as tolerated and consider adding foam to bandage at that time for fibrosis at forearm. No PROM until 2 months post op. She can discontinue the sling during the day, use the sling at night for 2 more weeks.   RED FLAGS: None   WEIGHT BEARING RESTRICTIONS: No  FALLS:  Has patient fallen in last 6 months? Yes. Number of falls 1 - does not remember details of her fall. Just remembers she was going outside.   LIVING ENVIRONMENT: Lives with: lives alone Has a CNA for assistance at home.   OCCUPATION: Retired  LEISURE: Not currently exercising  HAND DOMINANCE : Right  PRIOR LEVEL OF FUNCTION: Independent  PATIENT GOALS: prevent chronic lymphedema after this fall   OBJECTIVE (FROM EVAL) Note: Objective measures were completed at Evaluation unless otherwise noted.  COGNITION:  Overall cognitive status: Within functional limits for tasks assessed   PALPATION: +2 pitting edema back of hand, forearm, below, to around the distal 1/3rd of the upper arm.   OBSERVATIONS / OTHER ASSESSMENTS: bruising all fingers and hand, elbow, and upper arm from surgery.  Skin tight and shiny from edema.     POSTURE: wearing sling upon arrival and leaving  UPPER EXTREMITY AROM/PROM: Just starting wrist and elbow AROM - did not measure objectively.  No shoulder ROM allowed.   04/04/2023 Left UE AAROM elevation in supine=   LEFT UE AROM elevation in supine: 100  04/11/2023   LEFT ER AROM supine:17   LEFT IR AROM supine:68 30 deg abd  LYMPHEDEMA ASSESSMENTS:  LANDMARK RIGHT eval  At axilla 30.4  15cm proximal to olecranon 29.4  10 cm proximal to olecranon process 28.3  Olecranon process 24.7  15cm proximal to ulnar styloid 22.1  10 cm proximal to ulnar styloid process 18.7  Just proximal to ulnar styloid process 16.2  Across hand at thumb web space 19.8  At base of 2nd digit 6.4  (Blank rows = not tested)  LANDMARK LEFT  eval LEFT 03/26/2023 LEFT 04/11/2023  At  axilla 33 31 29.6  15cm proximal to olecranon 32.5 29.4 28.8  10 cm proximal to olecranon process 34.5 30.2 28.0  Olecranon process 30.5 26.6 24.0  15cm proximal to ulnar styloid  28.7 25.6 23.0  10 cm proximal to ulnar styloid process 26.5 23.4 20.5  Just proximal to ulnar styloid process 18.5 17.2 15.9  Across hand at thumb web space 20.4 18.8 19.2  At base of 2nd digit 7.0 6.1 5.85  (Blank rows = not tested)  Volume (at eval):  Length 7cm Rt: Lt:   difference   PATIENT SURVEYS:  Quick Dash 68% limited   TODAY'S TREATMENT:  DATE:   04/11/2023 Pt removed farrow wrap and left UE was measured Reviewed supine shoulder flexion AROM, mild A x5 reminding pt to steadily lower arm for less pain and to use bent elbow prn. Pt also practiced supine AROM abd;therapist supporting arm on table but not assisting and pt performed x 5 to approx 50 degrees. Emphasized importance of depressing scapula to prevent compensation. Performed Manual lymph drainage to left UE in supine: short neck, left inguinal nodes, left axillo-inguinal anastomosis, Rt axillary nodes, Rt anterior intact thorax sequence and anterior inter-axillary anastomosis, then left UE working proximal to distal then retracing all steps from fingers to axilla redirecting along pathways. Extra emphasis on dorsum of left hand. Pt applied Farrow wrap with assistance of PT.   04/09/23: Reviewed exercises given at last session and discussed AROM vs AAROM and how both are now ok in this phase of healing as long as no pain  Encouraged pt to move her arm through all ROM as tolerated  Pt demonstrated ER exercise she has been doing as well as supine flexion with elbow bent and therapist educated to use R hand to help with bring arm back to starting position because this is where pt has  pain Had pt practice donning/doffing farrow wrap - educated pt not to leave it on once it slides down and to re adjust and tighten straps which pt was unable to do independently so focused on this today Educated pt to reach under her arm to tighten straps instead of over the top  Adjusted position of velcro markers since pt demonstrates reduction since last session Pt able to demonstrate independent donning/doffing of wrap and glove by end of session  04/04/2023 Pt practiced AROM elbow flexion in standing and was then placed in supine to perform shoulder AROM per protocol. Pt practiced IR and ER to tolerance and indicated that she has been doing this already. Showed her how to perform AROM flexion starting with a bent elbow; therapist stabilized pt at the wrist while she performed x 6, and then performed independently x 10. Pts AROM was measured for recert. Helped pt don new velcro wrap so her caregiver could see how it is supposed to be and how to tighten. Applied velcro strips to the wrap so she would know how far to pull. Velcro Wrap was then removed  Compression bandaging to left UE: Applied cocoa butter to Lt UE and scar, then thick stockinette, next elastomull  to fingers 1-5 (pt with only base of thumb wrapped at pt request  for function)  followed by 1/4" gray foam to dorsal hand/wrist, and then 1/4" gray foam to posterior forearm with small dotted peach Medi foam closer to skin side with Artiflex to entire hand and arm, next 6 cm to hand, 10 cm spiral with "X" at elbow, and 12 cm spiral Comprilan compression bandages from wrist to axilla. Pt advised to remove top wrap if it is too uncomfortable especially at night.  04/02/23:  Manual Therapy Manual lymph drainage to left UE in supine: short neck, left inguinal nodes, left axillo-inguinal anastomosis, Rt axillary nodes, Rt anterior intact thorax sequence and anterior inter-axillary anastomosis, then left UE working proximal to distal then  retracing all steps from fingers to axilla redirecting along pathways. Compression bandaging to left UE: Applied cocoa butter to Lt UE and scar, then thick stockinette, next elastomull  to fingers 2-5 (pt requested leaving her thumb out today for function)  followed by 1/4" gray foam to dorsal  hand/wrist, and then 1/4" gray foam to posterior forearm with small dotted peach Medi foam closer to skin side with Artiflex to entire hand and arm, next 6 cm to hand, 10 cm spiral with "X" at elbow, and 12 cm spiral Comprilan compression bandages from wrist to axilla. Dwaine Gale, PT was present during session to discuss with pt [place] adding reverse shoulder PT to her POC. It was determined that we would need an updated order to include that because our current referral only encompasses PT so Dorothyann Gibbs is going to reach out so when Alvira Monday, PT sees pt at next session she can assess shoulder and add that to our POC if cert back.   03/29/2023 Bandages removed. Hand looked excellent and forearm softer. Above wrap at axilla slightly more red Manual Therapy Manual lymph drainage to left UE in supine: short neck, left inguinal nodes, left axillo-inguinal anastomosis, Rt axillary nodes, Rt anterior intact thorax sequence and anterior inter-axillary anastomosis, then left UE working proximal to distal then retracing all steps from fingers to axilla redirecting along pathways. Compression bandaging to left UE: Applied cocoa butter and thick stockinette, next elastomull  to all 5 fingers followed by 1/4" gray foam to dorsal hand/wrist, and then 1/4" gray foam to forearm with Artiflex to entire hand and arm, next 6 cm to hand, 10 cm spiral with "X" at elbow, and 12 cm spiral Comprilan compression bandages from wrist to axilla. Pt advised she can remove top wrap if bandage is uncomfortable or if it slides down and have someone else rewrap  03/28/23: Manual Therapy Manual lymph drainage to left UE in supine: short neck, left  inguinal nodes, left axillo-inguinal anastomosis, Rt axillary nodes, Rt anterior intact thorax sequence and anterior inter-axillary anastomosis, then left UE working proximal to distal then retracing all steps from fingers to axilla redirecting along pathways. Compression bandaging to left UE: Applied cocoa butter and thick stockinette, next elastomull x 1.5 to all 5 fingers followed by 1/2" gray foam to dorsal hand/wrist, and then 1/4" gray foam to forearm with Artiflex to entire hand and arm, next 6 cm to hand, 10 cm spiral with "X" at elbow, and 12 cm spiral Comprilan compression bandages from wrist to axilla.  03/26/2023: Manual therapy Manual lymph drainage to left UE in supine: short neck, right axillary and left inguinal nodes, anterior inter-axillary and anterior inter-axillary pathways. Left UE from fingers to axilla redirecting along pathways. Measured Left UE; see above for circumferential measurements. Compression bandaging to left UE: Applied Eucerin lotion and thick stockinette. Applied elastomull to all 5 fingers followed by Artiflex to entire hand and arm. Applied 6 cm, 10 cm, and 12 cm Comprilan compression bandages from hand to axilla. No c/o discomfort. Discussed while doing treatment the need for a plan for her holiday travels to the [place]. We discussed various options and because of her recent shoulder replacement, we decided on an arm Farrow wrap and a compression glove. We discussed different compression amounts and decided on 20-30 mmHg knowing that is less than typically recommended but because of her recent surgery and the need to don these independently, we decided this made the most sense. Gave the pt information about getting those ordered from [facility] and told her we could help order them online if they are unable to help. She plans to go there tomorrow.   03/21/23: Manual Therapy Spent beginning of session instructing in pt and CNA in bandaging with demonstration  and CNA videorecorded with  phone with pts permission. Manual lymph drainage in supine as follows (shortened as time allowed): short neck, superficial and deep abdominals, right axillary nodes, right intact anterior thorax, anterior inter-axillary anastamoses, left inguinal nodes, left axillo-inguinal anastomosis, then only left upper arm as time allowed working from lateral, medial to lateral and lateral again, then redirecting along pathways (avoiding surgical site) Compression bandaging (x 2 today, first time for instruction): cocoa butter from hand to axilla, thick stockinette, elastomull x 1.5 rolls to digits 1-5, artiflex from hand to just below incision, 1 6cm bandage at hand and forearm, 1 10 cm spiral with "X" at elbow from mid wrist to axilla avoiding incision then folded TG soft over  03/19/2023: Manual lymph drainage in supine as follows: short neck, right axillary nodes, 5 diaphragmatic breaths, anterior inter-axillary anastamoses, Left upper extremity from fingers and dorsal hand to lateral shoulder redirecting along pathways (avoiding surgical site) Compression bandaging: cocoa butter from hand to axilla, thick stockinette, elastomull to digits 1-5, rosidal from hand to just below incision, 1 6cm bandage at hand and wrist, 1 10 cm from mid forearm to axilla avoiding incision then folded TG soft over  03/15/23 Brief eval performed to have time for treatment due to severity of edema Education on options: CDT with velcro vs bandaging vs compression garments and how those would be very hard to put on at this time.   Discussed with Dwaine Gale PT, and valerie rosenberger PTA about best options and we will be able to start bandaging with addition of some time slots.  Applied lotion, tg soft size medium, fingers 1-5 with mollelast, artiflex x 1 from hand to axilla with extra padding at elbow, 1 6cm hand into spiral up forearm, and 1 10 cm from mid forearm to axilla avoiding incision. TG soft with  a large fold comfort.   Education on removing bandages if they get uncomfortable and if they start to slide down and that they won't stay on until Monday most likely.   Pt will bring back all items and use TG soft as able.  Encouraged active wrist and elbow movements.  Assisted pt to donn clothes and sling.    PATIENT EDUCATION:  Education details: per today's note Person educated: Patient Education method: Programmer, multimedia, Demonstration, and Verbal cues Education comprehension: verbalized understanding  HOME EXERCISE PROGRAM: none  ASSESSMENT:  CLINICAL IMPRESSION: Pts arm is much softer and has reduced nicely. She is improving with AROM for flexion and started supine abd today. She is due for recert next visit. She would benefit from practicing some UE MLD for her trip. Farrow wrap is working well and it is easier for her to exercise her shoulder so she would like to remain in it. She is still complaining of numbness in her thumb after wearing the glove, despite stretching the thumb. Advised to keep hand elevated without glove as much as possible to prevent numbness   OBJECTIVE IMPAIRMENTS: decreased activity tolerance, decreased knowledge of condition, decreased knowledge of use of DME, decreased mobility, decreased ROM, and increased edema.   ACTIVITY LIMITATIONS: carrying, lifting, sleeping, bathing, dressing, and reach over head  PARTICIPATION LIMITATIONS: meal prep, cleaning, laundry, shopping, and community activity  PERSONAL FACTORS: Age and 1 comorbidity: ALND and radiation hx  are also affecting patient's functional outcome.   REHAB POTENTIAL: Excellent  CLINICAL DECISION MAKING: Evolving/moderate complexity  EVALUATION COMPLEXITY: Moderate  GOALS: Goals reviewed with patient? Yes  SHORT TERM GOALS: Target date: 04/08/23    Pt will be  ind with self MLD for edema reduction Baseline: Goal status: INITIAL  2.  Pt will decrease volume to or less to demonstrate  improving edema status Baseline:  Goal status: INITIAL  3.  Pt will be evaluated and treated for shoulder with goals added as needed as instructed by MD during future visits.  Baseline:  Goal status:MET 04/04/2023  LONG TERM GOALS: Target date: 05/10/23  Pt will reduce to less than volume difference between sides to demonstrate no active lymphedema Baseline:  Goal status: INITIAL  2.  Pt will obtain compression as needed for discharge depending on reduction Baseline:  Goal status: MET 04/11/2023   3. Pt will be able to perform AROM  elevation in supine to atleast 120 degrees without compensation  Goal Status; Re-EVAL  4. Pt will be able to place 8 oz object on eye level shelf without UT compensation  GOALStatus: RE-EVAL  5. Pt will be tolerant of resisted exs for improving UE strength at 7 weeks post op Goal Status; RE-EVAL PLAN:  PT FREQUENCY: 3x/week  PT DURATION:  4 weeks  PLANNED INTERVENTIONS: 97164- PT Re-evaluation, 97110-Therapeutic exercises, 97530- Therapeutic activity, 97112- Neuromuscular re-education, 97535- Self Care, and 13244- Manual therapy  PLAN FOR NEXT SESSION: ; MAKE HEP with exercises, Recert, practice MLD with pt, how is donning garment?, Continue complete decongestive therapy and begin AAROM and AROM left shoulder as per protocol; see Media section, next appt with surgeon is 12/17. Pt needs new script for flat knit but should wait to be measured until fibrosis further decreases.   Alvira Monday 04/11/23 1:01 PM

## 2023-04-12 LAB — VITAMIN B6: Vitamin B6: 4.9 ng/mL (ref 2.1–21.7)

## 2023-04-12 LAB — IMMUNOFIXATION ELECTROPHORESIS
IgG (Immunoglobin G), Serum: 1003 mg/dL (ref 600–1540)
IgM, Serum: 35 mg/dL — ABNORMAL LOW (ref 50–300)
Immunoglobulin A: 230 mg/dL (ref 70–320)

## 2023-04-12 LAB — VITAMIN B1: Vitamin B1 (Thiamine): 9 nmol/L (ref 8–30)

## 2023-04-13 ENCOUNTER — Ambulatory Visit: Payer: Medicare PPO

## 2023-04-13 DIAGNOSIS — M25612 Stiffness of left shoulder, not elsewhere classified: Secondary | ICD-10-CM

## 2023-04-13 DIAGNOSIS — Z96612 Presence of left artificial shoulder joint: Secondary | ICD-10-CM

## 2023-04-13 DIAGNOSIS — Z9889 Other specified postprocedural states: Secondary | ICD-10-CM | POA: Diagnosis not present

## 2023-04-13 DIAGNOSIS — Z4789 Encounter for other orthopedic aftercare: Secondary | ICD-10-CM

## 2023-04-13 DIAGNOSIS — C50511 Malignant neoplasm of lower-outer quadrant of right female breast: Secondary | ICD-10-CM | POA: Diagnosis not present

## 2023-04-13 DIAGNOSIS — I972 Postmastectomy lymphedema syndrome: Secondary | ICD-10-CM | POA: Diagnosis not present

## 2023-04-13 DIAGNOSIS — Z170 Estrogen receptor positive status [ER+]: Secondary | ICD-10-CM | POA: Diagnosis not present

## 2023-04-13 NOTE — Therapy (Signed)
OUTPATIENT PT UPPER EXTREMITY LYMPHEDEMA TREATMENT/RE-eval  Patient Name: Sarah Underwood MRN: [id number] DOB:[birth year], 77 y.o., female Today's Date: 04/13/2023  END OF SESSION:  PT End of Session - 04/13/23 1052     Visit Number 11    Number of Visits 25    Date for PT Re-Evaluation 05/02/23    Authorization Type [employer] Auth needed    Authorization Time Period 12 visits approved through 04/17/2023    Authorization - Visit Number 10    Authorization - Number of Visits 12    PT Start Time 1054    PT Stop Time 1157    PT Time Calculation (min) 63 min    Activity Tolerance Patient tolerated treatment well    Behavior During Therapy [facility] for tasks assessed/performed                 Past Medical History:  Diagnosis Date   Basal cell carcinoma 1994   right side of node   Benign positional vertigo    with rolling over    Celiac disease    Dysrhythmia    Generalized anxiety disorder    GERD (gastroesophageal reflux disease)    Graves disease 2007   thyroid nodule   Hard of hearing    Heart murmur    History of breast cancer 1995   left - treated with mastectomy   History of radiation therapy 2001   37 sessions following recurrence of breast cancer in left chest wall   Hypertension    Knee pain, bilateral 12/14/2014   Major depressive disorder in remission    Mild persistent asthma, uncomplicated 06/29/2015   Nocturia    OA (osteoarthritis) of hip 09/08/2015   Ovarian mass    small solid nodule 8x88mm: Right ovary - stable since 2000, follow u/s do not show this.   Renal mass 2021   followed by Dr. Berneice Heinrich, [facility].  Having every 6 month follow up   Seasonal allergies    Stress fracture of metatarsal bone of left foot 12/15/2019   Vegetarian diet    Past Surgical History:  Procedure Laterality Date   APPENDECTOMY     basal cell removal  7/94   right nose   BREAST BIOPSY Right 1987   benign fibroadenoma   breast cancer recurrence Left 01/2000   at site of  left chest wall after previous mastectomy with removal and treatment with radiaton.   CARPAL TUNNEL RELEASE Right 05/08/2013   Procedure: RIGHT CARPAL TUNNEL RELEASE;  Surgeon: Wyn Forster., MD;  Location: [facility];  Service: Orthopedics;  Laterality: Right;   CATARACT EXTRACTION     03/20/14 left eye, 12/15 right eye   COLPOSCOPY W/ BIOPSY / CURETTAGE  03/12/12   benign, but + HR HPV   EYE SURGERY     bilat cataract surgery    MASTECTOMY Left 3/95   Stage IIb 0/14 LN ER/ PR + with mastectomy - Dr. Mardella Layman   MOHS SURGERY     right nare   OVARY SURGERY  1974   lt   REVERSE SHOULDER ARTHROPLASTY Left 02/26/2023   Procedure: REVERSE SHOULDER ARTHROPLASTY;  Surgeon: Yolonda Kida, MD;  Location: [facility];  Service: Orthopedics;  Laterality: Left;   TONSILLECTOMY AND ADENOIDECTOMY  1950   TOTAL HIP ARTHROPLASTY Right 09/08/2015   Procedure: TOTAL HIP ARTHROPLASTY ANTERIOR APPROACH;  Surgeon: Ollen Gross, MD;  Location: [facility];  Service: Orthopedics;  Laterality: Right;   Patient Active Problem List  Diagnosis Date Noted   Shoulder fracture 02/23/2023   Closed fracture of proximal end of left humerus, unspecified fracture morphology, initial encounter 02/22/2023   Thyroid nodule 02/22/2023   Major depressive disorder in remission 03/29/2020   Generalized anxiety disorder    Benign positional vertigo    Hypertension    Heart murmur    Stress fracture of metatarsal bone of left foot 12/15/2019   History of revision of total replacement of right hip joint 11/26/2017   Leg edema, left 09/26/2016   Post-nasal drainage 03/30/2016   OA (osteoarthritis) of hip 09/08/2015   Mild persistent asthma, uncomplicated 06/29/2015   Hip pain, bilateral 12/14/2014   Knee pain, bilateral 12/14/2014   Postmenopausal atrophic vaginitis 02/23/2013   Graves disease 2007   History of radiation therapy 2001   History of breast cancer 1995   Basal cell carcinoma 1994    PCP:  Alysia Penna, MD  REFERRING PROVIDER: Duwayne Heck, MD  REFERRING DIAG:  Diagnosis  C50.511,Z17.0 (ICD-10-CM) - Malignant neoplasm of lower-outer quadrant of right breast of female, estrogen receptor positive (HCC)  I89.0 (ICD-10-CM) - Lymphedema  Z47.89 orthopedic aftercare  S/P reverse total shoulder arthroplasty, left  Encounter for other orthopedic aftercare  Stiffness of left shoulder, not elsewhere classified  THERAPY DIAG:  Postmastectomy lymphedema  S/P reverse total shoulder arthroplasty, left  Encounter for other orthopedic aftercare  Stiffness of left shoulder, not elsewhere classified  ONSET DATE: 02/26/23  Rationale for Evaluation and Treatment: Rehabilitation  SUBJECTIVE                                                                                                                                                                                           SUBJECTIVE STATEMENT:  I brought Dr. Aundria Rud notes; he was able to repair some of the RTC tendons and he has released me to do anything, just to listen to my body. I can start gentle strengthening. I did what you said and removed the glove during the day if I could elevate my hand. I slept without it last night and my hand actually looks better than with the glove; The back of my hand is wrinkled. I practiced driving in my parking lot and noticed I can't turn my head as far left with my left hand on the steering wheel.  PERTINENT HISTORY: Recent fall with left arm fracture.  Surgery with Reverse TSR 02/26/23.  This is the same side as her mastectomy which was diagnosed in 1995 with chest wall recurrence in 2001.  No remaining lymph nodes.   Shoulder surgery: Dr. Duwayne Heck -See media section for post-op approved exercises  and okay for compression therapy.    PAIN:  Are you having pain?  PAIN:  Are you having pain? No   PRECAUTIONS: Can start AROM/AAROM at 03/27/23 and PROM at 2 months (04/23/23), use sling at  night until 03/27/23; Can begin passive forward flexion to 90 degrees, scapular retractions, table slides, limiteD ER to neurtral until 4 weeks post op. Starting week 4 post op she can begin all active and AAROM as tolerated and consider adding foam to bandage at that time for fibrosis at forearm. No PROM until 2 months post op. She can discontinue the sling during the day, use the sling at night for 2 more weeks.   RED FLAGS: None   WEIGHT BEARING RESTRICTIONS: No  FALLS:  Has patient fallen in last 6 months? Yes. Number of falls 1 - does not remember details of her fall. Just remembers she was going outside.   LIVING ENVIRONMENT: Lives with: lives alone Has a CNA for assistance at home.   OCCUPATION: Retired  LEISURE: Not currently exercising  HAND DOMINANCE : Right  PRIOR LEVEL OF FUNCTION: Independent  PATIENT GOALS: prevent chronic lymphedema after this fall   OBJECTIVE (FROM EVAL) Note: Objective measures were completed at Evaluation unless otherwise noted.  COGNITION:  Overall cognitive status: Within functional limits for tasks assessed   PALPATION: +2 pitting edema back of hand, forearm, below, to around the distal 1/3rd of the upper arm.   OBSERVATIONS / OTHER ASSESSMENTS: bruising all fingers and hand, elbow, and upper arm from surgery.  Skin tight and shiny from edema.     POSTURE: wearing sling upon arrival and leaving  UPPER EXTREMITY AROM/PROM: Just starting wrist and elbow AROM - did not measure objectively.  No shoulder ROM allowed.   04/04/2023 Left UE AAROM elevation in supine=   LEFT UE AROM elevation in supine: 100  04/11/2023   LEFT ER AROM supine:17   LEFT IR AROM supine:68 30 deg abd  LYMPHEDEMA ASSESSMENTS:  LANDMARK RIGHT eval  At axilla 30.4  15cm proximal to olecranon 29.4  10 cm proximal to olecranon process 28.3  Olecranon process 24.7  15cm proximal to ulnar styloid 22.1  10 cm proximal to ulnar styloid process 18.7  Just proximal  to ulnar styloid process 16.2  Across hand at thumb web space 19.8  At base of 2nd digit 6.4  (Blank rows = not tested)  LANDMARK LEFT  eval LEFT 03/26/2023 LEFT 04/11/2023  At axilla 33 31 29.6  15cm proximal to olecranon 32.5 29.4 28.8  10 cm proximal to olecranon process 34.5 30.2 28.0  Olecranon process 30.5 26.6 24.0  15cm proximal to ulnar styloid  28.7 25.6 23.0  10 cm proximal to ulnar styloid process 26.5 23.4 20.5  Just proximal to ulnar styloid process 18.5 17.2 15.9  Across hand at thumb web space 20.4 18.8 19.2  At base of 2nd digit 7.0 6.1 5.85  (Blank rows = not tested)  Volume (at eval):  Length 7cm Rt: Lt:   difference   PATIENT SURVEYS:  Quick Dash 68% limited   TODAY'S TREATMENT:  DATE:  04/13/2023 Pt performed AA shoulder ROM in standing using opposite hand to help and mirror and was instructed in slow lowering using other hand as well. She also practiced shoulder low forward punch AROM, and shoulder abduction in limited ROM using mirror to prevent compensation. Pt was instructed in isometric shoulder flex, abd, ext, IR and ER from neutral position 5 reps with 5 sec hold and was given illustrated and written instructions. Advised to start gently and avoid pain. Performed Manual lymph drainage to left UE in supine: short neck, left inguinal nodes, left axillo-inguinal anastomosis, Rt axillary nodes, Rt anterior intact thorax sequence and anterior inter-axillary anastomosis, then left UE working proximal to distal then retracing all steps from fingers to axilla redirecting along pathways. Extra emphasis on dorsum of left hand. PT applied Farrow wrap with assistance of pt. To pull up.  04/11/2023 Pt removed farrow wrap and left UE was measured Reviewed supine shoulder flexion AROM, mild A x5 reminding pt to  steadily lower arm for less pain and to use bent elbow prn. Pt also practiced supine AROM abd;therapist supporting arm on table but not assisting and pt performed x 5 to approx 50 degrees. Emphasized importance of depressing scapula to prevent compensation. Performed Manual lymph drainage to left UE in supine: short neck, left inguinal nodes, left axillo-inguinal anastomosis, Rt axillary nodes, Rt anterior intact thorax sequence and anterior inter-axillary anastomosis, then left UE working proximal to distal then retracing all steps from fingers to axilla redirecting along pathways. Extra emphasis on dorsum of left hand. Pt applied Farrow wrap with assistance of PT.   04/09/23: Reviewed exercises given at last session and discussed AROM vs AAROM and how both are now ok in this phase of healing as long as no pain  Encouraged pt to move her arm through all ROM as tolerated  Pt demonstrated ER exercise she has been doing as well as supine flexion with elbow bent and therapist educated to use R hand to help with bring arm back to starting position because this is where pt has pain Had pt practice donning/doffing farrow wrap - educated pt not to leave it on once it slides down and to re adjust and tighten straps which pt was unable to do independently so focused on this today Educated pt to reach under her arm to tighten straps instead of over the top  Adjusted position of velcro markers since pt demonstrates reduction since last session Pt able to demonstrate independent donning/doffing of wrap and glove by end of session  04/04/2023 Pt practiced AROM elbow flexion in standing and was then placed in supine to perform shoulder AROM per protocol. Pt practiced IR and ER to tolerance and indicated that she has been doing this already. Showed her how to perform AROM flexion starting with a bent elbow; therapist stabilized pt at the wrist while she performed x 6, and then performed independently x 10. Pts  AROM was measured for recert. Helped pt don new velcro wrap so her caregiver could see how it is supposed to be and how to tighten. Applied velcro strips to the wrap so she would know how far to pull. Velcro Wrap was then removed  Compression bandaging to left UE: Applied cocoa butter to Lt UE and scar, then thick stockinette, next elastomull  to fingers 1-5 (pt with only base of thumb wrapped at pt request  for function)  followed by 1/4" gray foam to dorsal hand/wrist, and then 1/4" gray foam to posterior  forearm with small dotted peach Medi foam closer to skin side with Artiflex to entire hand and arm, next 6 cm to hand, 10 cm spiral with "X" at elbow, and 12 cm spiral Comprilan compression bandages from wrist to axilla. Pt advised to remove top wrap if it is too uncomfortable especially at night.  04/02/23:  Manual Therapy Manual lymph drainage to left UE in supine: short neck, left inguinal nodes, left axillo-inguinal anastomosis, Rt axillary nodes, Rt anterior intact thorax sequence and anterior inter-axillary anastomosis, then left UE working proximal to distal then retracing all steps from fingers to axilla redirecting along pathways. Compression bandaging to left UE: Applied cocoa butter to Lt UE and scar, then thick stockinette, next elastomull  to fingers 2-5 (pt requested leaving her thumb out today for function)  followed by 1/4" gray foam to dorsal hand/wrist, and then 1/4" gray foam to posterior forearm with small dotted peach Medi foam closer to skin side with Artiflex to entire hand and arm, next 6 cm to hand, 10 cm spiral with "X" at elbow, and 12 cm spiral Comprilan compression bandages from wrist to axilla. Dwaine Gale, PT was present during session to discuss with pt [place] adding reverse shoulder PT to her POC. It was determined that we would need an updated order to include that because our current referral only encompasses PT so Dorothyann Gibbs is going to reach out so when Alvira Monday, PT sees  pt at next session she can assess shoulder and add that to our POC if cert back.   03/29/2023 Bandages removed. Hand looked excellent and forearm softer. Above wrap at axilla slightly more red Manual Therapy Manual lymph drainage to left UE in supine: short neck, left inguinal nodes, left axillo-inguinal anastomosis, Rt axillary nodes, Rt anterior intact thorax sequence and anterior inter-axillary anastomosis, then left UE working proximal to distal then retracing all steps from fingers to axilla redirecting along pathways. Compression bandaging to left UE: Applied cocoa butter and thick stockinette, next elastomull  to all 5 fingers followed by 1/4" gray foam to dorsal hand/wrist, and then 1/4" gray foam to forearm with Artiflex to entire hand and arm, next 6 cm to hand, 10 cm spiral with "X" at elbow, and 12 cm spiral Comprilan compression bandages from wrist to axilla. Pt advised she can remove top wrap if bandage is uncomfortable or if it slides down and have someone else rewrap  03/28/23: Manual Therapy Manual lymph drainage to left UE in supine: short neck, left inguinal nodes, left axillo-inguinal anastomosis, Rt axillary nodes, Rt anterior intact thorax sequence and anterior inter-axillary anastomosis, then left UE working proximal to distal then retracing all steps from fingers to axilla redirecting along pathways. Compression bandaging to left UE: Applied cocoa butter and thick stockinette, next elastomull x 1.5 to all 5 fingers followed by 1/2" gray foam to dorsal hand/wrist, and then 1/4" gray foam to forearm with Artiflex to entire hand and arm, next 6 cm to hand, 10 cm spiral with "X" at elbow, and 12 cm spiral Comprilan compression bandages from wrist to axilla.  03/26/2023: Manual therapy Manual lymph drainage to left UE in supine: short neck, right axillary and left inguinal nodes, anterior inter-axillary and anterior inter-axillary pathways. Left UE from fingers to axilla redirecting  along pathways. Measured Left UE; see above for circumferential measurements. Compression bandaging to left UE: Applied Eucerin lotion and thick stockinette. Applied elastomull to all 5 fingers followed by Artiflex to entire hand and arm. Applied 6 cm,  10 cm, and 12 cm Comprilan compression bandages from hand to axilla. No c/o discomfort. Discussed while doing treatment the need for a plan for her holiday travels to the [place]. We discussed various options and because of her recent shoulder replacement, we decided on an arm Farrow wrap and a compression glove. We discussed different compression amounts and decided on 20-30 mmHg knowing that is less than typically recommended but because of her recent surgery and the need to don these independently, we decided this made the most sense. Gave the pt information about getting those ordered from [facility] and told her we could help order them online if they are unable to help. She plans to go there tomorrow.    PATIENT EDUCATION:  Access Code: [email or web address] URL: [email or web address] Date: 04/13/2023 Prepared by: Alvira Monday  Exercises - Standing Isometric Shoulder Internal Rotation at Doorway  - 1 x daily - 7 x weekly - 3 sets - 10 reps - Standing Isometric Shoulder External Rotation with Doorway  - 1 x daily - 7 x weekly - 1 sets - 5-10 reps - Isometric Shoulder Flexion at Wall  - 1 x daily - 7 x weekly - 1 sets - 5-10 reps - 5 hold - Isometric Shoulder Extension at Wall  - 1 x daily - 7 x weekly - 1 sets - 5-10 reps - Isometric Shoulder Abduction at Wall  - 1 x daily - 7 x weekly - 1 sets - 5-10 reps Education details: per today's note Person educated: Patient Education method: Explanation, Demonstration, and Verbal cues Education comprehension: verbalized understanding  HOME EXERCISE PROGRAM: none  ASSESSMENT:  CLINICAL IMPRESSION: Pt has been using her arm slightly more and being cognizant of avoiding pain and using  scapular depression to prevent compensation. Farrow wrap was pulled up slightly from wrist to get higher at upper arm so that it stays up better. Pt will require recert next week.  OBJECTIVE IMPAIRMENTS: decreased activity tolerance, decreased knowledge of condition, decreased knowledge of use of DME, decreased mobility, decreased ROM, and increased edema.   ACTIVITY LIMITATIONS: carrying, lifting, sleeping, bathing, dressing, and reach over head  PARTICIPATION LIMITATIONS: meal prep, cleaning, laundry, shopping, and community activity  PERSONAL FACTORS: Age and 1 comorbidity: ALND and radiation hx  are also affecting patient's functional outcome.   REHAB POTENTIAL: Excellent  CLINICAL DECISION MAKING: Evolving/moderate complexity  EVALUATION COMPLEXITY: Moderate  GOALS: Goals reviewed with patient? Yes  SHORT TERM GOALS: Target date: 04/08/23    Pt will be ind with self MLD for edema reduction Baseline: Goal status: INITIAL  2.  Pt will decrease volume to or less to demonstrate improving edema status Baseline:  Goal status: INITIAL  3.  Pt will be evaluated and treated for shoulder with goals added as needed as instructed by MD during future visits.  Baseline:  Goal status:MET 04/04/2023  LONG TERM GOALS: Target date: 05/10/23  Pt will reduce to less than volume difference between sides to demonstrate no active lymphedema Baseline:  Goal status: INITIAL  2.  Pt will obtain compression as needed for discharge depending on reduction Baseline:  Goal status: MET 04/11/2023   3. Pt will be able to perform AROM  elevation in supine to atleast 120 degrees without compensation  Goal Status; Re-EVAL  4. Pt will be able to place 8 oz object on eye level shelf without UT compensation  GOALStatus: RE-EVAL  5. Pt will be tolerant of resisted exs for improving UE strength at  7 weeks post op Goal Status; RE-EVAL PLAN:  PT FREQUENCY: 3x/week  PT DURATION:  4  weeks  PLANNED INTERVENTIONS: 97164- PT Re-evaluation, 97110-Therapeutic exercises, 97530- Therapeutic activity, 97112- Neuromuscular re-education, 97535- Self Care, and [id number]- Manual therapy  PLAN FOR NEXT SESSION: ; review supine, standing exs, Recert, practice MLD with pt, how is donning garment?, Continue complete decongestive therapy and begin AAROM and AROM left shoulder as per protocol; see Media section, next apt with surgeon 05/21/2022. Pt needs new script for flat knit but should wait to be measured until fibrosis further decreases.   Alvira Monday 04/13/23 12:06 PM

## 2023-04-24 ENCOUNTER — Ambulatory Visit: Payer: Medicare PPO

## 2023-04-24 ENCOUNTER — Encounter: Payer: Self-pay | Admitting: Neurology

## 2023-04-24 DIAGNOSIS — Z9889 Other specified postprocedural states: Secondary | ICD-10-CM | POA: Diagnosis not present

## 2023-04-24 DIAGNOSIS — Z170 Estrogen receptor positive status [ER+]: Secondary | ICD-10-CM | POA: Diagnosis not present

## 2023-04-24 DIAGNOSIS — I972 Postmastectomy lymphedema syndrome: Secondary | ICD-10-CM

## 2023-04-24 DIAGNOSIS — Z4789 Encounter for other orthopedic aftercare: Secondary | ICD-10-CM

## 2023-04-24 DIAGNOSIS — Z96612 Presence of left artificial shoulder joint: Secondary | ICD-10-CM

## 2023-04-24 DIAGNOSIS — M25612 Stiffness of left shoulder, not elsewhere classified: Secondary | ICD-10-CM

## 2023-04-24 DIAGNOSIS — C50511 Malignant neoplasm of lower-outer quadrant of right female breast: Secondary | ICD-10-CM | POA: Diagnosis not present

## 2023-04-24 NOTE — Therapy (Signed)
 OUTPATIENT PT UPPER EXTREMITY LYMPHEDEMA TREATMENT  Patient Name: Sarah Underwood MRN: [id number] DOB:11/28/1945, 77 y.o., female Today's Date: 04/24/2023  END OF SESSION:  PT End of Session - 04/24/23 1008     Visit Number 12    Number of Visits 25    Date for PT Re-Evaluation 05/02/23    Authorization - Visit Number 11    Authorization - Number of Visits 12    PT Start Time 1006    PT Stop Time 1103    PT Time Calculation (min) 57 min    Activity Tolerance Patient tolerated treatment well    Behavior During Therapy WFL for tasks assessed/performed                 Past Medical History:  Diagnosis Date   Basal cell carcinoma 1994   right side of node   Benign positional vertigo    with rolling over    Celiac disease    Dysrhythmia    Generalized anxiety disorder    GERD (gastroesophageal reflux disease)    Graves disease 2007   thyroid  nodule   Hard of hearing    Heart murmur    History of breast cancer 1995   left - treated with mastectomy   History of radiation therapy 2001   37 sessions following recurrence of breast cancer in left chest wall   Hypertension    Knee pain, bilateral 12/14/2014   Major depressive disorder in remission    Mild persistent asthma, uncomplicated 06/29/2015   Nocturia    OA (osteoarthritis) of hip 09/08/2015   Ovarian mass    small solid nodule 8x17mm: Right ovary - stable since 2000, follow u/s do not show this.   Renal mass 2021   followed by Dr. Alvaro, [facility].  Having every 6 month follow up   Seasonal allergies    Stress fracture of metatarsal bone of left foot 12/15/2019   Vegetarian diet    Past Surgical History:  Procedure Laterality Date   APPENDECTOMY     basal cell removal  7/94   right nose   BREAST BIOPSY Right 1987   benign fibroadenoma   breast cancer recurrence Left 01/2000   at site of left chest wall after previous mastectomy with removal and treatment with radiaton.   CARPAL TUNNEL RELEASE Right  05/08/2013   Procedure: RIGHT CARPAL TUNNEL RELEASE;  Surgeon: Lamar LULLA Leonor Mickey., MD;  Location: [facility];  Service: Orthopedics;  Laterality: Right;   CATARACT EXTRACTION     03/20/14 left eye, 12/15 right eye   COLPOSCOPY W/ BIOPSY / CURETTAGE  03/12/12   benign, but + HR HPV   EYE SURGERY     bilat cataract surgery    MASTECTOMY Left 3/95   Stage IIb 0/14 LN ER/ PR + with mastectomy - Dr. Morna   MOHS SURGERY     right nare   OVARY SURGERY  1974   lt   REVERSE SHOULDER ARTHROPLASTY Left 02/26/2023   Procedure: REVERSE SHOULDER ARTHROPLASTY;  Surgeon: Sharl Selinda Dover, MD;  Location: [facility] OR;  Service: Orthopedics;  Laterality: Left;   TONSILLECTOMY AND ADENOIDECTOMY  1950   TOTAL HIP ARTHROPLASTY Right 09/08/2015   Procedure: TOTAL HIP ARTHROPLASTY ANTERIOR APPROACH;  Surgeon: Dempsey Moan, MD;  Location: [facility];  Service: Orthopedics;  Laterality: Right;   Patient Active Problem List   Diagnosis Date Noted   Shoulder fracture 02/23/2023   Closed fracture of proximal end of left humerus,  unspecified fracture morphology, initial encounter 02/22/2023   Thyroid  nodule 02/22/2023   Major depressive disorder in remission 03/29/2020   Generalized anxiety disorder    Benign positional vertigo    Hypertension    Heart murmur    Stress fracture of metatarsal bone of left foot 12/15/2019   History of revision of total replacement of right hip joint 11/26/2017   Leg edema, left 09/26/2016   Post-nasal drainage 03/30/2016   OA (osteoarthritis) of hip 09/08/2015   Mild persistent asthma, uncomplicated 06/29/2015   Hip pain, bilateral 12/14/2014   Knee pain, bilateral 12/14/2014   Postmenopausal atrophic vaginitis 02/23/2013   Graves disease 2007   History of radiation therapy 2001   History of breast cancer 1995   Basal cell carcinoma 1994    PCP: Glendia Freeman, MD  REFERRING PROVIDER: Selinda Gosling, MD  REFERRING DIAG:  Diagnosis  C50.511,Z17.0  (ICD-10-CM) - Malignant neoplasm of lower-outer quadrant of right breast of female, estrogen receptor positive (HCC)  I89.0 (ICD-10-CM) - Lymphedema  Z47.89 orthopedic aftercare  S/P reverse total shoulder arthroplasty, left  Encounter for other orthopedic aftercare  Stiffness of left shoulder, not elsewhere classified  THERAPY DIAG:  Postmastectomy lymphedema  S/P reverse total shoulder arthroplasty, left  Encounter for other orthopedic aftercare  Stiffness of left shoulder, not elsewhere classified  ONSET DATE: 02/26/23  Rationale for Evaluation and Treatment: Rehabilitation  SUBJECTIVE                                                                                                                                                                                           SUBJECTIVE STATEMENT:  I tripped yesterday. I didn't fall but the way I tensed up has made me a bit sore in my Lt UT today. I've been doing the exercises Grayce gave me last time and I was surprised at how sore I got after those simple exercises. I've also been helping to lift my Lt arm with my Rt because I can't lift it against gravity yet. So I try to let the Lt arm lower itself guiding it with the Rt. We are continuing to work on the garment and I think overall it's doing better.   PERTINENT HISTORY: Recent fall with left arm fracture.  Surgery with Reverse TSR 02/26/23.  This is the same side as her mastectomy which was diagnosed in 1995 with chest wall recurrence in 2001.  No remaining lymph nodes.   Shoulder surgery: Dr. Selinda Gosling -See media section for post-op approved exercises and okay for compression therapy.    PAIN:  Are you having pain?  PAIN:  Are you having pain?  No   PRECAUTIONS: Can start AROM/AAROM at 03/27/23 and PROM at 2 months (04/23/23), use sling at night until 03/27/23; Can begin passive forward flexion to 90 degrees, scapular retractions, table slides, limiteD ER to neurtral until 4 weeks  post op. Starting week 4 post op she can begin all active and AAROM as tolerated and consider adding foam to bandage at that time for fibrosis at forearm. No PROM until 2 months post op. She can discontinue the sling during the day, use the sling at night for 2 more weeks.   RED FLAGS: None   WEIGHT BEARING RESTRICTIONS: No  FALLS:  Has patient fallen in last 6 months? Yes. Number of falls 1 - does not remember details of her fall. Just remembers she was going outside.   LIVING ENVIRONMENT: Lives with: lives alone Has a CNA for assistance at home.   OCCUPATION: Retired  LEISURE: Not currently exercising  HAND DOMINANCE : Right  PRIOR LEVEL OF FUNCTION: Independent  PATIENT GOALS: prevent chronic lymphedema after this fall   OBJECTIVE (FROM EVAL) Note: Objective measures were completed at Evaluation unless otherwise noted.  COGNITION:  Overall cognitive status: Within functional limits for tasks assessed   PALPATION: +2 pitting edema back of hand, forearm, below, to around the distal 1/3rd of the upper arm.   OBSERVATIONS / OTHER ASSESSMENTS: bruising all fingers and hand, elbow, and upper arm from surgery.  Skin tight and shiny from edema.     POSTURE: wearing sling upon arrival and leaving  UPPER EXTREMITY AROM/PROM: Just starting wrist and elbow AROM - did not measure objectively.  No shoulder ROM allowed.   04/04/2023 Left UE AAROM elevation in supine=   LEFT UE AROM elevation in supine: 100  04/11/2023   LEFT ER AROM supine:17   LEFT IR AROM supine:68 30 deg abd  LYMPHEDEMA ASSESSMENTS:  LANDMARK RIGHT eval  At axilla 30.4  15cm proximal to olecranon 29.4  10 cm proximal to olecranon process 28.3  Olecranon process 24.7  15cm proximal to ulnar styloid 22.1  10 cm proximal to ulnar styloid process 18.7  Just proximal to ulnar styloid process 16.2  Across hand at thumb web space 19.8  At base of 2nd digit 6.4  (Blank rows = not tested)  LANDMARK LEFT   eval LEFT 03/26/2023 LEFT 04/11/2023  At axilla 33 31 29.6  15cm proximal to olecranon 32.5 29.4 28.8  10 cm proximal to olecranon process 34.5 30.2 28.0  Olecranon process 30.5 26.6 24.0  15cm proximal to ulnar styloid  28.7 25.6 23.0  10 cm proximal to ulnar styloid process 26.5 23.4 20.5  Just proximal to ulnar styloid process 18.5 17.2 15.9  Across hand at thumb web space 20.4 18.8 19.2  At base of 2nd digit 7.0 6.1 5.85  (Blank rows = not tested)  Volume (at eval):  Length 7cm Rt: Lt:   difference   PATIENT SURVEYS:  Quick Dash 68% limited   TODAY'S TREATMENT:  DATE:  04/24/23: Therapeutic Exercises Reviewed current HEP that pt is doing at home. Pulleys into flex x 2 mins with VC's and tactile cues for how to decrease Lt scapular compensations, and then slight scaption x 2 mins. Pt reports no pain, just feels stiffness. Isometrics with therapist holding the towel for pt against door frame for Lt flex, er, abd and ext 3 sec holds x 10 each with cuing to decrease compensations and hold head erect Manual Therapy Manual lymph drainage to left UE in supine: short neck, left inguinal nodes, left axillo-inguinal anastomosis, Rt axillary nodes, Rt anterior intact thorax sequence and anterior inter-axillary anastomosis, then left UE working proximal to distal then retracing all steps from fingers to axilla redirecting along pathways. P/ROM briefly to Lt shoulder into flex, scaption and er to pts tolerance PTA applied Farrow wrap with assistance of pt to pull up  04/13/2023 Pt performed AA shoulder ROM in standing using opposite hand to help and mirror and was instructed in slow lowering using other hand as well. She also practiced shoulder low forward punch AROM, and shoulder abduction in limited ROM using mirror to prevent  compensation. Pt was instructed in isometric shoulder flex, abd, ext, IR and ER from neutral position 5 reps with 5 sec hold and was given illustrated and written instructions. Advised to start gently and avoid pain. Performed Manual lymph drainage to left UE in supine: short neck, left inguinal nodes, left axillo-inguinal anastomosis, Rt axillary nodes, Rt anterior intact thorax sequence and anterior inter-axillary anastomosis, then left UE working proximal to distal then retracing all steps from fingers to axilla redirecting along pathways. Extra emphasis on dorsum of left hand. PT applied Farrow wrap with assistance of pt. To pull up.  04/11/2023 Pt removed farrow wrap and left UE was measured Reviewed supine shoulder flexion AROM, mild A x5 reminding pt to steadily lower arm for less pain and to use bent elbow prn. Pt also practiced supine AROM abd;therapist supporting arm on table but not assisting and pt performed x 5 to approx 50 degrees. Emphasized importance of depressing scapula to prevent compensation. Performed Manual lymph drainage to left UE in supine: short neck, left inguinal nodes, left axillo-inguinal anastomosis, Rt axillary nodes, Rt anterior intact thorax sequence and anterior inter-axillary anastomosis, then left UE working proximal to distal then retracing all steps from fingers to axilla redirecting along pathways. Extra emphasis on dorsum of left hand. Pt applied Farrow wrap with assistance of PT.   04/09/23: Reviewed exercises given at last session and discussed AROM vs AAROM and how both are now ok in this phase of healing as long as no pain  Encouraged pt to move her arm through all ROM as tolerated  Pt demonstrated ER exercise she has been doing as well as supine flexion with elbow bent and therapist educated to use R hand to help with bring arm back to starting position because this is where pt has pain Had pt practice donning/doffing farrow wrap - educated pt not to  leave it on once it slides down and to re adjust and tighten straps which pt was unable to do independently so focused on this today Educated pt to reach under her arm to tighten straps instead of over the top  Adjusted position of velcro markers since pt demonstrates reduction since last session Pt able to demonstrate independent donning/doffing of wrap and glove by end of session        PATIENT EDUCATION:  Access Code: [email or web address] URL:  [email or web address] Date: 04/13/2023 Prepared by: Grayce Sheldon  Exercises - Standing Isometric Shoulder Internal Rotation at Doorway  - 1 x daily - 7 x weekly - 3 sets - 10 reps - Standing Isometric Shoulder External Rotation with Doorway  - 1 x daily - 7 x weekly - 1 sets - 5-10 reps - Isometric Shoulder Flexion at Wall  - 1 x daily - 7 x weekly - 1 sets - 5-10 reps - 5 hold - Isometric Shoulder Extension at Wall  - 1 x daily - 7 x weekly - 1 sets - 5-10 reps - Isometric Shoulder Abduction at Wall  - 1 x daily - 7 x weekly - 1 sets - 5-10 reps Education details: per today's note Person educated: Patient Education method: Explanation, Demonstration, and Verbal cues Education comprehension: verbalized understanding  HOME EXERCISE PROGRAM: none  ASSESSMENT:  CLINICAL IMPRESSION: Pt had a good trip to visit family in [place] for the holiday. She was able to manage her lymphedema very well. Very mild fibrosis present today at lateral forearm, much improved from before. She is alos pleased with the amount of motion she has started getting back since Dr. Sharl cleared her. Today reviewed isomoetrics which she required min cuing to not lean with body and to decrease Lt scapular compensations. Added pulleys which she did well with. Also continued with MLD of Lt UE and assist pt with donning pts velcro garment at end of session.   OBJECTIVE IMPAIRMENTS: decreased activity tolerance, decreased knowledge of condition, decreased knowledge of use of  DME, decreased mobility, decreased ROM, and increased edema.   ACTIVITY LIMITATIONS: carrying, lifting, sleeping, bathing, dressing, and reach over head  PARTICIPATION LIMITATIONS: meal prep, cleaning, laundry, shopping, and community activity  PERSONAL FACTORS: Age and 1 comorbidity: ALND and radiation hx  are also affecting patient's functional outcome.   REHAB POTENTIAL: Excellent  CLINICAL DECISION MAKING: Evolving/moderate complexity  EVALUATION COMPLEXITY: Moderate  GOALS: Goals reviewed with patient? Yes  SHORT TERM GOALS: Target date: 04/08/23    Pt will be ind with self MLD for edema reduction Baseline: Goal status: INITIAL  2.  Pt will decrease volume to or less to demonstrate improving edema status Baseline:  Goal status: INITIAL  3.  Pt will be evaluated and treated for shoulder with goals added as needed as instructed by MD during future visits.  Baseline:  Goal status:MET 04/04/2023  LONG TERM GOALS: Target date: 05/10/23  Pt will reduce to less than volume difference between sides to demonstrate no active lymphedema Baseline:  Goal status: INITIAL  2.  Pt will obtain compression as needed for discharge depending on reduction Baseline:  Goal status: MET 04/11/2023   3. Pt will be able to perform AROM  elevation in supine to atleast 120 degrees without compensation  Goal Status; Re-EVAL  4. Pt will be able to place 8 oz object on eye level shelf without UT compensation  GOALStatus: RE-EVAL  5. Pt will be tolerant of resisted exs for improving UE strength at 7 weeks post op Goal Status; RE-EVAL PLAN:  PT FREQUENCY: 3x/week  PT DURATION:  4 weeks  PLANNED INTERVENTIONS: 97164- PT Re-evaluation, 97110-Therapeutic exercises, 97530- Therapeutic activity, 97112- Neuromuscular re-education, 97535- Self Care, and [id number]- Manual therapy  PLAN FOR NEXT SESSION: Review supine, standing exs, Recert, practice MLD with pt, Continue complete  decongestive therapy and begin AAROM and AROM left shoulder as per protocol; see Media section, next apt with surgeon 05/21/2022. Pt needs new script for  flat knit but should wait to be measured until fibrosis further decreases.   Berwyn Knights, PTA 04/24/23 11:07 AM

## 2023-04-28 ENCOUNTER — Ambulatory Visit
Admission: RE | Admit: 2023-04-28 | Discharge: 2023-04-28 | Disposition: A | Discharge status: Home / Self Care | Payer: Medicare PPO | Source: Ambulatory Visit | Attending: Neurology | Admitting: Neurology

## 2023-04-28 DIAGNOSIS — M47816 Spondylosis without myelopathy or radiculopathy, lumbar region: Secondary | ICD-10-CM | POA: Diagnosis not present

## 2023-04-28 DIAGNOSIS — W19XXXS Unspecified fall, sequela: Secondary | ICD-10-CM

## 2023-04-28 DIAGNOSIS — R2689 Other abnormalities of gait and mobility: Secondary | ICD-10-CM

## 2023-04-28 DIAGNOSIS — G629 Polyneuropathy, unspecified: Secondary | ICD-10-CM

## 2023-04-28 DIAGNOSIS — R200 Anesthesia of skin: Secondary | ICD-10-CM

## 2023-04-28 DIAGNOSIS — G8929 Other chronic pain: Secondary | ICD-10-CM

## 2023-04-30 ENCOUNTER — Ambulatory Visit: Payer: Medicare PPO | Attending: Orthopedic Surgery

## 2023-04-30 DIAGNOSIS — Z96612 Presence of left artificial shoulder joint: Secondary | ICD-10-CM | POA: Diagnosis not present

## 2023-04-30 DIAGNOSIS — M25612 Stiffness of left shoulder, not elsewhere classified: Secondary | ICD-10-CM | POA: Diagnosis not present

## 2023-04-30 DIAGNOSIS — I972 Postmastectomy lymphedema syndrome: Secondary | ICD-10-CM | POA: Insufficient documentation

## 2023-04-30 DIAGNOSIS — Z4789 Encounter for other orthopedic aftercare: Secondary | ICD-10-CM | POA: Diagnosis not present

## 2023-04-30 NOTE — Therapy (Signed)
 OUTPATIENT PT UPPER EXTREMITY LYMPHEDEMA TREATMENT  Patient Name: Sarah Underwood MRN: [id number] DOB:05-27-1945, 78 y.o., female Today's Date: 04/30/2023  END OF SESSION:  PT End of Session - 04/30/23 1050     Visit Number 13    Number of Visits 25    Date for PT Re-Evaluation 05/28/23    Authorization Type [employer] Auth needed    Authorization - Visit Number 12    Authorization - Number of Visits 12    PT Start Time 1055    PT Stop Time 1157    PT Time Calculation (min) 62 min    Activity Tolerance Patient tolerated treatment well    Behavior During Therapy WFL for tasks assessed/performed                 Past Medical History:  Diagnosis Date   Basal cell carcinoma 1994   right side of node   Benign positional vertigo    with rolling over    Celiac disease    Dysrhythmia    Generalized anxiety disorder    GERD (gastroesophageal reflux disease)    Graves disease 2007   thyroid  nodule   Hard of hearing    Heart murmur    History of breast cancer 1995   left - treated with mastectomy   History of radiation therapy 2001   37 sessions following recurrence of breast cancer in left chest wall   Hypertension    Knee pain, bilateral 12/14/2014   Major depressive disorder in remission    Mild persistent asthma, uncomplicated 06/29/2015   Nocturia    OA (osteoarthritis) of hip 09/08/2015   Ovarian mass    small solid nodule 8x67mm: Right ovary - stable since 2000, follow u/s do not show this.   Renal mass 2021   followed by Dr. Alvaro, [facility].  Having every 6 month follow up   Seasonal allergies    Stress fracture of metatarsal bone of left foot 12/15/2019   Vegetarian diet    Past Surgical History:  Procedure Laterality Date   APPENDECTOMY     basal cell removal  7/94   right nose   BREAST BIOPSY Right 1987   benign fibroadenoma   breast cancer recurrence Left 01/2000   at site of left chest wall after previous mastectomy with removal and treatment with  radiaton.   CARPAL TUNNEL RELEASE Right 05/08/2013   Procedure: RIGHT CARPAL TUNNEL RELEASE;  Surgeon: Lamar LULLA Leonor Mickey., MD;  Location: [facility];  Service: Orthopedics;  Laterality: Right;   CATARACT EXTRACTION     03/20/14 left eye, 12/15 right eye   COLPOSCOPY W/ BIOPSY / CURETTAGE  03/12/12   benign, but + HR HPV   EYE SURGERY     bilat cataract surgery    MASTECTOMY Left 3/95   Stage IIb 0/14 LN ER/ PR + with mastectomy - Dr. Morna   MOHS SURGERY     right nare   OVARY SURGERY  1974   lt   REVERSE SHOULDER ARTHROPLASTY Left 02/26/2023   Procedure: REVERSE SHOULDER ARTHROPLASTY;  Surgeon: Sharl Selinda Dover, MD;  Location: [facility] OR;  Service: Orthopedics;  Laterality: Left;   TONSILLECTOMY AND ADENOIDECTOMY  1950   TOTAL HIP ARTHROPLASTY Right 09/08/2015   Procedure: TOTAL HIP ARTHROPLASTY ANTERIOR APPROACH;  Surgeon: Dempsey Moan, MD;  Location: [facility]S;  Service: Orthopedics;  Laterality: Right;   Patient Active Problem List   Diagnosis Date Noted   Shoulder fracture 02/23/2023  Closed fracture of proximal end of left humerus, unspecified fracture morphology, initial encounter 02/22/2023   Thyroid  nodule 02/22/2023   Major depressive disorder in remission 03/29/2020   Generalized anxiety disorder    Benign positional vertigo    Hypertension    Heart murmur    Stress fracture of metatarsal bone of left foot 12/15/2019   History of revision of total replacement of right hip joint 11/26/2017   Leg edema, left 09/26/2016   Post-nasal drainage 03/30/2016   OA (osteoarthritis) of hip 09/08/2015   Mild persistent asthma, uncomplicated 06/29/2015   Hip pain, bilateral 12/14/2014   Knee pain, bilateral 12/14/2014   Postmenopausal atrophic vaginitis 02/23/2013   Graves disease 2007   History of radiation therapy 2001   History of breast cancer 1995   Basal cell carcinoma 1994    PCP: Glendia Freeman, MD  REFERRING PROVIDER: Selinda Gosling, MD  REFERRING  DIAG:  Diagnosis  C50.511,Z17.0 (ICD-10-CM) - Malignant neoplasm of lower-outer quadrant of right breast of female, estrogen receptor positive (HCC)  I89.0 (ICD-10-CM) - Lymphedema  Z47.89 orthopedic aftercare  S/P reverse total shoulder arthroplasty, left  Encounter for other orthopedic aftercare  Stiffness of left shoulder, not elsewhere classified  THERAPY DIAG:  Postmastectomy lymphedema  S/P reverse total shoulder arthroplasty, left  Encounter for other orthopedic aftercare  Stiffness of left shoulder, not elsewhere classified  ONSET DATE: 02/26/23  Rationale for Evaluation and Treatment: Rehabilitation  SUBJECTIVE                                                                                                                                                                                           SUBJECTIVE STATEMENT:  I washed my velcro wrap and it took 24 hours to dry so I have not been in the wrap. I did wear the TG soft that has a little compression but its not the same. On my trip my legs bothered me so I tended to use my arms. I had to use my arms to help get up from a low toilet. I had to carry or lift things with travel that my muscles weren't ready for. When I got home my shoulder had muscle aches/soreness. It has gotten better. I have been swelling under the left arm some, and it gets slightly itchy but the dermatologist said it isn't fungus. I am not very good with the MLD but I do my arm all the time. There is still a fibrotic area on my forearm and I think the upper arm is a little swollen. My ROM in my left shoulder is much better. I can carry things in my left  hand. I can lift light things with my left hand. I can use the bannister going down the stairs, and I can use light pressure on my cane. I can wash dishes and bathe myself, and dress myself.   PERTINENT HISTORY: Recent fall with left arm fracture.  Surgery with Reverse TSR 02/26/23.  This is the same side as  her mastectomy which was diagnosed in 1995 with chest wall recurrence in 2001.  No remaining lymph nodes.   Shoulder surgery: Dr. Selinda Gosling -See media section for post-op approved exercises and okay for compression therapy.    PAIN:  Are you having pain?  PAIN:  Are you having pain? Yesif I move in certain directions NPRS scale: 0/10 at best but reaches 3-4/10 with certain motions of my shoulder Pain location: left shoulder Pain orientation: Left  PAIN TYPE: aching, sharp, and tight Pain description: intermittent  Aggravating factors: reaching in certain directions, bed mobility, Relieving factors: rest, relaxing muscles    PRECAUTIONS: Can start AROM/AAROM at 03/27/23 and PROM at 2 months (04/23/23), use sling at night until 03/27/23; Can begin passive forward flexion to 90 degrees, scapular retractions, table slides, limiteD ER to neurtral until 4 weeks post op. Starting week 4 post op she can begin all active and AAROM as tolerated and consider adding foam to bandage at that time for fibrosis at forearm. No PROM until 2 months post op. She can discontinue the sling during the day, use the sling at night for 2 more weeks.   RED FLAGS: None   WEIGHT BEARING RESTRICTIONS: No  FALLS:  Has patient fallen in last 6 months? Yes. Number of falls 1 - does not remember details of her fall. Just remembers she was going outside.   LIVING ENVIRONMENT: Lives with: lives alone Has a CNA for assistance at home.   OCCUPATION: Retired  LEISURE: Not currently exercising  HAND DOMINANCE : Right  PRIOR LEVEL OF FUNCTION: Independent  PATIENT GOALS: prevent chronic lymphedema after this fall   OBJECTIVE (FROM EVAL) Note: Objective measures were completed at Evaluation unless otherwise noted.  COGNITION:  Overall cognitive status: Within functional limits for tasks assessed   PALPATION: +2 pitting edema back of hand, forearm, below, to around the distal 1/3rd of the upper arm.    OBSERVATIONS / OTHER ASSESSMENTS: bruising all fingers and hand, elbow, and upper arm from surgery.  Skin tight and shiny from edema.     POSTURE: wearing sling upon arrival and leaving  UPPER EXTREMITY AROM/PROM: Just starting wrist and elbow AROM - did not measure objectively.  No shoulder ROM allowed.   04/04/2023 Left UE AAROM elevation in supine=   LEFT UE AROM elevation in supine: 100  04/11/2023   LEFT ER AROM supine:17   LEFT IR AROM supine:68 30 deg abd      IN SUPINE 04/30/2023 Left shoulder flex AROM in supine;  117 with elbow flexed to initiate   IR AROM  to stomach    ER  AROM 30 degrees at 30 degrees abduction     LYMPHEDEMA ASSESSMENTS:  LANDMARK RIGHT eval  At axilla 30.4  15cm proximal to olecranon 29.4  10 cm proximal to olecranon process 28.3  Olecranon process 24.7  15cm proximal to ulnar styloid 22.1  10 cm proximal to ulnar styloid process 18.7  Just proximal to ulnar styloid process 16.2  Across hand at thumb web space 19.8  At base of 2nd digit 6.4  (Blank rows = not tested)  [facility]  LEFT  eval LEFT 03/26/2023 LEFT 04/11/2023 LEFT  16/2025  At axilla 33 31 29.6 29.7  15cm proximal to olecranon 32.5 29.4 28.8 28.4  10 cm proximal to olecranon process 34.5 30.2 28.0 27.6  Olecranon process 30.5 26.6 24.0 23.4  15cm proximal to ulnar styloid  28.7 25.6 23.0 23.0  10 cm proximal to ulnar styloid process 26.5 23.4 20.5 21.1  Just proximal to ulnar styloid process 18.5 17.2 15.9 15.8  Across hand at thumb web space 20.4 18.8 19.2 19.0  At base of 2nd digit 7.0 6.1 5.85 6.0  (Blank rows = not tested)  Volume (at eval):          Length 7cm Rt:                         Lt:                      04/30/2023  1596 ml difference   PATIENT SURVEYS:  Quick Dash 68% limited EVAL 04/29/2022     34%   TODAY'S TREATMENT:                                                                                                                                          DATE:   04/30/2023 Measured arm circumference and AROM of left shoulder for re;eval Reviewed goals with pt and pt progress. Reviewed isometrics briefly with pt at her request showing her proper way of standing close to wall and adjusting pressure according to how her shoulder feels Manual lymph drainage to left UE in supine: short neck, left inguinal nodes, left axillo-inguinal anastomosis, Rt axillary nodes, Rt anterior intact thorax sequence and anterior inter-axillary anastomosis, then left UE working proximal to distal then retracing all steps from fingers to axilla redirecting along pathways. PT applied Farrow wrap with assistance of pt to pull up  04/24/23: Therapeutic Exercises Reviewed current HEP that pt is doing at home. Pulleys into flex x 2 mins with VC's and tactile cues for how to decrease Lt scapular compensations, and then slight scaption x 2 mins. Pt reports no pain, just feels stiffness. Isometrics with therapist holding the towel for pt against door frame for Lt flex, er, abd and ext 3 sec holds x 10 each with cuing to decrease compensations and hold head erect Manual Therapy Manual lymph drainage to left UE in supine: short neck, left inguinal nodes, left axillo-inguinal anastomosis, Rt axillary nodes, Rt anterior intact thorax sequence and anterior inter-axillary anastomosis, then left UE working proximal to distal then retracing all steps from fingers to axilla redirecting along pathways. P/ROM briefly to Lt shoulder into flex, scaption and er to pts tolerance PTA applied Farrow wrap with assistance of pt to pull up  04/13/2023 Pt performed AA shoulder ROM in standing using opposite hand to help  and mirror and was instructed in slow lowering using other hand as well. She also practiced shoulder low forward punch AROM, and shoulder abduction in limited ROM using mirror to prevent compensation. Pt was instructed in isometric shoulder flex, abd, ext, IR and  ER from neutral position 5 reps with 5 sec hold and was given illustrated and written instructions. Advised to start gently and avoid pain. Performed Manual lymph drainage to left UE in supine: short neck, left inguinal nodes, left axillo-inguinal anastomosis, Rt axillary nodes, Rt anterior intact thorax sequence and anterior inter-axillary anastomosis, then left UE working proximal to distal then retracing all steps from fingers to axilla redirecting along pathways. Extra emphasis on dorsum of left hand. PT applied Farrow wrap with assistance of pt. To pull up.  04/11/2023 Pt removed farrow wrap and left UE was measured Reviewed supine shoulder flexion AROM, mild A x5 reminding pt to steadily lower arm for less pain and to use bent elbow prn. Pt also practiced supine AROM abd;therapist supporting arm on table but not assisting and pt performed x 5 to approx 50 degrees. Emphasized importance of depressing scapula to prevent compensation. Performed Manual lymph drainage to left UE in supine: short neck, left inguinal nodes, left axillo-inguinal anastomosis, Rt axillary nodes, Rt anterior intact thorax sequence and anterior inter-axillary anastomosis, then left UE working proximal to distal then retracing all steps from fingers to axilla redirecting along pathways. Extra emphasis on dorsum of left hand. Pt applied Farrow wrap with assistance of PT.   04/09/23: Reviewed exercises given at last session and discussed AROM vs AAROM and how both are now ok in this phase of healing as long as no pain  Encouraged pt to move her arm through all ROM as tolerated  Pt demonstrated ER exercise she has been doing as well as supine flexion with elbow bent and therapist educated to use R hand to help with bring arm back to starting position because this is where pt has pain Had pt practice donning/doffing farrow wrap - educated pt not to leave it on once it slides down and to re adjust and tighten straps which pt was  unable to do independently so focused on this today Educated pt to reach under her arm to tighten straps instead of over the top  Adjusted position of velcro markers since pt demonstrates reduction since last session Pt able to demonstrate independent donning/doffing of wrap and glove by end of session        PATIENT EDUCATION:  Access Code: [email or web address] URL: [email or web address] Date: 04/13/2023 Prepared by: Grayce Sheldon  Exercises - Standing Isometric Shoulder Internal Rotation at Doorway  - 1 x daily - 7 x weekly - 3 sets - 10 reps - Standing Isometric Shoulder External Rotation with Doorway  - 1 x daily - 7 x weekly - 1 sets - 5-10 reps - Isometric Shoulder Flexion at Wall  - 1 x daily - 7 x weekly - 1 sets - 5-10 reps - 5 hold - Isometric Shoulder Extension at Wall  - 1 x daily - 7 x weekly - 1 sets - 5-10 reps - Isometric Shoulder Abduction at Wall  - 1 x daily - 7 x weekly - 1 sets - 5-10 reps Education details: per today's note Person educated: Patient Education method: Explanation, Demonstration, and Verbal cues Education comprehension: verbalized understanding  HOME EXERCISE PROGRAM: none  ASSESSMENT:  CLINICAL IMPRESSION: Pt reevaluated today for UE lymphedema and Shoulder ROM. Functionally Her ROM from left TSA  has improved nicely as noted with 34% improvement in quick dash. She is now able to dress, wash dishes, lift and carry light objecs. She is able to live independently without assist of caregiver. She is still lacking ROM, and left UE strength and is not able to raise her arm in sitting or standing. She is not yet able to drive. Despite not wearing her velcro wrap for a day while it was drying her arm did not increase in size significantly while she wore her TG soft. She has reduced now to 1596 ml, nearly equal to the other arm. She has achieved 2/3 STG's and 2/6 LTG's. She will benefit from skilled PT especially addressing shoulder ROM and  strengthening and updating HEP prn, in addition to MLD as needed fo swelling and reviewing with pt.  OBJECTIVE IMPAIRMENTS: decreased activity tolerance, decreased knowledge of condition, decreased knowledge of use of DME, decreased mobility, decreased ROM, and increased edema.   ACTIVITY LIMITATIONS: carrying, lifting, sleeping, bathing, dressing, and reach over head  PARTICIPATION LIMITATIONS: meal prep, cleaning, laundry, shopping, and community activity  PERSONAL FACTORS: Age and 1 comorbidity: ALND and radiation hx  are also affecting patient's functional outcome.   REHAB POTENTIAL: Excellent  CLINICAL DECISION MAKING: Evolving/moderate complexity  EVALUATION COMPLEXITY: Moderate  GOALS: Goals reviewed with patient? Yes  SHORT TERM GOALS: Target date: 04/08/23    Pt will be ind with self MLD for edema reduction Baseline: Goal status: In progress 2.  Pt will decrease volume to or less to demonstrate improving edema status Baseline:  Goal status: MET 1596  (8 ml more) 04/30/2023 3.  Pt will be evaluated and treated for shoulder with goals added as needed as instructed by MD during future visits.  Baseline:  Goal status:MET 04/04/2023  LONG TERM GOALS: Target date: 05/10/23  Pt will reduce to less than volume difference between sides to demonstrate no active lymphedema Baseline:  Goal status: MET 04/29/2022 (1596 ml) 2.  Pt will obtain compression as needed for discharge depending on reduction Baseline:  Goal status: MET 04/11/2023   3. Pt will be able to perform AROM  elevation in supine to atleast 120 degrees without compensation  Goal Status; In progress, 117 04/30/2023  4. Pt will be able to place 8 oz object on eye level shelf without UT compensation  GOALStatus: In progress  5. Pt will be tolerant of resisted exs for improving UE strength at 7 weeks post op Goal Status; In progress ;started isometrics 6. Pt will be able to return to driving for greater  independence Goal status: new   PLAN:  PT FREQUENCY: 3x/week  PT DURATION:  4 weeks  PLANNED INTERVENTIONS: 97164- PT Re-evaluation, 97110-Therapeutic exercises, 97530- Therapeutic activity, 97112- Neuromuscular re-education, 97535- Self Care, and [id number]- Manual therapy  PLAN FOR NEXT SESSION: Review supine, standing exs, progress to strength, practice MLD with pt, Continue complete decongestive therapy and begin AAROM and AROM left shoulder as per protocol; see Media section, next apt with surgeon 05/21/2022. Pt needs new script for flat knit but should wait to be measured until fibrosis further decreases.   Grayce Sheldon, PT 04/30/23 5:54 PM

## 2023-05-02 ENCOUNTER — Ambulatory Visit: Payer: Medicare PPO

## 2023-05-02 DIAGNOSIS — M25612 Stiffness of left shoulder, not elsewhere classified: Secondary | ICD-10-CM

## 2023-05-02 DIAGNOSIS — I972 Postmastectomy lymphedema syndrome: Secondary | ICD-10-CM

## 2023-05-02 DIAGNOSIS — Z96612 Presence of left artificial shoulder joint: Secondary | ICD-10-CM

## 2023-05-02 DIAGNOSIS — Z4789 Encounter for other orthopedic aftercare: Secondary | ICD-10-CM

## 2023-05-02 NOTE — Patient Instructions (Addendum)
 Marland Kitchen

## 2023-05-02 NOTE — Therapy (Signed)
 OUTPATIENT PT UPPER EXTREMITY LYMPHEDEMA TREATMENT  Patient Name: Sarah Underwood MRN: [id number] DOB:10-10-1945, 78 y.o., female Today's Date: 05/02/2023  END OF SESSION:  PT End of Session - 05/02/23 1154     Visit Number 14    Number of Visits 25    Date for PT Re-Evaluation 05/28/23    PT Start Time 1107    PT Stop Time 1215    PT Time Calculation (min) 68 min    Activity Tolerance Patient tolerated treatment well    Behavior During Therapy [facility] for tasks assessed/performed                 Past Medical History:  Diagnosis Date   Basal cell carcinoma 1994   right side of node   Benign positional vertigo    with rolling over    Celiac disease    Dysrhythmia    Generalized anxiety disorder    GERD (gastroesophageal reflux disease)    Graves disease 2007   thyroid  nodule   Hard of hearing    Heart murmur    History of breast cancer 1995   left - treated with mastectomy   History of radiation therapy 2001   37 sessions following recurrence of breast cancer in left chest wall   Hypertension    Knee pain, bilateral 12/14/2014   Major depressive disorder in remission    Mild persistent asthma, uncomplicated 06/29/2015   Nocturia    OA (osteoarthritis) of hip 09/08/2015   Ovarian mass    small solid nodule 8x26mm: Right ovary - stable since 2000, follow u/s do not show this.   Renal mass 2021   followed by Dr. Alvaro, [facility].  Having every 6 month follow up   Seasonal allergies    Stress fracture of metatarsal bone of left foot 12/15/2019   Vegetarian diet    Past Surgical History:  Procedure Laterality Date   APPENDECTOMY     basal cell removal  7/94   right nose   BREAST BIOPSY Right 1987   benign fibroadenoma   breast cancer recurrence Left 01/2000   at site of left chest wall after previous mastectomy with removal and treatment with radiaton.   CARPAL TUNNEL RELEASE Right 05/08/2013   Procedure: RIGHT CARPAL TUNNEL RELEASE;  Surgeon: Lamar LULLA Leonor Mickey., MD;  Location: [facility];  Service: Orthopedics;  Laterality: Right;   CATARACT EXTRACTION     03/20/14 left eye, 12/15 right eye   COLPOSCOPY W/ BIOPSY / CURETTAGE  03/12/12   benign, but + HR HPV   EYE SURGERY     bilat cataract surgery    MASTECTOMY Left 3/95   Stage IIb 0/14 LN ER/ PR + with mastectomy - Dr. Morna   MOHS SURGERY     right nare   OVARY SURGERY  1974   lt   REVERSE SHOULDER ARTHROPLASTY Left 02/26/2023   Procedure: REVERSE SHOULDER ARTHROPLASTY;  Surgeon: Sharl Selinda Dover, MD;  Location: [facility] OR;  Service: Orthopedics;  Laterality: Left;   TONSILLECTOMY AND ADENOIDECTOMY  1950   TOTAL HIP ARTHROPLASTY Right 09/08/2015   Procedure: TOTAL HIP ARTHROPLASTY ANTERIOR APPROACH;  Surgeon: Dempsey Moan, MD;  Location: [facility] ORS;  Service: Orthopedics;  Laterality: Right;   Patient Active Problem List   Diagnosis Date Noted   Shoulder fracture 02/23/2023   Closed fracture of proximal end of left humerus, unspecified fracture morphology, initial encounter 02/22/2023   Thyroid  nodule 02/22/2023   Major depressive disorder in  remission 03/29/2020   Generalized anxiety disorder    Benign positional vertigo    Hypertension    Heart murmur    Stress fracture of metatarsal bone of left foot 12/15/2019   History of revision of total replacement of right hip joint 11/26/2017   Leg edema, left 09/26/2016   Post-nasal drainage 03/30/2016   OA (osteoarthritis) of hip 09/08/2015   Mild persistent asthma, uncomplicated 06/29/2015   Hip pain, bilateral 12/14/2014   Knee pain, bilateral 12/14/2014   Postmenopausal atrophic vaginitis 02/23/2013   Graves disease 2007   History of radiation therapy 2001   History of breast cancer 1995   Basal cell carcinoma 1994    PCP: Glendia Freeman, MD  REFERRING PROVIDER: Selinda Gosling, MD  REFERRING DIAG:  Diagnosis  C50.511,Z17.0 (ICD-10-CM) - Malignant neoplasm of lower-outer quadrant of right breast of female,  estrogen receptor positive (HCC)  I89.0 (ICD-10-CM) - Lymphedema  Z47.89 orthopedic aftercare  S/P reverse total shoulder arthroplasty, left  Encounter for other orthopedic aftercare  Stiffness of left shoulder, not elsewhere classified  THERAPY DIAG:  Postmastectomy lymphedema  S/P reverse total shoulder arthroplasty, left  Encounter for other orthopedic aftercare  Stiffness of left shoulder, not elsewhere classified  ONSET DATE: 02/26/23  Rationale for Evaluation and Treatment: Rehabilitation  SUBJECTIVE                                                                                                                                                                                           SUBJECTIVE STATEMENT:  Overall I can tell my Lt shoulder is moving better each day. I am doing my stretches each day and wearing the velcro compression garment. Grayce reminded me that I really need to be doing the self MLD daily to help decrease the lymphedema in my arm which will especially help that area that fills up the most right above my medial elbow.    PERTINENT HISTORY: Recent fall with left arm fracture.  Surgery with Reverse TSR 02/26/23.  This is the same side as her mastectomy which was diagnosed in 1995 with chest wall recurrence in 2001.  No remaining lymph nodes.   Shoulder surgery: Dr. Selinda Gosling -See media section for post-op approved exercises and okay for compression therapy.    PAIN:  Are you having pain?  PAIN:  Are you having pain? Yesif I move in certain directions NPRS scale: 0/10 at best but reaches 3-4/10 with certain motions of my shoulder Pain location: left shoulder Pain orientation: Left  PAIN TYPE: aching, sharp, and tight Pain description: intermittent  Aggravating factors: reaching in certain directions, bed mobility, Relieving factors: rest,  relaxing muscles    PRECAUTIONS: Can start AROM/AAROM at 03/27/23 and PROM at 2 months (04/23/23), use sling at  night until 03/27/23; Can begin passive forward flexion to 90 degrees, scapular retractions, table slides, limiteD ER to neurtral until 4 weeks post op. Starting week 4 post op she can begin all active and AAROM as tolerated and consider adding foam to bandage at that time for fibrosis at forearm. No PROM until 2 months post op. She can discontinue the sling during the day, use the sling at night for 2 more weeks.   RED FLAGS: None   WEIGHT BEARING RESTRICTIONS: No  FALLS:  Has patient fallen in last 6 months? Yes. Number of falls 1 - does not remember details of her fall. Just remembers she was going outside.   LIVING ENVIRONMENT: Lives with: lives alone Has a CNA for assistance at home.   OCCUPATION: Retired  LEISURE: Not currently exercising  HAND DOMINANCE : Right  PRIOR LEVEL OF FUNCTION: Independent  PATIENT GOALS: prevent chronic lymphedema after this fall   OBJECTIVE (FROM EVAL) Note: Objective measures were completed at Evaluation unless otherwise noted.  COGNITION:  Overall cognitive status: Within functional limits for tasks assessed   PALPATION: +2 pitting edema back of hand, forearm, below, to around the distal 1/3rd of the upper arm.   OBSERVATIONS / OTHER ASSESSMENTS: bruising all fingers and hand, elbow, and upper arm from surgery.  Skin tight and shiny from edema.     POSTURE: wearing sling upon arrival and leaving  UPPER EXTREMITY AROM/PROM: Just starting wrist and elbow AROM - did not measure objectively.  No shoulder ROM allowed.   04/04/2023 Left UE AAROM elevation in supine=   LEFT UE AROM elevation in supine: 100  04/11/2023   LEFT ER AROM supine:17   LEFT IR AROM supine:68 30 deg abd      IN SUPINE 04/30/2023 Left shoulder flex AROM in supine;  117 with elbow flexed to initiate   IR AROM  to stomach    ER  AROM 30 degrees at 30 degrees abduction     LYMPHEDEMA ASSESSMENTS:  LANDMARK RIGHT eval  At axilla 30.4  15cm proximal to olecranon  29.4  10 cm proximal to olecranon process 28.3  Olecranon process 24.7  15cm proximal to ulnar styloid 22.1  10 cm proximal to ulnar styloid process 18.7  Just proximal to ulnar styloid process 16.2  Across hand at thumb web space 19.8  At base of 2nd digit 6.4  (Blank rows = not tested)  LANDMARK LEFT  eval LEFT 03/26/2023 LEFT 04/11/2023 LEFT  16/2025  At axilla 33 31 29.6 29.7  15cm proximal to olecranon 32.5 29.4 28.8 28.4  10 cm proximal to olecranon process 34.5 30.2 28.0 27.6  Olecranon process 30.5 26.6 24.0 23.4  15cm proximal to ulnar styloid  28.7 25.6 23.0 23.0  10 cm proximal to ulnar styloid process 26.5 23.4 20.5 21.1  Just proximal to ulnar styloid process 18.5 17.2 15.9 15.8  Across hand at thumb web space 20.4 18.8 19.2 19.0  At base of 2nd digit 7.0 6.1 5.85 6.0  (Blank rows = not tested)  Volume (at eval):          Length 7cm Rt:                         Lt:   04/30/2023  1596 ml difference   PATIENT SURVEYS:  Quick Dash 68% limited EVAL 04/29/2022     34%   TODAY'S TREATMENT:                                                                                                                                         DATE:  05/02/23: Self Care Spent time reviewing the lymphatic system and that her accumulation at medial upper arm is probable due to now damaged lymphatic collectors from injury and then surgery. Answered pts questions regarding this. Then reviewed the MLD sequence by having pt return demo while therapist stood in front of pt having her return each step and explaining the sequence while performing. Instructed her that she can spend more time in area of fibrosis at medial upper arm. Pt was able to return good demo. Also offered pressure checks so pt would realize the lightness of the pressure and skin stretch, not slide. Issued handout as well.  Therapeutic Exercises Dowel exercises in standing in front of mirror into  flex (done in both ways holid dowle horz and then vertically) x 5-7 reps each, then Lt UE abd x 5 reps returning therapist demo and VC's to work on decreasing Lt scapular compensation as much as able (this will be some limited though always due to nature of her surgery) Pulleys into flex and abd x 2 mins each reviewing proper technique and to hold stretch at end of each rep. Pt reports feeling excellent stretch with these so instructed her how to order these on [employer] for continued home use.   04/30/2023 Measured arm circumference and AROM of left shoulder for re;eval Reviewed goals with pt and pt progress. Reviewed isometrics briefly with pt at her request showing her proper way of standing close to wall and adjusting pressure according to how her shoulder feels Manual lymph drainage to left UE in supine: short neck, left inguinal nodes, left axillo-inguinal anastomosis, Rt axillary nodes, Rt anterior intact thorax sequence and anterior inter-axillary anastomosis, then left UE working proximal to distal then retracing all steps from fingers to axilla redirecting along pathways. PT applied Farrow wrap with assistance of pt to pull up  04/24/23: Therapeutic Exercises Reviewed current HEP that pt is doing at home. Pulleys into flex x 2 mins with VC's and tactile cues for how to decrease Lt scapular compensations, and then slight scaption x 2 mins. Pt reports no pain, just feels stiffness. Isometrics with therapist holding the towel for pt against door frame for Lt flex, er, abd and ext 3 sec holds x 10 each with cuing to decrease compensations and hold head erect Manual Therapy Manual lymph drainage to left UE in supine: short neck, left inguinal nodes, left axillo-inguinal anastomosis, Rt axillary nodes, Rt anterior intact thorax sequence and anterior inter-axillary anastomosis, then left UE working proximal to distal then retracing all steps from  fingers to axilla redirecting along pathways. P/ROM  briefly to Lt shoulder into flex, scaption and er to pts tolerance PTA applied Farrow wrap with assistance of pt to pull up  04/13/2023 Pt performed AA shoulder ROM in standing using opposite hand to help and mirror and was instructed in slow lowering using other hand as well. She also practiced shoulder low forward punch AROM, and shoulder abduction in limited ROM using mirror to prevent compensation. Pt was instructed in isometric shoulder flex, abd, ext, IR and ER from neutral position 5 reps with 5 sec hold and was given illustrated and written instructions. Advised to start gently and avoid pain. Performed Manual lymph drainage to left UE in supine: short neck, left inguinal nodes, left axillo-inguinal anastomosis, Rt axillary nodes, Rt anterior intact thorax sequence and anterior inter-axillary anastomosis, then left UE working proximal to distal then retracing all steps from fingers to axilla redirecting along pathways. Extra emphasis on dorsum of left hand. PT applied Farrow wrap with assistance of pt. To pull up.  04/11/2023 Pt removed farrow wrap and left UE was measured Reviewed supine shoulder flexion AROM, mild A x5 reminding pt to steadily lower arm for less pain and to use bent elbow prn. Pt also practiced supine AROM abd;therapist supporting arm on table but not assisting and pt performed x 5 to approx 50 degrees. Emphasized importance of depressing scapula to prevent compensation. Performed Manual lymph drainage to left UE in supine: short neck, left inguinal nodes, left axillo-inguinal anastomosis, Rt axillary nodes, Rt anterior intact thorax sequence and anterior inter-axillary anastomosis, then left UE working proximal to distal then retracing all steps from fingers to axilla redirecting along pathways. Extra emphasis on dorsum of left hand. Pt applied Farrow wrap with assistance of PT.   04/09/23: Reviewed exercises given at last session and discussed AROM vs AAROM and how both  are now ok in this phase of healing as long as no pain  Encouraged pt to move her arm through all ROM as tolerated  Pt demonstrated ER exercise she has been doing as well as supine flexion with elbow bent and therapist educated to use R hand to help with bring arm back to starting position because this is where pt has pain Had pt practice donning/doffing farrow wrap - educated pt not to leave it on once it slides down and to re adjust and tighten straps which pt was unable to do independently so focused on this today Educated pt to reach under her arm to tighten straps instead of over the top  Adjusted position of velcro markers since pt demonstrates reduction since last session Pt able to demonstrate independent donning/doffing of wrap and glove by end of session        PATIENT EDUCATION:  Access Code: [email or web address] URL: [email or web address] Date: 04/13/2023 Prepared by: Grayce Sheldon  Exercises - Standing Isometric Shoulder Internal Rotation at Doorway  - 1 x daily - 7 x weekly - 3 sets - 10 reps - Standing Isometric Shoulder External Rotation with Doorway  - 1 x daily - 7 x weekly - 1 sets - 5-10 reps - Isometric Shoulder Flexion at Wall  - 1 x daily - 7 x weekly - 1 sets - 5-10 reps - 5 hold - Isometric Shoulder Extension at Wall  - 1 x daily - 7 x weekly - 1 sets - 5-10 reps - Isometric Shoulder Abduction at Wall  - 1 x daily - 7 x weekly - 1 sets - 5-10 reps  05/02/23: Education details: Self MLD and standing dowel exercises Person educated: Patient Education method: Explanation, Demonstration, and Verbal cues, and tactile cues, handout issued Education comprehension: verbalized understanding, returned demonstration, tactile and VC's and pt will benefit from further review  HOME EXERCISE PROGRAM: Isometrics for Lt shoulder Standing dowel exercises Self MLD  ASSESSMENT:  CLINICAL IMPRESSION: Spent beginning of session reviewing basics of anatomy of lymphatic  system and explaining how this leads to the more consistent accumulation at her medial upper arm, reviewing self MLD, and had pt return demo of this while cuing her. Then progressed HEP to add standing dowel exercises that she was encouraged to do in front of her mirror at home to assess Lt scapular compensation. Also pt is planning to order over the door pulleys for home use. Pt will benefit from further review of the self MLD sequence, albeit briefer review will be needed, as she reported having better understanding after today's session and continued progression of A/ROM and strength as able for her Lt shoulder per POC.   OBJECTIVE IMPAIRMENTS: decreased activity tolerance, decreased knowledge of condition, decreased knowledge of use of DME, decreased mobility, decreased ROM, and increased edema.   ACTIVITY LIMITATIONS: carrying, lifting, sleeping, bathing, dressing, and reach over head  PARTICIPATION LIMITATIONS: meal prep, cleaning, laundry, shopping, and community activity  PERSONAL FACTORS: Age and 1 comorbidity: ALND and radiation hx  are also affecting patient's functional outcome.   REHAB POTENTIAL: Excellent  CLINICAL DECISION MAKING: Evolving/moderate complexity  EVALUATION COMPLEXITY: Moderate  GOALS: Goals reviewed with patient? Yes  SHORT TERM GOALS: Target date: 04/08/23    Pt will be ind with self MLD for edema reduction Baseline: Goal status: In progress 2.  Pt will decrease volume to or less to demonstrate improving edema status Baseline:  Goal status: MET 1596  (8 ml more) 04/30/2023 3.  Pt will be evaluated and treated for shoulder with goals added as needed as instructed by MD during future visits.  Baseline:  Goal status:MET 04/04/2023  LONG TERM GOALS: Target date: 05/10/23  Pt will reduce to less than volume difference between sides to demonstrate no active lymphedema Baseline:  Goal status: MET 04/29/2022 (1596 ml) 2.  Pt will obtain compression as  needed for discharge depending on reduction Baseline:  Goal status: MET 04/11/2023   3. Pt will be able to perform AROM  elevation in supine to atleast 120 degrees without compensation  Goal Status; In progress, 117 04/30/2023  4. Pt will be able to place 8 oz object on eye level shelf without UT compensation  GOALStatus: In progress  5. Pt will be tolerant of resisted exs for improving UE strength at 7 weeks post op Goal Status; In progress ;started isometrics 6. Pt will be able to return to driving for greater independence Goal status: new   PLAN:  PT FREQUENCY: 3x/week  PT DURATION:  4 weeks  PLANNED INTERVENTIONS: 97164- PT Re-evaluation, 97110-Therapeutic exercises, 97530- Therapeutic activity, 97112- Neuromuscular re-education, 97535- Self Care, and 02859- Manual therapy  PLAN FOR NEXT SESSION: Review standing dowel exs, progress to strength, practice MLD with pt just having her touch each step of sequence to finalize her understanding, Continue complete decongestive therapy; see Media section for protocol though physician has released her, next appt with surgeon 05/21/2022. Pt needs new script for flat knit but should wait to be measured until fibrosis further decreases.   Berwyn Knights, PTA 05/02/23 12:51 PM   Cancer Rehab [phone]  Start with circles  at neck on each side placing hands behind collarbones and circling back and in towards neck.  Deep Effective Breath   Standing, sitting, or laying down, place both hands on the belly. Take a deep breath IN, expanding the belly; then breath OUT, contracting the belly. Repeat __5__ times. Do __2-3__ sessions per day and before your self massage.   Axilla to Axilla - Sweep   On uninvolved side make 5 circles in the armpit, then pump _5__ times from involved armpit across chest to uninvolved armpit, making a pathway. Do _1__ time per day.  Copyright  VHI. All rights reserved.  Axilla to Inguinal Nodes -  Sweep   On involved side, make 5 circles at groin at panty line, then pump _5__ times from armpit along side of trunk to outer hip, making your other pathway. Do __1_ time per day.   Arm Posterior: Elbow to Shoulder - Sweep   Pump _5__ times from back of elbow to top of shoulder. Then inner to outer upper arm _5_ times, then outer arm again _5_ times. Then back to the pathways _2-3_ times. Do _1__ time per day.  ARM: Volar Wrist to Elbow - Sweep   Pump or stationary circles _5__ times from wrist to elbow making sure to do both sides of the forearm. Then retrace your steps to the outer arm, and the pathways _2-3_ times each. Do _1__ time per day.   ARM: Dorsum of Hand to Shoulder - Sweep   Pump or stationary circles _5__ times on back of hand including knuckle spaces and individual fingers if needed working up towards the wrist, then retrace all your steps working back up the forearm, doing both sides; upper outer arm and back to your pathways _2-3_ times each. Then do 5 circles again at uninvolved armpit and involved groin where you started! Good job!! Do __1_ time per day.   Flexion (Eccentric) - Active-Assist (Cane)              Use unaffected arm to push affected arm forward. Avoid hiking shoulder (shoulder should NOT touch cheek). Keep palm relaxed. Slowly lower affected arm. Hold stretch for _5_ seconds repeating _5-10_ times, _1-2_ times a day.  Abduction (Eccentric) - Active-Assist (Cane)    Use unaffected arm to push affected arm out to side. Avoid hiking shoulder (shoulder should NOT touch cheek). Keep palm relaxed. Slowly lower affected arm. Hold stretch _5_ seconds repeating _5-10_ times, _1-2_ times a day.  Cane Exercise: Extension   Stand holding cane behind back with both hands palm-up. Lift the cane away from body until gentle stretch felt. Do NOT lean forward.  Hold __5__ seconds. Repeat _5-10___ times. Do _1-2___ sessions per day.

## 2023-05-04 ENCOUNTER — Ambulatory Visit: Payer: Medicare PPO

## 2023-05-04 DIAGNOSIS — Z4789 Encounter for other orthopedic aftercare: Secondary | ICD-10-CM | POA: Diagnosis not present

## 2023-05-04 DIAGNOSIS — M25612 Stiffness of left shoulder, not elsewhere classified: Secondary | ICD-10-CM | POA: Diagnosis not present

## 2023-05-04 DIAGNOSIS — Z96612 Presence of left artificial shoulder joint: Secondary | ICD-10-CM | POA: Diagnosis not present

## 2023-05-04 DIAGNOSIS — I972 Postmastectomy lymphedema syndrome: Secondary | ICD-10-CM | POA: Diagnosis not present

## 2023-05-04 NOTE — Therapy (Signed)
 OUTPATIENT PT UPPER EXTREMITY LYMPHEDEMA TREATMENT  Patient Name: Sarah Underwood MRN: [id number] DOB:07/01/1945, 78 y.o., female Today's Date: 05/04/2023  END OF SESSION:  PT End of Session - 05/04/23 1108     Visit Number 15    Number of Visits 25    Date for PT Re-Evaluation 05/28/23    Authorization Type [employer] Auth needed    Authorization Time Period 12 visits approved 05/03/2023-06/01/2023    Authorization - Visit Number 1    Authorization - Number of Visits 12    PT Start Time 1104    PT Stop Time 1200    PT Time Calculation (min) 56 min    Activity Tolerance Patient tolerated treatment well    Behavior During Therapy WFL for tasks assessed/performed                 Past Medical History:  Diagnosis Date   Basal cell carcinoma 1994   right side of node   Benign positional vertigo    with rolling over    Celiac disease    Dysrhythmia    Generalized anxiety disorder    GERD (gastroesophageal reflux disease)    Graves disease 2007   thyroid  nodule   Hard of hearing    Heart murmur    History of breast cancer 1995   left - treated with mastectomy   History of radiation therapy 2001   37 sessions following recurrence of breast cancer in left chest wall   Hypertension    Knee pain, bilateral 12/14/2014   Major depressive disorder in remission    Mild persistent asthma, uncomplicated 06/29/2015   Nocturia    OA (osteoarthritis) of hip 09/08/2015   Ovarian mass    small solid nodule 8x13mm: Right ovary - stable since 2000, follow u/s do not show this.   Renal mass 2021   followed by Dr. Alvaro, [facility].  Having every 6 month follow up   Seasonal allergies    Stress fracture of metatarsal bone of left foot 12/15/2019   Vegetarian diet    Past Surgical History:  Procedure Laterality Date   APPENDECTOMY     basal cell removal  7/94   right nose   BREAST BIOPSY Right 1987   benign fibroadenoma   breast cancer recurrence Left 01/2000   at site of left chest  wall after previous mastectomy with removal and treatment with radiaton.   CARPAL TUNNEL RELEASE Right 05/08/2013   Procedure: RIGHT CARPAL TUNNEL RELEASE;  Surgeon: Lamar LULLA Leonor Mickey., MD;  Location: [facility];  Service: Orthopedics;  Laterality: Right;   CATARACT EXTRACTION     03/20/14 left eye, 12/15 right eye   COLPOSCOPY W/ BIOPSY / CURETTAGE  03/12/12   benign, but + HR HPV   EYE SURGERY     bilat cataract surgery    MASTECTOMY Left 3/95   Stage IIb 0/14 LN ER/ PR + with mastectomy - Dr. Morna   MOHS SURGERY     right nare   OVARY SURGERY  1974   lt   REVERSE SHOULDER ARTHROPLASTY Left 02/26/2023   Procedure: REVERSE SHOULDER ARTHROPLASTY;  Surgeon: Sharl Selinda Dover, MD;  Location: [facility];  Service: Orthopedics;  Laterality: Left;   TONSILLECTOMY AND ADENOIDECTOMY  1950   TOTAL HIP ARTHROPLASTY Right 09/08/2015   Procedure: TOTAL HIP ARTHROPLASTY ANTERIOR APPROACH;  Surgeon: Dempsey Moan, MD;  Location: [facility];  Service: Orthopedics;  Laterality: Right;   Patient Active Problem List  Diagnosis Date Noted   Shoulder fracture 02/23/2023   Closed fracture of proximal end of left humerus, unspecified fracture morphology, initial encounter 02/22/2023   Thyroid  nodule 02/22/2023   Major depressive disorder in remission 03/29/2020   Generalized anxiety disorder    Benign positional vertigo    Hypertension    Heart murmur    Stress fracture of metatarsal bone of left foot 12/15/2019   History of revision of total replacement of right hip joint 11/26/2017   Leg edema, left 09/26/2016   Post-nasal drainage 03/30/2016   OA (osteoarthritis) of hip 09/08/2015   Mild persistent asthma, uncomplicated 06/29/2015   Hip pain, bilateral 12/14/2014   Knee pain, bilateral 12/14/2014   Postmenopausal atrophic vaginitis 02/23/2013   Graves disease 2007   History of radiation therapy 2001   History of breast cancer 1995   Basal cell carcinoma 1994    PCP: Glendia Freeman, MD  REFERRING PROVIDER: Selinda Gosling, MD  REFERRING DIAG:  Diagnosis  C50.511,Z17.0 (ICD-10-CM) - Malignant neoplasm of lower-outer quadrant of right breast of female, estrogen receptor positive (HCC)  I89.0 (ICD-10-CM) - Lymphedema  Z47.89 orthopedic aftercare  S/P reverse total shoulder arthroplasty, left  Encounter for other orthopedic aftercare  Stiffness of left shoulder, not elsewhere classified  THERAPY DIAG:  Postmastectomy lymphedema  S/P reverse total shoulder arthroplasty, left  Encounter for other orthopedic aftercare  Stiffness of left shoulder, not elsewhere classified  ONSET DATE: 02/26/23  Rationale for Evaluation and Treatment: Rehabilitation  SUBJECTIVE                                                                                                                                                                                           SUBJECTIVE STATEMENT:  I drove myself for the first time today! I did pretty well. I have a massage therapist friend who has been coming to my house and teaching me how to work on the trigger points in my Lt UT which has freed up my cervical ROM for driving. I am very happy to be getting my independence back. I feel like I'm doing well with stretches at home now. Can we just focus on the MLD today?    PERTINENT HISTORY: Recent fall with left arm fracture.  Surgery with Reverse TSR 02/26/23.  This is the same side as her mastectomy which was diagnosed in 1995 with chest wall recurrence in 2001.  No remaining lymph nodes.   Shoulder surgery: Dr. Selinda Gosling -See media section for post-op approved exercises and okay for compression therapy.    PAIN:  Are you having pain?  PAIN:  Are you having pain?  Yesif I move in certain directions NPRS scale: 0/10 at best but reaches 3-4/10 with certain motions of my shoulder Pain location: left shoulder Pain orientation: Left  PAIN TYPE: aching Pain description: intermittent   Aggravating factors: reaching in certain directions, bed mobility, Relieving factors: rest, relaxing muscles, stretches    PRECAUTIONS: Can start AROM/AAROM at 03/27/23 and PROM at 2 months (04/23/23), use sling at night until 03/27/23; Can begin passive forward flexion to 90 degrees, scapular retractions, table slides, limiteD ER to neurtral until 4 weeks post op. Starting week 4 post op she can begin all active and AAROM as tolerated and consider adding foam to bandage at that time for fibrosis at forearm. No PROM until 2 months post op. She can discontinue the sling during the day, use the sling at night for 2 more weeks.   RED FLAGS: None   WEIGHT BEARING RESTRICTIONS: No  FALLS:  Has patient fallen in last 6 months? Yes. Number of falls 1 - does not remember details of her fall. Just remembers she was going outside.   LIVING ENVIRONMENT: Lives with: lives alone Has a CNA for assistance at home.   OCCUPATION: Retired  LEISURE: Not currently exercising  HAND DOMINANCE : Right  PRIOR LEVEL OF FUNCTION: Independent  PATIENT GOALS: prevent chronic lymphedema after this fall   OBJECTIVE (FROM EVAL) Note: Objective measures were completed at Evaluation unless otherwise noted.  COGNITION:  Overall cognitive status: Within functional limits for tasks assessed   PALPATION: +2 pitting edema back of hand, forearm, below, to around the distal 1/3rd of the upper arm.   OBSERVATIONS / OTHER ASSESSMENTS: bruising all fingers and hand, elbow, and upper arm from surgery.  Skin tight and shiny from edema.     POSTURE: wearing sling upon arrival and leaving  UPPER EXTREMITY AROM/PROM: Just starting wrist and elbow AROM - did not measure objectively.  No shoulder ROM allowed.   04/04/2023 Left UE AAROM elevation in supine=   LEFT UE AROM elevation in supine: 100  04/11/2023   LEFT ER AROM supine:17   LEFT IR AROM supine:68 30 deg abd      IN SUPINE 04/30/2023 Left shoulder flex  AROM in supine;  117 with elbow flexed to initiate   IR AROM  to stomach    ER  AROM 30 degrees at 30 degrees abduction     LYMPHEDEMA ASSESSMENTS:  LANDMARK RIGHT eval  At axilla 30.4  15cm proximal to olecranon 29.4  10 cm proximal to olecranon process 28.3  Olecranon process 24.7  15cm proximal to ulnar styloid 22.1  10 cm proximal to ulnar styloid process 18.7  Just proximal to ulnar styloid process 16.2  Across hand at thumb web space 19.8  At base of 2nd digit 6.4  (Blank rows = not tested)  LANDMARK LEFT  eval LEFT 03/26/2023 LEFT 04/11/2023 LEFT  16/2025  At axilla 33 31 29.6 29.7  15cm proximal to olecranon 32.5 29.4 28.8 28.4  10 cm proximal to olecranon process 34.5 30.2 28.0 27.6  Olecranon process 30.5 26.6 24.0 23.4  15cm proximal to ulnar styloid  28.7 25.6 23.0 23.0  10 cm proximal to ulnar styloid process 26.5 23.4 20.5 21.1  Just proximal to ulnar styloid process 18.5 17.2 15.9 15.8  Across hand at thumb web space 20.4 18.8 19.2 19.0  At base of 2nd digit 7.0 6.1 5.85 6.0  (Blank rows = not tested)  Volume (at eval):  Length 7cm Rt:                         Lt:                      04/30/2023  1596 ml difference   PATIENT SURVEYS:  Quick Dash 68% limited EVAL 04/29/2022     34%   TODAY'S TREATMENT:                                                                                                                                         DATE:  05/04/23: Manual Therapy MLD to Lt UE reviewing with pt per handout issued at last session: Short neck, superficial and deep abdominals (reviewed with pt 5 diaphragmatic breaths here for home), Rt axillary nodes, anterior inter-axillary anasotmosis, Lt inguinal nodes, Lt axillo-inguinal anastomosis, Lt UE working from proximal to distal then retracing all steps reviewing all with pt throughout P/ROM of Lt shoulder to pts tolerance into flex, abd and er/IR STM gently to ant and sup shoulder  during P/ROM and along incision Assisted pt with donning her velcro compression garment at end of session. Also issued her an extra oversleeve we had to help her velcro stay as the ends of the velcro are starting to unfurl during day. Pt plans to call compression company about this.   05/02/23: Self Care Spent time reviewing the lymphatic system and that her accumulation at medial upper arm is probable due to now damaged lymphatic collectors from injury and then surgery. Answered pts questions regarding this. Then reviewed the MLD sequence by having pt return demo while therapist stood in front of pt having her return each step and explaining the sequence while performing. Instructed her that she can spend more time in area of fibrosis at medial upper arm. Pt was able to return good demo. Also offered pressure checks so pt would realize the lightness of the pressure and skin stretch, not slide. Issued handout as well.  Therapeutic Exercises Dowel exercises in standing in front of mirror into flex (done in both ways holid dowle horz and then vertically) x 5-7 reps each, then Lt UE abd x 5 reps returning therapist demo and VC's to work on decreasing Lt scapular compensation as much as able (this will be some limited though always due to nature of her surgery) Pulleys into flex and abd x 2 mins each reviewing proper technique and to hold stretch at end of each rep. Pt reports feeling excellent stretch with these so instructed her how to order these on [employer] for continued home use.   04/30/2023 Measured arm circumference and AROM of left shoulder for re;eval Reviewed goals with pt and pt progress. Reviewed isometrics briefly with pt at her request showing her proper way of standing close to wall and adjusting pressure according to  how her shoulder feels Manual lymph drainage to left UE in supine: short neck, left inguinal nodes, left axillo-inguinal anastomosis, Rt axillary nodes, Rt anterior intact thorax  sequence and anterior inter-axillary anastomosis, then left UE working proximal to distal then retracing all steps from fingers to axilla redirecting along pathways. PT applied Farrow wrap with assistance of pt to pull up      PATIENT EDUCATION:  Access Code: [email or web address] URL: [email or web address] Date: 04/13/2023 Prepared by: Grayce Sheldon  Exercises - Standing Isometric Shoulder Internal Rotation at Doorway  - 1 x daily - 7 x weekly - 3 sets - 10 reps - Standing Isometric Shoulder External Rotation with Doorway  - 1 x daily - 7 x weekly - 1 sets - 5-10 reps - Isometric Shoulder Flexion at Wall  - 1 x daily - 7 x weekly - 1 sets - 5-10 reps - 5 hold - Isometric Shoulder Extension at Wall  - 1 x daily - 7 x weekly - 1 sets - 5-10 reps - Isometric Shoulder Abduction at Wall  - 1 x daily - 7 x weekly - 1 sets - 5-10 reps   05/02/23: Education details: Self MLD and standing dowel exercises Person educated: Patient Education method: Explanation, Demonstration, and Verbal cues, and tactile cues, handout issued Education comprehension: verbalized understanding, returned demonstration, tactile and VC's and pt will benefit from further review  HOME EXERCISE PROGRAM: Isometrics for Lt shoulder Standing dowel exercises Self MLD  ASSESSMENT:  CLINICAL IMPRESSION: Pt reports feeling independent with current HEP and would like to focus on MLD as she did this at home yesterday and wants further review. So performed MLD reviewing with pt while performing and incorporated P/ROM to Lt shoulder as well. Pt is very pleased with her current progress thus far and drove for first time today as she feels her Lt shoulder A/ROM has gotten to a point that she can do this safely for short distances.  OBJECTIVE IMPAIRMENTS: decreased activity tolerance, decreased knowledge of condition, decreased knowledge of use of DME, decreased mobility, decreased ROM, and increased edema.   ACTIVITY  LIMITATIONS: carrying, lifting, sleeping, bathing, dressing, and reach over head  PARTICIPATION LIMITATIONS: meal prep, cleaning, laundry, shopping, and community activity  PERSONAL FACTORS: Age and 1 comorbidity: ALND and radiation hx  are also affecting patient's functional outcome.   REHAB POTENTIAL: Excellent  CLINICAL DECISION MAKING: Evolving/moderate complexity  EVALUATION COMPLEXITY: Moderate  GOALS: Goals reviewed with patient? Yes  SHORT TERM GOALS: Target date: 04/08/23    Pt will be ind with self MLD for edema reduction Baseline: Goal status: In progress 2.  Pt will decrease volume to or less to demonstrate improving edema status Baseline:  Goal status: MET 1596  (8 ml more) 04/30/2023 3.  Pt will be evaluated and treated for shoulder with goals added as needed as instructed by MD during future visits.  Baseline:  Goal status:MET 04/04/2023  LONG TERM GOALS: Target date: 05/10/23  Pt will reduce to less than volume difference between sides to demonstrate no active lymphedema Baseline:  Goal status: MET 04/29/2022 (1596 ml) 2.  Pt will obtain compression as needed for discharge depending on reduction Baseline:  Goal status: MET 04/11/2023   3. Pt will be able to perform AROM  elevation in supine to atleast 120 degrees without compensation  Goal Status; In progress, 117 04/30/2023  4. Pt will be able to place 8 oz object on eye level shelf without UT compensation  GOALStatus: In progress  5. Pt will be tolerant of resisted exs for improving UE strength at 7 weeks post op Goal Status; In progress ;started isometrics 6. Pt will be able to return to driving for greater independence Goal status: new   PLAN:  PT FREQUENCY: 3x/week  PT DURATION:  4 weeks  PLANNED INTERVENTIONS: 97164- PT Re-evaluation, 97110-Therapeutic exercises, 97530- Therapeutic activity, 97112- Neuromuscular re-education, 97535- Self Care, and 02859- Manual therapy  PLAN FOR NEXT  SESSION: Review standing dowel exs prn, progress to strength, review and cont MLD prn; see Media section for protocol though physician has released her, next appt with surgeon 05/21/2022. Pt needs new script for flat knit but should wait to be measured until fibrosis further decreases.   Berwyn Knights, PTA 05/04/23 12:39 PM   Can[facility] [phone]  Start with circles at neck on each side placing hands behind collarbones and circling back and in towards neck.  Deep Effective Breath   Standing, sitting, or laying down, place both hands on the belly. Take a deep breath IN, expanding the belly; then breath OUT, contracting the belly. Repeat __5__ times. Do __2-3__ sessions per day and before your self massage.   Axilla to Axilla - Sweep   On uninvolved side make 5 circles in the armpit, then pump _5__ times from involved armpit across chest to uninvolved armpit, making a pathway. Do _1__ time per day.  Copyright  [employer]. All rights reserved.  Axilla to Inguinal Nodes - Sweep   On involved side, make 5 circles at groin at panty line, then pump _5__ times from armpit along side of trunk to outer hip, making your other pathway. Do __1_ time per day.   Arm Posterior: Elbow to Shoulder - Sweep   Pump _5__ times from back of elbow to top of shoulder. Then inner to outer upper arm _5_ times, then outer arm again _5_ times. Then back to the pathways _2-3_ times. Do _1__ time per day.  ARM: Volar Wrist to Elbow - Sweep   Pump or stationary circles _5__ times from wrist to elbow making sure to do both sides of the forearm. Then retrace your steps to the outer arm, and the pathways _2-3_ times each. Do _1__ time per day.   ARM: Dorsum of Hand to Shoulder - Sweep   Pump or stationary circles _5__ times on back of hand including knuckle spaces and individual fingers if needed working up towards the wrist, then retrace all your steps working back up the forearm, doing both sides; upper  outer arm and back to your pathways _2-3_ times each. Then do 5 circles again at uninvolved armpit and involved groin where you started! Good job!! Do __1_ time per day.   Flexion (Eccentric) - Active-Assist (Cane)              Use unaffected arm to push affected arm forward. Avoid hiking shoulder (shoulder should NOT touch cheek). Keep palm relaxed. Slowly lower affected arm. Hold stretch for _5_ seconds repeating _5-10_ times, _1-2_ times a day.  Abduction (Eccentric) - Active-Assist (Cane)    Use unaffected arm to push affected arm out to side. Avoid hiking shoulder (shoulder should NOT touch cheek). Keep palm relaxed. Slowly lower affected arm. Hold stretch _5_ seconds repeating _5-10_ times, _1-2_ times a day.  Cane Exercise: Extension   Stand holding cane behind back with both hands palm-up. Lift the cane away from body until gentle stretch felt. Do NOT lean forward.  Hold __5__ seconds. Repeat _5-10___ times.  Do _1-2___ sessions per day.

## 2023-05-07 ENCOUNTER — Encounter: Payer: Self-pay | Admitting: Rehabilitation

## 2023-05-07 ENCOUNTER — Ambulatory Visit: Payer: Medicare PPO | Admitting: Rehabilitation

## 2023-05-07 DIAGNOSIS — Z96612 Presence of left artificial shoulder joint: Secondary | ICD-10-CM | POA: Diagnosis not present

## 2023-05-07 DIAGNOSIS — I972 Postmastectomy lymphedema syndrome: Secondary | ICD-10-CM

## 2023-05-07 DIAGNOSIS — Z4789 Encounter for other orthopedic aftercare: Secondary | ICD-10-CM

## 2023-05-07 DIAGNOSIS — M25612 Stiffness of left shoulder, not elsewhere classified: Secondary | ICD-10-CM | POA: Diagnosis not present

## 2023-05-07 NOTE — Therapy (Signed)
 OUTPATIENT PT UPPER EXTREMITY LYMPHEDEMA TREATMENT  Patient Name: Sarah Underwood MRN: [id number] DOB:[birth year], 78 y.o., female Today's Date: 05/07/2023  END OF SESSION:  PT End of Session - 05/07/23 1256     Visit Number 16    Number of Visits 25    Date for PT Re-Evaluation 05/28/23    Authorization Time Period 12 visits approved 05/03/2023-06/01/2023    Authorization - Visit Number 2    Authorization - Number of Visits 12    PT Start Time 1300    Activity Tolerance Patient tolerated treatment well    Behavior During Therapy [facility] for tasks assessed/performed                 Past Medical History:  Diagnosis Date   Basal cell carcinoma 1994   right side of node   Benign positional vertigo    with rolling over    Celiac disease    Dysrhythmia    Generalized anxiety disorder    GERD (gastroesophageal reflux disease)    Graves disease 2007   thyroid  nodule   Hard of hearing    Heart murmur    History of breast cancer 1995   left - treated with mastectomy   History of radiation therapy 2001   37 sessions following recurrence of breast cancer in left chest wall   Hypertension    Knee pain, bilateral 12/14/2014   Major depressive disorder in remission    Mild persistent asthma, uncomplicated 06/29/2015   Nocturia    OA (osteoarthritis) of hip 09/08/2015   Ovarian mass    small solid nodule 8x14mm: Right ovary - stable since 2000, follow u/s do not show this.   Renal mass 2021   followed by Dr. Alvaro, [facility].  Having every 6 month follow up   Seasonal allergies    Stress fracture of metatarsal bone of left foot 12/15/2019   Vegetarian diet    Past Surgical History:  Procedure Laterality Date   APPENDECTOMY     basal cell removal  7/94   right nose   BREAST BIOPSY Right 1987   benign fibroadenoma   breast cancer recurrence Left 01/2000   at site of left chest wall after previous mastectomy with removal and treatment with radiaton.   CARPAL TUNNEL RELEASE  Right 05/08/2013   Procedure: RIGHT CARPAL TUNNEL RELEASE;  Surgeon: Lamar LULLA Leonor Mickey., MD;  Location: [facility];  Service: Orthopedics;  Laterality: Right;   CATARACT EXTRACTION     03/20/14 left eye, 12/15 right eye   COLPOSCOPY W/ BIOPSY / CURETTAGE  03/12/12   benign, but + HR HPV   EYE SURGERY     bilat cataract surgery    MASTECTOMY Left 3/95   Stage IIb 0/14 LN ER/ PR + with mastectomy - Dr. Morna   MOHS SURGERY     right nare   OVARY SURGERY  1974   lt   REVERSE SHOULDER ARTHROPLASTY Left 02/26/2023   Procedure: REVERSE SHOULDER ARTHROPLASTY;  Surgeon: Sharl Selinda Dover, MD;  Location: [facility] OR;  Service: Orthopedics;  Laterality: Left;   TONSILLECTOMY AND ADENOIDECTOMY  1950   TOTAL HIP ARTHROPLASTY Right 09/08/2015   Procedure: TOTAL HIP ARTHROPLASTY ANTERIOR APPROACH;  Surgeon: Dempsey Moan, MD;  Location: [facility];  Service: Orthopedics;  Laterality: Right;   Patient Active Problem List   Diagnosis Date Noted   Shoulder fracture 02/23/2023   Closed fracture of proximal end of left humerus, unspecified fracture morphology, initial encounter 02/22/2023  Thyroid  nodule 02/22/2023   Major depressive disorder in remission 03/29/2020   Generalized anxiety disorder    Benign positional vertigo    Hypertension    Heart murmur    Stress fracture of metatarsal bone of left foot 12/15/2019   History of revision of total replacement of right hip joint 11/26/2017   Leg edema, left 09/26/2016   Post-nasal drainage 03/30/2016   OA (osteoarthritis) of hip 09/08/2015   Mild persistent asthma, uncomplicated 06/29/2015   Hip pain, bilateral 12/14/2014   Knee pain, bilateral 12/14/2014   Postmenopausal atrophic vaginitis 02/23/2013   Graves disease 2007   History of radiation therapy 2001   History of breast cancer 1995   Basal cell carcinoma 1994    PCP: Glendia Freeman, MD  REFERRING PROVIDER: Selinda Gosling, MD  REFERRING DIAG:  Diagnosis  C50.511,Z17.0  (ICD-10-CM) - Malignant neoplasm of lower-outer quadrant of right breast of female, estrogen receptor positive (HCC)  I89.0 (ICD-10-CM) - Lymphedema  Z47.89 orthopedic aftercare  S/P reverse total shoulder arthroplasty, left  Encounter for other orthopedic aftercare  Stiffness of left shoulder, not elsewhere classified  THERAPY DIAG:  Postmastectomy lymphedema  S/P reverse total shoulder arthroplasty, left  Encounter for other orthopedic aftercare  Stiffness of left shoulder, not elsewhere classified  ONSET DATE: 02/26/23  Rationale for Evaluation and Treatment: Rehabilitation  SUBJECTIVE                                                                                                                                                                                           SUBJECTIVE STATEMENT:  I am really doing well   PERTINENT HISTORY: Recent fall with left arm fracture.  Surgery with Reverse TSR 02/26/23.  This is the same side as her mastectomy which was diagnosed in 1995 with chest wall recurrence in 2001.  No remaining lymph nodes.   Shoulder surgery: Dr. Selinda Gosling -See media section for post-op approved exercises and okay for compression therapy.    PAIN:  Are you having pain?  PAIN:  Are you having pain? Yesif I move in certain directions NPRS scale: 0/10 at best but reaches 3-4/10 with certain motions of my shoulder Pain location: left shoulder Pain orientation: Left  PAIN TYPE: aching Pain description: intermittent  Aggravating factors: reaching in certain directions, bed mobility, Relieving factors: rest, relaxing muscles, stretches   PRECAUTIONS: cleared for all ROM/activity of the shoulder per MD  RED FLAGS: None   WEIGHT BEARING RESTRICTIONS: No  FALLS:  Has patient fallen in last 6 months? Yes. Number of falls 1 - does not remember details of her fall. Just remembers she was going  outside.   LIVING ENVIRONMENT: Lives with: lives alone Has a CNA  for assistance at home.   OCCUPATION: Retired  LEISURE: Not currently exercising  HAND DOMINANCE : Right  PRIOR LEVEL OF FUNCTION: Independent  PATIENT GOALS: prevent chronic lymphedema after this fall   OBJECTIVE (FROM EVAL) Note: Objective measures were completed at Evaluation unless otherwise noted.  COGNITION:  Overall cognitive status: Within functional limits for tasks assessed   PALPATION: +2 pitting edema back of hand, forearm, below, to around the distal 1/3rd of the upper arm.   OBSERVATIONS / OTHER ASSESSMENTS: bruising all fingers and hand, elbow, and upper arm from surgery.  Skin tight and shiny from edema.     POSTURE: wearing sling upon arrival and leaving  UPPER EXTREMITY AROM/PROM: Just starting wrist and elbow AROM - did not measure objectively.  No shoulder ROM allowed.   04/04/2023 Left UE AAROM elevation in supine=   LEFT UE AROM elevation in supine: 100  04/11/2023   LEFT ER AROM supine:17   LEFT IR AROM supine:68 30 deg abd      IN SUPINE 04/30/2023 Left shoulder flex AROM in supine;  117 with elbow flexed to initiate   IR AROM  to stomach    ER  AROM 30 degrees at 30 degrees abduction     LYMPHEDEMA ASSESSMENTS:  LANDMARK RIGHT eval  At axilla 30.4  15cm proximal to olecranon 29.4  10 cm proximal to olecranon process 28.3  Olecranon process 24.7  15cm proximal to ulnar styloid 22.1  10 cm proximal to ulnar styloid process 18.7  Just proximal to ulnar styloid process 16.2  Across hand at thumb web space 19.8  At base of 2nd digit 6.4  (Blank rows = not tested)  LANDMARK LEFT  eval LEFT 03/26/2023 LEFT 04/11/2023 LEFT  16/2025  At axilla 33 31 29.6 29.7  15cm proximal to olecranon 32.5 29.4 28.8 28.4  10 cm proximal to olecranon process 34.5 30.2 28.0 27.6  Olecranon process 30.5 26.6 24.0 23.4  15cm proximal to ulnar styloid  28.7 25.6 23.0 23.0  10 cm proximal to ulnar styloid process 26.5 23.4 20.5 21.1  Just proximal to ulnar  styloid process 18.5 17.2 15.9 15.8  Across hand at thumb web space 20.4 18.8 19.2 19.0  At base of 2nd digit 7.0 6.1 5.85 6.0  (Blank rows = not tested)  Volume (at eval):          Length 7cm Rt:                         Lt:                      04/30/2023  1596 ml difference   PATIENT SURVEYS:  Quick Dash 68% limited EVAL 04/29/2022     34%   TODAY'S TREATMENT:  DATE:   05/07/23 Reviewed garments status about failing velcro and emailed Melissa at [facility] for direction on this x Therapeutic Exercise Discussed standing dowel motion to mimic reach for top of steering wheel Supine flexion AAROM with 1 finger only x 3 and then small circles at 90deg x 5 c/cc no assistance needed Sidelying AROM into abduction x 3 and flexion x 3 as tolerated Manual Therapy MLD to Lt UE superficial and deep abdominals (reviewed with pt 5 diaphragmatic breaths here for home), Rt axillary nodes, anterior inter-axillary anasotmosis, Lt inguinal nodes, Lt axillo-inguinal anastomosis, Lt UE working from proximal to distal then retracing all steps reviewing all with pt throughout P/ROM of Lt shoulder to pts tolerance into flex, abd and er/IR  05/04/23: Manual Therapy MLD to Lt UE reviewing with pt per handout issued at last session: Short neck, superficial and deep abdominals (reviewed with pt 5 diaphragmatic breaths here for home), Rt axillary nodes, anterior inter-axillary anasotmosis, Lt inguinal nodes, Lt axillo-inguinal anastomosis, Lt UE working from proximal to distal then retracing all steps reviewing all with pt throughout P/ROM of Lt shoulder to pts tolerance into flex, abd and er/IR STM gently to ant and sup shoulder during P/ROM and along incision Assisted pt with donning her velcro compression garment at end of session. Also issued her an extra  oversleeve we had to help her velcro stay as the ends of the velcro are starting to unfurl during day. Pt plans to call compression company about this.   05/02/23: Self Care Spent time reviewing the lymphatic system and that her accumulation at medial upper arm is probable due to now damaged lymphatic collectors from injury and then surgery. Answered pts questions regarding this. Then reviewed the MLD sequence by having pt return demo while therapist stood in front of pt having her return each step and explaining the sequence while performing. Instructed her that she can spend more time in area of fibrosis at medial upper arm. Pt was able to return good demo. Also offered pressure checks so pt would realize the lightness of the pressure and skin stretch, not slide. Issued handout as well.  Therapeutic Exercises Dowel exercises in standing in front of mirror into flex (done in both ways holid dowle horz and then vertically) x 5-7 reps each, then Lt UE abd x 5 reps returning therapist demo and VC's to work on decreasing Lt scapular compensation as much as able (this will be some limited though always due to nature of her surgery) Pulleys into flex and abd x 2 mins each reviewing proper technique and to hold stretch at end of each rep. Pt reports feeling excellent stretch with these so instructed her how to order these on [employer] for continued home use.   04/30/2023 Measured arm circumference and AROM of left shoulder for re;eval Reviewed goals with pt and pt progress. Reviewed isometrics briefly with pt at her request showing her proper way of standing close to wall and adjusting pressure according to how her shoulder feels Manual lymph drainage to left UE in supine: short neck, left inguinal nodes, left axillo-inguinal anastomosis, Rt axillary nodes, Rt anterior intact thorax sequence and anterior inter-axillary anastomosis, then left UE working proximal to distal then retracing all steps from fingers to  axilla redirecting along pathways. PT applied Farrow wrap with assistance of pt to pull up      PATIENT EDUCATION:  Access Code: [email or web address] URL: [email or web address] Date: 04/13/2023 Prepared by: Grayce Sheldon  Exercises - Standing Isometric Shoulder  Internal Rotation at Doorway  - 1 x daily - 7 x weekly - 3 sets - 10 reps - Standing Isometric Shoulder External Rotation with Doorway  - 1 x daily - 7 x weekly - 1 sets - 5-10 reps - Isometric Shoulder Flexion at Wall  - 1 x daily - 7 x weekly - 1 sets - 5-10 reps - 5 hold - Isometric Shoulder Extension at Wall  - 1 x daily - 7 x weekly - 1 sets - 5-10 reps - Isometric Shoulder Abduction at Wall  - 1 x daily - 7 x weekly - 1 sets - 5-10 reps   05/02/23: Education details: Self MLD and standing dowel exercises Person educated: Patient Education method: Explanation, Demonstration, and Verbal cues, and tactile cues, handout issued Education comprehension: verbalized understanding, returned demonstration, tactile and VC's and pt will benefit from further review  HOME EXERCISE PROGRAM: Isometrics for Lt shoulder Standing dowel exercises Self MLD  ASSESSMENT:  CLINICAL IMPRESSION: Pt is doing well.  Will continue with POC focusing on regaining strength. Will monitor what Jobst says about garment.   OBJECTIVE IMPAIRMENTS: decreased activity tolerance, decreased knowledge of condition, decreased knowledge of use of DME, decreased mobility, decreased ROM, and increased edema.   ACTIVITY LIMITATIONS: carrying, lifting, sleeping, bathing, dressing, and reach over head  PARTICIPATION LIMITATIONS: meal prep, cleaning, laundry, shopping, and community activity  PERSONAL FACTORS: Age and 1 comorbidity: ALND and radiation hx  are also affecting patient's functional outcome.   REHAB POTENTIAL: Excellent  CLINICAL DECISION MAKING: Evolving/moderate complexity  EVALUATION COMPLEXITY: Moderate  GOALS: Goals reviewed with  patient? Yes  SHORT TERM GOALS: Target date: 04/08/23    Pt will be ind with self MLD for edema reduction Baseline: Goal status: In progress 2.  Pt will decrease volume to or less to demonstrate improving edema status Baseline:  Goal status: MET 1596  (8 ml more) 04/30/2023 3.  Pt will be evaluated and treated for shoulder with goals added as needed as instructed by MD during future visits.  Baseline:  Goal status:MET 04/04/2023  LONG TERM GOALS: Target date: 05/10/23  Pt will reduce to less than volume difference between sides to demonstrate no active lymphedema Baseline:  Goal status: MET 04/29/2022 (1596 ml) 2.  Pt will obtain compression as needed for discharge depending on reduction Baseline:  Goal status: MET 04/11/2023   3. Pt will be able to perform AROM  elevation in supine to atleast 120 degrees without compensation  Goal Status; In progress, 117 04/30/2023  4. Pt will be able to place 8 oz object on eye level shelf without UT compensation  GOALStatus: In progress  5. Pt will be tolerant of resisted exs for improving UE strength at 7 weeks post op Goal Status; In progress ;started isometrics 6. Pt will be able to return to driving for greater independence Goal status: new   PLAN:  PT FREQUENCY: 3x/week  PT DURATION:  4 weeks  PLANNED INTERVENTIONS: 97164- PT Re-evaluation, 97110-Therapeutic exercises, 97530- Therapeutic activity, 97112- Neuromuscular re-education, 97535- Self Care, and [id number]- Manual therapy  PLAN FOR NEXT SESSION: Review standing dowel exs prn, progress to strength, review and cont MLD prn; see Media section for protocol though physician has released her, next appt with surgeon 05/21/2022. Pt needs new script for flat knit but should wait to be measured until fibrosis further decreases.   Berwyn Knights, PTA 05/07/23 12:56 PM   Cancer Rehab [phone]  Start with circles at neck on each  side placing hands behind collarbones and  circling back and in towards neck.  Deep Effective Breath   Standing, sitting, or laying down, place both hands on the belly. Take a deep breath IN, expanding the belly; then breath OUT, contracting the belly. Repeat __5__ times. Do __2-3__ sessions per day and before your self massage.   Axilla to Axilla - Sweep   On uninvolved side make 5 circles in the armpit, then pump _5__ times from involved armpit across chest to uninvolved armpit, making a pathway. Do _1__ time per day.  Copyright  VHI. All rights reserved.  Axilla to Inguinal Nodes - Sweep   On involved side, make 5 circles at groin at panty line, then pump _5__ times from armpit along side of trunk to outer hip, making your other pathway. Do __1_ time per day.   Arm Posterior: Elbow to Shoulder - Sweep   Pump _5__ times from back of elbow to top of shoulder. Then inner to outer upper arm _5_ times, then outer arm again _5_ times. Then back to the pathways _2-3_ times. Do _1__ time per day.  ARM: Volar Wrist to Elbow - Sweep   Pump or stationary circles _5__ times from wrist to elbow making sure to do both sides of the forearm. Then retrace your steps to the outer arm, and the pathways _2-3_ times each. Do _1__ time per day.   ARM: Dorsum of Hand to Shoulder - Sweep   Pump or stationary circles _5__ times on back of hand including knuckle spaces and individual fingers if needed working up towards the wrist, then retrace all your steps working back up the forearm, doing both sides; upper outer arm and back to your pathways _2-3_ times each. Then do 5 circles again at uninvolved armpit and involved groin where you started! Good job!! Do __1_ time per day.   Flexion (Eccentric) - Active-Assist (Cane)              Use unaffected arm to push affected arm forward. Avoid hiking shoulder (shoulder should NOT touch cheek). Keep palm relaxed. Slowly lower affected arm. Hold stretch for _5_ seconds repeating _5-10_ times,  _1-2_ times a day.  Abduction (Eccentric) - Active-Assist (Cane)    Use unaffected arm to push affected arm out to side. Avoid hiking shoulder (shoulder should NOT touch cheek). Keep palm relaxed. Slowly lower affected arm. Hold stretch _5_ seconds repeating _5-10_ times, _1-2_ times a day.  Cane Exercise: Extension   Stand holding cane behind back with both hands palm-up. Lift the cane away from body until gentle stretch felt. Do NOT lean forward.  Hold __5__ seconds. Repeat _5-10___ times. Do _1-2___ sessions per day.

## 2023-05-09 ENCOUNTER — Ambulatory Visit: Payer: Medicare PPO

## 2023-05-09 ENCOUNTER — Telehealth: Payer: Self-pay | Admitting: Neurology

## 2023-05-09 ENCOUNTER — Encounter: Payer: Self-pay | Admitting: Neurology

## 2023-05-09 DIAGNOSIS — I972 Postmastectomy lymphedema syndrome: Secondary | ICD-10-CM | POA: Diagnosis not present

## 2023-05-09 DIAGNOSIS — Z96612 Presence of left artificial shoulder joint: Secondary | ICD-10-CM | POA: Diagnosis not present

## 2023-05-09 DIAGNOSIS — M25612 Stiffness of left shoulder, not elsewhere classified: Secondary | ICD-10-CM | POA: Diagnosis not present

## 2023-05-09 DIAGNOSIS — Z4789 Encounter for other orthopedic aftercare: Secondary | ICD-10-CM

## 2023-05-09 NOTE — Telephone Encounter (Signed)
 Attempted to call to discuss results of MRI lumbar spine. Left message asking for call back and sent MyChart message.  Rommie Coats, MD [facility]rology

## 2023-05-09 NOTE — Therapy (Signed)
 OUTPATIENT PT UPPER EXTREMITY LYMPHEDEMA TREATMENT  Patient Name: Sarah TURCZYN MRN: [id number] DOB:1946-03-04, 78 y.o., female Today's Date: 05/09/2023  END OF SESSION:  PT End of Session - 05/09/23 1108     Visit Number 17    Number of Visits 25    Date for PT Re-Evaluation 05/28/23    Authorization - Visit Number 3    Authorization - Number of Visits 12    PT Start Time 1106    PT Stop Time 1206    PT Time Calculation (min) 60 min    Activity Tolerance Patient tolerated treatment well    Behavior During Therapy [facility] for tasks assessed/performed                 Past Medical History:  Diagnosis Date   Basal cell carcinoma 1994   right side of node   Benign positional vertigo    with rolling over    Celiac disease    Dysrhythmia    Generalized anxiety disorder    GERD (gastroesophageal reflux disease)    Graves disease 2007   thyroid  nodule   Hard of hearing    Heart murmur    History of breast cancer 1995   left - treated with mastectomy   History of radiation therapy 2001   37 sessions following recurrence of breast cancer in left chest wall   Hypertension    Knee pain, bilateral 12/14/2014   Major depressive disorder in remission    Mild persistent asthma, uncomplicated 06/29/2015   Nocturia    OA (osteoarthritis) of hip 09/08/2015   Ovarian mass    small solid nodule 8x10mm: Right ovary - stable since 2000, follow u/s do not show this.   Renal mass 2021   followed by Dr. Secundino Dach, [facility].  Having every 6 month follow up   Seasonal allergies    Stress fracture of metatarsal bone of left foot 12/15/2019   Vegetarian diet    Past Surgical History:  Procedure Laterality Date   APPENDECTOMY     basal cell removal  7/94   right nose   BREAST BIOPSY Right 1987   benign fibroadenoma   breast cancer recurrence Left 01/2000   at site of left chest wall after previous mastectomy with removal and treatment with radiaton.   CARPAL TUNNEL RELEASE Right  05/08/2013   Procedure: RIGHT CARPAL TUNNEL RELEASE;  Surgeon: Amelie Baize., MD;  Location: [facility];  Service: Orthopedics;  Laterality: Right;   CATARACT EXTRACTION     03/20/14 left eye, 12/15 right eye   COLPOSCOPY W/ BIOPSY / CURETTAGE  03/12/12   benign, but + HR HPV   EYE SURGERY     bilat cataract surgery    MASTECTOMY Left 3/95   Stage IIb 0/14 LN ER/ PR + with mastectomy - Dr. Loris Ros   MOHS SURGERY     right nare   OVARY SURGERY  1974   lt   REVERSE SHOULDER ARTHROPLASTY Left 02/26/2023   Procedure: REVERSE SHOULDER ARTHROPLASTY;  Surgeon: Janeth Medicus, MD;  Location: [facility] OR;  Service: Orthopedics;  Laterality: Left;   TONSILLECTOMY AND ADENOIDECTOMY  1950   TOTAL HIP ARTHROPLASTY Right 09/08/2015   Procedure: TOTAL HIP ARTHROPLASTY ANTERIOR APPROACH;  Surgeon: Liliane Rei, MD;  Location: [facility];  Service: Orthopedics;  Laterality: Right;   Patient Active Problem List   Diagnosis Date Noted   Shoulder fracture 02/23/2023   Closed fracture of proximal end of left humerus,  unspecified fracture morphology, initial encounter 02/22/2023   Thyroid  nodule 02/22/2023   Major depressive disorder in remission 03/29/2020   Generalized anxiety disorder    Benign positional vertigo    Hypertension    Heart murmur    Stress fracture of metatarsal bone of left foot 12/15/2019   History of revision of total replacement of right hip joint 11/26/2017   Leg edema, left 09/26/2016   Post-nasal drainage 03/30/2016   OA (osteoarthritis) of hip 09/08/2015   Mild persistent asthma, uncomplicated 06/29/2015   Hip pain, bilateral 12/14/2014   Knee pain, bilateral 12/14/2014   Postmenopausal atrophic vaginitis 02/23/2013   Graves disease 2007   History of radiation therapy 2001   History of breast cancer 1995   Basal cell carcinoma 1994    PCP: Barnetta Liberty, MD  REFERRING PROVIDER: Carter Clare, MD  REFERRING DIAG:  Diagnosis  C50.511,Z17.0  (ICD-10-CM) - Malignant neoplasm of lower-outer quadrant of right breast of female, estrogen receptor positive (HCC)  I89.0 (ICD-10-CM) - Lymphedema  Z47.89 orthopedic aftercare  S/P reverse total shoulder arthroplasty, left  Encounter for other orthopedic aftercare  Stiffness of left shoulder, not elsewhere classified  THERAPY DIAG:  Postmastectomy lymphedema  S/P reverse total shoulder arthroplasty, left  Encounter for other orthopedic aftercare  Stiffness of left shoulder, not elsewhere classified  ONSET DATE: 02/26/23  Rationale for Evaluation and Treatment: Rehabilitation  SUBJECTIVE                                                                                                                                                                                           SUBJECTIVE STATEMENT:  I stretched a lot this weekend and my Lt shoulder is still a little sore. I found myself sleeping on it this weekend which was great! I want to cont focusing on MLD to get the lymphedema as reduced as we can.   PERTINENT HISTORY: Recent fall with left arm fracture.  Surgery with Reverse TSR 02/26/23.  This is the same side as her mastectomy which was diagnosed in 1995 with chest wall recurrence in 2001.  No remaining lymph nodes.   Shoulder surgery: Dr. Carter Clare -See media section for post-op approved exercises and okay for compression therapy.    PAIN:  Are you having pain?  PAIN:  Are you having pain? Yesif I move in certain directions NPRS scale: 0/10 at best but reaches 3-4/10 with certain motions of my shoulder Pain location: left shoulder Pain orientation: Left  PAIN TYPE: aching Pain description: intermittent  Aggravating factors: reaching in certain directions, bed mobility, but overall feeling much better Relieving factors: rest, relaxing muscles, stretches  PRECAUTIONS: cleared for all ROM/activity of the shoulder per MD  RED FLAGS: None   WEIGHT BEARING  RESTRICTIONS: No  FALLS:  Has patient fallen in last 6 months? Yes. Number of falls 1 - does not remember details of her fall. Just remembers she was going outside.   LIVING ENVIRONMENT: Lives with: lives alone Has a CNA for assistance at home.   OCCUPATION: Retired  LEISURE: Not currently exercising  HAND DOMINANCE : Right  PRIOR LEVEL OF FUNCTION: Independent  PATIENT GOALS: prevent chronic lymphedema after this fall   OBJECTIVE (FROM EVAL) Note: Objective measures were completed at Evaluation unless otherwise noted.  COGNITION:  Overall cognitive status: Within functional limits for tasks assessed   PALPATION: +2 pitting edema back of hand, forearm, below, to around the distal 1/3rd of the upper arm.   OBSERVATIONS / OTHER ASSESSMENTS: bruising all fingers and hand, elbow, and upper arm from surgery.  Skin tight and shiny from edema.     POSTURE: wearing sling upon arrival and leaving  UPPER EXTREMITY AROM/PROM: Just starting wrist and elbow AROM - did not measure objectively.  No shoulder ROM allowed.   04/04/2023 Left UE AAROM elevation in supine=   LEFT UE AROM elevation in supine: 100  04/11/2023   LEFT ER AROM supine:17   LEFT IR AROM supine:68 30 deg abd      IN SUPINE 04/30/2023 Left shoulder flex AROM in supine;  117 with elbow flexed to initiate   IR AROM  to stomach    ER  AROM 30 degrees at 30 degrees abduction     LYMPHEDEMA ASSESSMENTS:  LANDMARK RIGHT eval  At axilla 30.4  15cm proximal to olecranon 29.4  10 cm proximal to olecranon process 28.3  Olecranon process 24.7  15cm proximal to ulnar styloid 22.1  10 cm proximal to ulnar styloid process 18.7  Just proximal to ulnar styloid process 16.2  Across hand at thumb web space 19.8  At base of 2nd digit 6.4  (Blank rows = not tested)  LANDMARK LEFT  eval LEFT 03/26/2023 LEFT 04/11/2023 LEFT  04/30/2023 LEFT 05/09/23  At axilla 33 31 29.6 29.7 29.7  15cm proximal to olecranon 32.5 29.4  28.8 28.4 27.8  10 cm proximal to olecranon process 34.5 30.2 28.0 27.6 27.4  Olecranon process 30.5 26.6 24.0 23.4 23.3  15cm proximal to ulnar styloid  28.7 25.6 23.0 23.0 23.3  10 cm proximal to ulnar styloid process 26.5 23.4 20.5 21.1 21.9  Just proximal to ulnar styloid process 18.5 17.2 15.9 15.8 15.9  Across hand at thumb web space 20.4 18.8 19.2 19.0 18.4  At base of 2nd digit 7.0 6.1 5.85 6.0 5.9  (Blank rows = not tested)  Volume (at eval):          Length 7cm Rt:                         Lt:                      04/30/2023  1596 ml difference   PATIENT SURVEYS:  Quick Dash 68% limited EVAL 04/29/2022     34%   TODAY'S TREATMENT:  DATE:  05/09/23: Manual Therapy MLD to Lt UE: Short neck, superficial and deep abdominals, Rt axillary nodes, Rt intact anterior thorax sequence, anterior inter-axillary anasotmosis, Lt inguinal nodes, Lt axillo-inguinal anastomosis, Lt chest near axilla where pt has some accumulation of fluid, and UE working from proximal to distal then retracing all steps P/ROM of Lt shoulder to pts tolerance into flex and scaption briefly, pt reports still feeling very sore from her stretches this weekend Therapeutic Exercises Briefly reviewed standing dowel extension and showed pt how she can work on IR with dowel behind back. She reports feeling good stretch with this.   05/07/23 Reviewed garments status about failing velcro and emailed Melissa at [facility] for direction on this x Therapeutic Exercise Discussed standing dowel motion to mimic reach for top of steering wheel Supine flexion AAROM with 1 finger only x 3 and then small circles at 90deg x 5 c/cc no assistance needed Sidelying AROM into abduction x 3 and flexion x 3 as tolerated Manual Therapy MLD to Lt UE superficial and deep abdominals  (reviewed with pt 5 diaphragmatic breaths here for home), Rt axillary nodes, anterior inter-axillary anasotmosis, Lt inguinal nodes, Lt axillo-inguinal anastomosis, Lt UE working from proximal to distal then retracing all steps reviewing all with pt throughout P/ROM of Lt shoulder to pts tolerance into flex, abd and er/IR  05/04/23: Manual Therapy MLD to Lt UE reviewing with pt per handout issued at last session: Short neck, superficial and deep abdominals (reviewed with pt 5 diaphragmatic breaths here for home), Rt axillary nodes, anterior inter-axillary anasotmosis, Lt inguinal nodes, Lt axillo-inguinal anastomosis, Lt UE working from proximal to distal then retracing all steps reviewing all with pt throughout P/ROM of Lt shoulder to pts tolerance into flex, abd and er/IR STM gently to ant and sup shoulder during P/ROM and along incision Assisted pt with donning her velcro compression garment at end of session. Also issued her an extra oversleeve we had to help her velcro stay as the ends of the velcro are starting to unfurl during day. Pt plans to call compression company about this.   05/02/23: Self Care Spent time reviewing the lymphatic system and that her accumulation at medial upper arm is probable due to now damaged lymphatic collectors from injury and then surgery. Answered pts questions regarding this. Then reviewed the MLD sequence by having pt return demo while therapist stood in front of pt having her return each step and explaining the sequence while performing. Instructed her that she can spend more time in area of fibrosis at medial upper arm. Pt was able to return good demo. Also offered pressure checks so pt would realize the lightness of the pressure and skin stretch, not slide. Issued handout as well.  Therapeutic Exercises Dowel exercises in standing in front of mirror into flex (done in both ways holid dowle horz and then vertically) x 5-7 reps each, then Lt UE abd x 5 reps  returning therapist demo and VC's to work on decreasing Lt scapular compensation as much as able (this will be some limited though always due to nature of her surgery) Pulleys into flex and abd x 2 mins each reviewing proper technique and to hold stretch at end of each rep. Pt reports feeling excellent stretch with these so instructed her how to order these on [employer] for continued home use.   04/30/2023 Measured arm circumference and AROM of left shoulder for re;eval Reviewed goals with pt and pt progress. Reviewed isometrics briefly with pt  at her request showing her proper way of standing close to wall and adjusting pressure according to how her shoulder feels Manual lymph drainage to left UE in supine: short neck, left inguinal nodes, left axillo-inguinal anastomosis, Rt axillary nodes, Rt anterior intact thorax sequence and anterior inter-axillary anastomosis, then left UE working proximal to distal then retracing all steps from fingers to axilla redirecting along pathways. PT applied Farrow wrap with assistance of pt to pull up      PATIENT EDUCATION:  Access Code: [email or web address] URL: [email or web address] Date: 04/13/2023 Prepared by: Sharon December  Exercises - Standing Isometric Shoulder Internal Rotation at Doorway  - 1 x daily - 7 x weekly - 3 sets - 10 reps - Standing Isometric Shoulder External Rotation with Doorway  - 1 x daily - 7 x weekly - 1 sets - 5-10 reps - Isometric Shoulder Flexion at Wall  - 1 x daily - 7 x weekly - 1 sets - 5-10 reps - 5 hold - Isometric Shoulder Extension at Wall  - 1 x daily - 7 x weekly - 1 sets - 5-10 reps - Isometric Shoulder Abduction at Wall  - 1 x daily - 7 x weekly - 1 sets - 5-10 reps   05/02/23: Education details: Self MLD and standing dowel exercises Person educated: Patient Education method: Explanation, Demonstration, and Verbal cues, and tactile cues, handout issued Education comprehension: verbalized understanding, returned  demonstration, tactile and VC's and pt will benefit from further review  HOME EXERCISE PROGRAM: Isometrics for Lt shoulder Standing dowel exercises Self MLD  ASSESSMENT:  CLINICAL IMPRESSION: Pt reports she is pleased with overall movement she is regaining in her Lt shoulder and reduction of fibrosis in Lt chest and UE. We discussed her garment a bit more during session and pt would like to try getting another one if her insurance will cover this time and may want to look into a different one that doesn't fasten on the back of the arm.   OBJECTIVE IMPAIRMENTS: decreased activity tolerance, decreased knowledge of condition, decreased knowledge of use of DME, decreased mobility, decreased ROM, and increased edema.   ACTIVITY LIMITATIONS: carrying, lifting, sleeping, bathing, dressing, and reach over head  PARTICIPATION LIMITATIONS: meal prep, cleaning, laundry, shopping, and community activity  PERSONAL FACTORS: Age and 1 comorbidity: ALND and radiation hx  are also affecting patient's functional outcome.   REHAB POTENTIAL: Excellent  CLINICAL DECISION MAKING: Evolving/moderate complexity  EVALUATION COMPLEXITY: Moderate  GOALS: Goals reviewed with patient? Yes  SHORT TERM GOALS: Target date: 04/08/23    Pt will be ind with self MLD for edema reduction Baseline: Goal status: In progress 2.  Pt will decrease volume to or less to demonstrate improving edema status Baseline:  Goal status: MET 1596  (8 ml more) 04/30/2023 3.  Pt will be evaluated and treated for shoulder with goals added as needed as instructed by MD during future visits.  Baseline:  Goal status:MET 04/04/2023  LONG TERM GOALS: Target date: 05/10/23  Pt will reduce to less than volume difference between sides to demonstrate no active lymphedema Baseline:  Goal status: MET 04/29/2022 (1596 ml) 2.  Pt will obtain compression as needed for discharge depending on reduction Baseline:  Goal status: MET  04/11/2023   3. Pt will be able to perform AROM  elevation in supine to atleast 120 degrees without compensation  Goal Status; In progress, 117 04/30/2023  4. Pt will be able to place 8 oz object on eye level shelf  without UT compensation  GOALStatus: In progress  5. Pt will be tolerant of resisted exs for improving UE strength at 7 weeks post op Goal Status; In progress ;started isometrics 6. Pt will be able to return to driving for greater independence Goal status: new   PLAN:  PT FREQUENCY: 3x/week  PT DURATION:  4 weeks  PLANNED INTERVENTIONS: 97164- PT Re-evaluation, 97110-Therapeutic exercises, 97530- Therapeutic activity, 97112- Neuromuscular re-education, 97535- Self Care, and 60454- Manual therapy  PLAN FOR NEXT SESSION: Review standing dowel exs prn, progress to strength, review and cont MLD prn; see Media section for protocol though physician has released her, next appt with surgeon 05/21/2022. Pt needs new script for flat knit but should wait to be measured until fibrosis further decreases.   Roslynn Coombes, PTA 05/09/23 1:38 PM   Cancer Rehab [phone]  Start with circles at neck on each side placing hands behind collarbones and circling back and in towards neck.  Deep Effective Breath   Standing, sitting, or laying down, place both hands on the belly. Take a deep breath IN, expanding the belly; then breath OUT, contracting the belly. Repeat __5__ times. Do __2-3__ sessions per day and before your self massage.   Axilla to Axilla - Sweep   On uninvolved side make 5 circles in the armpit, then pump _5__ times from involved armpit across chest to uninvolved armpit, making a pathway. Do _1__ time per day.  Copyright  VHI. All rights reserved.  Axilla to Inguinal Nodes - Sweep   On involved side, make 5 circles at groin at panty line, then pump _5__ times from armpit along side of trunk to outer hip, making your other pathway. Do __1_ time per day.   Arm  Posterior: Elbow to Shoulder - Sweep   Pump _5__ times from back of elbow to top of shoulder. Then inner to outer upper arm _5_ times, then outer arm again _5_ times. Then back to the pathways _2-3_ times. Do _1__ time per day.  ARM: Volar Wrist to Elbow - Sweep   Pump or stationary circles _5__ times from wrist to elbow making sure to do both sides of the forearm. Then retrace your steps to the outer arm, and the pathways _2-3_ times each. Do _1__ time per day.   ARM: Dorsum of Hand to Shoulder - Sweep   Pump or stationary circles _5__ times on back of hand including knuckle spaces and individual fingers if needed working up towards the wrist, then retrace all your steps working back up the forearm, doing both sides; upper outer arm and back to your pathways _2-3_ times each. Then do 5 circles again at uninvolved armpit and involved groin where you started! Good job!! Do __1_ time per day.   Flexion (Eccentric) - Active-Assist (Cane)              Use unaffected arm to push affected arm forward. Avoid hiking shoulder (shoulder should NOT touch cheek). Keep palm relaxed. Slowly lower affected arm. Hold stretch for _5_ seconds repeating _5-10_ times, _1-2_ times a day.  Abduction (Eccentric) - Active-Assist (Cane)    Use unaffected arm to push affected arm out to side. Avoid hiking shoulder (shoulder should NOT touch cheek). Keep palm relaxed. Slowly lower affected arm. Hold stretch _5_ seconds repeating _5-10_ times, _1-2_ times a day.  Cane Exercise: Extension   Stand holding cane behind back with both hands palm-up. Lift the cane away from body until gentle stretch felt. Do NOT lean forward.  Hold __5__ seconds. Repeat _5-10___ times. Do _1-2___ sessions per day.

## 2023-05-11 ENCOUNTER — Ambulatory Visit: Payer: Medicare PPO

## 2023-05-11 DIAGNOSIS — I972 Postmastectomy lymphedema syndrome: Secondary | ICD-10-CM

## 2023-05-11 DIAGNOSIS — Z4789 Encounter for other orthopedic aftercare: Secondary | ICD-10-CM | POA: Diagnosis not present

## 2023-05-11 DIAGNOSIS — M25612 Stiffness of left shoulder, not elsewhere classified: Secondary | ICD-10-CM

## 2023-05-11 DIAGNOSIS — Z96612 Presence of left artificial shoulder joint: Secondary | ICD-10-CM

## 2023-05-11 NOTE — Therapy (Signed)
OUTPATIENT PT UPPER EXTREMITY LYMPHEDEMA TREATMENT  Patient Name: Sarah Underwood MRN: [id number] DOB:February 06, 1946, 78 y.o., female Today's Date: 05/11/2023  END OF SESSION:  PT End of Session - 05/11/23 1059     Visit Number 18    Number of Visits 25    Date for PT Re-Evaluation 05/28/23    Authorization Type [employer] Auth needed    Authorization Time Period 12 visits approved 05/03/2023-06/01/2023    Authorization - Visit Number 4    Authorization - Number of Visits 12    PT Start Time 1100    PT Stop Time 1200    PT Time Calculation (min) 60 min    Activity Tolerance Patient tolerated treatment well    Behavior During Therapy WFL for tasks assessed/performed                 Past Medical History:  Diagnosis Date   Basal cell carcinoma 1994   right side of node   Benign positional vertigo    with rolling over    Celiac disease    Dysrhythmia    Generalized anxiety disorder    GERD (gastroesophageal reflux disease)    Graves disease 2007   thyroid nodule   Hard of hearing    Heart murmur    History of breast cancer 1995   left - treated with mastectomy   History of radiation therapy 2001   37 sessions following recurrence of breast cancer in left chest wall   Hypertension    Knee pain, bilateral 12/14/2014   Major depressive disorder in remission    Mild persistent asthma, uncomplicated 06/29/2015   Nocturia    OA (osteoarthritis) of hip 09/08/2015   Ovarian mass    small solid nodule 8x50mm: Right ovary - stable since 2000, follow u/s do not show this.   Renal mass 2021   followed by Dr. Berneice Heinrich, [facility].  Having every 6 month follow up   Seasonal allergies    Stress fracture of metatarsal bone of left foot 12/15/2019   Vegetarian diet    Past Surgical History:  Procedure Laterality Date   APPENDECTOMY     basal cell removal  7/94   right nose   BREAST BIOPSY Right 1987   benign fibroadenoma   breast cancer recurrence Left 01/2000   at site of left chest  wall after previous mastectomy with removal and treatment with radiaton.   CARPAL TUNNEL RELEASE Right 05/08/2013   Procedure: RIGHT CARPAL TUNNEL RELEASE;  Surgeon: Wyn Forster., MD;  Location: [facility];  Service: Orthopedics;  Laterality: Right;   CATARACT EXTRACTION     03/20/14 left eye, 12/15 right eye   COLPOSCOPY W/ BIOPSY / CURETTAGE  03/12/12   benign, but + HR HPV   EYE SURGERY     bilat cataract surgery    MASTECTOMY Left 3/95   Stage IIb 0/14 LN ER/ PR + with mastectomy - Dr. Mardella Layman   MOHS SURGERY     right nare   OVARY SURGERY  1974   lt   REVERSE SHOULDER ARTHROPLASTY Left 02/26/2023   Procedure: REVERSE SHOULDER ARTHROPLASTY;  Surgeon: Yolonda Kida, MD;  Location: [facility] OR;  Service: Orthopedics;  Laterality: Left;   TONSILLECTOMY AND ADENOIDECTOMY  1950   TOTAL HIP ARTHROPLASTY Right 09/08/2015   Procedure: TOTAL HIP ARTHROPLASTY ANTERIOR APPROACH;  Surgeon: Ollen Gross, MD;  Location: [facility];  Service: Orthopedics;  Laterality: Right;   Patient Active Problem List  Diagnosis Date Noted   Shoulder fracture 02/23/2023   Closed fracture of proximal end of left humerus, unspecified fracture morphology, initial encounter 02/22/2023   Thyroid nodule 02/22/2023   Major depressive disorder in remission 03/29/2020   Generalized anxiety disorder    Benign positional vertigo    Hypertension    Heart murmur    Stress fracture of metatarsal bone of left foot 12/15/2019   History of revision of total replacement of right hip joint 11/26/2017   Leg edema, left 09/26/2016   Post-nasal drainage 03/30/2016   OA (osteoarthritis) of hip 09/08/2015   Mild persistent asthma, uncomplicated 06/29/2015   Hip pain, bilateral 12/14/2014   Knee pain, bilateral 12/14/2014   Postmenopausal atrophic vaginitis 02/23/2013   Graves disease 2007   History of radiation therapy 2001   History of breast cancer 1995   Basal cell carcinoma 1994    PCP: Alysia Penna, MD  REFERRING PROVIDER: Duwayne Heck, MD  REFERRING DIAG:  Diagnosis  C50.511,Z17.0 (ICD-10-CM) - Malignant neoplasm of lower-outer quadrant of right breast of female, estrogen receptor positive (HCC)  I89.0 (ICD-10-CM) - Lymphedema  Z47.89 orthopedic aftercare  S/P reverse total shoulder arthroplasty, left  Encounter for other orthopedic aftercare  Stiffness of left shoulder, not elsewhere classified  THERAPY DIAG:  Postmastectomy lymphedema  S/P reverse total shoulder arthroplasty, left  Encounter for other orthopedic aftercare  Stiffness of left shoulder, not elsewhere classified  ONSET DATE: 02/26/23  Rationale for Evaluation and Treatment: Rehabilitation  SUBJECTIVE                                                                                                                                                                                           SUBJECTIVE STATEMENT:   The shoulder is doing great. Schuyler Amor is doing great. The wrap seems to be too big so we are looking at other options  PERTINENT HISTORY: Recent fall with left arm fracture.  Surgery with Reverse TSR 02/26/23.  This is the same side as her mastectomy which was diagnosed in 1995 with chest wall recurrence in 2001.  No remaining lymph nodes.   Shoulder surgery: Dr. Duwayne Heck -See media section for post-op approved exercises and okay for compression therapy.    PAIN:  Are you having pain?  PAIN:  Are you having pain? Yesif I move in certain directions NPRS scale: 0/10 at best but reaches 3/10 with certain motions of my shoulder, left thumb 2/10 Pain location: left shoulder Pain orientation: Left  PAIN TYPE: aching Pain description: intermittent  Aggravating factors: reaching in certain directions, bed mobility, but overall feeling much better Relieving factors: rest, relaxing  muscles, stretches   PRECAUTIONS: cleared for all ROM/activity of the shoulder per MD  RED  FLAGS: None   WEIGHT BEARING RESTRICTIONS: No  FALLS:  Has patient fallen in last 6 months? Yes. Number of falls 1 - does not remember details of her fall. Just remembers she was going outside.   LIVING ENVIRONMENT: Lives with: lives alone Has a CNA for assistance at home.   OCCUPATION: Retired  LEISURE: Not currently exercising  HAND DOMINANCE : Right  PRIOR LEVEL OF FUNCTION: Independent  PATIENT GOALS: prevent chronic lymphedema after this fall   OBJECTIVE (FROM EVAL) Note: Objective measures were completed at Evaluation unless otherwise noted.  COGNITION:  Overall cognitive status: Within functional limits for tasks assessed   PALPATION: +2 pitting edema back of hand, forearm, below, to around the distal 1/3rd of the upper arm.   OBSERVATIONS / OTHER ASSESSMENTS: bruising all fingers and hand, elbow, and upper arm from surgery.  Skin tight and shiny from edema.     POSTURE: wearing sling upon arrival and leaving  UPPER EXTREMITY AROM/PROM: Just starting wrist and elbow AROM - did not measure objectively.  No shoulder ROM allowed.   04/04/2023 Left UE AAROM elevation in supine=   LEFT UE AROM elevation in supine: 100  04/11/2023   LEFT ER AROM supine:17   LEFT IR AROM supine:68 30 deg abd      IN SUPINE 04/30/2023 Left shoulder flex AROM in supine;  117 with elbow flexed to initiate   IR AROM  to stomach    ER  AROM 30 degrees at 30 degrees abduction     LYMPHEDEMA ASSESSMENTS:  LANDMARK RIGHT eval  At axilla 30.4  15cm proximal to olecranon 29.4  10 cm proximal to olecranon process 28.3  Olecranon process 24.7  15cm proximal to ulnar styloid 22.1  10 cm proximal to ulnar styloid process 18.7  Just proximal to ulnar styloid process 16.2  Across hand at thumb web space 19.8  At base of 2nd digit 6.4  (Blank rows = not tested)  LANDMARK LEFT  eval LEFT 03/26/2023 LEFT 04/11/2023 LEFT  04/30/2023 LEFT 05/09/23  At axilla 33 31 29.6 29.7 29.7  15cm  proximal to olecranon 32.5 29.4 28.8 28.4 27.8  10 cm proximal to olecranon process 34.5 30.2 28.0 27.6 27.4  Olecranon process 30.5 26.6 24.0 23.4 23.3  15cm proximal to ulnar styloid  28.7 25.6 23.0 23.0 23.3  10 cm proximal to ulnar styloid process 26.5 23.4 20.5 21.1 21.9  Just proximal to ulnar styloid process 18.5 17.2 15.9 15.8 15.9  Across hand at thumb web space 20.4 18.8 19.2 19.0 18.4  At base of 2nd digit 7.0 6.1 5.85 6.0 5.9  (Blank rows = not tested)  Volume (at eval):          Length 7cm Rt:                         Lt:                      04/30/2023  1596 ml difference   PATIENT SURVEYS:  Quick Dash 68% limited EVAL 04/29/2022     34%   TODAY'S TREATMENT:  DATE:   05/11/2023 Wand shoulder flexion in standing x 4 held due to UT compensation Scapular retraction and shoulder extension with yellow band x 10 ea Sitting scapular PNF retraction and depression with PT resistance 2 x 10 Alternating isometrics IR and ER 2 x 20 sec Supine AROM left at 30 deg to 90 deg  x 10, emphasis on scapular depression Supine alphabet A-Z x 1 Updated HEP MLD to Lt UE: Short neck, superficial and deep abdominals, Rt axillary nodes, Rt intact anterior thorax sequence, anterior inter-axillary anasotmosis, Lt inguinal nodes, Lt axillo-inguinal anastomosis, Lt chest near axilla where pt has some accumulation of fluid, and UE working from proximal to distal then retracing all steps 05/09/23: Manual Therapy MLD to Lt UE: Short neck, superficial and deep abdominals, Rt axillary nodes, Rt intact anterior thorax sequence, anterior inter-axillary anasotmosis, Lt inguinal nodes, Lt axillo-inguinal anastomosis, Lt chest near axilla where pt has some accumulation of fluid, and UE working from proximal to distal then retracing all steps P/ROM of Lt shoulder  to pts tolerance into flex and scaption briefly, pt reports still feeling very sore from her stretches this weekend Therapeutic Exercises Briefly reviewed standing dowel extension and showed pt how she can work on IR with dowel behind back. She reports feeling good stretch with this.   05/07/23 Reviewed garments status about failing velcro and emailed Melissa at [facility] for direction on this x Therapeutic Exercise Discussed standing dowel motion to mimic reach for top of steering wheel Supine flexion AAROM with 1 finger only x 3 and then small circles at 90deg x 5 c/cc no assistance needed Sidelying AROM into abduction x 3 and flexion x 3 as tolerated Manual Therapy MLD to Lt UE superficial and deep abdominals (reviewed with pt 5 diaphragmatic breaths here for home), Rt axillary nodes, anterior inter-axillary anasotmosis, Lt inguinal nodes, Lt axillo-inguinal anastomosis, Lt UE working from proximal to distal then retracing all steps reviewing all with pt throughout P/ROM of Lt shoulder to pts tolerance into flex, abd and er/IR  05/04/23: Manual Therapy MLD to Lt UE reviewing with pt per handout issued at last session: Short neck, superficial and deep abdominals (reviewed with pt 5 diaphragmatic breaths here for home), Rt axillary nodes, anterior inter-axillary anasotmosis, Lt inguinal nodes, Lt axillo-inguinal anastomosis, Lt UE working from proximal to distal then retracing all steps reviewing all with pt throughout P/ROM of Lt shoulder to pts tolerance into flex, abd and er/IR STM gently to ant and sup shoulder during P/ROM and along incision Assisted pt with donning her velcro compression garment at end of session. Also issued her an extra oversleeve we had to help her velcro stay as the ends of the velcro are starting to unfurl during day. Pt plans to call compression company about this.   05/02/23: Self Care Spent time reviewing the lymphatic system and that her accumulation at medial  upper arm is probable due to now damaged lymphatic collectors from injury and then surgery. Answered pts questions regarding this. Then reviewed the MLD sequence by having pt return demo while therapist stood in front of pt having her return each step and explaining the sequence while performing. Instructed her that she can spend more time in area of fibrosis at medial upper arm. Pt was able to return good demo. Also offered pressure checks so pt would realize the lightness of the pressure and skin stretch, not slide. Issued handout as well.  Therapeutic Exercises Dowel exercises in standing in front of mirror  into flex (done in both ways holid dowle horz and then vertically) x 5-7 reps each, then Lt UE abd x 5 reps returning therapist demo and VC's to work on decreasing Lt scapular compensation as much as able (this will be some limited though always due to nature of her surgery) Pulleys into flex and abd x 2 mins each reviewing proper technique and to hold stretch at end of each rep. Pt reports feeling excellent stretch with these so instructed her how to order these on [employer] for continued home use.   04/30/2023 Measured arm circumference and AROM of left shoulder for re;eval Reviewed goals with pt and pt progress. Reviewed isometrics briefly with pt at her request showing her proper way of standing close to wall and adjusting pressure according to how her shoulder feels Manual lymph drainage to left UE in supine: short neck, left inguinal nodes, left axillo-inguinal anastomosis, Rt axillary nodes, Rt anterior intact thorax sequence and anterior inter-axillary anastomosis, then left UE working proximal to distal then retracing all steps from fingers to axilla redirecting along pathways. PT applied Farrow wrap with assistance of pt to pull up      PATIENT EDUCATION:  Access Code: [email or web address] URL: [email or web address] Date: 04/13/2023 Prepared by: Alvira Monday  Exercises - Standing  Isometric Shoulder Internal Rotation at Doorway  - 1 x daily - 7 x weekly - 3 sets - 10 reps - Standing Isometric Shoulder External Rotation with Doorway  - 1 x daily - 7 x weekly - 1 sets - 5-10 reps - Isometric Shoulder Flexion at Wall  - 1 x daily - 7 x weekly - 1 sets - 5-10 reps - 5 hold - Isometric Shoulder Extension at Wall  - 1 x daily - 7 x weekly - 1 sets - 5-10 reps - Isometric Shoulder Abduction at Wall  - 1 x daily - 7 x weekly - 1 sets - 5-10 reps   05/02/23: Education details: Self MLD and standing dowel exercises Person educated: Patient Education method: Explanation, Demonstration, and Verbal cues, and tactile cues, handout issued Education comprehension: verbalized understanding, returned demonstration, tactile and VC's and pt will benefit from further review  HOME EXERCISE PROGRAM: Scapular retraction, shoulder extension yellow band x 10 Supine alphabet Isometrics for Lt shoulder Standing dowel exercises Self MLD  ASSESSMENT:  CLINICAL IMPRESSION: Emphasized left shoulder strength in todays session in addition to MLD. Pt continues with significant left UT compensation limiting her left shoulder  AROM. No increased pain with new exercises added. Gave yellow band for home use  OBJECTIVE IMPAIRMENTS: decreased activity tolerance, decreased knowledge of condition, decreased knowledge of use of DME, decreased mobility, decreased ROM, and increased edema.   ACTIVITY LIMITATIONS: carrying, lifting, sleeping, bathing, dressing, and reach over head  PARTICIPATION LIMITATIONS: meal prep, cleaning, laundry, shopping, and community activity  PERSONAL FACTORS: Age and 1 comorbidity: ALND and radiation hx  are also affecting patient's functional outcome.   REHAB POTENTIAL: Excellent  CLINICAL DECISION MAKING: Evolving/moderate complexity  EVALUATION COMPLEXITY: Moderate  GOALS: Goals reviewed with patient? Yes  SHORT TERM GOALS: Target date: 04/08/23    Pt will be ind  with self MLD for edema reduction Baseline: Goal status: In progress 2.  Pt will decrease volume to or less to demonstrate improving edema status Baseline:  Goal status: MET 1596  (8 ml more) 04/30/2023 3.  Pt will be evaluated and treated for shoulder with goals added as needed as instructed by MD during future visits.  Baseline:  Goal status:MET 04/04/2023  LONG TERM GOALS: Target date: 05/10/23  Pt will reduce to less than volume difference between sides to demonstrate no active lymphedema Baseline:  Goal status: MET 04/29/2022 (1596 ml) 2.  Pt will obtain compression as needed for discharge depending on reduction Baseline:  Goal status: MET 04/11/2023   3. Pt will be able to perform AROM  elevation in supine to atleast 120 degrees without compensation  Goal Status; In progress, 117 04/30/2023  4. Pt will be able to place 8 oz object on eye level shelf without UT compensation  GOALStatus: In progress  5. Pt will be tolerant of resisted exs for improving UE strength at 7 weeks post op Goal Status; In progress ;started isometrics 6. Pt will be able to return to driving for greater independence Goal status: new   PLAN:  PT FREQUENCY: 3x/week  PT DURATION:  4 weeks  PLANNED INTERVENTIONS: 97164- PT Re-evaluation, 97110-Therapeutic exercises, 97530- Therapeutic activity, 97112- Neuromuscular re-education, 97535- Self Care, and 16109- Manual therapy  PLAN FOR NEXT SESSION: Review band exs; SR, ext Review standing dowel exs prn, progress  strength, review and cont MLD prn; see Media section for protocol though physician has released her, next appt with surgeon 05/21/2022. Pt needs new script for flat knit but should wait to be measured until fibrosis further decreases.   Berna Spare, PTA 05/11/23 12:15 PM   [facility] [phone]  Start with circles at neck on each side placing hands behind collarbones and circling back and in towards neck.  Deep Effective  Breath   Standing, sitting, or laying down, place both hands on the belly. Take a deep breath IN, expanding the belly; then breath OUT, contracting the belly. Repeat __5__ times. Do __2-3__ sessions per day and before your self massage.   Axilla to Axilla - Sweep   On uninvolved side make 5 circles in the armpit, then pump _5__ times from involved armpit across chest to uninvolved armpit, making a pathway. Do _1__ time per day.  Copyright  V[employer]. All rights reserved.  Axilla to Inguinal Nodes - Sweep   On involved side, make 5 circles at groin at panty line, then pump _5__ times from armpit along side of trunk to outer hip, making your other pathway. Do __1_ time per day.   Arm Posterior: Elbow to Shoulder - Sweep   Pump _5__ times from back of elbow to top of shoulder. Then inner to outer upper arm _5_ times, then outer arm again _5_ times. Then back to the pathways _2-3_ times. Do _1__ time per day.  ARM: Volar Wrist to Elbow - Sweep   Pump or stationary circles _5__ times from wrist to elbow making sure to do both sides of the forearm. Then retrace your steps to the outer arm, and the pathways _2-3_ times each. Do _1__ time per day.   ARM: Dorsum of Hand to Shoulder - Sweep   Pump or stationary circles _5__ times on back of hand including knuckle spaces and individual fingers if needed working up towards the wrist, then retrace all your steps working back up the forearm, doing both sides; upper outer arm and back to your pathways _2-3_ times each. Then do 5 circles again at uninvolved armpit and involved groin where you started! Good job!! Do __1_ time per day.   Flexion (Eccentric) - Active-Assist (Cane)              Use unaffected arm to push affected arm forward.  Avoid hiking shoulder (shoulder should NOT touch cheek). Keep palm relaxed. Slowly lower affected arm. Hold stretch for _5_ seconds repeating _5-10_ times, _1-2_ times a day.  Abduction (Eccentric) -  Active-Assist (Cane)    Use unaffected arm to push affected arm out to side. Avoid hiking shoulder (shoulder should NOT touch cheek). Keep palm relaxed. Slowly lower affected arm. Hold stretch _5_ seconds repeating _5-10_ times, _1-2_ times a day.  Cane Exercise: Extension   Stand holding cane behind back with both hands palm-up. Lift the cane away from body until gentle stretch felt. Do NOT lean forward.  Hold __5__ seconds. Repeat _5-10___ times. Do _1-2___ sessions per day.

## 2023-05-14 ENCOUNTER — Ambulatory Visit: Payer: Medicare PPO

## 2023-05-14 DIAGNOSIS — M25612 Stiffness of left shoulder, not elsewhere classified: Secondary | ICD-10-CM

## 2023-05-14 DIAGNOSIS — Z96612 Presence of left artificial shoulder joint: Secondary | ICD-10-CM | POA: Diagnosis not present

## 2023-05-14 DIAGNOSIS — I972 Postmastectomy lymphedema syndrome: Secondary | ICD-10-CM | POA: Diagnosis not present

## 2023-05-14 DIAGNOSIS — Z4789 Encounter for other orthopedic aftercare: Secondary | ICD-10-CM | POA: Diagnosis not present

## 2023-05-14 NOTE — Patient Instructions (Addendum)
 Sarah Underwood

## 2023-05-14 NOTE — Therapy (Signed)
OUTPATIENT PT UPPER EXTREMITY LYMPHEDEMA TREATMENT  Patient Name: Sarah Underwood MRN: [id number] DOB:1945-05-07, 78 y.o., female Today's Date: 05/14/2023  END OF SESSION:  PT End of Session - 05/14/23 1144     Visit Number 19    Number of Visits 25    Date for PT Re-Evaluation 05/28/23    Authorization - Visit Number 5    Authorization - Number of Visits 12    PT Start Time 1107    PT Stop Time 1204    PT Time Calculation (min) 57 min    Activity Tolerance Patient tolerated treatment well    Behavior During Therapy [facility] for tasks assessed/performed                 Past Medical History:  Diagnosis Date   Basal cell carcinoma 1994   right side of node   Benign positional vertigo    with rolling over    Celiac disease    Dysrhythmia    Generalized anxiety disorder    GERD (gastroesophageal reflux disease)    Graves disease 2007   thyroid nodule   Hard of hearing    Heart murmur    History of breast cancer 1995   left - treated with mastectomy   History of radiation therapy 2001   37 sessions following recurrence of breast cancer in left chest wall   Hypertension    Knee pain, bilateral 12/14/2014   Major depressive disorder in remission    Mild persistent asthma, uncomplicated 06/29/2015   Nocturia    OA (osteoarthritis) of hip 09/08/2015   Ovarian mass    small solid nodule 8x97mm: Right ovary - stable since 2000, follow u/s do not show this.   Renal mass 2021   followed by Dr. Berneice Heinrich, [facility].  Having every 6 month follow up   Seasonal allergies    Stress fracture of metatarsal bone of left foot 12/15/2019   Vegetarian diet    Past Surgical History:  Procedure Laterality Date   APPENDECTOMY     basal cell removal  7/94   right nose   BREAST BIOPSY Right 1987   benign fibroadenoma   breast cancer recurrence Left 01/2000   at site of left chest wall after previous mastectomy with removal and treatment with radiaton.   CARPAL TUNNEL RELEASE Right  05/08/2013   Procedure: RIGHT CARPAL TUNNEL RELEASE;  Surgeon: Wyn Forster., MD;  Location: [facility];  Service: Orthopedics;  Laterality: Right;   CATARACT EXTRACTION     03/20/14 left eye, 12/15 right eye   COLPOSCOPY W/ BIOPSY / CURETTAGE  03/12/12   benign, but + HR HPV   EYE SURGERY     bilat cataract surgery    MASTECTOMY Left 3/95   Stage IIb 0/14 LN ER/ PR + with mastectomy - Dr. Mardella Layman   MOHS SURGERY     right nare   OVARY SURGERY  1974   lt   REVERSE SHOULDER ARTHROPLASTY Left 02/26/2023   Procedure: REVERSE SHOULDER ARTHROPLASTY;  Surgeon: Yolonda Kida, MD;  Location: [facility] OR;  Service: Orthopedics;  Laterality: Left;   TONSILLECTOMY AND ADENOIDECTOMY  1950   TOTAL HIP ARTHROPLASTY Right 09/08/2015   Procedure: TOTAL HIP ARTHROPLASTY ANTERIOR APPROACH;  Surgeon: Ollen Gross, MD;  Location: [facility]S;  Service: Orthopedics;  Laterality: Right;   Patient Active Problem List   Diagnosis Date Noted   Shoulder fracture 02/23/2023   Closed fracture of proximal end of left humerus,  unspecified fracture morphology, initial encounter 02/22/2023   Thyroid nodule 02/22/2023   Major depressive disorder in remission 03/29/2020   Generalized anxiety disorder    Benign positional vertigo    Hypertension    Heart murmur    Stress fracture of metatarsal bone of left foot 12/15/2019   History of revision of total replacement of right hip joint 11/26/2017   Leg edema, left 09/26/2016   Post-nasal drainage 03/30/2016   OA (osteoarthritis) of hip 09/08/2015   Mild persistent asthma, uncomplicated 06/29/2015   Hip pain, bilateral 12/14/2014   Knee pain, bilateral 12/14/2014   Postmenopausal atrophic vaginitis 02/23/2013   Graves disease 2007   History of radiation therapy 2001   History of breast cancer 1995   Basal cell carcinoma 1994    PCP: Alysia Penna, MD  REFERRING PROVIDER: Duwayne Heck, MD  REFERRING DIAG:  Diagnosis  C50.511,Z17.0  (ICD-10-CM) - Malignant neoplasm of lower-outer quadrant of right breast of female, estrogen receptor positive (HCC)  I89.0 (ICD-10-CM) - Lymphedema  Z47.89 orthopedic aftercare  S/P reverse total shoulder arthroplasty, left  Encounter for other orthopedic aftercare  Stiffness of left shoulder, not elsewhere classified  THERAPY DIAG:  Postmastectomy lymphedema  S/P reverse total shoulder arthroplasty, left  Encounter for other orthopedic aftercare  Stiffness of left shoulder, not elsewhere classified  ONSET DATE: 02/26/23  Rationale for Evaluation and Treatment: Rehabilitation  SUBJECTIVE                                                                                                                                                                                           SUBJECTIVE STATEMENT:  I want to review the cane exercises again and anything else I can do to help decrease my lifting my shoulder.   PERTINENT HISTORY: Recent fall with left arm fracture.  Surgery with Reverse TSR 02/26/23.  This is the same side as her mastectomy which was diagnosed in 1995 with chest wall recurrence in 2001.  No remaining lymph nodes.   Shoulder surgery: Dr. Duwayne Heck -See media section for post-op approved exercises and okay for compression therapy.    PAIN:  Are you having pain?  PAIN:  Are you having pain? Yesif I move in certain directions NPRS scale: 0/10 at best but reaches 3/10 with certain motions of my shoulder, left thumb 2/10 Pain location: left shoulder Pain orientation: Left  PAIN TYPE: aching Pain description: intermittent  Aggravating factors: reaching in certain directions, bed mobility, but overall feeling much better Relieving factors: rest, relaxing muscles, stretches   PRECAUTIONS: cleared for all ROM/activity of the shoulder per MD  RED FLAGS: None  WEIGHT BEARING RESTRICTIONS: No  FALLS:  Has patient fallen in last 6 months? Yes. Number of falls 1 -  does not remember details of her fall. Just remembers she was going outside.   LIVING ENVIRONMENT: Lives with: lives alone Has a CNA for assistance at home.   OCCUPATION: Retired  LEISURE: Not currently exercising  HAND DOMINANCE : Right  PRIOR LEVEL OF FUNCTION: Independent  PATIENT GOALS: prevent chronic lymphedema after this fall   OBJECTIVE (FROM EVAL) Note: Objective measures were completed at Evaluation unless otherwise noted.  COGNITION:  Overall cognitive status: Within functional limits for tasks assessed   PALPATION: +2 pitting edema back of hand, forearm, below, to around the distal 1/3rd of the upper arm.   OBSERVATIONS / OTHER ASSESSMENTS: bruising all fingers and hand, elbow, and upper arm from surgery.  Skin tight and shiny from edema.     POSTURE: wearing sling upon arrival and leaving  UPPER EXTREMITY AROM/PROM: Just starting wrist and elbow AROM - did not measure objectively.  No shoulder ROM allowed.   04/04/2023 Left UE AAROM elevation in supine=   LEFT UE AROM elevation in supine: 100  04/11/2023   LEFT ER AROM supine:17   LEFT IR AROM supine:68 30 deg abd      IN SUPINE 04/30/2023 Left shoulder flex AROM in supine;  117 with elbow flexed to initiate   IR AROM  to stomach    ER  AROM 30 degrees at 30 degrees abduction     LYMPHEDEMA ASSESSMENTS:  LANDMARK RIGHT eval  At axilla 30.4  15cm proximal to olecranon 29.4  10 cm proximal to olecranon process 28.3  Olecranon process 24.7  15cm proximal to ulnar styloid 22.1  10 cm proximal to ulnar styloid process 18.7  Just proximal to ulnar styloid process 16.2  Across hand at thumb web space 19.8  At base of 2nd digit 6.4  (Blank rows = not tested)  LANDMARK LEFT  eval LEFT 03/26/2023 LEFT 04/11/2023 LEFT  04/30/2023 LEFT 05/09/23  At axilla 33 31 29.6 29.7 29.7  15cm proximal to olecranon 32.5 29.4 28.8 28.4 27.8  10 cm proximal to olecranon process 34.5 30.2 28.0 27.6 27.4  Olecranon  process 30.5 26.6 24.0 23.4 23.3  15cm proximal to ulnar styloid  28.7 25.6 23.0 23.0 23.3  10 cm proximal to ulnar styloid process 26.5 23.4 20.5 21.1 21.9  Just proximal to ulnar styloid process 18.5 17.2 15.9 15.8 15.9  Across hand at thumb web space 20.4 18.8 19.2 19.0 18.4  At base of 2nd digit 7.0 6.1 5.85 6.0 5.9  (Blank rows = not tested)  Volume (at eval):          Length 7cm Rt:                         Lt:                      04/30/2023  1596 ml difference   PATIENT SURVEYS:  Quick Dash 68% limited EVAL 04/29/2022     34%   TODAY'S TREATMENT:  DATE:  05/14/23: Therapeutic Exercises Reviewed dowel exercises into flex and abd x 5 reps in front of mirror returning therapist demo Forearms on wall with yellow theraband for scap retract x 5 each and then wall walks in small motion up and down x 5 each with tactile cues to decrease Lt scapular compensation Lt shoulder ext x 10 with yellow theraband over door A/ROM in front of mirror for Lt shoulder into flex and abd multiple times working on decreasing scapular compensation and paying attention to what the compensation feels like and looks like. Pt was able to verbalize good understanding after practicing this. Manual Therapy P/ROM to Lt shoulder in supine into flex, scaption, er and IR with scapular depression throughout by therapist. Pain reported in posterior shoulder at end scaption motions so limited motion here  05/11/2023 Wand shoulder flexion in standing x 4 held due to UT compensation Scapular retraction and shoulder extension with yellow band x 10 ea Sitting scapular PNF retraction and depression with PT resistance 2 x 10 Alternating isometrics IR and ER 2 x 20 sec Supine AROM left at 30 deg to 90 deg  x 10, emphasis on scapular depression Supine alphabet A-Z x  1 Updated HEP MLD to Lt UE: Short neck, superficial and deep abdominals, Rt axillary nodes, Rt intact anterior thorax sequence, anterior inter-axillary anasotmosis, Lt inguinal nodes, Lt axillo-inguinal anastomosis, Lt chest near axilla where pt has some accumulation of fluid, and UE working from proximal to distal then retracing all steps 05/09/23: Manual Therapy MLD to Lt UE: Short neck, superficial and deep abdominals, Rt axillary nodes, Rt intact anterior thorax sequence, anterior inter-axillary anasotmosis, Lt inguinal nodes, Lt axillo-inguinal anastomosis, Lt chest near axilla where pt has some accumulation of fluid, and UE working from proximal to distal then retracing all steps P/ROM of Lt shoulder to pts tolerance into flex and scaption briefly, pt reports still feeling very sore from her stretches this weekend Therapeutic Exercises Briefly reviewed standing dowel extension and showed pt how she can work on IR with dowel behind back. She reports feeling good stretch with this.   05/07/23 Reviewed garments status about failing velcro and emailed Melissa at [facility] for direction on this x Therapeutic Exercise Discussed standing dowel motion to mimic reach for top of steering wheel Supine flexion AAROM with 1 finger only x 3 and then small circles at 90deg x 5 c/cc no assistance needed Sidelying AROM into abduction x 3 and flexion x 3 as tolerated Manual Therapy MLD to Lt UE superficial and deep abdominals (reviewed with pt 5 diaphragmatic breaths here for home), Rt axillary nodes, anterior inter-axillary anasotmosis, Lt inguinal nodes, Lt axillo-inguinal anastomosis, Lt UE working from proximal to distal then retracing all steps reviewing all with pt throughout P/ROM of Lt shoulder to pts tolerance into flex, abd and er/IR  05/04/23: Manual Therapy MLD to Lt UE reviewing with pt per handout issued at last session: Short neck, superficial and deep abdominals (reviewed with pt 5  diaphragmatic breaths here for home), Rt axillary nodes, anterior inter-axillary anasotmosis, Lt inguinal nodes, Lt axillo-inguinal anastomosis, Lt UE working from proximal to distal then retracing all steps reviewing all with pt throughout P/ROM of Lt shoulder to pts tolerance into flex, abd and er/IR STM gently to ant and sup shoulder during P/ROM and along incision Assisted pt with donning her velcro compression garment at end of session. Also issued her an extra oversleeve we had to help her velcro stay as the ends of  the velcro are starting to unfurl during day. Pt plans to call compression company about this.       PATIENT EDUCATION:  Access Code: [email or web address] URL: [email or web address] Date: 04/13/2023 Prepared by: Alvira Monday  Exercises - Standing Isometric Shoulder Internal Rotation at Doorway  - 1 x daily - 7 x weekly - 3 sets - 10 reps - Standing Isometric Shoulder External Rotation with Doorway  - 1 x daily - 7 x weekly - 1 sets - 5-10 reps - Isometric Shoulder Flexion at Wall  - 1 x daily - 7 x weekly - 1 sets - 5-10 reps - 5 hold - Isometric Shoulder Extension at Wall  - 1 x daily - 7 x weekly - 1 sets - 5-10 reps - Isometric Shoulder Abduction at Wall  - 1 x daily - 7 x weekly - 1 sets - 5-10 reps   05/02/23: Education details: Forearms on wall for postural strength Person educated: Patient Education method: Explanation, Demonstration, and Verbal cues, and tactile cues, handout issued (forgot this today, will issue next session) Education comprehension: verbalized understanding, returned demonstration, tactile and VC's and pt will benefit from further review  HOME EXERCISE PROGRAM: Scapular retraction, shoulder extension yellow band x 10 Supine alphabet Isometrics for Lt shoulder Standing dowel exercises Self MLD Forearms on wall for scapular strength  ASSESSMENT:  CLINICAL IMPRESSION: Continued to focus on Lt shoulder and scapular strength with new  postural exercises. Also spent time focusing on A/ROM with correct technique using mirror for feedback. Then continued with P/ROM working within pts pain tolerance.   OBJECTIVE IMPAIRMENTS: decreased activity tolerance, decreased knowledge of condition, decreased knowledge of use of DME, decreased mobility, decreased ROM, and increased edema.   ACTIVITY LIMITATIONS: carrying, lifting, sleeping, bathing, dressing, and reach over head  PARTICIPATION LIMITATIONS: meal prep, cleaning, laundry, shopping, and community activity  PERSONAL FACTORS: Age and 1 comorbidity: ALND and radiation hx  are also affecting patient's functional outcome.   REHAB POTENTIAL: Excellent  CLINICAL DECISION MAKING: Evolving/moderate complexity  EVALUATION COMPLEXITY: Moderate  GOALS: Goals reviewed with patient? Yes  SHORT TERM GOALS: Target date: 04/08/23    Pt will be ind with self MLD for edema reduction Baseline: Goal status: In progress 2.  Pt will decrease volume to or less to demonstrate improving edema status Baseline:  Goal status: MET 1596  (8 ml more) 04/30/2023 3.  Pt will be evaluated and treated for shoulder with goals added as needed as instructed by MD during future visits.  Baseline:  Goal status:MET 04/04/2023  LONG TERM GOALS: Target date: 05/10/23  Pt will reduce to less than volume difference between sides to demonstrate no active lymphedema Baseline:  Goal status: MET 04/29/2022 (1596 ml) 2.  Pt will obtain compression as needed for discharge depending on reduction Baseline:  Goal status: MET 04/11/2023   3. Pt will be able to perform AROM  elevation in supine to atleast 120 degrees without compensation  Goal Status; In progress, 117 04/30/2023  4. Pt will be able to place 8 oz object on eye level shelf without UT compensation  GOALStatus: In progress  5. Pt will be tolerant of resisted exs for improving UE strength at 7 weeks post op Goal Status; In progress ;started  isometrics 6. Pt will be able to return to driving for greater independence Goal status: new   PLAN:  PT FREQUENCY: 3x/week  PT DURATION:  4 weeks  PLANNED INTERVENTIONS: 97164- PT Re-evaluation, 97110-Therapeutic exercises, 97530- Therapeutic activity,  40981- Neuromuscular re-education, [id number]- Self Care, and 82956- Manual therapy  PLAN FOR NEXT SESSION: Review band exs and new forearms on wall (issued handout below for new forearm exercises as this was forgotten to give to pt at this session); Review standing dowel exs prn, progress  strength, review and cont MLD prn; see Media section for protocol though physician has released her, next appt with surgeon 05/21/2022. Pt needs new script for flat knit but should wait to be measured until fibrosis further decreases.   Berna Spare, PTA 05/14/23 1:21 PM   05/14/23: Start with forearms on wall holding short yellow theraband in hands:  Pull down keeping elbow bent and squeezing shoulder blade towards spine at end of pull  2 sets of 5 each  2.   Inch forearms up wall keeping elbows in only as high as you can control by keeping the shoulder blade down  2 sets of 5 each  Next:  Throw long band over top of door, close door and pull down with Lt arm, when returning to start position keep shoulder down  Cancer Rehab [phone]

## 2023-05-16 ENCOUNTER — Ambulatory Visit: Payer: Medicare PPO

## 2023-05-16 DIAGNOSIS — Z4789 Encounter for other orthopedic aftercare: Secondary | ICD-10-CM | POA: Diagnosis not present

## 2023-05-16 DIAGNOSIS — I972 Postmastectomy lymphedema syndrome: Secondary | ICD-10-CM | POA: Diagnosis not present

## 2023-05-16 DIAGNOSIS — Z96612 Presence of left artificial shoulder joint: Secondary | ICD-10-CM

## 2023-05-16 DIAGNOSIS — M25612 Stiffness of left shoulder, not elsewhere classified: Secondary | ICD-10-CM

## 2023-05-16 NOTE — Therapy (Addendum)
OUTPATIENT PT UPPER EXTREMITY LYMPHEDEMA TREATMENT  Patient Name: Sarah Underwood MRN: [id number] DOB:11/29/1945, 78 y.o., female Today's Date: 05/16/2023  END OF SESSION:  PT End of Session - 05/16/23 1106     Visit Number 20    Number of Visits 25    Date for PT Re-Evaluation 05/28/23    Authorization Type [employer] Auth needed    Authorization Time Period 12 visits approved 05/03/2023-06/01/2023    Authorization - Visit Number 6    Authorization - Number of Visits 12    PT Start Time 1104    PT Stop Time 1212    PT Time Calculation (min) 68 min    Activity Tolerance Patient tolerated treatment well    Behavior During Therapy WFL for tasks assessed/performed                 Past Medical History:  Diagnosis Date   Basal cell carcinoma 1994   right side of node   Benign positional vertigo    with rolling over    Celiac disease    Dysrhythmia    Generalized anxiety disorder    GERD (gastroesophageal reflux disease)    Graves disease 2007   thyroid nodule   Hard of hearing    Heart murmur    History of breast cancer 1995   left - treated with mastectomy   History of radiation therapy 2001   37 sessions following recurrence of breast cancer in left chest wall   Hypertension    Knee pain, bilateral 12/14/2014   Major depressive disorder in remission    Mild persistent asthma, uncomplicated 06/29/2015   Nocturia    OA (osteoarthritis) of hip 09/08/2015   Ovarian mass    small solid nodule 8x24mm: Right ovary - stable since 2000, follow u/s do not show this.   Renal mass 2021   followed by Dr. Berneice Heinrich, [facility].  Having every 6 month follow up   Seasonal allergies    Stress fracture of metatarsal bone of left foot 12/15/2019   Vegetarian diet    Past Surgical History:  Procedure Laterality Date   APPENDECTOMY     basal cell removal  7/94   right nose   BREAST BIOPSY Right 1987   benign fibroadenoma   breast cancer recurrence Left 01/2000   at site of left chest  wall after previous mastectomy with removal and treatment with radiaton.   CARPAL TUNNEL RELEASE Right 05/08/2013   Procedure: RIGHT CARPAL TUNNEL RELEASE;  Surgeon: Wyn Forster., MD;  Location: [facility];  Service: Orthopedics;  Laterality: Right;   CATARACT EXTRACTION     03/20/14 left eye, 12/15 right eye   COLPOSCOPY W/ BIOPSY / CURETTAGE  03/12/12   benign, but + HR HPV   EYE SURGERY     bilat cataract surgery    MASTECTOMY Left 3/95   Stage IIb 0/14 LN ER/ PR + with mastectomy - Dr. Mardella Layman   MOHS SURGERY     right nare   OVARY SURGERY  1974   lt   REVERSE SHOULDER ARTHROPLASTY Left 02/26/2023   Procedure: REVERSE SHOULDER ARTHROPLASTY;  Surgeon: Yolonda Kida, MD;  Location: [facility] OR;  Service: Orthopedics;  Laterality: Left;   TONSILLECTOMY AND ADENOIDECTOMY  1950   TOTAL HIP ARTHROPLASTY Right 09/08/2015   Procedure: TOTAL HIP ARTHROPLASTY ANTERIOR APPROACH;  Surgeon: Ollen Gross, MD;  Location: [facility];  Service: Orthopedics;  Laterality: Right;   Patient Active Problem List  Diagnosis Date Noted   Shoulder fracture 02/23/2023   Closed fracture of proximal end of left humerus, unspecified fracture morphology, initial encounter 02/22/2023   Thyroid nodule 02/22/2023   Major depressive disorder in remission 03/29/2020   Generalized anxiety disorder    Benign positional vertigo    Hypertension    Heart murmur    Stress fracture of metatarsal bone of left foot 12/15/2019   History of revision of total replacement of right hip joint 11/26/2017   Leg edema, left 09/26/2016   Post-nasal drainage 03/30/2016   OA (osteoarthritis) of hip 09/08/2015   Mild persistent asthma, uncomplicated 06/29/2015   Hip pain, bilateral 12/14/2014   Knee pain, bilateral 12/14/2014   Postmenopausal atrophic vaginitis 02/23/2013   Graves disease 2007   History of radiation therapy 2001   History of breast cancer 1995   Basal cell carcinoma 1994    PCP: Alysia Penna, MD  REFERRING PROVIDER: Duwayne Heck, MD  REFERRING DIAG:  Diagnosis  C50.511,Z17.0 (ICD-10-CM) - Malignant neoplasm of lower-outer quadrant of right breast of female, estrogen receptor positive (HCC)  I89.0 (ICD-10-CM) - Lymphedema  Z47.89 orthopedic aftercare  S/P reverse total shoulder arthroplasty, left  Encounter for other orthopedic aftercare  Stiffness of left shoulder, not elsewhere classified  THERAPY DIAG:  Postmastectomy lymphedema  S/P reverse total shoulder arthroplasty, left  Encounter for other orthopedic aftercare  Stiffness of left shoulder, not elsewhere classified  ONSET DATE: 02/26/23  Rationale for Evaluation and Treatment: Rehabilitation  SUBJECTIVE                                                                                                                                                                                           SUBJECTIVE STATEMENT:  My shoulder felt a lot looser after I left here last time.  I can tell my upper trap muscles are just really tight.   PERTINENT HISTORY: Recent fall with left arm fracture.  Surgery with Reverse TSR 02/26/23.  This is the same side as her mastectomy which was diagnosed in 1995 with chest wall recurrence in 2001.  No remaining lymph nodes.   Shoulder surgery: Dr. Duwayne Heck -See media section for post-op approved exercises and okay for compression therapy.    PAIN:  Are you having pain?  PAIN:  Are you having pain? Yesif I move in certain directions NPRS scale: 0/10 at best but reaches 3/10 with certain motions of my shoulder, left thumb 2/10 Pain location: left shoulder Pain orientation: Left  PAIN TYPE: aching Pain description: intermittent  Aggravating factors: reaching in certain directions, bed mobility, but overall feeling much better Relieving factors: rest,  relaxing muscles, stretches, massage   PRECAUTIONS: cleared for all ROM/activity of the shoulder per MD  RED  FLAGS: None   WEIGHT BEARING RESTRICTIONS: No  FALLS:  Has patient fallen in last 6 months? Yes. Number of falls 1 - does not remember details of her fall. Just remembers she was going outside.   LIVING ENVIRONMENT: Lives with: lives alone Has a CNA for assistance at home.   OCCUPATION: Retired  LEISURE: Not currently exercising  HAND DOMINANCE : Right  PRIOR LEVEL OF FUNCTION: Independent  PATIENT GOALS: prevent chronic lymphedema after this fall   OBJECTIVE (FROM EVAL) Note: Objective measures were completed at Evaluation unless otherwise noted.  COGNITION:  Overall cognitive status: Within functional limits for tasks assessed   PALPATION: +2 pitting edema back of hand, forearm, below, to around the distal 1/3rd of the upper arm.   OBSERVATIONS / OTHER ASSESSMENTS: bruising all fingers and hand, elbow, and upper arm from surgery.  Skin tight and shiny from edema.     POSTURE: wearing sling upon arrival and leaving  UPPER EXTREMITY AROM/PROM: Just starting wrist and elbow AROM - did not measure objectively.  No shoulder ROM allowed.   04/04/2023 Left UE AAROM elevation in supine=   LEFT UE AROM elevation in supine: 100  04/11/2023   LEFT ER AROM supine:17   LEFT IR AROM supine:68 30 deg abd      IN SUPINE 04/30/2023 Left shoulder flex AROM in supine;  117 with elbow flexed to initiate   IR AROM  to stomach    ER  AROM 30 degrees at 30 degrees abduction     LYMPHEDEMA ASSESSMENTS:  LANDMARK RIGHT eval  At axilla 30.4  15cm proximal to olecranon 29.4  10 cm proximal to olecranon process 28.3  Olecranon process 24.7  15cm proximal to ulnar styloid 22.1  10 cm proximal to ulnar styloid process 18.7  Just proximal to ulnar styloid process 16.2  Across hand at thumb web space 19.8  At base of 2nd digit 6.4  (Blank rows = not tested)  LANDMARK LEFT  eval LEFT 03/26/2023 LEFT 04/11/2023 LEFT  04/30/2023 LEFT 05/09/23  At axilla 33 31 29.6 29.7 29.7  15cm  proximal to olecranon 32.5 29.4 28.8 28.4 27.8  10 cm proximal to olecranon process 34.5 30.2 28.0 27.6 27.4  Olecranon process 30.5 26.6 24.0 23.4 23.3  15cm proximal to ulnar styloid  28.7 25.6 23.0 23.0 23.3  10 cm proximal to ulnar styloid process 26.5 23.4 20.5 21.1 21.9  Just proximal to ulnar styloid process 18.5 17.2 15.9 15.8 15.9  Across hand at thumb web space 20.4 18.8 19.2 19.0 18.4  At base of 2nd digit 7.0 6.1 5.85 6.0 5.9  (Blank rows = not tested)  Volume (at eval):          Length 7cm Rt:                         Lt:                      04/30/2023  1596 ml difference   PATIENT SURVEYS:  Quick Dash 68% limited EVAL 04/29/2022     34%   TODAY'S TREATMENT:  DATE:  05/16/23: Therapeutic Exercises Pt performed A/ROM of shoulders in front of mirror for work on decreasing Lt scapular compensation with flex and scaption.  Forearms on wall with yellow theraband for scap retract x 10 each and then with no theraband wall walks in small motion up and down x 5 each with tactile cues to decrease Lt scapular compensation and to keep elbow at side. Handout issued Pulleys into flex x 2 mins and abd x 3 mins each with VC's to decrease scapular compensations and to encourage pt to use a slow pace so she can be more aware of when compensation begins and to stop lifting arm at that point.  Self Care Discussed current compression. Pt reports not wearing velcro compression garment much at all anymore as it has gotten too big with how much her arm has reduced. Pt would really like to get a compression sleeve. Had a demo one at the clinic that pt was able to partially pull on (was too small for her to pull all the way up her arm) and she was able to do this fairly well despite recent total reverse shoulder. Pt was very happy about this and would  like to look into seeing if her insurance will cover this.  Manual Therapy STM: started with pt sitting EOB for STM to Lt UT with cocoa butter for trigger point release, pt with multiple areas of tightness here. Then transitioned to supine for further STM to Lt pect tendon and lateral trunk during P/ROM P/ROM of Lt shoulder into flex and scaption as pt tolerated and with scapular depression by therapist as able   05/14/23: Therapeutic Exercises Reviewed dowel exercises into flex and abd x 5 reps in front of mirror returning therapist demo Forearms on wall with yellow theraband for scap retract x 5 each and then wall walks in small motion up and down x 5 each with tactile cues to decrease Lt scapular compensation Lt shoulder ext x 10 with yellow theraband over door A/ROM in front of mirror for Lt shoulder into flex and abd multiple times working on decreasing scapular compensation and paying attention to what the compensation feels like and looks like. Pt was able to verbalize good understanding after practicing this. Manual Therapy P/ROM to Lt shoulder in supine into flex, scaption, er and IR with scapular depression throughout by therapist. Pain reported in posterior shoulder at end scaption motions so limited motion here  05/11/2023 Wand shoulder flexion in standing x 4 held due to UT compensation Scapular retraction and shoulder extension with yellow band x 10 ea Sitting scapular PNF retraction and depression with PT resistance 2 x 10 Alternating isometrics IR and ER 2 x 20 sec Supine AROM left at 30 deg to 90 deg  x 10, emphasis on scapular depression Supine alphabet A-Z x 1 Updated HEP MLD to Lt UE: Short neck, superficial and deep abdominals, Rt axillary nodes, Rt intact anterior thorax sequence, anterior inter-axillary anasotmosis, Lt inguinal nodes, Lt axillo-inguinal anastomosis, Lt chest near axilla where pt has some accumulation of fluid, and UE working from proximal to distal then  retracing all steps 05/09/23: Manual Therapy MLD to Lt UE: Short neck, superficial and deep abdominals, Rt axillary nodes, Rt intact anterior thorax sequence, anterior inter-axillary anasotmosis, Lt inguinal nodes, Lt axillo-inguinal anastomosis, Lt chest near axilla where pt has some accumulation of fluid, and UE working from proximal to distal then retracing all steps P/ROM of Lt shoulder to pts tolerance into flex and scaption  briefly, pt reports still feeling very sore from her stretches this weekend Therapeutic Exercises Briefly reviewed standing dowel extension and showed pt how she can work on IR with dowel behind back. She reports feeling good stretch with this.   05/07/23 Reviewed garments status about failing velcro and emailed Melissa at [facility] for direction on this x Therapeutic Exercise Discussed standing dowel motion to mimic reach for top of steering wheel Supine flexion AAROM with 1 finger only x 3 and then small circles at 90deg x 5 c/cc no assistance needed Sidelying AROM into abduction x 3 and flexion x 3 as tolerated Manual Therapy MLD to Lt UE superficial and deep abdominals (reviewed with pt 5 diaphragmatic breaths here for home), Rt axillary nodes, anterior inter-axillary anasotmosis, Lt inguinal nodes, Lt axillo-inguinal anastomosis, Lt UE working from proximal to distal then retracing all steps reviewing all with pt throughout P/ROM of Lt shoulder to pts tolerance into flex, abd and er/IR  05/04/23: Manual Therapy MLD to Lt UE reviewing with pt per handout issued at last session: Short neck, superficial and deep abdominals (reviewed with pt 5 diaphragmatic breaths here for home), Rt axillary nodes, anterior inter-axillary anasotmosis, Lt inguinal nodes, Lt axillo-inguinal anastomosis, Lt UE working from proximal to distal then retracing all steps reviewing all with pt throughout P/ROM of Lt shoulder to pts tolerance into flex, abd and er/IR STM gently to ant and sup  shoulder during P/ROM and along incision Assisted pt with donning her velcro compression garment at end of session. Also issued her an extra oversleeve we had to help her velcro stay as the ends of the velcro are starting to unfurl during day. Pt plans to call compression company about this.       PATIENT EDUCATION:  Access Code: [email or web address] URL: [email or web address] Date: 04/13/2023 Prepared by: Alvira Monday  Exercises - Standing Isometric Shoulder Internal Rotation at Doorway  - 1 x daily - 7 x weekly - 3 sets - 10 reps - Standing Isometric Shoulder External Rotation with Doorway  - 1 x daily - 7 x weekly - 1 sets - 5-10 reps - Isometric Shoulder Flexion at Wall  - 1 x daily - 7 x weekly - 1 sets - 5-10 reps - 5 hold - Isometric Shoulder Extension at Wall  - 1 x daily - 7 x weekly - 1 sets - 5-10 reps - Isometric Shoulder Abduction at Wall  - 1 x daily - 7 x weekly - 1 sets - 5-10 reps   05/02/23: Education details: Forearms on wall for postural strength Person educated: Patient Education method: Programmer, multimedia, Demonstration, and Verbal cues, and tactile cues, handout issued (at 05/16/23 session) Education comprehension: verbalized understanding, returned demonstration, tactile and VC's and pt will benefit from further review  HOME EXERCISE PROGRAM: Scapular retraction, shoulder extension yellow band x 10 Supine alphabet Isometrics for Lt shoulder Standing dowel exercises Self MLD Forearms on wall for scapular strength with yellow theraband  ASSESSMENT:  CLINICAL IMPRESSION: Continued with focus on educating pt about compensatory techniques. Discussed how she will probably always have some compensation due to nature of her shoulder replacement (showed her anatomy pictures online of a reverse shoulder to help her better visualize how her shoulder works now). This seemed to help her have a better understanding of her shoulder mechanics and how she will probably always  compensate to a degree, however, we want to lessen that as much as able. Pt is able to demonstrate good muscle awareness of scapula better  than when we started physical therapy. Her Lt shoulder A/ROM is improving as well. Also further discussed compression needs today and pt would really like to pursue a flat knit sleeve now that we know she will be able to independently don this. So started pt account with [employer] so order can be placed through them for pt to get a Jobst Bella Strong size 3 sleeve, length long, and, a Jobst Bella Strong gauntlet size 2. She will benefit from having 2 of each sleeve and gauntlet. These need to be worn daily, 8-10 hours/day, and due to the chronic nature of lymphedema she will need to renew this quantity: 2 of each, every 6 months, x 30 years.   OBJECTIVE IMPAIRMENTS: decreased activity tolerance, decreased knowledge of condition, decreased knowledge of use of DME, decreased mobility, decreased ROM, and increased edema.   ACTIVITY LIMITATIONS: carrying, lifting, sleeping, bathing, dressing, and reach over head  PARTICIPATION LIMITATIONS: meal prep, cleaning, laundry, shopping, and community activity  PERSONAL FACTORS: Age and 1 comorbidity: ALND and radiation hx  are also affecting patient's functional outcome.   REHAB POTENTIAL: Excellent  CLINICAL DECISION MAKING: Evolving/moderate complexity  EVALUATION COMPLEXITY: Moderate  GOALS: Goals reviewed with patient? Yes  SHORT TERM GOALS: Target date: 04/08/23    Pt will be ind with self MLD for edema reduction Baseline: Goal status: In progress 2.  Pt will decrease volume to or less to demonstrate improving edema status Baseline:  Goal status: MET 1596  (8 ml more) 04/30/2023 3.  Pt will be evaluated and treated for shoulder with goals added as needed as instructed by MD during future visits.  Baseline:  Goal status:MET 04/04/2023  LONG TERM GOALS: Target date: 05/10/23  Pt will reduce to less than  volume difference between sides to demonstrate no active lymphedema Baseline:  Goal status: MET 04/29/2022 (1596 ml) 2.  Pt will obtain compression as needed for discharge depending on reduction Baseline:  Goal status: MET 04/11/2023   3. Pt will be able to perform AROM  elevation in supine to atleast 120 degrees without compensation  Goal Status; In progress, 117 04/30/2023  4. Pt will be able to place 8 oz object on eye level shelf without UT compensation  GOALStatus: In progress  5. Pt will be tolerant of resisted exs for improving UE strength at 7 weeks post op Goal Status; In progress ;started isometrics 6. Pt will be able to return to driving for greater independence Goal status: new   PLAN:  PT FREQUENCY: 3x/week  PT DURATION:  4 weeks  PLANNED INTERVENTIONS: 97164- PT Re-evaluation, 97110-Therapeutic exercises, 97530- Therapeutic activity, 97112- Neuromuscular re-education, 97535- Self Care, and 29562- Manual therapy  PLAN FOR NEXT SESSION: Measure pt for length of Bella Strong (fits into size 3 sleeve, size 2 gauntlet) and assist with ordering through [employer]. Review band exs and new forearms on wall; Review standing dowel exs prn, progress  strength, review and cont MLD prn; see Media section for protocol though physician has released her, next appt with surgeon 05/21/2022. Pt needs new script for flat knit but should wait to be measured until fibrosis further decreases.   Berna Spare, PTA 05/16/23 12:16 PM   05/14/23: Start with forearms on wall holding short yellow theraband in hands:  Pull down keeping elbow bent and squeezing shoulder blade towards spine at end of pull  2 sets of 5 each  2.   Inch forearms up wall keeping elbows in only as high as you  can control by keeping the shoulder blade down  2 sets of 5 each  Next:  Throw long band over top of door, close door and pull down with Lt arm, when returning to start position keep shoulder  down  Cancer Rehab [phone]

## 2023-05-18 ENCOUNTER — Ambulatory Visit: Payer: Medicare PPO

## 2023-05-18 DIAGNOSIS — M25522 Pain in left elbow: Secondary | ICD-10-CM | POA: Diagnosis not present

## 2023-05-18 DIAGNOSIS — Z96612 Presence of left artificial shoulder joint: Secondary | ICD-10-CM

## 2023-05-18 DIAGNOSIS — Z4789 Encounter for other orthopedic aftercare: Secondary | ICD-10-CM | POA: Diagnosis not present

## 2023-05-18 DIAGNOSIS — I972 Postmastectomy lymphedema syndrome: Secondary | ICD-10-CM

## 2023-05-18 DIAGNOSIS — M25612 Stiffness of left shoulder, not elsewhere classified: Secondary | ICD-10-CM | POA: Diagnosis not present

## 2023-05-18 DIAGNOSIS — M25512 Pain in left shoulder: Secondary | ICD-10-CM | POA: Diagnosis not present

## 2023-05-18 NOTE — Patient Instructions (Signed)
SHOULDER: Flexion - Supine (Cane)        [facility] [phone]    Hold cane in both hands. Raise arms up overhead. Do not allow back to arch. Hold _5__ seconds. Do __5__ times; __2__ times a day.       Copyright  VHI. All rights reserved.

## 2023-05-18 NOTE — Therapy (Signed)
OUTPATIENT PT UPPER EXTREMITY LYMPHEDEMA TREATMENT  Patient Name: LAVELLE BERLAND MRN: [id number] DOB:11-06-1945, 78 y.o., female Today's Date: 05/18/2023  END OF SESSION:  PT End of Session - 05/18/23 1059     Visit Number 21    Number of Visits 25    Date for PT Re-Evaluation 05/28/23    Authorization Type [employer] Auth needed    Authorization Time Period 12 visits approved 05/03/2023-06/01/2023    Authorization - Visit Number 7    Authorization - Number of Visits 12    PT Start Time 1104    PT Stop Time 1205    PT Time Calculation (min) 61 min    Activity Tolerance Patient tolerated treatment well    Behavior During Therapy WFL for tasks assessed/performed                 Past Medical History:  Diagnosis Date   Basal cell carcinoma 1994   right side of node   Benign positional vertigo    with rolling over    Celiac disease    Dysrhythmia    Generalized anxiety disorder    GERD (gastroesophageal reflux disease)    Graves disease 2007   thyroid nodule   Hard of hearing    Heart murmur    History of breast cancer 1995   left - treated with mastectomy   History of radiation therapy 2001   37 sessions following recurrence of breast cancer in left chest wall   Hypertension    Knee pain, bilateral 12/14/2014   Major depressive disorder in remission    Mild persistent asthma, uncomplicated 06/29/2015   Nocturia    OA (osteoarthritis) of hip 09/08/2015   Ovarian mass    small solid nodule 8x79mm: Right ovary - stable since 2000, follow u/s do not show this.   Renal mass 2021   followed by Dr. Berneice Heinrich, [facility].  Having every 6 month follow up   Seasonal allergies    Stress fracture of metatarsal bone of left foot 12/15/2019   Vegetarian diet    Past Surgical History:  Procedure Laterality Date   APPENDECTOMY     basal cell removal  7/94   right nose   BREAST BIOPSY Right 1987   benign fibroadenoma   breast cancer recurrence Left 01/2000   at site of left chest  wall after previous mastectomy with removal and treatment with radiaton.   CARPAL TUNNEL RELEASE Right 05/08/2013   Procedure: RIGHT CARPAL TUNNEL RELEASE;  Surgeon: Wyn Forster., MD;  Location: [facility];  Service: Orthopedics;  Laterality: Right;   CATARACT EXTRACTION     03/20/14 left eye, 12/15 right eye   COLPOSCOPY W/ BIOPSY / CURETTAGE  03/12/12   benign, but + HR HPV   EYE SURGERY     bilat cataract surgery    MASTECTOMY Left 3/95   Stage IIb 0/14 LN ER/ PR + with mastectomy - Dr. Mardella Layman   MOHS SURGERY     right nare   OVARY SURGERY  1974   lt   REVERSE SHOULDER ARTHROPLASTY Left 02/26/2023   Procedure: REVERSE SHOULDER ARTHROPLASTY;  Surgeon: Yolonda Kida, MD;  Location: [facility];  Service: Orthopedics;  Laterality: Left;   TONSILLECTOMY AND ADENOIDECTOMY  1950   TOTAL HIP ARTHROPLASTY Right 09/08/2015   Procedure: TOTAL HIP ARTHROPLASTY ANTERIOR APPROACH;  Surgeon: Ollen Gross, MD;  Location: [facility];  Service: Orthopedics;  Laterality: Right;   Patient Active Problem List  Diagnosis Date Noted   Shoulder fracture 02/23/2023   Closed fracture of proximal end of left humerus, unspecified fracture morphology, initial encounter 02/22/2023   Thyroid nodule 02/22/2023   Major depressive disorder in remission 03/29/2020   Generalized anxiety disorder    Benign positional vertigo    Hypertension    Heart murmur    Stress fracture of metatarsal bone of left foot 12/15/2019   History of revision of total replacement of right hip joint 11/26/2017   Leg edema, left 09/26/2016   Post-nasal drainage 03/30/2016   OA (osteoarthritis) of hip 09/08/2015   Mild persistent asthma, uncomplicated 06/29/2015   Hip pain, bilateral 12/14/2014   Knee pain, bilateral 12/14/2014   Postmenopausal atrophic vaginitis 02/23/2013   Graves disease 2007   History of radiation therapy 2001   History of breast cancer 1995   Basal cell carcinoma 1994    PCP: Alysia Penna, MD  REFERRING PROVIDER: Duwayne Heck, MD  REFERRING DIAG:  Diagnosis  C50.511,Z17.0 (ICD-10-CM) - Malignant neoplasm of lower-outer quadrant of right breast of female, estrogen receptor positive (HCC)  I89.0 (ICD-10-CM) - Lymphedema  Z47.89 orthopedic aftercare  S/P reverse total shoulder arthroplasty, left  Encounter for other orthopedic aftercare  Stiffness of left shoulder, not elsewhere classified  THERAPY DIAG:  Postmastectomy lymphedema  S/P reverse total shoulder arthroplasty, left  Encounter for other orthopedic aftercare  Stiffness of left shoulder, not elsewhere classified  ONSET DATE: 02/26/23  Rationale for Evaluation and Treatment: Rehabilitation  SUBJECTIVE                                                                                                                                                                                           SUBJECTIVE STATEMENT:  Still have trouble identifying the muscles I need to work on to strengthen my shoulder    PERTINENT HISTORY: Recent fall with left arm fracture.  Surgery with Reverse TSR 02/26/23.  This is the same side as her mastectomy which was diagnosed in 1995 with chest wall recurrence in 2001.  No remaining lymph nodes.   Shoulder surgery: Dr. Duwayne Heck -See media section for post-op approved exercises and okay for compression therapy.    PAIN:  Are you having pain?  PAIN:  Are you having pain? Yesif I move in certain directions NPRS scale: 0/10 at best but reaches 1/10 with certain motions of my shoulder, left thumb 2/10 Pain location: left shoulder Pain orientation: Left  PAIN TYPE: aching Pain description: intermittent  Aggravating factors: reaching in certain directions, bed mobility, but overall feeling much better Relieving factors: rest, relaxing muscles, stretches, massage   PRECAUTIONS: cleared  for all ROM/activity of the shoulder per MD  RED FLAGS: None   WEIGHT BEARING  RESTRICTIONS: No  FALLS:  Has patient fallen in last 6 months? Yes. Number of falls 1 - does not remember details of her fall. Just remembers she was going outside.   LIVING ENVIRONMENT: Lives with: lives alone Has a CNA for assistance at home.   OCCUPATION: Retired  LEISURE: Not currently exercising  HAND DOMINANCE : Right  PRIOR LEVEL OF FUNCTION: Independent  PATIENT GOALS: prevent chronic lymphedema after this fall   OBJECTIVE (FROM EVAL) Note: Objective measures were completed at Evaluation unless otherwise noted.  COGNITION:  Overall cognitive status: Within functional limits for tasks assessed   PALPATION: +2 pitting edema back of hand, forearm, below, to around the distal 1/3rd of the upper arm.   OBSERVATIONS / OTHER ASSESSMENTS: bruising all fingers and hand, elbow, and upper arm from surgery.  Skin tight and shiny from edema.     POSTURE: wearing sling upon arrival and leaving  UPPER EXTREMITY AROM/PROM: Just starting wrist and elbow AROM - did not measure objectively.  No shoulder ROM allowed.   04/04/2023 Left UE AAROM elevation in supine=   LEFT UE AROM elevation in supine: 100  04/11/2023   LEFT ER AROM supine:17   LEFT IR AROM supine:68 30 deg abd      IN SUPINE 04/30/2023 Left shoulder flex AROM in supine;  117 with elbow flexed to initiate   IR AROM  to stomach    ER  AROM 30 degrees at 30 degrees abduction     LYMPHEDEMA ASSESSMENTS:  LANDMARK RIGHT eval  At axilla 30.4  15cm proximal to olecranon 29.4  10 cm proximal to olecranon process 28.3  Olecranon process 24.7  15cm proximal to ulnar styloid 22.1  10 cm proximal to ulnar styloid process 18.7  Just proximal to ulnar styloid process 16.2  Across hand at thumb web space 19.8  At base of 2nd digit 6.4  (Blank rows = not tested)  LANDMARK LEFT  eval LEFT 03/26/2023 LEFT 04/11/2023 LEFT  04/30/2023 LEFT 05/09/23  At axilla 33 31 29.6 29.7 29.7  15cm proximal to olecranon 32.5 29.4  28.8 28.4 27.8  10 cm proximal to olecranon process 34.5 30.2 28.0 27.6 27.4  Olecranon process 30.5 26.6 24.0 23.4 23.3  15cm proximal to ulnar styloid  28.7 25.6 23.0 23.0 23.3  10 cm proximal to ulnar styloid process 26.5 23.4 20.5 21.1 21.9  Just proximal to ulnar styloid process 18.5 17.2 15.9 15.8 15.9  Across hand at thumb web space 20.4 18.8 19.2 19.0 18.4  At base of 2nd digit 7.0 6.1 5.85 6.0 5.9  (Blank rows = not tested)  Volume (at eval):          Length 7cm Rt:                         Lt:                      04/30/2023  1596 ml difference   PATIENT SURVEYS:  Quick Dash 68% limited EVAL 04/29/2022     34%   TODAY'S TREATMENT:  DATE:  05/18/2023 Measured length for sleeve 45 cm;requires long Val PTA came in and discussed sleeve info with pt. Awaiting script Tried pulleys but held secondary to difficulty getting it started Scapular retraction yellow band x 10, emphasis on retraction with depression Long yellow band over door with pt pulling down and then having assist of band to elevate while maintaining scapular depression Alternating isometrics IR/ER 2 x 20, rhythmic stabs at 90 deg flexion 1 x 20 secs Supine alphabet x 1 Supine AROM with elbow straight and raising from approx 30 degrees to 90 degrees with scapular depression x 5 with good control Supine wand for shoulder flexion to improve ROM, and to focus on scapular depression x 5. Updated HEP Finger ladder x 2  Shoulder Ranger with mild A of PT for elevation x 4-5 reps. Pt did a good job keeping her left UT quiet  05/16/23: Therapeutic Exercises Pt performed A/ROM of shoulders in front of mirror for work on decreasing Lt scapular compensation with flex and scaption.  Forearms on wall with yellow theraband for scap retract x 10 each and then with no theraband wall  walks in small motion up and down x 5 each with tactile cues to decrease Lt scapular compensation and to keep elbow at side. Handout issued Pulleys into flex x 2 mins and abd x 3 mins each with VC's to decrease scapular compensations and to encourage pt to use a slow pace so she can be more aware of when compensation begins and to stop lifting arm at that point.  Self Care Discussed current compression. Pt reports not wearing velcro compression garment much at all anymore as it has gotten too big with how much her arm has reduced. Pt would really like to get a compression sleeve. Had a demo one at the clinic that pt was able to partially pull on (was too small for her to pull all the way up her arm) and she was able to do this fairly well despite recent total reverse shoulder. Pt was very happy about this and would like to look into seeing if her insurance will cover this.  Manual Therapy STM: started with pt sitting EOB for STM to Lt UT with cocoa butter for trigger point release, pt with multiple areas of tightness here. Then transitioned to supine for further STM to Lt pect tendon and lateral trunk during P/ROM P/ROM of Lt shoulder into flex and scaption as pt tolerated and with scapular depression by therapist as able   05/14/23: Therapeutic Exercises Reviewed dowel exercises into flex and abd x 5 reps in front of mirror returning therapist demo Forearms on wall with yellow theraband for scap retract x 5 each and then wall walks in small motion up and down x 5 each with tactile cues to decrease Lt scapular compensation Lt shoulder ext x 10 with yellow theraband over door A/ROM in front of mirror for Lt shoulder into flex and abd multiple times working on decreasing scapular compensation and paying attention to what the compensation feels like and looks like. Pt was able to verbalize good understanding after practicing this. Manual Therapy P/ROM to Lt shoulder in supine into flex, scaption, er and  IR with scapular depression throughout by therapist. Pain reported in posterior shoulder at end scaption motions so limited motion here  05/11/2023 Wand shoulder flexion in standing x 4 held due to UT compensation Scapular retraction and shoulder extension with yellow band x 10 ea Sitting scapular PNF retraction and depression  with PT resistance 2 x 10 Alternating isometrics IR and ER 2 x 20 sec Supine AROM left at 30 deg to 90 deg  x 10, emphasis on scapular depression Supine alphabet A-Z x 1 Updated HEP MLD to Lt UE: Short neck, superficial and deep abdominals, Rt axillary nodes, Rt intact anterior thorax sequence, anterior inter-axillary anasotmosis, Lt inguinal nodes, Lt axillo-inguinal anastomosis, Lt chest near axilla where pt has some accumulation of fluid, and UE working from proximal to distal then retracing all steps 05/09/23: Manual Therapy MLD to Lt UE: Short neck, superficial and deep abdominals, Rt axillary nodes, Rt intact anterior thorax sequence, anterior inter-axillary anasotmosis, Lt inguinal nodes, Lt axillo-inguinal anastomosis, Lt chest near axilla where pt has some accumulation of fluid, and UE working from proximal to distal then retracing all steps P/ROM of Lt shoulder to pts tolerance into flex and scaption briefly, pt reports still feeling very sore from her stretches this weekend Therapeutic Exercises Briefly reviewed standing dowel extension and showed pt how she can work on IR with dowel behind back. She reports feeling good stretch with this.   05/07/23 Reviewed garments status about failing velcro and emailed Melissa at [facility] for direction on this x Therapeutic Exercise Discussed standing dowel motion to mimic reach for top of steering wheel Supine flexion AAROM with 1 finger only x 3 and then small circles at 90deg x 5 c/cc no assistance needed Sidelying AROM into abduction x 3 and flexion x 3 as tolerated Manual Therapy MLD to Lt UE superficial and deep  abdominals (reviewed with pt 5 diaphragmatic breaths here for home), Rt axillary nodes, anterior inter-axillary anasotmosis, Lt inguinal nodes, Lt axillo-inguinal anastomosis, Lt UE working from proximal to distal then retracing all steps reviewing all with pt throughout P/ROM of Lt shoulder to pts tolerance into flex, abd and er/IR  05/04/23: Manual Therapy MLD to Lt UE reviewing with pt per handout issued at last session: Short neck, superficial and deep abdominals (reviewed with pt 5 diaphragmatic breaths here for home), Rt axillary nodes, anterior inter-axillary anasotmosis, Lt inguinal nodes, Lt axillo-inguinal anastomosis, Lt UE working from proximal to distal then retracing all steps reviewing all with pt throughout P/ROM of Lt shoulder to pts tolerance into flex, abd and er/IR STM gently to ant and sup shoulder during P/ROM and along incision Assisted pt with donning her velcro compression garment at end of session. Also issued her an extra oversleeve we had to help her velcro stay as the ends of the velcro are starting to unfurl during day. Pt plans to call compression company about this.       PATIENT EDUCATION:  Access Code: [email or web address] URL: [email or web address] Date: 04/13/2023 Prepared by: Alvira Monday  Exercises - Standing Isometric Shoulder Internal Rotation at Doorway  - 1 x daily - 7 x weekly - 3 sets - 10 reps - Standing Isometric Shoulder External Rotation with Doorway  - 1 x daily - 7 x weekly - 1 sets - 5-10 reps - Isometric Shoulder Flexion at Wall  - 1 x daily - 7 x weekly - 1 sets - 5-10 reps - 5 hold - Isometric Shoulder Extension at Wall  - 1 x daily - 7 x weekly - 1 sets - 5-10 reps - Isometric Shoulder Abduction at Wall  - 1 x daily - 7 x weekly - 1 sets - 5-10 reps   05/02/23: Education details: Forearms on wall for postural strength Person educated: Patient Education method: Programmer, multimedia, Demonstration, and Verbal cues,  and tactile cues, handout  issued (at 05/16/23 session) Education comprehension: verbalized understanding, returned demonstration, tactile and VC's and pt will benefit from further review  HOME EXERCISE PROGRAM: Scapular retraction, shoulder extension yellow band x 10 Supine alphabet Isometrics for Lt shoulder Standing dowel exercises Self MLD Forearms on wall for scapular strength with yellow theraband  ASSESSMENT:  CLINICAL IMPRESSION: Continued with focus on educating pt about compensatory techniques with her UT. She showed significant improvement from when this therapist last saw her. She did very well with theraband over the door, and with the shoulder Ranger. Also made good progress with supine AROM flexion to 90 degrees when starting at approx 30 degrees flexion (blocked by PT). Pt requires fewer verbal and tactile cues to maintain good position of the left scapula for reaching as she is much more aware now.  OBJECTIVE IMPAIRMENTS: decreased activity tolerance, decreased knowledge of condition, decreased knowledge of use of DME, decreased mobility, decreased ROM, and increased edema.   ACTIVITY LIMITATIONS: carrying, lifting, sleeping, bathing, dressing, and reach over head  PARTICIPATION LIMITATIONS: meal prep, cleaning, laundry, shopping, and community activity  PERSONAL FACTORS: Age and 1 comorbidity: ALND and radiation hx  are also affecting patient's functional outcome.   REHAB POTENTIAL: Excellent  CLINICAL DECISION MAKING: Evolving/moderate complexity  EVALUATION COMPLEXITY: Moderate  GOALS: Goals reviewed with patient? Yes  SHORT TERM GOALS: Target date: 04/08/23    Pt will be ind with self MLD for edema reduction Baseline: Goal status: In progress 2.  Pt will decrease volume to or less to demonstrate improving edema status Baseline:  Goal status: MET 1596  (8 ml more) 04/30/2023 3.  Pt will be evaluated and treated for shoulder with goals added as needed as instructed by MD during  future visits.  Baseline:  Goal status:MET 04/04/2023  LONG TERM GOALS: Target date: 05/10/23  Pt will reduce to less than volume difference between sides to demonstrate no active lymphedema Baseline:  Goal status: MET 04/29/2022 (1596 ml) 2.  Pt will obtain compression as needed for discharge depending on reduction Baseline:  Goal status: MET 04/11/2023   3. Pt will be able to perform AROM  elevation in supine to atleast 120 degrees without compensation  Goal Status; In progress, 117 04/30/2023  4. Pt will be able to place 8 oz object on eye level shelf without UT compensation  GOALStatus: In progress  5. Pt will be tolerant of resisted exs for improving UE strength at 7 weeks post op Goal Status; In progress ;started isometrics 6. Pt will be able to return to driving for greater independence Goal status: new   PLAN:  PT FREQUENCY: 3x/week  PT DURATION:  4 weeks  PLANNED INTERVENTIONS: 97164- PT Re-evaluation, 97110-Therapeutic exercises, 97530- Therapeutic activity, 97112- Neuromuscular re-education, 97535- Self Care, and 11914- Manual therapy  PLAN FOR NEXT SESSION: Measure pt for length of Bella Strong (fits into size 3 sleeve, size 2 gauntlet) and assist with ordering through [employer]. Review band exs and new forearms on wall; Review standing dowel exs prn, progress  strength, review and cont MLD prn; see Media section for protocol though physician has released her, next appt with surgeon 05/21/2022. Pt needs new script for flat knit but should wait to be measured until fibrosis further decreases.   Berna Spare, PTA 05/18/23 12:29 PM   05/14/23: Start with forearms on wall holding short yellow theraband in hands:  Pull down keeping elbow bent and squeezing shoulder blade towards spine at end of  pull  2 sets of 5 each  2.   Inch forearms up wall keeping elbows in only as high as you can control by keeping the shoulder blade down  2 sets of 5  each  Next:  Throw long band over top of door, close door and pull down with Lt arm, when returning to start position keep shoulder down  Cancer Rehab [phone]

## 2023-05-21 ENCOUNTER — Ambulatory Visit: Payer: Medicare PPO

## 2023-05-21 DIAGNOSIS — I972 Postmastectomy lymphedema syndrome: Secondary | ICD-10-CM | POA: Diagnosis not present

## 2023-05-21 DIAGNOSIS — M25612 Stiffness of left shoulder, not elsewhere classified: Secondary | ICD-10-CM

## 2023-05-21 DIAGNOSIS — Z4789 Encounter for other orthopedic aftercare: Secondary | ICD-10-CM

## 2023-05-21 DIAGNOSIS — Z96612 Presence of left artificial shoulder joint: Secondary | ICD-10-CM

## 2023-05-21 NOTE — Therapy (Signed)
OUTPATIENT PT UPPER EXTREMITY LYMPHEDEMA TREATMENT  Patient Name: Sarah Underwood MRN: [id number] DOB:10/29/1945, 78 y.o., female Today's Date: 05/21/2023  END OF SESSION:  PT End of Session - 05/21/23 1203     Visit Number 22    Number of Visits 25    Date for PT Re-Evaluation 05/28/23    Authorization Type [employer] Auth needed    Authorization Time Period 12 visits approved 05/03/2023-06/01/2023    Authorization - Visit Number 8    Authorization - Number of Visits 12    PT Start Time 1204    PT Stop Time 1300    PT Time Calculation (min) 56 min    Activity Tolerance Patient tolerated treatment well    Behavior During Therapy WFL for tasks assessed/performed                 Past Medical History:  Diagnosis Date   Basal cell carcinoma 1994   right side of node   Benign positional vertigo    with rolling over    Celiac disease    Dysrhythmia    Generalized anxiety disorder    GERD (gastroesophageal reflux disease)    Graves disease 2007   thyroid nodule   Hard of hearing    Heart murmur    History of breast cancer 1995   left - treated with mastectomy   History of radiation therapy 2001   37 sessions following recurrence of breast cancer in left chest wall   Hypertension    Knee pain, bilateral 12/14/2014   Major depressive disorder in remission    Mild persistent asthma, uncomplicated 06/29/2015   Nocturia    OA (osteoarthritis) of hip 09/08/2015   Ovarian mass    small solid nodule 8x66mm: Right ovary - stable since 2000, follow u/s do not show this.   Renal mass 2021   followed by Dr. Berneice Heinrich, [facility].  Having every 6 month follow up   Seasonal allergies    Stress fracture of metatarsal bone of left foot 12/15/2019   Vegetarian diet    Past Surgical History:  Procedure Laterality Date   APPENDECTOMY     basal cell removal  7/94   right nose   BREAST BIOPSY Right 1987   benign fibroadenoma   breast cancer recurrence Left 01/2000   at site of left chest  wall after previous mastectomy with removal and treatment with radiaton.   CARPAL TUNNEL RELEASE Right 05/08/2013   Procedure: RIGHT CARPAL TUNNEL RELEASE;  Surgeon: Wyn Forster., MD;  Location: [facility];  Service: Orthopedics;  Laterality: Right;   CATARACT EXTRACTION     03/20/14 left eye, 12/15 right eye   COLPOSCOPY W/ BIOPSY / CURETTAGE  03/12/12   benign, but + HR HPV   EYE SURGERY     bilat cataract surgery    MASTECTOMY Left 3/95   Stage IIb 0/14 LN ER/ PR + with mastectomy - Dr. Mardella Layman   MOHS SURGERY     right nare   OVARY SURGERY  1974   lt   REVERSE SHOULDER ARTHROPLASTY Left 02/26/2023   Procedure: REVERSE SHOULDER ARTHROPLASTY;  Surgeon: Yolonda Kida, MD;  Location: [facility] OR;  Service: Orthopedics;  Laterality: Left;   TONSILLECTOMY AND ADENOIDECTOMY  1950   TOTAL HIP ARTHROPLASTY Right 09/08/2015   Procedure: TOTAL HIP ARTHROPLASTY ANTERIOR APPROACH;  Surgeon: Ollen Gross, MD;  Location: [facility];  Service: Orthopedics;  Laterality: Right;   Patient Active Problem List  Diagnosis Date Noted   Shoulder fracture 02/23/2023   Closed fracture of proximal end of left humerus, unspecified fracture morphology, initial encounter 02/22/2023   Thyroid nodule 02/22/2023   Major depressive disorder in remission 03/29/2020   Generalized anxiety disorder    Benign positional vertigo    Hypertension    Heart murmur    Stress fracture of metatarsal bone of left foot 12/15/2019   History of revision of total replacement of right hip joint 11/26/2017   Leg edema, left 09/26/2016   Post-nasal drainage 03/30/2016   OA (osteoarthritis) of hip 09/08/2015   Mild persistent asthma, uncomplicated 06/29/2015   Hip pain, bilateral 12/14/2014   Knee pain, bilateral 12/14/2014   Postmenopausal atrophic vaginitis 02/23/2013   Graves disease 2007   History of radiation therapy 2001   History of breast cancer 1995   Basal cell carcinoma 1994    PCP: Alysia Penna, MD  REFERRING PROVIDER: Duwayne Heck, MD  REFERRING DIAG:  Diagnosis  C50.511,Z17.0 (ICD-10-CM) - Malignant neoplasm of lower-outer quadrant of right breast of female, estrogen receptor positive (HCC)  I89.0 (ICD-10-CM) - Lymphedema  Z47.89 orthopedic aftercare  S/P reverse total shoulder arthroplasty, left  Encounter for other orthopedic aftercare  Stiffness of left shoulder, not elsewhere classified  THERAPY DIAG:  Postmastectomy lymphedema  S/P reverse total shoulder arthroplasty, left  Encounter for other orthopedic aftercare  Stiffness of left shoulder, not elsewhere classified  ONSET DATE: 02/26/23  Rationale for Evaluation and Treatment: Rehabilitation  SUBJECTIVE                                                                                                                                                                                           SUBJECTIVE STATEMENT:    I went home and ran out to my birdfeeders and slipped in mud and my feet went out from under me and went butt first but hit my left elbow and jarred my shoulder.. I went for an Xray and they didn't see anything. The [employer] parts are in place. Last night 2/10 in shoulder and back and couldn't get comfortable to sleep. Today its sore in my posterior left UE and my back  PERTINENT HISTORY: Recent fall with left arm fracture.  Surgery with Reverse TSR 02/26/23.  This is the same side as her mastectomy which was diagnosed in 1995 with chest wall recurrence in 2001.  No remaining lymph nodes.   Shoulder surgery: Dr. Duwayne Heck -See media section for post-op approved exercises and okay for compression therapy.    PAIN:  Are you having pain?  PAIN:  Are you having pain? Yesif I move  in certain directions NPRS scale: 1-2/10 at best but reaches 1/10 with certain motions of my shoulder, left thumb 2/10 Pain location: left shoulder Pain orientation: Left  PAIN TYPE: aching Pain description:  intermittent  Aggravating factors: reaching in certain directions, bed mobility, but overall feeling much better Relieving factors: rest, relaxing muscles, stretches, massage   PRECAUTIONS: cleared for all ROM/activity of the shoulder per MD  RED FLAGS: None   WEIGHT BEARING RESTRICTIONS: No  FALLS:  Has patient fallen in last 6 months? Yes. Number of falls 1 - does not remember details of her fall. Just remembers she was going outside.   LIVING ENVIRONMENT: Lives with: lives alone Has a CNA for assistance at home.   OCCUPATION: Retired  LEISURE: Not currently exercising  HAND DOMINANCE : Right  PRIOR LEVEL OF FUNCTION: Independent  PATIENT GOALS: prevent chronic lymphedema after this fall   OBJECTIVE (FROM EVAL) Note: Objective measures were completed at Evaluation unless otherwise noted.  COGNITION:  Overall cognitive status: Within functional limits for tasks assessed   PALPATION: +2 pitting edema back of hand, forearm, below, to around the distal 1/3rd of the upper arm.   OBSERVATIONS / OTHER ASSESSMENTS: bruising all fingers and hand, elbow, and upper arm from surgery.  Skin tight and shiny from edema.     POSTURE: wearing sling upon arrival and leaving  UPPER EXTREMITY AROM/PROM: Just starting wrist and elbow AROM - did not measure objectively.  No shoulder ROM allowed.   04/04/2023 Left UE AAROM elevation in supine=   LEFT UE AROM elevation in supine: 100  04/11/2023   LEFT ER AROM supine:17   LEFT IR AROM supine:68 30 deg abd      IN SUPINE 04/30/2023 Left shoulder flex AROM in supine;  117 with elbow flexed to initiate   IR AROM  to stomach    ER  AROM 30 degrees at 30 degrees abduction     LYMPHEDEMA ASSESSMENTS:  LANDMARK RIGHT eval  At axilla 30.4  15cm proximal to olecranon 29.4  10 cm proximal to olecranon process 28.3  Olecranon process 24.7  15cm proximal to ulnar styloid 22.1  10 cm proximal to ulnar styloid process 18.7  Just  proximal to ulnar styloid process 16.2  Across hand at thumb web space 19.8  At base of 2nd digit 6.4  (Blank rows = not tested)  LANDMARK LEFT  eval LEFT 03/26/2023 LEFT 04/11/2023 LEFT  04/30/2023 LEFT 05/09/23  At axilla 33 31 29.6 29.7 29.7  15cm proximal to olecranon 32.5 29.4 28.8 28.4 27.8  10 cm proximal to olecranon process 34.5 30.2 28.0 27.6 27.4  Olecranon process 30.5 26.6 24.0 23.4 23.3  15cm proximal to ulnar styloid  28.7 25.6 23.0 23.0 23.3  10 cm proximal to ulnar styloid process 26.5 23.4 20.5 21.1 21.9  Just proximal to ulnar styloid process 18.5 17.2 15.9 15.8 15.9  Across hand at thumb web space 20.4 18.8 19.2 19.0 18.4  At base of 2nd digit 7.0 6.1 5.85 6.0 5.9  (Blank rows = not tested)  Volume (at eval):          Length 7cm Rt:                         Lt:                      04/30/2023  1596 ml difference   PATIENT SURVEYS:  Quick  Dash 68% limited EVAL 04/29/2022     34%   TODAY'S TREATMENT:                                                                                                                                         DATE:   05/21/2023 Pt s/p recent fall last Friday; Xrays negative,treatment modified Pt wearing TG soft on Left UE  Alternating isometrics gentle for IR and ER 3 x 20 AROM IR and ER, elbow flex and ext x 10 ea AA shoulder flexion x 10 PROM left shoulder flex, scaption, abd, IR and ER  AROM flex 50 -90 degrees x 8, x 5 MLD to Lt UE: Short neck, 5 breaths, Rt axillary nodes, anterior inter-axillary anasotmosis, Lt inguinal nodes, Lt axillo-inguinal anastomosis,  and  Left UE working from proximal to distal then retracing all steps and ending with LN's  05/18/2023 Measured length for sleeve 45 cm;requires long Val PTA came in and discussed sleeve info with pt. Awaiting script Tried pulleys but held secondary to difficulty getting it started Scapular retraction yellow band x 10, emphasis on retraction with  depression Long yellow band over door with pt pulling down and then having assist of band to elevate while maintaining scapular depression Alternating isometrics IR/ER 2 x 20, rhythmic stabs at 90 deg flexion 1 x 20 secs Supine alphabet x 1 Supine AROM with elbow straight and raising from approx 30 degrees to 90 degrees with scapular depression x 5 with good control Supine wand for shoulder flexion to improve ROM, and to focus on scapular depression x 5. Updated HEP Finger ladder x 2  Shoulder Ranger with mild A of PT for elevation x 4-5 reps. Pt did a good job keeping her left UT quiet  05/16/23: Therapeutic Exercises Pt performed A/ROM of shoulders in front of mirror for work on decreasing Lt scapular compensation with flex and scaption.  Forearms on wall with yellow theraband for scap retract x 10 each and then with no theraband wall walks in small motion up and down x 5 each with tactile cues to decrease Lt scapular compensation and to keep elbow at side. Handout issued Pulleys into flex x 2 mins and abd x 3 mins each with VC's to decrease scapular compensations and to encourage pt to use a slow pace so she can be more aware of when compensation begins and to stop lifting arm at that point.  Self Care Discussed current compression. Pt reports not wearing velcro compression garment much at all anymore as it has gotten too big with how much her arm has reduced. Pt would really like to get a compression sleeve. Had a demo one at the clinic that pt was able to partially pull on (was too small for her to pull all the way up her arm) and she was able to do this fairly well despite recent total reverse shoulder.  Pt was very happy about this and would like to look into seeing if her insurance will cover this.  Manual Therapy STM: started with pt sitting EOB for STM to Lt UT with cocoa butter for trigger point release, pt with multiple areas of tightness here. Then transitioned to supine for further STM  to Lt pect tendon and lateral trunk during P/ROM P/ROM of Lt shoulder into flex and scaption as pt tolerated and with scapular depression by therapist as able   05/14/23: Therapeutic Exercises Reviewed dowel exercises into flex and abd x 5 reps in front of mirror returning therapist demo Forearms on wall with yellow theraband for scap retract x 5 each and then wall walks in small motion up and down x 5 each with tactile cues to decrease Lt scapular compensation Lt shoulder ext x 10 with yellow theraband over door A/ROM in front of mirror for Lt shoulder into flex and abd multiple times working on decreasing scapular compensation and paying attention to what the compensation feels like and looks like. Pt was able to verbalize good understanding after practicing this. Manual Therapy P/ROM to Lt shoulder in supine into flex, scaption, er and IR with scapular depression throughout by therapist. Pain reported in posterior shoulder at end scaption motions so limited motion here  05/11/2023 Wand shoulder flexion in standing x 4 held due to UT compensation Scapular retraction and shoulder extension with yellow band x 10 ea Sitting scapular PNF retraction and depression with PT resistance 2 x 10 Alternating isometrics IR and ER 2 x 20 sec Supine AROM left at 30 deg to 90 deg  x 10, emphasis on scapular depression Supine alphabet A-Z x 1 Updated HEP MLD to Lt UE: Short neck, superficial and deep abdominals, Rt axillary nodes, Rt intact anterior thorax sequence, anterior inter-axillary anasotmosis, Lt inguinal nodes, Lt axillo-inguinal anastomosis, Lt chest near axilla where pt has some accumulation of fluid, and UE working from proximal to distal then retracing all steps 05/09/23: Manual Therapy MLD to Lt UE: Short neck, superficial and deep abdominals, Rt axillary nodes, Rt intact anterior thorax sequence, anterior inter-axillary anasotmosis, Lt inguinal nodes, Lt axillo-inguinal anastomosis, Lt chest  near axilla where pt has some accumulation of fluid, and UE working from proximal to distal then retracing all steps P/ROM of Lt shoulder to pts tolerance into flex and scaption briefly, pt reports still feeling very sore from her stretches this weekend Therapeutic Exercises Briefly reviewed standing dowel extension and showed pt how she can work on IR with dowel behind back. She reports feeling good stretch with this.   05/07/23 Reviewed garments status about failing velcro and emailed Melissa at [facility] for direction on this x Therapeutic Exercise Discussed standing dowel motion to mimic reach for top of steering wheel Supine flexion AAROM with 1 finger only x 3 and then small circles at 90deg x 5 c/cc no assistance needed Sidelying AROM into abduction x 3 and flexion x 3 as tolerated Manual Therapy MLD to Lt UE superficial and deep abdominals (reviewed with pt 5 diaphragmatic breaths here for home), Rt axillary nodes, anterior inter-axillary anasotmosis, Lt inguinal nodes, Lt axillo-inguinal anastomosis, Lt UE working from proximal to distal then retracing all steps reviewing all with pt throughout P/ROM of Lt shoulder to pts tolerance into flex, abd and er/IR  05/04/23: Manual Therapy MLD to Lt UE reviewing with pt per handout issued at last session: Short neck, superficial and deep abdominals (reviewed with pt 5 diaphragmatic breaths here for  home), Rt axillary nodes, anterior inter-axillary anasotmosis, Lt inguinal nodes, Lt axillo-inguinal anastomosis, Lt UE working from proximal to distal then retracing all steps reviewing all with pt throughout P/ROM of Lt shoulder to pts tolerance into flex, abd and er/IR STM gently to ant and sup shoulder during P/ROM and along incision Assisted pt with donning her velcro compression garment at end of session. Also issued her an extra oversleeve we had to help her velcro stay as the ends of the velcro are starting to unfurl during day. Pt plans to  call compression company about this.       PATIENT EDUCATION:  Access Code: [email or web address] URL: [email or web address] Date: 04/13/2023 Prepared by: Alvira Monday  Exercises - Standing Isometric Shoulder Internal Rotation at Doorway  - 1 x daily - 7 x weekly - 3 sets - 10 reps - Standing Isometric Shoulder External Rotation with Doorway  - 1 x daily - 7 x weekly - 1 sets - 5-10 reps - Isometric Shoulder Flexion at Wall  - 1 x daily - 7 x weekly - 1 sets - 5-10 reps - 5 hold - Isometric Shoulder Extension at Wall  - 1 x daily - 7 x weekly - 1 sets - 5-10 reps - Isometric Shoulder Abduction at Wall  - 1 x daily - 7 x weekly - 1 sets - 5-10 reps   05/02/23: Education details: Forearms on wall for postural strength Person educated: Patient Education method: Programmer, multimedia, Demonstration, and Verbal cues, and tactile cues, handout issued (at 05/16/23 session) Education comprehension: verbalized understanding, returned demonstration, tactile and VC's and pt will benefit from further review  HOME EXERCISE PROGRAM: Scapular retraction, shoulder extension yellow band x 10 Supine alphabet Isometrics for Lt shoulder Standing dowel exercises Self MLD Forearms on wall for scapular strength with yellow theraband  ASSESSMENT:  CLINICAL IMPRESSION: Treatment modified today due to recent fall last Friday shortly after she left therapy. Pain 1-2/10 left shoulder prior to treatment, but improved, and pt felt better after treatment. Pt performed AAROM and AROM in pain free ROM. PROM was perofrmed and pt was tight at all end ranges, but did loosen up.She did have some discomfort trying to get her left arm comfortable in supine, which was improved with a towel rolled under her upper arm.  OBJECTIVE IMPAIRMENTS: decreased activity tolerance, decreased knowledge of condition, decreased knowledge of use of DME, decreased mobility, decreased ROM, and increased edema.   ACTIVITY LIMITATIONS: carrying,  lifting, sleeping, bathing, dressing, and reach over head  PARTICIPATION LIMITATIONS: meal prep, cleaning, laundry, shopping, and community activity  PERSONAL FACTORS: Age and 1 comorbidity: ALND and radiation hx  are also affecting patient's functional outcome.   REHAB POTENTIAL: Excellent  CLINICAL DECISION MAKING: Evolving/moderate complexity  EVALUATION COMPLEXITY: Moderate  GOALS: Goals reviewed with patient? Yes  SHORT TERM GOALS: Target date: 04/08/23    Pt will be ind with self MLD for edema reduction Baseline: Goal status: In progress 2.  Pt will decrease volume to or less to demonstrate improving edema status Baseline:  Goal status: MET 1596  (8 ml more) 04/30/2023 3.  Pt will be evaluated and treated for shoulder with goals added as needed as instructed by MD during future visits.  Baseline:  Goal status:MET 04/04/2023  LONG TERM GOALS: Target date: 05/10/23  Pt will reduce to less than volume difference between sides to demonstrate no active lymphedema Baseline:  Goal status: MET 04/29/2022 (1596 ml) 2.  Pt will obtain compression as needed for  discharge depending on reduction Baseline:  Goal status: MET 04/11/2023   3. Pt will be able to perform AROM  elevation in supine to atleast 120 degrees without compensation  Goal Status; In progress, 117 04/30/2023  4. Pt will be able to place 8 oz object on eye level shelf without UT compensation  GOALStatus: In progress  5. Pt will be tolerant of resisted exs for improving UE strength at 7 weeks post op Goal Status; In progress ;started isometrics 6. Pt will be able to return to driving for greater independence Goal status: new   PLAN:  PT FREQUENCY: 3x/week  PT DURATION:  4 weeks  PLANNED INTERVENTIONS: 97164- PT Re-evaluation, 97110-Therapeutic exercises, 97530- Therapeutic activity, [id number]- Neuromuscular re-education, 97535- Self Care, and 16109- Manual therapy  PLAN FOR NEXT SESSION: Modify  treatment prn due to fall on 05/18/2023.Measure pt for length of Bella Strong (fits into size 3 sleeve, size 2 gauntlet) and assist with ordering through [employer]. Review band exs and new forearms on wall; Review standing dowel exs prn, progress  strength, review and cont MLD prn; see Media section for protocol though physician has released her, next appt with surgeon 05/21/2022. Pt needs new script for flat knit but should wait to be measured until fibrosis further decreases.   Berna Spare, PTA 05/21/23 1:03 PM   05/14/23: Start with forearms on wall holding short yellow theraband in hands:  Pull down keeping elbow bent and squeezing shoulder blade towards spine at end of pull  2 sets of 5 each  2.   Inch forearms up wall keeping elbows in only as high as you can control by keeping the shoulder blade down  2 sets of 5 each  Next:  Throw long band over top of door, close door and pull down with Lt arm, when returning to start position keep shoulder down  Cancer Rehab [phone]

## 2023-05-23 ENCOUNTER — Ambulatory Visit: Payer: Medicare PPO

## 2023-05-23 DIAGNOSIS — Z4789 Encounter for other orthopedic aftercare: Secondary | ICD-10-CM

## 2023-05-23 DIAGNOSIS — M25612 Stiffness of left shoulder, not elsewhere classified: Secondary | ICD-10-CM

## 2023-05-23 DIAGNOSIS — I972 Postmastectomy lymphedema syndrome: Secondary | ICD-10-CM | POA: Diagnosis not present

## 2023-05-23 DIAGNOSIS — Z96612 Presence of left artificial shoulder joint: Secondary | ICD-10-CM

## 2023-05-23 NOTE — Therapy (Signed)
OUTPATIENT PT UPPER EXTREMITY LYMPHEDEMA TREATMENT  Patient Name: Sarah Underwood MRN: [id number] DOB:March 20, 1946, 78 y.o., female Today's Date: 05/23/2023  END OF SESSION:  PT End of Session - 05/23/23 1117     Visit Number 23    Number of Visits 25    Date for PT Re-Evaluation 05/28/23    Authorization Time Period 12 visits approved 05/03/2023-06/01/2023    Authorization - Visit Number 9    Authorization - Number of Visits 12    PT Start Time 1105    PT Stop Time 1200    PT Time Calculation (min) 55 min    Activity Tolerance Patient tolerated treatment well    Behavior During Therapy [facility] for tasks assessed/performed                 Past Medical History:  Diagnosis Date   Basal cell carcinoma 1994   right side of node   Benign positional vertigo    with rolling over    Celiac disease    Dysrhythmia    Generalized anxiety disorder    GERD (gastroesophageal reflux disease)    Graves disease 2007   thyroid nodule   Hard of hearing    Heart murmur    History of breast cancer 1995   left - treated with mastectomy   History of radiation therapy 2001   37 sessions following recurrence of breast cancer in left chest wall   Hypertension    Knee pain, bilateral 12/14/2014   Major depressive disorder in remission    Mild persistent asthma, uncomplicated 06/29/2015   Nocturia    OA (osteoarthritis) of hip 09/08/2015   Ovarian mass    small solid nodule 8x25mm: Right ovary - stable since 2000, follow u/s do not show this.   Renal mass 2021   followed by Dr. Berneice Heinrich, [facility].  Having every 6 month follow up   Seasonal allergies    Stress fracture of metatarsal bone of left foot 12/15/2019   Vegetarian diet    Past Surgical History:  Procedure Laterality Date   APPENDECTOMY     basal cell removal  7/94   right nose   BREAST BIOPSY Right 1987   benign fibroadenoma   breast cancer recurrence Left 01/2000   at site of left chest wall after previous mastectomy with  removal and treatment with radiaton.   CARPAL TUNNEL RELEASE Right 05/08/2013   Procedure: RIGHT CARPAL TUNNEL RELEASE;  Surgeon: Wyn Forster., MD;  Location: [facility];  Service: Orthopedics;  Laterality: Right;   CATARACT EXTRACTION     03/20/14 left eye, 12/15 right eye   COLPOSCOPY W/ BIOPSY / CURETTAGE  03/12/12   benign, but + HR HPV   EYE SURGERY     bilat cataract surgery    MASTECTOMY Left 3/95   Stage IIb 0/14 LN ER/ PR + with mastectomy - Dr. Mardella Layman   MOHS SURGERY     right nare   OVARY SURGERY  1974   lt   REVERSE SHOULDER ARTHROPLASTY Left 02/26/2023   Procedure: REVERSE SHOULDER ARTHROPLASTY;  Surgeon: Yolonda Kida, MD;  Location: [facility] OR;  Service: Orthopedics;  Laterality: Left;   TONSILLECTOMY AND ADENOIDECTOMY  1950   TOTAL HIP ARTHROPLASTY Right 09/08/2015   Procedure: TOTAL HIP ARTHROPLASTY ANTERIOR APPROACH;  Surgeon: Ollen Gross, MD;  Location: [facility] ORS;  Service: Orthopedics;  Laterality: Right;   Patient Active Problem List   Diagnosis Date Noted   Shoulder fracture 02/23/2023  Closed fracture of proximal end of left humerus, unspecified fracture morphology, initial encounter 02/22/2023   Thyroid nodule 02/22/2023   Major depressive disorder in remission 03/29/2020   Generalized anxiety disorder    Benign positional vertigo    Hypertension    Heart murmur    Stress fracture of metatarsal bone of left foot 12/15/2019   History of revision of total replacement of right hip joint 11/26/2017   Leg edema, left 09/26/2016   Post-nasal drainage 03/30/2016   OA (osteoarthritis) of hip 09/08/2015   Mild persistent asthma, uncomplicated 06/29/2015   Hip pain, bilateral 12/14/2014   Knee pain, bilateral 12/14/2014   Postmenopausal atrophic vaginitis 02/23/2013   Graves disease 2007   History of radiation therapy 2001   History of breast cancer 1995   Basal cell carcinoma 1994    PCP: Alysia Penna, MD  REFERRING PROVIDER:  Duwayne Heck, MD  REFERRING DIAG:  Diagnosis  C50.511,Z17.0 (ICD-10-CM) - Malignant neoplasm of lower-outer quadrant of right breast of female, estrogen receptor positive (HCC)  I89.0 (ICD-10-CM) - Lymphedema  Z47.89 orthopedic aftercare  S/P reverse total shoulder arthroplasty, left  Encounter for other orthopedic aftercare  Stiffness of left shoulder, not elsewhere classified  THERAPY DIAG:  Postmastectomy lymphedema  S/P reverse total shoulder arthroplasty, left  Encounter for other orthopedic aftercare  Stiffness of left shoulder, not elsewhere classified  ONSET DATE: 02/26/23  Rationale for Evaluation and Treatment: Rehabilitation  SUBJECTIVE                                                                                                                                                                                           SUBJECTIVE STATEMENT:  I saw Dr. Aundria Rud yesterday and he is so pleased with my progress. He said we can start working on my reach behind my back (IR) as I can tolerate and to cont PT in general for now. Not having any increased pain since my fall.    PERTINENT HISTORY: Recent fall with left arm fracture.  Surgery with Reverse TSR 02/26/23.  This is the same side as her mastectomy which was diagnosed in 1995 with chest wall recurrence in 2001.  No remaining lymph nodes.   Shoulder surgery: Dr. Duwayne Heck -See media section for post-op approved exercises and okay for compression therapy.    PAIN:  Are you having pain?  PAIN:  Are you having pain? Yesif I move in certain directions NPRS scale: 1-2/10 at best but reaches 1/10 with certain motions of my shoulder, left thumb 2/10 Pain location: left shoulder Pain orientation: Left  PAIN TYPE: aching Pain description: intermittent  Aggravating factors: reaching  in certain directions, bed mobility, but overall feeling much better Relieving factors: rest, relaxing muscles, stretches,  massage   PRECAUTIONS: cleared for all ROM/activity of the shoulder per MD  RED FLAGS: None   WEIGHT BEARING RESTRICTIONS: No  FALLS:  Has patient fallen in last 6 months? Yes. Number of falls 1 - does not remember details of her fall. Just remembers she was going outside.   LIVING ENVIRONMENT: Lives with: lives alone Has a CNA for assistance at home.   OCCUPATION: Retired  LEISURE: Not currently exercising  HAND DOMINANCE : Right  PRIOR LEVEL OF FUNCTION: Independent  PATIENT GOALS: prevent chronic lymphedema after this fall   OBJECTIVE (FROM EVAL) Note: Objective measures were completed at Evaluation unless otherwise noted.  COGNITION:  Overall cognitive status: Within functional limits for tasks assessed   PALPATION: +2 pitting edema back of hand, forearm, below, to around the distal 1/3rd of the upper arm.   OBSERVATIONS / OTHER ASSESSMENTS: bruising all fingers and hand, elbow, and upper arm from surgery.  Skin tight and shiny from edema.     POSTURE: wearing sling upon arrival and leaving  UPPER EXTREMITY AROM/PROM: Just starting wrist and elbow AROM - did not measure objectively.  No shoulder ROM allowed.   04/04/2023 Left UE AAROM elevation in supine=   LEFT UE AROM elevation in supine: 100  04/11/2023   LEFT ER AROM supine:17   LEFT IR AROM supine:68 30 deg abd      IN SUPINE 04/30/2023 Left shoulder flex AROM in supine;  117 with elbow flexed to initiate   IR AROM  to stomach    ER  AROM 30 degrees at 30 degrees abduction     LYMPHEDEMA ASSESSMENTS:  LANDMARK RIGHT eval  At axilla 30.4  15cm proximal to olecranon 29.4  10 cm proximal to olecranon process 28.3  Olecranon process 24.7  15cm proximal to ulnar styloid 22.1  10 cm proximal to ulnar styloid process 18.7  Just proximal to ulnar styloid process 16.2  Across hand at thumb web space 19.8  At base of 2nd digit 6.4  (Blank rows = not tested)  LANDMARK LEFT  eval LEFT 03/26/2023  LEFT 04/11/2023 LEFT  04/30/2023 LEFT 05/09/23 Left 05/23/23  At axilla 33 31 29.6 29.7 29.7 30.3  15cm proximal to olecranon 32.5 29.4 28.8 28.4 27.8 27.8  10 cm proximal to olecranon process 34.5 30.2 28.0 27.6 27.4 27.4  Olecranon process 30.5 26.6 24.0 23.4 23.3 22.9  15cm proximal to ulnar styloid  28.7 25.6 23.0 23.0 23.3 22.9  10 cm proximal to ulnar styloid process 26.5 23.4 20.5 21.1 21.9 22  Just proximal to ulnar styloid process 18.5 17.2 15.9 15.8 15.9 15.7  Across hand at thumb web space 20.4 18.8 19.2 19.0 18.4 18.8  At base of 2nd digit 7.0 6.1 5.85 6.0 5.9 5.9  (Blank rows = not tested)  Volume (at eval):          Length 7cm Rt:                         Lt:                      04/30/2023  1596 ml difference   PATIENT SURVEYS:  Quick Dash 68% limited EVAL 04/29/2022     34%   TODAY'S TREATMENT:  DATE:  05/23/23: Therapeutic Exercises Pulleys into flex and scaption x 2 mins each UE Ranger on wall x 15 into flex, trial of scaption x 3 reps but pt felt unbalanced so stopped scaption Finger ladder for LT UE: Flex up to # 28 x 6 reps, and then Scaption up to # 26-27 x 5 reps. Abd x 3 reps up to # 20. Pt with excellent technique UBE Level 1, 1 min each direction x 2 mins total with PTA assessing technique during; starting explaining "low and slow" progression for when pt gets back to exercising at her gym. Also that she should exercises wearing her compression.  Yellow theraband for Lt ext, flex, and punch (flex with elbow bent) x 5-7 each returning therapist demo Manual Therapy P/ROM briefly at end of session to Lt shoulder into flex, and and er/IR x 5 mins as time allowed   05/21/2023 Pt s/p recent fall last Friday; Xrays negative,treatment modified Pt wearing TG soft on Left UE  Alternating isometrics gentle for IR and  ER 3 x 20 AROM IR and ER, elbow flex and ext x 10 ea AA shoulder flexion x 10 PROM left shoulder flex, scaption, abd, IR and ER  AROM flex 50 -90 degrees x 8, x 5 MLD to Lt UE: Short neck, 5 breaths, Rt axillary nodes, anterior inter-axillary anasotmosis, Lt inguinal nodes, Lt axillo-inguinal anastomosis,  and  Left UE working from proximal to distal then retracing all steps and ending with LN's  05/18/2023 Measured length for sleeve 45 cm;requires long Val PTA came in and discussed sleeve info with pt. Awaiting script Tried pulleys but held secondary to difficulty getting it started Scapular retraction yellow band x 10, emphasis on retraction with depression Long yellow band over door with pt pulling down and then having assist of band to elevate while maintaining scapular depression Alternating isometrics IR/ER 2 x 20, rhythmic stabs at 90 deg flexion 1 x 20 secs Supine alphabet x 1 Supine AROM with elbow straight and raising from approx 30 degrees to 90 degrees with scapular depression x 5 with good control Supine wand for shoulder flexion to improve ROM, and to focus on scapular depression x 5. Updated HEP Finger ladder x 2  Shoulder Ranger with mild A of PT for elevation x 4-5 reps. Pt did a good job keeping her left UT quiet  05/16/23: Therapeutic Exercises Pt performed A/ROM of shoulders in front of mirror for work on decreasing Lt scapular compensation with flex and scaption.  Forearms on wall with yellow theraband for scap retract x 10 each and then with no theraband wall walks in small motion up and down x 5 each with tactile cues to decrease Lt scapular compensation and to keep elbow at side. Handout issued Pulleys into flex x 2 mins and abd x 3 mins each with VC's to decrease scapular compensations and to encourage pt to use a slow pace so she can be more aware of when compensation begins and to stop lifting arm at that point.  Self Care Discussed current compression. Pt reports  not wearing velcro compression garment much at all anymore as it has gotten too big with how much her arm has reduced. Pt would really like to get a compression sleeve. Had a demo one at the clinic that pt was able to partially pull on (was too small for her to pull all the way up her arm) and she was able to do this fairly well despite recent total reverse  shoulder. Pt was very happy about this and would like to look into seeing if her insurance will cover this.  Manual Therapy STM: started with pt sitting EOB for STM to Lt UT with cocoa butter for trigger point release, pt with multiple areas of tightness here. Then transitioned to supine for further STM to Lt pect tendon and lateral trunk during P/ROM P/ROM of Lt shoulder into flex and scaption as pt tolerated and with scapular depression by therapist as able   05/14/23: Therapeutic Exercises Reviewed dowel exercises into flex and abd x 5 reps in front of mirror returning therapist demo Forearms on wall with yellow theraband for scap retract x 5 each and then wall walks in small motion up and down x 5 each with tactile cues to decrease Lt scapular compensation Lt shoulder ext x 10 with yellow theraband over door A/ROM in front of mirror for Lt shoulder into flex and abd multiple times working on decreasing scapular compensation and paying attention to what the compensation feels like and looks like. Pt was able to verbalize good understanding after practicing this. Manual Therapy P/ROM to Lt shoulder in supine into flex, scaption, er and IR with scapular depression throughout by therapist. Pain reported in posterior shoulder at end scaption motions so limited motion here  05/11/2023 Wand shoulder flexion in standing x 4 held due to UT compensation Scapular retraction and shoulder extension with yellow band x 10 ea Sitting scapular PNF retraction and depression with PT resistance 2 x 10 Alternating isometrics IR and ER 2 x 20 sec Supine AROM left  at 30 deg to 90 deg  x 10, emphasis on scapular depression Supine alphabet A-Z x 1 Updated HEP MLD to Lt UE: Short neck, superficial and deep abdominals, Rt axillary nodes, Rt intact anterior thorax sequence, anterior inter-axillary anasotmosis, Lt inguinal nodes, Lt axillo-inguinal anastomosis, Lt chest near axilla where pt has some accumulation of fluid, and UE working from proximal to distal then retracing all steps 05/09/23: Manual Therapy MLD to Lt UE: Short neck, superficial and deep abdominals, Rt axillary nodes, Rt intact anterior thorax sequence, anterior inter-axillary anasotmosis, Lt inguinal nodes, Lt axillo-inguinal anastomosis, Lt chest near axilla where pt has some accumulation of fluid, and UE working from proximal to distal then retracing all steps P/ROM of Lt shoulder to pts tolerance into flex and scaption briefly, pt reports still feeling very sore from her stretches this weekend Therapeutic Exercises Briefly reviewed standing dowel extension and showed pt how she can work on IR with dowel behind back. She reports feeling good stretch with this.   05/07/23 Reviewed garments status about failing velcro and emailed Melissa at [facility] for direction on this x Therapeutic Exercise Discussed standing dowel motion to mimic reach for top of steering wheel Supine flexion AAROM with 1 finger only x 3 and then small circles at 90deg x 5 c/cc no assistance needed Sidelying AROM into abduction x 3 and flexion x 3 as tolerated Manual Therapy MLD to Lt UE superficial and deep abdominals (reviewed with pt 5 diaphragmatic breaths here for home), Rt axillary nodes, anterior inter-axillary anasotmosis, Lt inguinal nodes, Lt axillo-inguinal anastomosis, Lt UE working from proximal to distal then retracing all steps reviewing all with pt throughout P/ROM of Lt shoulder to pts tolerance into flex, abd and er/IR  05/04/23: Manual Therapy MLD to Lt UE reviewing with pt per handout issued at last  session: Short neck, superficial and deep abdominals (reviewed with pt 5 diaphragmatic breaths here  for home), Rt axillary nodes, anterior inter-axillary anasotmosis, Lt inguinal nodes, Lt axillo-inguinal anastomosis, Lt UE working from proximal to distal then retracing all steps reviewing all with pt throughout P/ROM of Lt shoulder to pts tolerance into flex, abd and er/IR STM gently to ant and sup shoulder during P/ROM and along incision Assisted pt with donning her velcro compression garment at end of session. Also issued her an extra oversleeve we had to help her velcro stay as the ends of the velcro are starting to unfurl during day. Pt plans to call compression company about this.       PATIENT EDUCATION:  Access Code: [email or web address] URL: [email or web address] Date: 04/13/2023 Prepared by: Alvira Monday  Exercises - Standing Isometric Shoulder Internal Rotation at Doorway  - 1 x daily - 7 x weekly - 3 sets - 10 reps - Standing Isometric Shoulder External Rotation with Doorway  - 1 x daily - 7 x weekly - 1 sets - 5-10 reps - Isometric Shoulder Flexion at Wall  - 1 x daily - 7 x weekly - 1 sets - 5-10 reps - 5 hold - Isometric Shoulder Extension at Wall  - 1 x daily - 7 x weekly - 1 sets - 5-10 reps - Isometric Shoulder Abduction at Wall  - 1 x daily - 7 x weekly - 1 sets - 5-10 reps   05/02/23: Education details: Forearms on wall for postural strength Person educated: Patient Education method: Programmer, multimedia, Demonstration, and Verbal cues, and tactile cues, handout issued (at 05/16/23 session) Education comprehension: verbalized understanding, returned demonstration, tactile and VC's and pt will benefit from further review  HOME EXERCISE PROGRAM: Scapular retraction, shoulder extension yellow band x 10 Supine alphabet Isometrics for Lt shoulder Standing dowel exercises Self MLD Forearms on wall for scapular strength with yellow theraband  ASSESSMENT:  CLINICAL  IMPRESSION: Pt was feeling fine since fall last week. She got a great report from Dr. Aundria Rud and so we progressed some exercises today adding more Lt scapular and shoulder strength. Pt reports would like to start going back to her gym soon so began some gym exercises today as well and began educating her about "low and slow" progression of resistance and reps along with importance of wearing compression garment when she exercises. Pt verbalized good understanding.   OBJECTIVE IMPAIRMENTS: decreased activity tolerance, decreased knowledge of condition, decreased knowledge of use of DME, decreased mobility, decreased ROM, and increased edema.   ACTIVITY LIMITATIONS: carrying, lifting, sleeping, bathing, dressing, and reach over head  PARTICIPATION LIMITATIONS: meal prep, cleaning, laundry, shopping, and community activity  PERSONAL FACTORS: Age and 1 comorbidity: ALND and radiation hx  are also affecting patient's functional outcome.   REHAB POTENTIAL: Excellent  CLINICAL DECISION MAKING: Evolving/moderate complexity  EVALUATION COMPLEXITY: Moderate  GOALS: Goals reviewed with patient? Yes  SHORT TERM GOALS: Target date: 04/08/23    Pt will be ind with self MLD for edema reduction Baseline: Goal status: In progress 2.  Pt will decrease volume to or less to demonstrate improving edema status Baseline:  Goal status: MET 1596  (8 ml more) 04/30/2023 3.  Pt will be evaluated and treated for shoulder with goals added as needed as instructed by MD during future visits.  Baseline:  Goal status:MET 04/04/2023  LONG TERM GOALS: Target date: 05/10/23  Pt will reduce to less than volume difference between sides to demonstrate no active lymphedema Baseline:  Goal status: MET 04/29/2022 (1596 ml) 2.  Pt will obtain compression as needed  for discharge depending on reduction Baseline:  Goal status: MET 04/11/2023   3. Pt will be able to perform AROM  elevation in supine to atleast 120  degrees without compensation  Goal Status; In progress, 117 04/30/2023  4. Pt will be able to place 8 oz object on eye level shelf without UT compensation  GOALStatus: In progress  5. Pt will be tolerant of resisted exs for improving UE strength at 7 weeks post op Goal Status; In progress ;started isometrics 6. Pt will be able to return to driving for greater independence Goal status: new   PLAN:  PT FREQUENCY: 3x/week  PT DURATION:  4 weeks  PLANNED INTERVENTIONS: 97164- PT Re-evaluation, 97110-Therapeutic exercises, 97530- Therapeutic activity, 97112- Neuromuscular re-education, 97535- Self Care, and 19147- Manual therapy  PLAN FOR NEXT SESSION:  Review band exs and new forearms on wall; Review standing dowel exs prn, progress  strength, review and cont MLD prn; see Media section for protocol though physician has released her, next appt with surgeon 05/21/2022. Pt needs new script for flat knit but should wait to be measured until fibrosis further decreases.   Berna Spare, PTA 05/23/23 3:05 PM   05/14/23: Start with forearms on wall holding short yellow theraband in hands:  Pull down keeping elbow bent and squeezing shoulder blade towards spine at end of pull  2 sets of 5 each  2.   Inch forearms up wall keeping elbows in only as high as you can control by keeping the shoulder blade down  2 sets of 5 each  Next:  Throw long band over top of door, close door and pull down with Lt arm, when returning to start position keep shoulder down  Cancer Rehab [phone]

## 2023-05-28 ENCOUNTER — Ambulatory Visit: Payer: Medicare PPO | Attending: Orthopedic Surgery

## 2023-05-28 DIAGNOSIS — Z4789 Encounter for other orthopedic aftercare: Secondary | ICD-10-CM | POA: Diagnosis not present

## 2023-05-28 DIAGNOSIS — M25612 Stiffness of left shoulder, not elsewhere classified: Secondary | ICD-10-CM | POA: Diagnosis not present

## 2023-05-28 DIAGNOSIS — Z96612 Presence of left artificial shoulder joint: Secondary | ICD-10-CM

## 2023-05-28 DIAGNOSIS — I972 Postmastectomy lymphedema syndrome: Secondary | ICD-10-CM | POA: Diagnosis not present

## 2023-05-28 NOTE — Therapy (Signed)
OUTPATIENT PT UPPER EXTREMITY LYMPHEDEMA TREATMENT  Patient Name: Sarah Underwood MRN: [id number] DOB:08/20/1945, 78 y.o., female Today's Date: 05/28/2023  END OF SESSION:  PT End of Session - 05/28/23 1105     Visit Number 24    Number of Visits 25    Date for PT Re-Evaluation 05/28/23    Authorization Type [employer] Auth needed    Authorization Time Period 12 visits approved 05/03/2023-06/01/2023    Authorization - Visit Number 10    Authorization - Number of Visits 12    PT Start Time 1102    PT Stop Time 1200    PT Time Calculation (min) 58 min    Activity Tolerance Patient tolerated treatment well    Behavior During Therapy WFL for tasks assessed/performed                 Past Medical History:  Diagnosis Date   Basal cell carcinoma 1994   right side of node   Benign positional vertigo    with rolling over    Celiac disease    Dysrhythmia    Generalized anxiety disorder    GERD (gastroesophageal reflux disease)    Graves disease 2007   thyroid nodule   Hard of hearing    Heart murmur    History of breast cancer 1995   left - treated with mastectomy   History of radiation therapy 2001   37 sessions following recurrence of breast cancer in left chest wall   Hypertension    Knee pain, bilateral 12/14/2014   Major depressive disorder in remission    Mild persistent asthma, uncomplicated 06/29/2015   Nocturia    OA (osteoarthritis) of hip 09/08/2015   Ovarian mass    small solid nodule 8x13mm: Right ovary - stable since 2000, follow u/s do not show this.   Renal mass 2021   followed by Dr. Berneice Heinrich, [facility].  Having every 6 month follow up   Seasonal allergies    Stress fracture of metatarsal bone of left foot 12/15/2019   Vegetarian diet    Past Surgical History:  Procedure Laterality Date   APPENDECTOMY     basal cell removal  7/94   right nose   BREAST BIOPSY Right 1987   benign fibroadenoma   breast cancer recurrence Left 01/2000   at site of left chest  wall after previous mastectomy with removal and treatment with radiaton.   CARPAL TUNNEL RELEASE Right 05/08/2013   Procedure: RIGHT CARPAL TUNNEL RELEASE;  Surgeon: Wyn Forster., MD;  Location: [facility];  Service: Orthopedics;  Laterality: Right;   CATARACT EXTRACTION     03/20/14 left eye, 12/15 right eye   COLPOSCOPY W/ BIOPSY / CURETTAGE  03/12/12   benign, but + HR HPV   EYE SURGERY     bilat cataract surgery    MASTECTOMY Left 3/95   Stage IIb 0/14 LN ER/ PR + with mastectomy - Dr. Mardella Layman   MOHS SURGERY     right nare   OVARY SURGERY  1974   lt   REVERSE SHOULDER ARTHROPLASTY Left 02/26/2023   Procedure: REVERSE SHOULDER ARTHROPLASTY;  Surgeon: Yolonda Kida, MD;  Location: [facility] OR;  Service: Orthopedics;  Laterality: Left;   TONSILLECTOMY AND ADENOIDECTOMY  1950   TOTAL HIP ARTHROPLASTY Right 09/08/2015   Procedure: TOTAL HIP ARTHROPLASTY ANTERIOR APPROACH;  Surgeon: Ollen Gross, MD;  Location: [facility];  Service: Orthopedics;  Laterality: Right;   Patient Active Problem List  Diagnosis Date Noted   Shoulder fracture 02/23/2023   Closed fracture of proximal end of left humerus, unspecified fracture morphology, initial encounter 02/22/2023   Thyroid nodule 02/22/2023   Major depressive disorder in remission 03/29/2020   Generalized anxiety disorder    Benign positional vertigo    Hypertension    Heart murmur    Stress fracture of metatarsal bone of left foot 12/15/2019   History of revision of total replacement of right hip joint 11/26/2017   Leg edema, left 09/26/2016   Post-nasal drainage 03/30/2016   OA (osteoarthritis) of hip 09/08/2015   Mild persistent asthma, uncomplicated 06/29/2015   Hip pain, bilateral 12/14/2014   Knee pain, bilateral 12/14/2014   Postmenopausal atrophic vaginitis 02/23/2013   Graves disease 2007   History of radiation therapy 2001   History of breast cancer 1995   Basal cell carcinoma 1994    PCP: Alysia Penna, MD  REFERRING PROVIDER: Duwayne Heck, MD  REFERRING DIAG:  Diagnosis  C50.511,Z17.0 (ICD-10-CM) - Malignant neoplasm of lower-outer quadrant of right breast of female, estrogen receptor positive (HCC)  I89.0 (ICD-10-CM) - Lymphedema  Z47.89 orthopedic aftercare  S/P reverse total shoulder arthroplasty, left  Encounter for other orthopedic aftercare  Stiffness of left shoulder, not elsewhere classified  THERAPY DIAG:  Postmastectomy lymphedema  S/P reverse total shoulder arthroplasty, left  Encounter for other orthopedic aftercare  Stiffness of left shoulder, not elsewhere classified  ONSET DATE: 02/26/23  Rationale for Evaluation and Treatment: Rehabilitation  SUBJECTIVE                                                                                                                                                                                           SUBJECTIVE STATEMENT:  I've really been working on reaching behind my back so my Lt upper arm and chest has been more tight just from that new stretch (IR). I did notice a bit more swelling after our last workout like you said. So I wore my velcro compression more over the weekend and that started to help reduce the fluid. It did swell again above my elbow so I slept in the TG soft because that seems to reduce that.  PERTINENT HISTORY: Recent fall with left arm fracture.  Surgery with Reverse TSR 02/26/23.  This is the same side as her mastectomy which was diagnosed in 1995 with chest wall recurrence in 2001.  No remaining lymph nodes.   Shoulder surgery: Dr. Duwayne Heck -See media section for post-op approved exercises and okay for compression therapy.    PAIN:  Are you having pain?  PAIN:  Are you having pain? No,  just a lot of muscle soreness  from stretching   PRECAUTIONS: cleared for all ROM/activity of the shoulder per MD  RED FLAGS: None   WEIGHT BEARING RESTRICTIONS: No  FALLS:  Has patient fallen  in last 6 months? Yes. Number of falls 1 - does not remember details of her fall. Just remembers she was going outside.   LIVING ENVIRONMENT: Lives with: lives alone Has a CNA for assistance at home.   OCCUPATION: Retired  LEISURE: Not currently exercising  HAND DOMINANCE : Right  PRIOR LEVEL OF FUNCTION: Independent  PATIENT GOALS: prevent chronic lymphedema after this fall   OBJECTIVE (FROM EVAL) Note: Objective measures were completed at Evaluation unless otherwise noted.  COGNITION:  Overall cognitive status: Within functional limits for tasks assessed   PALPATION: +2 pitting edema back of hand, forearm, below, to around the distal 1/3rd of the upper arm.   OBSERVATIONS / OTHER ASSESSMENTS: bruising all fingers and hand, elbow, and upper arm from surgery.  Skin tight and shiny from edema.     POSTURE: wearing sling upon arrival and leaving  UPPER EXTREMITY AROM/PROM: Just starting wrist and elbow AROM - did not measure objectively.  No shoulder ROM allowed.   04/04/2023 Left UE AAROM elevation in supine=   LEFT UE AROM elevation in supine: 100  04/11/2023   LEFT ER AROM supine:17   LEFT IR AROM supine:68 30 deg abd      IN SUPINE 04/30/2023 Left shoulder flex AROM in supine;  117 with elbow flexed to initiate   IR AROM  to stomach    ER  AROM 30 degrees at 30 degrees abduction     LYMPHEDEMA ASSESSMENTS:  LANDMARK RIGHT eval  At axilla 30.4  15cm proximal to olecranon 29.4  10 cm proximal to olecranon process 28.3  Olecranon process 24.7  15cm proximal to ulnar styloid 22.1  10 cm proximal to ulnar styloid process 18.7  Just proximal to ulnar styloid process 16.2  Across hand at thumb web space 19.8  At base of 2nd digit 6.4  (Blank rows = not tested)  LANDMARK LEFT  eval LEFT 03/26/2023 LEFT 04/11/2023 LEFT  04/30/2023 LEFT 05/09/23 Left 05/23/23  At axilla 33 31 29.6 29.7 29.7 30.3  15cm proximal to olecranon 32.5 29.4 28.8 28.4 27.8 27.8  10 cm  proximal to olecranon process 34.5 30.2 28.0 27.6 27.4 27.4  Olecranon process 30.5 26.6 24.0 23.4 23.3 22.9  15cm proximal to ulnar styloid  28.7 25.6 23.0 23.0 23.3 22.9  10 cm proximal to ulnar styloid process 26.5 23.4 20.5 21.1 21.9 22  Just proximal to ulnar styloid process 18.5 17.2 15.9 15.8 15.9 15.7  Across hand at thumb web space 20.4 18.8 19.2 19.0 18.4 18.8  At base of 2nd digit 7.0 6.1 5.85 6.0 5.9 5.9  (Blank rows = not tested)  Volume (at eval):          Length 7cm Rt:                         Lt:                      04/30/2023  1596 ml difference   PATIENT SURVEYS:  Quick Dash 68% limited EVAL 04/29/2022     34%   TODAY'S TREATMENT:  DATE:  05/28/23: Manual Therapy MLD to Lt UE as follows: Short neck, superficial and deep abdominals, Rt axillary nodes, anterior inter-axillary anasotmosis, Lt inguinal nodes, Lt axillo-inguinal anastomosis, Lt UE working from proximal to distal  down to forearm but spent more time focusing on antecubital fossa and distal upper arm where most fibrosis, then retracing all steps P/ROM of Lt shoulder into flex, abd and er/IR all to tolerance with scapular depression by therapist STM in Rt S/L with cocoa butter to medial scapular border and UT and briefly to upper arm when back in supine  05/23/23: Therapeutic Exercises Pulleys into flex and scaption x 2 mins each UE Ranger on wall x 15 into flex, trial of scaption x 3 reps but pt felt unbalanced so stopped scaption Finger ladder for LT UE: Flex up to # 28 x 6 reps, and then Scaption up to # 26-27 x 5 reps. Abd x 3 reps up to # 20. Pt with excellent technique UBE Level 1, 1 min each direction x 2 mins total with PTA assessing technique during; starting explaining "low and slow" progression for when pt gets back to exercising at her gym. Also  that she should exercises wearing her compression.  Yellow theraband for Lt ext, flex, and punch (flex with elbow bent) x 5-7 each returning therapist demo Manual Therapy P/ROM briefly at end of session to Lt shoulder into flex, and and er/IR x 5 mins as time allowed   05/21/2023 Pt s/p recent fall last Friday; Xrays negative,treatment modified Pt wearing TG soft on Left UE  Alternating isometrics gentle for IR and ER 3 x 20 AROM IR and ER, elbow flex and ext x 10 ea AA shoulder flexion x 10 PROM left shoulder flex, scaption, abd, IR and ER  AROM flex 50 -90 degrees x 8, x 5 MLD to Lt UE: Short neck, 5 breaths, Rt axillary nodes, anterior inter-axillary anasotmosis, Lt inguinal nodes, Lt axillo-inguinal anastomosis,  and  Left UE working from proximal to distal then retracing all steps and ending with LN's  05/18/2023 Measured length for sleeve 45 cm;requires long Val PTA came in and discussed sleeve info with pt. Awaiting script Tried pulleys but held secondary to difficulty getting it started Scapular retraction yellow band x 10, emphasis on retraction with depression Long yellow band over door with pt pulling down and then having assist of band to elevate while maintaining scapular depression Alternating isometrics IR/ER 2 x 20, rhythmic stabs at 90 deg flexion 1 x 20 secs Supine alphabet x 1 Supine AROM with elbow straight and raising from approx 30 degrees to 90 degrees with scapular depression x 5 with good control Supine wand for shoulder flexion to improve ROM, and to focus on scapular depression x 5. Updated HEP Finger ladder x 2  Shoulder Ranger with mild A of PT for elevation x 4-5 reps. Pt did a good job keeping her left UT quiet  05/16/23: Therapeutic Exercises Pt performed A/ROM of shoulders in front of mirror for work on decreasing Lt scapular compensation with flex and scaption.  Forearms on wall with yellow theraband for scap retract x 10 each and then with no theraband  wall walks in small motion up and down x 5 each with tactile cues to decrease Lt scapular compensation and to keep elbow at side. Handout issued Pulleys into flex x 2 mins and abd x 3 mins each with VC's to decrease scapular compensations and to encourage pt to use a slow pace so  she can be more aware of when compensation begins and to stop lifting arm at that point.  Self Care Discussed current compression. Pt reports not wearing velcro compression garment much at all anymore as it has gotten too big with how much her arm has reduced. Pt would really like to get a compression sleeve. Had a demo one at the clinic that pt was able to partially pull on (was too small for her to pull all the way up her arm) and she was able to do this fairly well despite recent total reverse shoulder. Pt was very happy about this and would like to look into seeing if her insurance will cover this.  Manual Therapy STM: started with pt sitting EOB for STM to Lt UT with cocoa butter for trigger point release, pt with multiple areas of tightness here. Then transitioned to supine for further STM to Lt pect tendon and lateral trunk during P/ROM P/ROM of Lt shoulder into flex and scaption as pt tolerated and with scapular depression by therapist as able   05/14/23: Therapeutic Exercises Reviewed dowel exercises into flex and abd x 5 reps in front of mirror returning therapist demo Forearms on wall with yellow theraband for scap retract x 5 each and then wall walks in small motion up and down x 5 each with tactile cues to decrease Lt scapular compensation Lt shoulder ext x 10 with yellow theraband over door A/ROM in front of mirror for Lt shoulder into flex and abd multiple times working on decreasing scapular compensation and paying attention to what the compensation feels like and looks like. Pt was able to verbalize good understanding after practicing this. Manual Therapy P/ROM to Lt shoulder in supine into flex, scaption, er  and IR with scapular depression throughout by therapist. Pain reported in posterior shoulder at end scaption motions so limited motion here  05/11/2023 Wand shoulder flexion in standing x 4 held due to UT compensation Scapular retraction and shoulder extension with yellow band x 10 ea Sitting scapular PNF retraction and depression with PT resistance 2 x 10 Alternating isometrics IR and ER 2 x 20 sec Supine AROM left at 30 deg to 90 deg  x 10, emphasis on scapular depression Supine alphabet A-Z x 1 Updated HEP MLD to Lt UE: Short neck, superficial and deep abdominals, Rt axillary nodes, Rt intact anterior thorax sequence, anterior inter-axillary anasotmosis, Lt inguinal nodes, Lt axillo-inguinal anastomosis, Lt chest near axilla where pt has some accumulation of fluid, and UE working from proximal to distal then retracing all steps 05/09/23: Manual Therapy MLD to Lt UE: Short neck, superficial and deep abdominals, Rt axillary nodes, Rt intact anterior thorax sequence, anterior inter-axillary anasotmosis, Lt inguinal nodes, Lt axillo-inguinal anastomosis, Lt chest near axilla where pt has some accumulation of fluid, and UE working from proximal to distal then retracing all steps P/ROM of Lt shoulder to pts tolerance into flex and scaption briefly, pt reports still feeling very sore from her stretches this weekend Therapeutic Exercises Briefly reviewed standing dowel extension and showed pt how she can work on IR with dowel behind back. She reports feeling good stretch with this.   05/07/23 Reviewed garments status about failing velcro and emailed Melissa at [facility] for direction on this x Therapeutic Exercise Discussed standing dowel motion to mimic reach for top of steering wheel Supine flexion AAROM with 1 finger only x 3 and then small circles at 90deg x 5 c/cc no assistance needed Sidelying AROM into abduction x  3 and flexion x 3 as tolerated Manual Therapy MLD to Lt UE superficial and  deep abdominals (reviewed with pt 5 diaphragmatic breaths here for home), Rt axillary nodes, anterior inter-axillary anasotmosis, Lt inguinal nodes, Lt axillo-inguinal anastomosis, Lt UE working from proximal to distal then retracing all steps reviewing all with pt throughout P/ROM of Lt shoulder to pts tolerance into flex, abd and er/IR  05/04/23: Manual Therapy MLD to Lt UE reviewing with pt per handout issued at last session: Short neck, superficial and deep abdominals (reviewed with pt 5 diaphragmatic breaths here for home), Rt axillary nodes, anterior inter-axillary anasotmosis, Lt inguinal nodes, Lt axillo-inguinal anastomosis, Lt UE working from proximal to distal then retracing all steps reviewing all with pt throughout P/ROM of Lt shoulder to pts tolerance into flex, abd and er/IR STM gently to ant and sup shoulder during P/ROM and along incision Assisted pt with donning her velcro compression garment at end of session. Also issued her an extra oversleeve we had to help her velcro stay as the ends of the velcro are starting to unfurl during day. Pt plans to call compression company about this.       PATIENT EDUCATION:  Access Code: [email or web address] URL: [email or web address] Date: 04/13/2023 Prepared by: Alvira Monday  Exercises - Standing Isometric Shoulder Internal Rotation at Doorway  - 1 x daily - 7 x weekly - 3 sets - 10 reps - Standing Isometric Shoulder External Rotation with Doorway  - 1 x daily - 7 x weekly - 1 sets - 5-10 reps - Isometric Shoulder Flexion at Wall  - 1 x daily - 7 x weekly - 1 sets - 5-10 reps - 5 hold - Isometric Shoulder Extension at Wall  - 1 x daily - 7 x weekly - 1 sets - 5-10 reps - Isometric Shoulder Abduction at Wall  - 1 x daily - 7 x weekly - 1 sets - 5-10 reps   05/02/23: Education details: Forearms on wall for postural strength Person educated: Patient Education method: Programmer, multimedia, Demonstration, and Verbal cues, and tactile cues,  handout issued (at 05/16/23 session) Education comprehension: verbalized understanding, returned demonstration, tactile and VC's and pt will benefit from further review  HOME EXERCISE PROGRAM: Scapular retraction, shoulder extension yellow band x 10 Supine alphabet Isometrics for Lt shoulder Standing dowel exercises Self MLD Forearms on wall for scapular strength with yellow theraband  ASSESSMENT:  CLINICAL IMPRESSION: Pt has been working hard on ir which has caused more soreness at Lt chest and upper arm. Also she reports had increased lymphedema after last session so wore her velcro compression garment all weekend but still has some residual fluid so wanted therapy focused on lessening muscle soreness so pt can cont with HEP stretches and lymphedema was visibly better by end of session at posterior upper arm.   OBJECTIVE IMPAIRMENTS: decreased activity tolerance, decreased knowledge of condition, decreased knowledge of use of DME, decreased mobility, decreased ROM, and increased edema.   ACTIVITY LIMITATIONS: carrying, lifting, sleeping, bathing, dressing, and reach over head  PARTICIPATION LIMITATIONS: meal prep, cleaning, laundry, shopping, and community activity  PERSONAL FACTORS: Age and 1 comorbidity: ALND and radiation hx  are also affecting patient's functional outcome.   REHAB POTENTIAL: Excellent  CLINICAL DECISION MAKING: Evolving/moderate complexity  EVALUATION COMPLEXITY: Moderate  GOALS: Goals reviewed with patient? Yes  SHORT TERM GOALS: Target date: 04/08/23    Pt will be ind with self MLD for edema reduction Baseline: Goal status: In progress 2.  Pt will decrease  volume to or less to demonstrate improving edema status Baseline:  Goal status: MET 1596  (8 ml more) 04/30/2023 3.  Pt will be evaluated and treated for shoulder with goals added as needed as instructed by MD during future visits.  Baseline:  Goal status:MET 04/04/2023  LONG TERM GOALS:  Target date: 05/10/23  Pt will reduce to less than volume difference between sides to demonstrate no active lymphedema Baseline:  Goal status: MET 04/29/2022 (1596 ml) 2.  Pt will obtain compression as needed for discharge depending on reduction Baseline:  Goal status: MET 04/11/2023   3. Pt will be able to perform AROM  elevation in supine to atleast 120 degrees without compensation  Goal Status; In progress, 117 04/30/2023  4. Pt will be able to place 8 oz object on eye level shelf without UT compensation  GOALStatus: In progress  5. Pt will be tolerant of resisted exs for improving UE strength at 7 weeks post op Goal Status; In progress ;started isometrics 6. Pt will be able to return to driving for greater independence Goal status: new   PLAN:  PT FREQUENCY: 3x/week  PT DURATION:  4 weeks  PLANNED INTERVENTIONS: 97164- PT Re-evaluation, 97110-Therapeutic exercises, 97530- Therapeutic activity, [id number]- Neuromuscular re-education, 97535- Self Care, and 16109- Manual therapy  PLAN FOR NEXT SESSION:  Renewal next. Review band exs and new forearms on wall; Review standing dowel exs prn, progress  strength, review and cont MLD prn; see Media section for protocol though physician has released her, next appt with surgeon 05/21/2022. Pt needs new script for flat knit but should wait to be measured until fibrosis further decreases.   Berna Spare, PTA 05/28/23 12:08 PM   05/14/23: Start with forearms on wall holding short yellow theraband in hands:  Pull down keeping elbow bent and squeezing shoulder blade towards spine at end of pull  2 sets of 5 each  2.   Inch forearms up wall keeping elbows in only as high as you can control by keeping the shoulder blade down  2 sets of 5 each  Next:  Throw long band over top of door, close door and pull down with Lt arm, when returning to start position keep shoulder down  Cancer Re[facility] [phone]

## 2023-05-30 ENCOUNTER — Ambulatory Visit: Payer: Medicare PPO

## 2023-05-30 DIAGNOSIS — I972 Postmastectomy lymphedema syndrome: Secondary | ICD-10-CM | POA: Diagnosis not present

## 2023-05-30 DIAGNOSIS — Z96612 Presence of left artificial shoulder joint: Secondary | ICD-10-CM | POA: Diagnosis not present

## 2023-05-30 DIAGNOSIS — M25612 Stiffness of left shoulder, not elsewhere classified: Secondary | ICD-10-CM

## 2023-05-30 DIAGNOSIS — Z4789 Encounter for other orthopedic aftercare: Secondary | ICD-10-CM | POA: Diagnosis not present

## 2023-05-30 NOTE — Therapy (Addendum)
 OUTPATIENT PT UPPER EXTREMITY LYMPHEDEMA TREATMENT  Patient Name: Sarah Underwood MRN: [id number] DOB:10-16-1945, 78 y.o., female Today's Date: 05/30/2023  END OF SESSION:  PT End of Session - 05/30/23 1108     Visit Number 25    Number of Visits 37    Date for PT Re-Evaluation 07/11/23    Authorization Type [employer] Auth needed    Authorization Time Period 12 visits approved 05/03/2023-06/01/2023    Authorization - Visit Number 11    Authorization - Number of Visits 12    PT Start Time 1105    PT Stop Time 1200    PT Time Calculation (min) 55 min    Activity Tolerance Patient tolerated treatment well    Behavior During Therapy WFL for tasks assessed/performed                 Past Medical History:  Diagnosis Date   Basal cell carcinoma 1994   right side of node   Benign positional vertigo    with rolling over    Celiac disease    Dysrhythmia    Generalized anxiety disorder    GERD (gastroesophageal reflux disease)    Graves disease 2007   thyroid  nodule   Hard of hearing    Heart murmur    History of breast cancer 1995   left - treated with mastectomy   History of radiation therapy 2001   37 sessions following recurrence of breast cancer in left chest wall   Hypertension    Knee pain, bilateral 12/14/2014   Major depressive disorder in remission    Mild persistent asthma, uncomplicated 06/29/2015   Nocturia    OA (osteoarthritis) of hip 09/08/2015   Ovarian mass    small solid nodule 8x18mm: Right ovary - stable since 2000, follow u/s do not show this.   Renal mass 2021   followed by Dr. Alvaro, [facility].  Having every 6 month follow up   Seasonal allergies    Stress fracture of metatarsal bone of left foot 12/15/2019   Vegetarian diet    Past Surgical History:  Procedure Laterality Date   APPENDECTOMY     basal cell removal  7/94   right nose   BREAST BIOPSY Right 1987   benign fibroadenoma   breast cancer recurrence Left 01/2000   at site of left chest  wall after previous mastectomy with removal and treatment with radiaton.   CARPAL TUNNEL RELEASE Right 05/08/2013   Procedure: RIGHT CARPAL TUNNEL RELEASE;  Surgeon: Lamar LULLA Leonor Mickey., MD;  Location: [facility];  Service: Orthopedics;  Laterality: Right;   CATARACT EXTRACTION     03/20/14 left eye, 12/15 right eye   COLPOSCOPY W/ BIOPSY / CURETTAGE  03/12/12   benign, but + HR HPV   EYE SURGERY     bilat cataract surgery    MASTECTOMY Left 3/95   Stage IIb 0/14 LN ER/ PR + with mastectomy - Dr. Morna   MOHS SURGERY     right nare   OVARY SURGERY  1974   lt   REVERSE SHOULDER ARTHROPLASTY Left 02/26/2023   Procedure: REVERSE SHOULDER ARTHROPLASTY;  Surgeon: Sharl Selinda Dover, MD;  Location: [facility] OR;  Service: Orthopedics;  Laterality: Left;   TONSILLECTOMY AND ADENOIDECTOMY  1950   TOTAL HIP ARTHROPLASTY Right 09/08/2015   Procedure: TOTAL HIP ARTHROPLASTY ANTERIOR APPROACH;  Surgeon: Dempsey Moan, MD;  Location: [facility];  Service: Orthopedics;  Laterality: Right;   Patient Active Problem List  Diagnosis Date Noted   Shoulder fracture 02/23/2023   Closed fracture of proximal end of left humerus, unspecified fracture morphology, initial encounter 02/22/2023   Thyroid  nodule 02/22/2023   Major depressive disorder in remission 03/29/2020   Generalized anxiety disorder    Benign positional vertigo    Hypertension    Heart murmur    Stress fracture of metatarsal bone of left foot 12/15/2019   History of revision of total replacement of right hip joint 11/26/2017   Leg edema, left 09/26/2016   Post-nasal drainage 03/30/2016   OA (osteoarthritis) of hip 09/08/2015   Mild persistent asthma, uncomplicated 06/29/2015   Hip pain, bilateral 12/14/2014   Knee pain, bilateral 12/14/2014   Postmenopausal atrophic vaginitis 02/23/2013   Graves disease 2007   History of radiation therapy 2001   History of breast cancer 1995   Basal cell carcinoma 1994    PCP: Glendia Freeman, MD  REFERRING PROVIDER: Selinda Gosling, MD  REFERRING DIAG:  Diagnosis  C50.511,Z17.0 (ICD-10-CM) - Malignant neoplasm of lower-outer quadrant of right breast of female, estrogen receptor positive (HCC)  I89.0 (ICD-10-CM) - Lymphedema  Z47.89 orthopedic aftercare  S/P reverse total shoulder arthroplasty, left  Encounter for other orthopedic aftercare  Stiffness of left shoulder, not elsewhere classified  THERAPY DIAG:  Postmastectomy lymphedema  S/P reverse total shoulder arthroplasty, left  Encounter for other orthopedic aftercare  Stiffness of left shoulder, not elsewhere classified  ONSET DATE: 02/26/23  Rationale for Evaluation and Treatment: Rehabilitation  SUBJECTIVE                                                                                                                                                                                           SUBJECTIVE STATEMENT:  I've really been working on reaching behind my back so my Lt upper arm and chest has been more tight just from that new stretch (IR). I did notice a bit more swelling after our last workout like you said. So I wore my velcro compression more over the weekend and that started to help reduce the fluid. It did swell again above my elbow so I slept in the TG soft because that seems to reduce that.  PERTINENT HISTORY: Recent fall with left arm fracture.  Surgery with Reverse TSR 02/26/23.  This is the same side as her mastectomy which was diagnosed in 1995 with chest wall recurrence in 2001.  No remaining lymph nodes.   Shoulder surgery: Dr. Selinda Gosling -See media section for post-op approved exercises and okay for compression therapy.    PAIN:  Are you having pain?  PAIN:  Are you having pain? No,  just a lot of muscle soreness  from stretching   PRECAUTIONS: cleared for all ROM/activity of the shoulder per MD  RED FLAGS: None   WEIGHT BEARING RESTRICTIONS: No  FALLS:  Has patient fallen  in last 6 months? Yes. Number of falls 1 - does not remember details of her fall. Just remembers she was going outside.   LIVING ENVIRONMENT: Lives with: lives alone Has a CNA for assistance at home.   OCCUPATION: Retired  LEISURE: Not currently exercising  HAND DOMINANCE : Right  PRIOR LEVEL OF FUNCTION: Independent  PATIENT GOALS: prevent chronic lymphedema after this fall   OBJECTIVE (FROM EVAL) Note: Objective measures were completed at Evaluation unless otherwise noted.  COGNITION:  Overall cognitive status: Within functional limits for tasks assessed   PALPATION: +2 pitting edema back of hand, forearm, below, to around the distal 1/3rd of the upper arm.   OBSERVATIONS / OTHER ASSESSMENTS: bruising all fingers and hand, elbow, and upper arm from surgery.  Skin tight and shiny from edema.     POSTURE: wearing sling upon arrival and leaving  UPPER EXTREMITY AROM/PROM: Just starting wrist and elbow AROM - did not measure objectively.  No shoulder ROM allowed.   05/30/23:Left UE AAROM elevation in supine=   LEFT UE AROM elevation (scaption) in supine: 123    LEFT ER AROM supine:27   LEFT IR AROM in standing: fingertips to medial glut fold and thumb reaches glut crease   LEFT UE FLEX in standing pt can briefly hold at 64 degrees when therapist assists with concentric contraction  04/04/2023 Left UE AAROM elevation in supine=   LEFT UE AROM elevation in supine: 100  04/11/2023   LEFT ER AROM supine:17   LEFT IR AROM supine:68 30 deg abd      IN SUPINE 04/30/2023 Left shoulder flex AROM in supine;  117 with elbow flexed to initiate   IR AROM  to stomach    ER  AROM 30 degrees at 30 degrees abduction     LYMPHEDEMA ASSESSMENTS:  LANDMARK RIGHT eval  At axilla 30.4  15cm proximal to olecranon 29.4  10 cm proximal to olecranon process 28.3  Olecranon process 24.7  15cm proximal to ulnar styloid 22.1  10 cm proximal to ulnar styloid process 18.7  Just proximal to  ulnar styloid process 16.2  Across hand at thumb web space 19.8  At base of 2nd digit 6.4  (Blank rows = not tested)  LANDMARK LEFT  eval LEFT 03/26/2023 LEFT 04/11/2023 LEFT  04/30/2023 LEFT 05/09/23 Left 05/23/23  At axilla 33 31 29.6 29.7 29.7 30.3  15cm proximal to olecranon 32.5 29.4 28.8 28.4 27.8 27.8  10 cm proximal to olecranon process 34.5 30.2 28.0 27.6 27.4 27.4  Olecranon process 30.5 26.6 24.0 23.4 23.3 22.9  15cm proximal to ulnar styloid  28.7 25.6 23.0 23.0 23.3 22.9  10 cm proximal to ulnar styloid process 26.5 23.4 20.5 21.1 21.9 22  Just proximal to ulnar styloid process 18.5 17.2 15.9 15.8 15.9 15.7  Across hand at thumb web space 20.4 18.8 19.2 19.0 18.4 18.8  At base of 2nd digit 7.0 6.1 5.85 6.0 5.9 5.9  (Blank rows = not tested)  Volume (at eval):          Length 7cm Rt:                         Lt:   04/30/2023  1596 ml difference   PATIENT SURVEYS:  Quick Dash 68% limited EVAL 04/29/2022     34%   TODAY'S TREATMENT:                                                                                                                                         DATE:  05/30/23: Self Care Discussed pts current functional status and progress towards goals for MD renewal.  Therapeutic Exercises UE Ranger x 4 mins for flex and then x 2 mins for scaption as able, pt able to return good demo of correct technique with decreased scapular compensations Finger Ladder for flex x 4 up to #30.  Therapeutic Activity Had pt practice placing 1# weight on shelf, then tried reaching forward with weight multiple trials of each with VC's to be aware of compensations. Pt reports feeling good stretch in trunk with reaching with these activities.  Manual Therapy P/ROM of Lt shoulder into flex, abd and er/IR all to tolerance with scapular depression by therapist  05/28/23: Manual Therapy MLD to Lt UE as follows: Short neck, superficial and deep  abdominals, Rt axillary nodes, anterior inter-axillary anasotmosis, Lt inguinal nodes, Lt axillo-inguinal anastomosis, Lt UE working from proximal to distal  down to forearm but spent more time focusing on antecubital fossa and distal upper arm where most fibrosis, then retracing all steps P/ROM of Lt shoulder into flex, abd and er/IR all to tolerance with scapular depression by therapist STM in Rt S/L with cocoa butter to medial scapular border and UT and briefly to upper arm when back in supine  05/23/23: Therapeutic Exercises Pulleys into flex and scaption x 2 mins each UE Ranger on wall x 15 into flex, trial of scaption x 3 reps but pt felt unbalanced so stopped scaption Finger ladder for LT UE: Flex up to # 28 x 6 reps, and then Scaption up to # 26-27 x 5 reps. Abd x 3 reps up to # 20. Pt with excellent technique UBE Level 1, 1 min each direction x 2 mins total with PTA assessing technique during; starting explaining low and slow progression for when pt gets back to exercising at her gym. Also that she should exercises wearing her compression.  Yellow theraband for Lt ext, flex, and punch (flex with elbow bent) x 5-7 each returning therapist demo Manual Therapy P/ROM briefly at end of session to Lt shoulder into flex, and and er/IR x 5 mins as time allowed   05/21/2023 Pt s/p recent fall last Friday; Xrays negative,treatment modified Pt wearing TG soft on Left UE  Alternating isometrics gentle for IR and ER 3 x 20 AROM IR and ER, elbow flex and ext x 10 ea AA shoulder flexion x 10 PROM left shoulder flex, scaption, abd, IR and ER  AROM flex 50 -90 degrees x 8, x 5 MLD to Lt UE: Short neck, 5  breaths, Rt axillary nodes, anterior inter-axillary anasotmosis, Lt inguinal nodes, Lt axillo-inguinal anastomosis,  and  Left UE working from proximal to distal then retracing all steps and ending with LN's  05/18/2023 Measured length for sleeve 45 cm;requires long Val PTA came in and discussed  sleeve info with pt. Awaiting script Tried pulleys but held secondary to difficulty getting it started Scapular retraction yellow band x 10, emphasis on retraction with depression Long yellow band over door with pt pulling down and then having assist of band to elevate while maintaining scapular depression Alternating isometrics IR/ER 2 x 20, rhythmic stabs at 90 deg flexion 1 x 20 secs Supine alphabet x 1 Supine AROM with elbow straight and raising from approx 30 degrees to 90 degrees with scapular depression x 5 with good control Supine wand for shoulder flexion to improve ROM, and to focus on scapular depression x 5. Updated HEP Finger ladder x 2  Shoulder Ranger with mild A of PT for elevation x 4-5 reps. Pt did a good job keeping her left UT quiet  05/16/23: Therapeutic Exercises Pt performed A/ROM of shoulders in front of mirror for work on decreasing Lt scapular compensation with flex and scaption.  Forearms on wall with yellow theraband for scap retract x 10 each and then with no theraband wall walks in small motion up and down x 5 each with tactile cues to decrease Lt scapular compensation and to keep elbow at side. Handout issued Pulleys into flex x 2 mins and abd x 3 mins each with VC's to decrease scapular compensations and to encourage pt to use a slow pace so she can be more aware of when compensation begins and to stop lifting arm at that point.  Self Care Discussed current compression. Pt reports not wearing velcro compression garment much at all anymore as it has gotten too big with how much her arm has reduced. Pt would really like to get a compression sleeve. Had a demo one at the clinic that pt was able to partially pull on (was too small for her to pull all the way up her arm) and she was able to do this fairly well despite recent total reverse shoulder. Pt was very happy about this and would like to look into seeing if her insurance will cover this.  Manual Therapy STM:  started with pt sitting EOB for STM to Lt UT with cocoa butter for trigger point release, pt with multiple areas of tightness here. Then transitioned to supine for further STM to Lt pect tendon and lateral trunk during P/ROM P/ROM of Lt shoulder into flex and scaption as pt tolerated and with scapular depression by therapist as able   05/14/23: Therapeutic Exercises Reviewed dowel exercises into flex and abd x 5 reps in front of mirror returning therapist demo Forearms on wall with yellow theraband for scap retract x 5 each and then wall walks in small motion up and down x 5 each with tactile cues to decrease Lt scapular compensation Lt shoulder ext x 10 with yellow theraband over door A/ROM in front of mirror for Lt shoulder into flex and abd multiple times working on decreasing scapular compensation and paying attention to what the compensation feels like and looks like. Pt was able to verbalize good understanding after practicing this. Manual Therapy P/ROM to Lt shoulder in supine into flex, scaption, er and IR with scapular depression throughout by therapist. Pain reported in posterior shoulder at end scaption motions so limited motion here  05/11/2023 Wand shoulder flexion in standing x 4 held due to UT compensation Scapular retraction and shoulder extension with yellow band x 10 ea Sitting scapular PNF retraction and depression with PT resistance 2 x 10 Alternating isometrics IR and ER 2 x 20 sec Supine AROM left at 30 deg to 90 deg  x 10, emphasis on scapular depression Supine alphabet A-Z x 1 Updated HEP MLD to Lt UE: Short neck, superficial and deep abdominals, Rt axillary nodes, Rt intact anterior thorax sequence, anterior inter-axillary anasotmosis, Lt inguinal nodes, Lt axillo-inguinal anastomosis, Lt chest near axilla where pt has some accumulation of fluid, and UE working from proximal to distal then retracing all steps  05/09/23: Manual Therapy MLD to Lt UE: Short neck,  superficial and deep abdominals, Rt axillary nodes, Rt intact anterior thorax sequence, anterior inter-axillary anasotmosis, Lt inguinal nodes, Lt axillo-inguinal anastomosis, Lt chest near axilla where pt has some accumulation of fluid, and UE working from proximal to distal then retracing all steps P/ROM of Lt shoulder to pts tolerance into flex and scaption briefly, pt reports still feeling very sore from her stretches this weekend Therapeutic Exercises Briefly reviewed standing dowel extension and showed pt how she can work on IR with dowel behind back. She reports feeling good stretch with this.   05/07/23 Reviewed garments status about failing velcro and emailed Melissa at [facility] for direction on this x Therapeutic Exercise Discussed standing dowel motion to mimic reach for top of steering wheel Supine flexion AAROM with 1 finger only x 3 and then small circles at 90deg x 5 c/cc no assistance needed Sidelying AROM into abduction x 3 and flexion x 3 as tolerated Manual Therapy MLD to Lt UE superficial and deep abdominals (reviewed with pt 5 diaphragmatic breaths here for home), Rt axillary nodes, anterior inter-axillary anasotmosis, Lt inguinal nodes, Lt axillo-inguinal anastomosis, Lt UE working from proximal to distal then retracing all steps reviewing all with pt throughout P/ROM of Lt shoulder to pts tolerance into flex, abd and er/IR  05/04/23: Manual Therapy MLD to Lt UE reviewing with pt per handout issued at last session: Short neck, superficial and deep abdominals (reviewed with pt 5 diaphragmatic breaths here for home), Rt axillary nodes, anterior inter-axillary anasotmosis, Lt inguinal nodes, Lt axillo-inguinal anastomosis, Lt UE working from proximal to distal then retracing all steps reviewing all with pt throughout P/ROM of Lt shoulder to pts tolerance into flex, abd and er/IR STM gently to ant and sup shoulder during P/ROM and along incision Assisted pt with donning her  velcro compression garment at end of session. Also issued her an extra oversleeve we had to help her velcro stay as the ends of the velcro are starting to unfurl during day. Pt plans to call compression company about this.       PATIENT EDUCATION:  Access Code: [email or web address] URL: [email or web address] Date: 04/13/2023 Prepared by: Grayce Sheldon  Exercises - Standing Isometric Shoulder Internal Rotation at Doorway  - 1 x daily - 7 x weekly - 3 sets - 10 reps - Standing Isometric Shoulder External Rotation with Doorway  - 1 x daily - 7 x weekly - 1 sets - 5-10 reps - Isometric Shoulder Flexion at Wall  - 1 x daily - 7 x weekly - 1 sets - 5-10 reps - 5 hold - Isometric Shoulder Extension at Wall  - 1 x daily - 7 x weekly - 1 sets - 5-10 reps - Isometric Shoulder Abduction at Wall  - 1 x  daily - 7 x weekly - 1 sets - 5-10 reps   05/02/23: Education details: Forearms on wall for postural strength Person educated: Patient Education method: Explanation, Demonstration, and Verbal cues, and tactile cues, handout issued (at 05/16/23 session) Education comprehension: verbalized understanding, returned demonstration, tactile and VC's and pt will benefit from further review  HOME EXERCISE PROGRAM: Scapular retraction, shoulder extension yellow band x 10 Supine alphabet Isometrics for Lt shoulder Standing dowel exercises Self MLD Forearms on wall for scapular strength with yellow theraband  ASSESSMENT:  CLINICAL IMPRESSION: MD renewal done this session. Pt is making steady progress since her reverse TSR. Her Lt shoulder A/ROM is slowly improving, which is to be expected due to the nature of her injury and subsequent surgery. She is demonstrating improved A/ROM of Lt shoulder with less scapular compensation as she is able to demonstrate improved awareness and control of scapular muscles.  OBJECTIVE IMPAIRMENTS: decreased activity tolerance, decreased knowledge of condition, decreased  knowledge of use of DME, decreased mobility, decreased ROM, and increased edema.   ACTIVITY LIMITATIONS: carrying, lifting, sleeping, bathing, dressing, and reach over head  PARTICIPATION LIMITATIONS: meal prep, cleaning, laundry, shopping, and community activity  PERSONAL FACTORS: Age and 1 comorbidity: ALND and radiation hx  are also affecting patient's functional outcome.   REHAB POTENTIAL: Excellent  CLINICAL DECISION MAKING: Evolving/moderate complexity  EVALUATION COMPLEXITY: Moderate  GOALS: Goals reviewed with patient? Yes  SHORT TERM GOALS: Target date: 04/08/23    Pt will be ind with self MLD for edema reduction Baseline: Goal status: In progress 2.  Pt will decrease volume to or less to demonstrate improving edema status Baseline:  Goal status: MET 1596  (8 ml more) 04/30/2023 3.  Pt will be evaluated and treated for shoulder with goals added as needed as instructed by MD during future visits.  Baseline:  Goal status:MET 04/04/2023  LONG TERM GOALS: Target date: 05/10/23  Pt will reduce to less than volume difference between sides to demonstrate no active lymphedema Baseline:  Goal status: MET 04/29/2022 (1596 ml) 2.  Pt will obtain compression as needed for discharge depending on reduction Baseline:  Goal status: MET 04/11/2023   3. Pt will be able to perform AROM  elevation in supine to atleast 120 degrees without compensation  Goal Status; MET: 117 04/30/2023; 123 degrees - 05/30/23  4. Pt will be able to place 8 oz object on eye level shelf without UT compensation  GOALStatus: In progress; 05/30/23 - pt able to lift 1# half way to eye level shelf before compensation  5. Pt will be tolerant of resisted exs for improving UE strength at 7 weeks post op Goal Status; PARTIALLY MET - 05/30/23 - pt is independent with current HEP of A/AA/ROM and isometric strength, has started progressing to theraband  6. Pt will be able to return to driving for greater  independence Goal status: PROGRESSING - 05/30/23 - pt is able to drive close to home (grocery store and pharmacy) but has not been able to return to highway driving that will require quick turns and maneuvering she is not able to do yet  7. Pt will be able to achieve 80-90 degrees Lt shoulder flexion for increased ease with ADLs and washing hair.  Baseline: 05/30/23 - 64 degrees   Goal Status: NEW  PLAN:  PT FREQUENCY: 2-3x/week  PT DURATION:  4 weeks  PLANNED INTERVENTIONS: 97164- PT Re-evaluation, 97110-Therapeutic exercises, 97530- Therapeutic activity, 97112- Neuromuscular re-education, 97535- Self Care, and 02859- Manual  therapy  PLAN FOR NEXT SESSION:  Renewal done this visit. Pt reducing freq to 2x/wk after this week. Review band exs and new forearms on wall; Review standing dowel exs prn, progress  strength, review and cont MLD prn; see Media section for protocol though physician has released her, next appt with surgeon 05/21/2022. Pt needs new script for flat knit but should wait to be measured until fibrosis further decreases.   Berwyn Knights, PTA 05/30/23 1:44 PM Grayce Sheldon, PT 05/30/23 6:10 PM   05/14/23: Start with forearms on wall holding short yellow theraband in hands:  Pull down keeping elbow bent and squeezing shoulder blade towards spine at end of pull  2 sets of 5 each  2.   Inch forearms up wall keeping elbows in only as high as you can control by keeping the shoulder blade down  2 sets of 5 each  Next:  Throw long band over top of door, close door and pull down with Lt arm, when returning to start position keep shoulder down  [facility] [phone]

## 2023-05-30 NOTE — Addendum Note (Signed)
 Addended by: Latisha Poland on: 05/30/2023 06:12 PM   Modules accepted: Orders

## 2023-06-01 ENCOUNTER — Ambulatory Visit: Payer: Medicare PPO

## 2023-06-01 DIAGNOSIS — Z96612 Presence of left artificial shoulder joint: Secondary | ICD-10-CM

## 2023-06-01 DIAGNOSIS — I972 Postmastectomy lymphedema syndrome: Secondary | ICD-10-CM

## 2023-06-01 DIAGNOSIS — M25612 Stiffness of left shoulder, not elsewhere classified: Secondary | ICD-10-CM

## 2023-06-01 DIAGNOSIS — Z4789 Encounter for other orthopedic aftercare: Secondary | ICD-10-CM

## 2023-06-01 NOTE — Therapy (Addendum)
 OUTPATIENT PT UPPER EXTREMITY LYMPHEDEMA TREATMENT  Patient Name: Sarah Underwood MRN: [id number] DOB:July 25, 1945, 78 y.o., female Today's Date: 06/01/2023  END OF SESSION:  PT End of Session - 06/01/23 1108     Visit Number 26    Number of Visits 37    Date for PT Re-Evaluation 07/11/23    Authorization Type [employer] Auth needed    Authorization Time Period new auth sent on 06/02/23; 12 visits approved 05/03/2023-06/01/2023    Authorization - Visit Number 12   new auth sent 06/01/23   Authorization - Number of Visits 12    PT Start Time 1107    PT Stop Time 1201    PT Time Calculation (min) 54 min    Activity Tolerance Patient tolerated treatment well    Behavior During Therapy [facility] for tasks assessed/performed                 Past Medical History:  Diagnosis Date   Basal cell carcinoma 1994   right side of node   Benign positional vertigo    with rolling over    Celiac disease    Dysrhythmia    Generalized anxiety disorder    GERD (gastroesophageal reflux disease)    Graves disease 2007   thyroid  nodule   Hard of hearing    Heart murmur    History of breast cancer 1995   left - treated with mastectomy   History of radiation therapy 2001   37 sessions following recurrence of breast cancer in left chest wall   Hypertension    Knee pain, bilateral 12/14/2014   Major depressive disorder in remission    Mild persistent asthma, uncomplicated 06/29/2015   Nocturia    OA (osteoarthritis) of hip 09/08/2015   Ovarian mass    small solid nodule 8x60mm: Right ovary - stable since 2000, follow u/s do not show this.   Renal mass 2021   followed by Dr. Alvaro, [facility].  Having every 6 month follow up   Seasonal allergies    Stress fracture of metatarsal bone of left foot 12/15/2019   Vegetarian diet    Past Surgical History:  Procedure Laterality Date   APPENDECTOMY     basal cell removal  7/94   right nose   BREAST BIOPSY Right 1987   benign fibroadenoma   breast cancer  recurrence Left 01/2000   at site of left chest wall after previous mastectomy with removal and treatment with radiaton.   CARPAL TUNNEL RELEASE Right 05/08/2013   Procedure: RIGHT CARPAL TUNNEL RELEASE;  Surgeon: Lamar LULLA Leonor Mickey., MD;  Location:  SURGERY CENTER;  Service: Orthopedics;  Laterality: Right;   CATARACT EXTRACTION     03/20/14 left eye, 12/15 right eye   COLPOSCOPY W/ BIOPSY / CURETTAGE  03/12/12   benign, but + HR HPV   EYE SURGERY     bilat cataract surgery    MASTECTOMY Left 3/95   Stage IIb 0/14 LN ER/ PR + with mastectomy - Dr. Morna   MOHS SURGERY     right nare   OVARY SURGERY  1974   lt   REVERSE SHOULDER ARTHROPLASTY Left 02/26/2023   Procedure: REVERSE SHOULDER ARTHROPLASTY;  Surgeon: Sharl Selinda Dover, MD;  Location: [facility] OR;  Service: Orthopedics;  Laterality: Left;   TONSILLECTOMY AND ADENOIDECTOMY  1950   TOTAL HIP ARTHROPLASTY Right 09/08/2015   Procedure: TOTAL HIP ARTHROPLASTY ANTERIOR APPROACH;  Surgeon: Dempsey Moan, MD;  Location: [facility] ORS;  Service: Orthopedics;  Laterality: Right;   Patient Active Problem List   Diagnosis Date Noted   Shoulder fracture 02/23/2023   Closed fracture of proximal end of left humerus, unspecified fracture morphology, initial encounter 02/22/2023   Thyroid  nodule 02/22/2023   Major depressive disorder in remission 03/29/2020   Generalized anxiety disorder    Benign positional vertigo    Hypertension    Heart murmur    Stress fracture of metatarsal bone of left foot 12/15/2019   History of revision of total replacement of right hip joint 11/26/2017   Leg edema, left 09/26/2016   Post-nasal drainage 03/30/2016   OA (osteoarthritis) of hip 09/08/2015   Mild persistent asthma, uncomplicated 06/29/2015   Hip pain, bilateral 12/14/2014   Knee pain, bilateral 12/14/2014   Postmenopausal atrophic vaginitis 02/23/2013   Graves disease 2007   History of radiation therapy 2001   History of breast cancer 1995    Basal cell carcinoma 1994    PCP: Glendia Freeman, MD  REFERRING PROVIDER: Selinda Gosling, MD  REFERRING DIAG:  Diagnosis  C50.511,Z17.0 (ICD-10-CM) - Malignant neoplasm of lower-outer quadrant of right breast of female, estrogen receptor positive (HCC)  I89.0 (ICD-10-CM) - Lymphedema  Z47.89 orthopedic aftercare  S/P reverse total shoulder arthroplasty, left  Encounter for other orthopedic aftercare  Stiffness of left shoulder, not elsewhere classified  THERAPY DIAG:  Postmastectomy lymphedema  S/P reverse total shoulder arthroplasty, left  Encounter for other orthopedic aftercare  Stiffness of left shoulder, not elsewhere classified  ONSET DATE: 02/26/23  Rationale for Evaluation and Treatment: Rehabilitation  SUBJECTIVE                                                                                                                                                                                           SUBJECTIVE STATEMENT:  I hurt my shoulder a little yesterday just doing more around the house. I was reaching up into my closet and then putting dishes away and I just wasn't being careful with my technique.   PERTINENT HISTORY: Recent fall with left arm fracture.  Surgery with Reverse TSR 02/26/23.  This is the same side as her mastectomy which was diagnosed in 1995 with chest wall recurrence in 2001.  No remaining lymph nodes.   Shoulder surgery: Dr. Selinda Gosling -See media section for post-op approved exercises and okay for compression therapy.    PAIN:  PAIN:  Are you having pain? Yes NPRS scale: 3/10 Pain location: shoulder, and upper am Pain orientation: Left, Proximal, and Upper  PAIN TYPE: sore, muscle pain, cramping Pain description: intermittent  Aggravating factors: didn't watch my technique with Lt shoulder A/ROM yesterday Relieving  factors: massage and stretch    PRECAUTIONS: cleared for all ROM/activity of the shoulder per MD  RED  FLAGS: None   WEIGHT BEARING RESTRICTIONS: No  FALLS:  Has patient fallen in last 6 months? Yes. Number of falls 1 - does not remember details of her fall. Just remembers she was going outside.   LIVING ENVIRONMENT: Lives with: lives alone Has a CNA for assistance at home.   OCCUPATION: Retired  LEISURE: Not currently exercising  HAND DOMINANCE : Right  PRIOR LEVEL OF FUNCTION: Independent  PATIENT GOALS: prevent chronic lymphedema after this fall   OBJECTIVE (FROM EVAL) Note: Objective measures were completed at Evaluation unless otherwise noted.  COGNITION:  Overall cognitive status: Within functional limits for tasks assessed   PALPATION: +2 pitting edema back of hand, forearm, below, to around the distal 1/3rd of the upper arm.   OBSERVATIONS / OTHER ASSESSMENTS: bruising all fingers and hand, elbow, and upper arm from surgery.  Skin tight and shiny from edema.     POSTURE: wearing sling upon arrival and leaving  UPPER EXTREMITY AROM/PROM: Just starting wrist and elbow AROM - did not measure objectively.  No shoulder ROM allowed.   05/30/23:Left UE AAROM elevation in supine=   LEFT UE AROM elevation (scaption) in supine: 123    LEFT ER AROM supine:27   LEFT IR AROM in standing: fingertips to medial glut fold and thumb reaches glut crease   LEFT UE FLEX in standing pt can briefly hold at 64 degrees when therapist assists with concentric contraction  04/04/2023 Left UE AAROM elevation in supine=   LEFT UE AROM elevation in supine: 100  04/11/2023   LEFT ER AROM supine:17   LEFT IR AROM supine:68 30 deg abd      IN SUPINE 04/30/2023 Left shoulder flex AROM in supine;  117 with elbow flexed to initiate   IR AROM  to stomach    ER  AROM 30 degrees at 30 degrees abduction     LYMPHEDEMA ASSESSMENTS:  LANDMARK RIGHT eval  At axilla 30.4  15cm proximal to olecranon 29.4  10 cm proximal to olecranon process 28.3  Olecranon process 24.7  15cm proximal to ulnar  styloid 22.1  10 cm proximal to ulnar styloid process 18.7  Just proximal to ulnar styloid process 16.2  Across hand at thumb web space 19.8  At base of 2nd digit 6.4  (Blank rows = not tested)  LANDMARK LEFT  eval LEFT 03/26/2023 LEFT 04/11/2023 LEFT  04/30/2023 LEFT 05/09/23 Left 05/23/23  At axilla 33 31 29.6 29.7 29.7 30.3  15cm proximal to olecranon 32.5 29.4 28.8 28.4 27.8 27.8  10 cm proximal to olecranon process 34.5 30.2 28.0 27.6 27.4 27.4  Olecranon process 30.5 26.6 24.0 23.4 23.3 22.9  15cm proximal to ulnar styloid  28.7 25.6 23.0 23.0 23.3 22.9  10 cm proximal to ulnar styloid process 26.5 23.4 20.5 21.1 21.9 22  Just proximal to ulnar styloid process 18.5 17.2 15.9 15.8 15.9 15.7  Across hand at thumb web space 20.4 18.8 19.2 19.0 18.4 18.8  At base of 2nd digit 7.0 6.1 5.85 6.0 5.9 5.9  (Blank rows = not tested)  Volume (at eval):          Length 7cm Rt:                         Lt:   04/30/2023  1596 ml difference   PATIENT SURVEYS:  Quick Dash 68% limited EVAL 04/29/2022     34%   TODAY'S TREATMENT:                                                                                                                                         DATE:  06/01/23: Therapeutic Exercises Pulleys for review to assess technique x 1 min into flex and scaption with cues to relax at top of motions as pt is compensating at that point UE Ranger into flex x 4 mins and VC's to decrease scapular compensation top of motion Finger Ladder into flex x 3 up to #29, then Lt scaption x 1 up to #29 Manual Therapy P/ROM of Lt shoulder into flex, abd and er/IR all to tolerance with scapular depression by therapist STM with cocoa butter to Lt upper arm, lats, UT and deltoid muscles  05/30/23: Self Care Discussed pts current functional status and progress towards goals for MD renewal.  Therapeutic Exercises UE Ranger x 4 mins for flex and then x 2 mins for  scaption as able, pt able to return good demo of correct technique with decreased scapular compensations Finger Ladder for flex x 4 up to #30.  Therapeutic Activity Had pt practice placing 1# weight on shelf, then tried reaching forward with weight multiple trials of each with VC's to be aware of compensations. Pt reports feeling good stretch in trunk with reaching with these activities.  Manual Therapy P/ROM of Lt shoulder into flex, abd and er/IR all to tolerance with scapular depression by therapist  05/28/23: Manual Therapy MLD to Lt UE as follows: Short neck, superficial and deep abdominals, Rt axillary nodes, anterior inter-axillary anasotmosis, Lt inguinal nodes, Lt axillo-inguinal anastomosis, Lt UE working from proximal to distal  down to forearm but spent more time focusing on antecubital fossa and distal upper arm where most fibrosis, then retracing all steps P/ROM of Lt shoulder into flex, abd and er/IR all to tolerance with scapular depression by therapist STM in Rt S/L with cocoa butter to medial scapular border and UT and briefly to upper arm when back in supine  05/23/23: Therapeutic Exercises Pulleys into flex and scaption x 2 mins each UE Ranger on wall x 15 into flex, trial of scaption x 3 reps but pt felt unbalanced so stopped scaption Finger ladder for LT UE: Flex up to # 28 x 6 reps, and then Scaption up to # 26-27 x 5 reps. Abd x 3 reps up to # 20. Pt with excellent technique UBE Level 1, 1 min each direction x 2 mins total with PTA assessing technique during; starting explaining low and slow progression for when pt gets back to exercising at her gym. Also that she should exercises wearing her compression.  Yellow theraband for Lt ext, flex, and punch (flex with elbow bent) x 5-7 each  returning therapist demo Manual Therapy P/ROM briefly at end of session to Lt shoulder into flex, and and er/IR x 5 mins as time allowed   05/21/2023 Pt s/p recent fall last Friday; Xrays  negative,treatment modified Pt wearing TG soft on Left UE  Alternating isometrics gentle for IR and ER 3 x 20 AROM IR and ER, elbow flex and ext x 10 ea AA shoulder flexion x 10 PROM left shoulder flex, scaption, abd, IR and ER  AROM flex 50 -90 degrees x 8, x 5 MLD to Lt UE: Short neck, 5 breaths, Rt axillary nodes, anterior inter-axillary anasotmosis, Lt inguinal nodes, Lt axillo-inguinal anastomosis,  and  Left UE working from proximal to distal then retracing all steps and ending with LN's  05/18/2023 Measured length for sleeve 45 cm;requires long Val PTA came in and discussed sleeve info with pt. Awaiting script Tried pulleys but held secondary to difficulty getting it started Scapular retraction yellow band x 10, emphasis on retraction with depression Long yellow band over door with pt pulling down and then having assist of band to elevate while maintaining scapular depression Alternating isometrics IR/ER 2 x 20, rhythmic stabs at 90 deg flexion 1 x 20 secs Supine alphabet x 1 Supine AROM with elbow straight and raising from approx 30 degrees to 90 degrees with scapular depression x 5 with good control Supine wand for shoulder flexion to improve ROM, and to focus on scapular depression x 5. Updated HEP Finger ladder x 2  Shoulder Ranger with mild A of PT for elevation x 4-5 reps. Pt did a good job keeping her left UT quiet  05/16/23: Therapeutic Exercises Pt performed A/ROM of shoulders in front of mirror for work on decreasing Lt scapular compensation with flex and scaption.  Forearms on wall with yellow theraband for scap retract x 10 each and then with no theraband wall walks in small motion up and down x 5 each with tactile cues to decrease Lt scapular compensation and to keep elbow at side. Handout issued Pulleys into flex x 2 mins and abd x 3 mins each with VC's to decrease scapular compensations and to encourage pt to use a slow pace so she can be more aware of when compensation  begins and to stop lifting arm at that point.  Self Care Discussed current compression. Pt reports not wearing velcro compression garment much at all anymore as it has gotten too big with how much her arm has reduced. Pt would really like to get a compression sleeve. Had a demo one at the clinic that pt was able to partially pull on (was too small for her to pull all the way up her arm) and she was able to do this fairly well despite recent total reverse shoulder. Pt was very happy about this and would like to look into seeing if her insurance will cover this.  Manual Therapy STM: started with pt sitting EOB for STM to Lt UT with cocoa butter for trigger point release, pt with multiple areas of tightness here. Then transitioned to supine for further STM to Lt pect tendon and lateral trunk during P/ROM P/ROM of Lt shoulder into flex and scaption as pt tolerated and with scapular depression by therapist as able   05/14/23: Therapeutic Exercises Reviewed dowel exercises into flex and abd x 5 reps in front of mirror returning therapist demo Forearms on wall with yellow theraband for scap retract x 5 each and then wall walks in small motion up and  down x 5 each with tactile cues to decrease Lt scapular compensation Lt shoulder ext x 10 with yellow theraband over door A/ROM in front of mirror for Lt shoulder into flex and abd multiple times working on decreasing scapular compensation and paying attention to what the compensation feels like and looks like. Pt was able to verbalize good understanding after practicing this. Manual Therapy P/ROM to Lt shoulder in supine into flex, scaption, er and IR with scapular depression throughout by therapist. Pain reported in posterior shoulder at end scaption motions so limited motion here  05/11/2023 Wand shoulder flexion in standing x 4 held due to UT compensation Scapular retraction and shoulder extension with yellow band x 10 ea Sitting scapular PNF retraction  and depression with PT resistance 2 x 10 Alternating isometrics IR and ER 2 x 20 sec Supine AROM left at 30 deg to 90 deg  x 10, emphasis on scapular depression Supine alphabet A-Z x 1 Updated HEP MLD to Lt UE: Short neck, superficial and deep abdominals, Rt axillary nodes, Rt intact anterior thorax sequence, anterior inter-axillary anasotmosis, Lt inguinal nodes, Lt axillo-inguinal anastomosis, Lt chest near axilla where pt has some accumulation of fluid, and UE working from proximal to distal then retracing all steps  05/09/23: Manual Therapy MLD to Lt UE: Short neck, superficial and deep abdominals, Rt axillary nodes, Rt intact anterior thorax sequence, anterior inter-axillary anasotmosis, Lt inguinal nodes, Lt axillo-inguinal anastomosis, Lt chest near axilla where pt has some accumulation of fluid, and UE working from proximal to distal then retracing all steps P/ROM of Lt shoulder to pts tolerance into flex and scaption briefly, pt reports still feeling very sore from her stretches this weekend Therapeutic Exercises Briefly reviewed standing dowel extension and showed pt how she can work on IR with dowel behind back. She reports feeling good stretch with this.   05/07/23 Reviewed garments status about failing velcro and emailed Melissa at [facility] for direction on this x Therapeutic Exercise Discussed standing dowel motion to mimic reach for top of steering wheel Supine flexion AAROM with 1 finger only x 3 and then small circles at 90deg x 5 c/cc no assistance needed Sidelying AROM into abduction x 3 and flexion x 3 as tolerated Manual Therapy MLD to Lt UE superficial and deep abdominals (reviewed with pt 5 diaphragmatic breaths here for home), Rt axillary nodes, anterior inter-axillary anasotmosis, Lt inguinal nodes, Lt axillo-inguinal anastomosis, Lt UE working from proximal to distal then retracing all steps reviewing all with pt throughout P/ROM of Lt shoulder to pts tolerance into  flex, abd and er/IR  05/04/23: Manual Therapy MLD to Lt UE reviewing with pt per handout issued at last session: Short neck, superficial and deep abdominals (reviewed with pt 5 diaphragmatic breaths here for home), Rt axillary nodes, anterior inter-axillary anasotmosis, Lt inguinal nodes, Lt axillo-inguinal anastomosis, Lt UE working from proximal to distal then retracing all steps reviewing all with pt throughout P/ROM of Lt shoulder to pts tolerance into flex, abd and er/IR STM gently to ant and sup shoulder during P/ROM and along incision Assisted pt with donning her velcro compression garment at end of session. Also issued her an extra oversleeve we had to help her velcro stay as the ends of the velcro are starting to unfurl during day. Pt plans to call compression company about this.       PATIENT EDUCATION:  Access Code: [email or web address] URL: [email or web address] Date: 04/13/2023 Prepared by: Grayce Sheldon  Exercises - Standing Isometric Shoulder  Internal Rotation at Doorway  - 1 x daily - 7 x weekly - 3 sets - 10 reps - Standing Isometric Shoulder External Rotation with Doorway  - 1 x daily - 7 x weekly - 1 sets - 5-10 reps - Isometric Shoulder Flexion at Wall  - 1 x daily - 7 x weekly - 1 sets - 5-10 reps - 5 hold - Isometric Shoulder Extension at Wall  - 1 x daily - 7 x weekly - 1 sets - 5-10 reps - Isometric Shoulder Abduction at Wall  - 1 x daily - 7 x weekly - 1 sets - 5-10 reps   05/02/23: Education details: Forearms on wall for postural strength Person educated: Patient Education method: Programmer, Multimedia, Demonstration, and Verbal cues, and tactile cues, handout issued (at 05/16/23 session) Education comprehension: verbalized understanding, returned demonstration, tactile and VC's and pt will benefit from further review  HOME EXERCISE PROGRAM: Scapular retraction, shoulder extension yellow band x 10 Supine alphabet Isometrics for Lt shoulder Standing dowel  exercises Self MLD Forearms on wall for scapular strength with yellow theraband  ASSESSMENT:  CLINICAL IMPRESSION: Medicare recert will be submitted Monday for request for more visits. MD renewal was done at last session due to dates. From that session assessments towards goals, pt is progressing well at this stage of her recovery since her reverse total shoulder replacement. Lt shoulder A/ROM is slowly and steadily improving which is to be expected due to nature of injury and subsequent surgery.  She continues to demonstrate improved scapular awareness with A/ROM of Lt shoulder with ADLs. She will benefit from continued physical therapy at this time to help her return to as much PLOF as able as she continues with healing allowing her tolerance to activity to progress.   OBJECTIVE IMPAIRMENTS: decreased activity tolerance, decreased knowledge of condition, decreased knowledge of use of DME, decreased mobility, decreased ROM, and increased edema.   ACTIVITY LIMITATIONS: carrying, lifting, sleeping, bathing, dressing, and reach over head  PARTICIPATION LIMITATIONS: meal prep, cleaning, laundry, shopping, and community activity  PERSONAL FACTORS: Age and 1 comorbidity: ALND and radiation hx  are also affecting patient's functional outcome.   REHAB POTENTIAL: Excellent  CLINICAL DECISION MAKING: Evolving/moderate complexity  EVALUATION COMPLEXITY: Moderate  GOALS: Goals reviewed with patient? Yes  SHORT TERM GOALS: Target date: 04/08/23    Pt will be ind with self MLD for edema reduction Baseline: Goal status: In progress 2.  Pt will decrease volume to or less to demonstrate improving edema status Baseline:  Goal status: MET 1596  (8 ml more) 04/30/2023 3.  Pt will be evaluated and treated for shoulder with goals added as needed as instructed by MD during future visits.  Baseline:  Goal status:MET 04/04/2023  LONG TERM GOALS: Target date: 05/10/23  Pt will reduce to less than  volume difference between sides to demonstrate no active lymphedema Baseline:  Goal status: MET 04/29/2022 (1596 ml) 2.  Pt will obtain compression as needed for discharge depending on reduction Baseline:  Goal status: MET 04/11/2023   3. Pt will be able to perform AROM  elevation in supine to atleast 120 degrees without compensation  Goal Status; MET: 117 04/30/2023; 123 degrees - 05/30/23  4. Pt will be able to place 8 oz object on eye level shelf without UT compensation  GOALStatus: In progress; 05/30/23 - pt able to lift 1# half way to eye level shelf before compensation  5. Pt will be tolerant of resisted exs for improving UE  strength at 7 weeks post op Goal Status; PARTIALLY MET - 05/30/23 - pt is independent with current HEP of A/AA/ROM and isometric strength, has started progressing to theraband  6. Pt will be able to return to driving for greater independence Goal status: PROGRESSING - 05/30/23 - pt is able to drive close to home (grocery store and pharmacy) but has not been able to return to highway driving that will require quick turns and maneuvering she is not able to do yet  7. Pt will be able to achieve 80-90 degrees Lt shoulder flexion for increased ease with ADLs and washing hair.  Baseline: 05/30/23 - 64 degrees   Goal Status: NEW  PLAN:  PT FREQUENCY: 2-3x/week  PT DURATION:  4 weeks  PLANNED INTERVENTIONS: 97164- PT Re-evaluation, 97110-Therapeutic exercises, 97530- Therapeutic activity, 97112- Neuromuscular re-education, 97535- Self Care, and 02859- Manual therapy  PLAN FOR NEXT SESSION:  Medicare recert will be sent on Monday for request for more visits. Pt reducing freq to 2x/wk after this week. Review band exs and new forearms on wall; Review standing dowel exs prn, progress  strength, review and cont MLD prn; see Media section for protocol though physician has released her, next appt with surgeon 05/21/2022. Pt needs new script for flat knit but should wait to be  measured until fibrosis further decreases.   Berwyn Knights, PTA 06/01/23 12:16 PM Grayce Sheldon, PT 06/01/23 12:16 PM   05/14/23: Start with forearms on wall holding short yellow theraband in hands:  Pull down keeping elbow bent and squeezing shoulder blade towards spine at end of pull  2 sets of 5 each  2.   Inch forearms up wall keeping elbows in only as high as you can control by keeping the shoulder blade down  2 sets of 5 each  Next:  Throw long band over top of door, close door and pull down with Lt arm, when returning to start position keep shoulder down  Cancer Rehab [phone]

## 2023-06-04 ENCOUNTER — Ambulatory Visit: Payer: Medicare PPO

## 2023-06-04 DIAGNOSIS — I972 Postmastectomy lymphedema syndrome: Secondary | ICD-10-CM | POA: Diagnosis not present

## 2023-06-04 DIAGNOSIS — Z96612 Presence of left artificial shoulder joint: Secondary | ICD-10-CM

## 2023-06-04 DIAGNOSIS — M25612 Stiffness of left shoulder, not elsewhere classified: Secondary | ICD-10-CM

## 2023-06-04 DIAGNOSIS — Z4789 Encounter for other orthopedic aftercare: Secondary | ICD-10-CM | POA: Diagnosis not present

## 2023-06-04 NOTE — Therapy (Signed)
 OUTPATIENT PT UPPER EXTREMITY LYMPHEDEMA TREATMENT  Patient Name: Sarah Underwood MRN: [id number] DOB:09-12-1945, 78 y.o., female Today's Date: 06/04/2023  END OF SESSION:  PT End of Session - 06/04/23 1055     Visit Number 27    Number of Visits 37    Date for PT Re-Evaluation 07/11/23    Authorization Time Period new auth sent on 06/02/23; 12 visits approved 06/04/2023-07/12/2023    Authorization - Visit Number 1    Authorization - Number of Visits 12    Progress Note Due on Visit 39    PT Start Time 1100    PT Stop Time 1155    PT Time Calculation (min) 55 min    Activity Tolerance Patient tolerated treatment well    Behavior During Therapy WFL for tasks assessed/performed                 Past Medical History:  Diagnosis Date   Basal cell carcinoma 1994   right side of node   Benign positional vertigo    with rolling over    Celiac disease    Dysrhythmia    Generalized anxiety disorder    GERD (gastroesophageal reflux disease)    Graves disease 2007   thyroid  nodule   Hard of hearing    Heart murmur    History of breast cancer 1995   left - treated with mastectomy   History of radiation therapy 2001   37 sessions following recurrence of breast cancer in left chest wall   Hypertension    Knee pain, bilateral 12/14/2014   Major depressive disorder in remission    Mild persistent asthma, uncomplicated 06/29/2015   Nocturia    OA (osteoarthritis) of hip 09/08/2015   Ovarian mass    small solid nodule 8x28mm: Right ovary - stable since 2000, follow u/s do not show this.   Renal mass 2021   followed by Dr. Secundino Dach, [facility].  Having every 6 month follow up   Seasonal allergies    Stress fracture of metatarsal bone of left foot 12/15/2019   Vegetarian diet    Past Surgical History:  Procedure Laterality Date   APPENDECTOMY     basal cell removal  7/94   right nose   BREAST BIOPSY Right 1987   benign fibroadenoma   breast cancer recurrence Left 01/2000   at  site of left chest wall after previous mastectomy with removal and treatment with radiaton.   CARPAL TUNNEL RELEASE Right 05/08/2013   Procedure: RIGHT CARPAL TUNNEL RELEASE;  Surgeon: Amelie Baize., MD;  Location:  [facility];  Service: Orthopedics;  Laterality: Right;   CATARACT EXTRACTION     03/20/14 left eye, 12/15 right eye   COLPOSCOPY W/ BIOPSY / CURETTAGE  03/12/12   benign, but + HR HPV   EYE SURGERY     bilat cataract surgery    MASTECTOMY Left 3/95   Stage IIb 0/14 LN ER/ PR + with mastectomy - Dr. Loris Ros   MOHS SURGERY     right nare   OVARY SURGERY  1974   lt   REVERSE SHOULDER ARTHROPLASTY Left 02/26/2023   Procedure: REVERSE SHOULDER ARTHROPLASTY;  Surgeon: Janeth Medicus, MD;  Location[facility] OR;  Service: Orthopedics;  Laterality: Left;   TONSILLECTOMY AND ADENOIDECTOMY  1950   TOTAL HIP ARTHROPLASTY Right 09/08/2015   Procedure: TOTAL HIP ARTHROPLASTY ANTERIOR APPROACH;  Surgeon: Liliane Rei, MD;  Location: [facility] ORS;  Service: Orthopedics;  Laterality: Right;  Patient Active Problem List   Diagnosis Date Noted   Shoulder fracture 02/23/2023   Closed fracture of proximal end of left humerus, unspecified fracture morphology, initial encounter 02/22/2023   Thyroid  nodule 02/22/2023   Major depressive disorder in remission 03/29/2020   Generalized anxiety disorder    Benign positional vertigo    Hypertension    Heart murmur    Stress fracture of metatarsal bone of left foot 12/15/2019   History of revision of total replacement of right hip joint 11/26/2017   Leg edema, left 09/26/2016   Post-nasal drainage 03/30/2016   OA (osteoarthritis) of hip 09/08/2015   Mild persistent asthma, uncomplicated 06/29/2015   Hip pain, bilateral 12/14/2014   Knee pain, bilateral 12/14/2014   Postmenopausal atrophic vaginitis 02/23/2013   Graves disease 2007   History of radiation therapy 2001   History of breast cancer 1995   Basal cell carcinoma 1994     PCP: Barnetta Liberty, MD  REFERRING PROVIDER: Carter Clare, MD  REFERRING DIAG:  Diagnosis  C50.511,Z17.0 (ICD-10-CM) - Malignant neoplasm of lower-outer quadrant of right breast of female, estrogen receptor positive (HCC)  I89.0 (ICD-10-CM) - Lymphedema  Z47.89 orthopedic aftercare  S/P reverse total shoulder arthroplasty, left  Encounter for other orthopedic aftercare  Stiffness of left shoulder, not elsewhere classified  THERAPY DIAG:  Postmastectomy lymphedema  S/P reverse total shoulder arthroplasty, left  Encounter for other orthopedic aftercare  Stiffness of left shoulder, not elsewhere classified  ONSET DATE: 02/26/23  Rationale for Evaluation and Treatment: Rehabilitation  SUBJECTIVE                                                                                                                                                                                           SUBJECTIVE STATEMENT:  I had a rough day Saturday. On Thursday I reached funny to put up a dish and I got a bad cramp in my arm. It got really sore. Val did a lot of STW on Saturday from my therapy and the manual work. It started to resolve yesterday. All I did was movement. The back of my arm seemed to swell some, I wore the TG soft, but I can't get the velcro tight enough myself.  PERTINENT HISTORY: Recent fall with left arm fracture.  Surgery with Reverse TSR 02/26/23.  This is the same side as her mastectomy which was diagnosed in 1995 with chest wall recurrence in 2001.  No remaining lymph nodes.   Shoulder surgery: Dr. Carter Clare -See media section for post-op approved exercises and okay for compression therapy.    PAIN:  PAIN:  Are you having pain? Yes  NPRS scale: 0-1/10 Pain location: shoulder, and upper am Pain orientation: Left, Proximal, and Upper  PAIN TYPE: sore, muscle pain, cramping Pain description: intermittent  Aggravating factors: didn't watch my technique with Lt shoulder  A/ROM yesterday Relieving factors: massage and stretch    PRECAUTIONS: cleared for all ROM/activity of the shoulder per MD  RED FLAGS: None   WEIGHT BEARING RESTRICTIONS: No  FALLS:  Has patient fallen in last 6 months? Yes. Number of falls 1 - does not remember details of her fall. Just remembers she was going outside.   LIVING ENVIRONMENT: Lives with: lives alone Has a CNA for assistance at home.   OCCUPATION: Retired  LEISURE: Not currently exercising  HAND DOMINANCE : Right  PRIOR LEVEL OF FUNCTION: Independent  PATIENT GOALS: prevent chronic lymphedema after this fall   OBJECTIVE (FROM EVAL) Note: Objective measures were completed at Evaluation unless otherwise noted.  COGNITION:  Overall cognitive status: Within functional limits for tasks assessed   PALPATION: +2 pitting edema back of hand, forearm, below, to around the distal 1/3rd of the upper arm.   OBSERVATIONS / OTHER ASSESSMENTS: bruising all fingers and hand, elbow, and upper arm from surgery.  Skin tight and shiny from edema.     POSTURE: wearing sling upon arrival and leaving  UPPER EXTREMITY AROM/PROM: Just starting wrist and elbow AROM - did not measure objectively.  No shoulder ROM allowed.   05/30/23:Left UE AAROM elevation in supine=   LEFT UE AROM elevation (scaption) in supine: 123    LEFT ER AROM supine:27   LEFT IR AROM in standing: fingertips to medial glut fold and thumb reaches glut crease   LEFT UE FLEX in standing pt can briefly hold at 64 degrees when therapist assists with concentric contraction  04/04/2023 Left UE AAROM elevation in supine=   LEFT UE AROM elevation in supine: 100  04/11/2023   LEFT ER AROM supine:17   LEFT IR AROM supine:68 30 deg abd      IN SUPINE 04/30/2023 Left shoulder flex AROM in supine;  117 with elbow flexed to initiate   IR AROM  to stomach    ER  AROM 30 degrees at 30 degrees abduction     LYMPHEDEMA ASSESSMENTS:  LANDMARK RIGHT eval  At  axilla 30.4  15cm proximal to olecranon 29.4  10 cm proximal to olecranon process 28.3  Olecranon process 24.7  15cm proximal to ulnar styloid 22.1  10 cm proximal to ulnar styloid process 18.7  Just proximal to ulnar styloid process 16.2  Across hand at thumb web space 19.8  At base of 2nd digit 6.4  (Blank rows = not tested)  LANDMARK LEFT  eval LEFT 03/26/2023 LEFT 04/11/2023 LEFT  04/30/2023 LEFT 05/09/23 Left 05/23/23  At axilla 33 31 29.6 29.7 29.7 30.3  15cm proximal to olecranon 32.5 29.4 28.8 28.4 27.8 27.8  10 cm proximal to olecranon process 34.5 30.2 28.0 27.6 27.4 27.4  Olecranon process 30.5 26.6 24.0 23.4 23.3 22.9  15cm proximal to ulnar styloid  28.7 25.6 23.0 23.0 23.3 22.9  10 cm proximal to ulnar styloid process 26.5 23.4 20.5 21.1 21.9 22  Just proximal to ulnar styloid process 18.5 17.2 15.9 15.8 15.9 15.7  Across hand at thumb web space 20.4 18.8 19.2 19.0 18.4 18.8  At base of 2nd digit 7.0 6.1 5.85 6.0 5.9 5.9  (Blank rows = not tested)  Volume (at eval):          Length 7cm Rt:                          Lt:                      04/30/2023  1596 ml difference   PATIENT SURVEYS:  Quick Dash 68% limited EVAL 04/29/2022     34%   TODAY'S TREATMENT:                                                                                                                                         DATE:   06/04/2023 Pulleys x 2 min flexion and scaption to improve ROM Shoulder ranger x 4 min and scaption x 2 min with TC to depress scapula Finger ladder 5 times Left ebow flexion yellow x 10 Reach to eye level shelf;TC's by PT to encourage scapular depression, and pt assisting with 2 fingers in palm x 5-6 reps MANUAL P/ROM of Lt shoulder into flex, abd and er/IR all to tolerance with scapular depression by therapist STM with cocoa butter to Lt upper arm, lats, and deltoid muscles Assisted pt with velcro wrap and cover 06/01/23: Therapeutic  Exercises Pulleys for review to assess technique x 1 min into flex and scaption with cues to relax at top of motions as pt is compensating at that point UE Ranger into flex x 4 mins and VC's to decrease scapular compensation top of motion Finger Ladder into flex x 3 up to #29, then Lt scaption x 1 up to #29 Manual Therapy  P/ROM of Lt shoulder into flex, abd and er/IR all to tolerance with scapular depression by therapist STM with cocoa butter to Lt upper arm, lats,UT and deltoid muscles  05/30/23: Self Care Discussed pts current functional status and progress towards goals for MD renewal.  Therapeutic Exercises UE Ranger x 4 mins for flex and then x 2 mins for scaption as able, pt able to return good demo of correct technique with decreased scapular compensations Finger Ladder for flex x 4 up to #30.  Therapeutic Activity Had pt practice placing 1# weight on shelf, then tried reaching forward with weight multiple trials of each with VC's to be aware of compensations. Pt reports feeling good stretch in trunk with reaching with these activities.  Manual Therapy P/ROM of Lt shoulder into flex, abd and er/IR all to tolerance with scapular depression by therapist  05/28/23: Manual Therapy MLD to Lt UE as follows: Short neck, superficial and deep abdominals, Rt axillary nodes, anterior inter-axillary anasotmosis, Lt inguinal nodes, Lt axillo-inguinal anastomosis, Lt UE working from proximal to distal  down to forearm but spent more time focusing on antecubital fossa and distal upper arm where most fibrosis, then retracing all steps P/ROM of Lt shoulder into flex, abd and er/IR all to tolerance with scapular depression by therapist STM in Rt  S/L with cocoa butter to medial scapular border and UT and briefly to upper arm when back in supine  05/23/23: Therapeutic Exercises Pulleys into flex and scaption x 2 mins each UE Ranger on wall x 15 into flex, trial of scaption x 3 reps but pt felt unbalanced  so stopped scaption Finger ladder for LT UE: Flex up to # 28 x 6 reps, and then Scaption up to # 26-27 x 5 reps. Abd x 3 reps up to # 20. Pt with excellent technique UBE Level 1, 1 min each direction x 2 mins total with PTA assessing technique during; starting explaining "low and slow" progression for when pt gets back to exercising at her gym. Also that she should exercises wearing her compression.  Yellow theraband for Lt ext, flex, and punch (flex with elbow bent) x 5-7 each returning therapist demo Manual Therapy P/ROM briefly at end of session to Lt shoulder into flex, and and er/IR x 5 mins as time allowed   05/21/2023 Pt s/p recent fall last Friday; Xrays negative,treatment modified Pt wearing TG soft on Left UE  Alternating isometrics gentle for IR and ER 3 x 20 AROM IR and ER, elbow flex and ext x 10 ea AA shoulder flexion x 10 PROM left shoulder flex, scaption, abd, IR and ER  AROM flex 50 -90 degrees x 8, x 5 MLD to Lt UE: Short neck, 5 breaths, Rt axillary nodes, anterior inter-axillary anasotmosis, Lt inguinal nodes, Lt axillo-inguinal anastomosis,  and  Left UE working from proximal to distal then retracing all steps and ending with LN's  05/18/2023 Measured length for sleeve 45 cm;requires long Val PTA came in and discussed sleeve info with pt. Awaiting script Tried pulleys but held secondary to difficulty getting it started Scapular retraction yellow band x 10, emphasis on retraction with depression Long yellow band over door with pt pulling down and then having assist of band to elevate while maintaining scapular depression Alternating isometrics IR/ER 2 x 20, rhythmic stabs at 90 deg flexion 1 x 20 secs Supine alphabet x 1 Supine AROM with elbow straight and raising from approx 30 degrees to 90 degrees with scapular depression x 5 with good control Supine wand for shoulder flexion to improve ROM, and to focus on scapular depression x 5. Updated HEP Finger ladder x 2   Shoulder Ranger with mild A of PT for elevation x 4-5 reps. Pt did a good job keeping her left UT quiet   PATIENT EDUCATION:  Access Code: [email or web address] URL: [email or web address] Date: 04/13/2023 Prepared by: Sharon December  Exercises - Standing Isometric Shoulder Internal Rotation at Doorway  - 1 x daily - 7 x weekly - 3 sets - 10 reps - Standing Isometric Shoulder External Rotation with Doorway  - 1 x daily - 7 x weekly - 1 sets - 5-10 reps - Isometric Shoulder Flexion at Wall  - 1 x daily - 7 x weekly - 1 sets - 5-10 reps - 5 hold - Isometric Shoulder Extension at Wall  - 1 x daily - 7 x weekly - 1 sets - 5-10 reps - Isometric Shoulder Abduction at Wall  - 1 x daily - 7 x weekly - 1 sets - 5-10 reps   05/02/23: Education details: Forearms on wall for postural strength Person educated: Patient Education method: Explanation, Demonstration, and Verbal cues, and tactile cues, handout issued (at 05/16/23 session) Education comprehension: verbalized understanding, returned demonstration, tactile and VC's and pt will benefit from further review  HOME EXERCISE PROGRAM: Scapular retraction, shoulder extension yellow band x 10 Supine alphabet Isometrics for Lt shoulder Standing dowel exercises Self MLD Forearms on wall for scapular strength with yellow theraband  ASSESSMENT:  CLINICAL IMPRESSION: Pt had greater difficulty with scaption on shoulder ranger so decreased time. Tried reaching to eye level shelf, no wt with PT helping to depress scapula but still very difficult for her.. Suggested pt try on lower shelf or stack a large pot on counter and work in that ROM with focus on scapula and no wt.  OBJECTIVE IMPAIRMENTS: decreased activity tolerance, decreased knowledge of condition, decreased knowledge of use of DME, decreased mobility, decreased ROM, and increased edema.   ACTIVITY LIMITATIONS: carrying, lifting, sleeping, bathing, dressing, and reach over  head  PARTICIPATION LIMITATIONS: meal prep, cleaning, laundry, shopping, and community activity  PERSONAL FACTORS: Age and 1 comorbidity: ALND and radiation hx  are also affecting patient's functional outcome.   REHAB POTENTIAL: Excellent  CLINICAL DECISION MAKING: Evolving/moderate complexity  EVALUATION COMPLEXITY: Moderate  GOALS: Goals reviewed with patient? Yes  SHORT TERM GOALS: Target date: 04/08/23    Pt will be ind with self MLD for edema reduction Baseline: Goal status: In progress 2.  Pt will decrease volume to or less to demonstrate improving edema status Baseline:  Goal status: MET 1596  (8 ml more) 04/30/2023 3.  Pt will be evaluated and treated for shoulder with goals added as needed as instructed by MD during future visits.  Baseline:  Goal status:MET 04/04/2023  LONG TERM GOALS: Target date: 05/10/23  Pt will reduce to less than volume difference between sides to demonstrate no active lymphedema Baseline:  Goal status: MET 04/29/2022 (1596 ml) 2.  Pt will obtain compression as needed for discharge depending on reduction Baseline:  Goal status: MET 04/11/2023   3. Pt will be able to perform AROM  elevation in supine to atleast 120 degrees without compensation  Goal Status; MET: 117 04/30/2023; 123 degrees - 05/30/23  4. Pt will be able to place 8 oz object on eye level shelf without UT compensation  GOALStatus: In progress; 05/30/23 - pt able to lift 1# half way to eye level shelf before compensation  5. Pt will be tolerant of resisted exs for improving UE strength at 7 weeks post op Goal Status; PARTIALLY MET - 05/30/23 - pt is independent with current HEP of A/AA/ROM and isometric strength, has started progressing to theraband  6. Pt will be able to return to driving for greater independence Goal status: PROGRESSING - 05/30/23 - pt is able to drive close to home (grocery store and pharmacy) but has not been able to return to highway driving that will  require quick turns and maneuvering she is not able to do yet  7. Pt will be able to achieve 80-90 degrees Lt shoulder flexion for increased ease with ADLs and washing hair.  Baseline: 05/30/23 - 64 degrees   Goal Status: NEW  PLAN:  PT FREQUENCY: 2-3x/week  PT DURATION:  4 weeks  PLANNED INTERVENTIONS: 97164- PT Re-evaluation, 97110-Therapeutic exercises, 97530- Therapeutic activity, 97112- Neuromuscular re-education, 97535- Self Care, and 84696- Manual therapy  PLAN FOR NEXT SESSION:  Medicare recert will be sent on Monday for request for more visits. Pt reducing freq to 2x/wk after this week. Review band exs and new forearms on wall; Review standing dowel exs prn, progress  strength, review and cont MLD prn; see Media section for protocol though physician has released her, next appt with  surgeon 05/21/2022. Pt needs new script for flat knit but should wait to be measured until fibrosis further decreases.   Roslynn Coombes, PTA 06/04/23 11:58 AM Sharon December, PT 06/04/23 11:58 AM   05/14/23: Start with forearms on wall holding short yellow theraband in hands:  Pull down keeping elbow bent and squeezing shoulder blade towards spine at end of pull  2 sets of 5 each  2.   Inch forearms up wall keeping elbows in only as high as you can control by keeping the shoulder blade down  2 sets of 5 each  Next:  Throw long band over top of door, close door and pull down with Lt arm, when returning to start position keep shoulder down  Cancer Rehab [phone]

## 2023-06-05 DIAGNOSIS — M25551 Pain in right hip: Secondary | ICD-10-CM | POA: Diagnosis not present

## 2023-06-07 ENCOUNTER — Ambulatory Visit: Payer: Medicare PPO

## 2023-06-07 DIAGNOSIS — M25612 Stiffness of left shoulder, not elsewhere classified: Secondary | ICD-10-CM

## 2023-06-07 DIAGNOSIS — Z96612 Presence of left artificial shoulder joint: Secondary | ICD-10-CM | POA: Diagnosis not present

## 2023-06-07 DIAGNOSIS — I972 Postmastectomy lymphedema syndrome: Secondary | ICD-10-CM | POA: Diagnosis not present

## 2023-06-07 DIAGNOSIS — Z4789 Encounter for other orthopedic aftercare: Secondary | ICD-10-CM | POA: Diagnosis not present

## 2023-06-07 NOTE — Therapy (Signed)
OUTPATIENT PT UPPER EXTREMITY LYMPHEDEMA TREATMENT  Patient Name: Sarah Underwood MRN: [id number] DOB:[birth year], 78 y.o., female Today's Date: 06/07/2023  END OF SESSION:  PT End of Session - 06/07/23 0909     Visit Number 28    Number of Visits 37    Date for PT Re-Evaluation 07/11/23    Authorization Time Period new auth sent on 06/02/23; 12 visits approved 06/04/2023-07/12/2023    Authorization - Visit Number 2    Authorization - Number of Visits 12    Progress Note Due on Visit 39    PT Start Time 0905    PT Stop Time 1000    PT Time Calculation (min) 55 min    Activity Tolerance Patient tolerated treatment well    Behavior During Therapy [facility] for tasks assessed/performed             Past Medical History:  Diagnosis Date   Basal cell carcinoma 1994   right side of node   Benign positional vertigo    with rolling over    Celiac disease    Dysrhythmia    Generalized anxiety disorder    GERD (gastroesophageal reflux disease)    Graves disease 2007   thyroid nodule   Hard of hearing    Heart murmur    History of breast cancer 1995   left - treated with mastectomy   History of radiation therapy 2001   37 sessions following recurrence of breast cancer in left chest wall   Hypertension    Knee pain, bilateral 12/14/2014   Major depressive disorder in remission    Mild persistent asthma, uncomplicated 06/29/2015   Nocturia    OA (osteoarthritis) of hip 09/08/2015   Ovarian mass    small solid nodule 8x31mm: Right ovary - stable since 2000, follow u/s do not show this.   Renal mass 2021   followed by Dr. Berneice Heinrich, [facility].  Having every 6 month follow up   Seasonal allergies    Stress fracture of metatarsal bone of left foot 12/15/2019   Vegetarian diet    Past Surgical History:  Procedure Laterality Date   APPENDECTOMY     basal cell removal  7/94   right nose   BREAST BIOPSY Right 1987   benign fibroadenoma   breast cancer recurrence Left 01/2000   at site of  left chest wall after previous mastectomy with removal and treatment with radiaton.   CARPAL TUNNEL RELEASE Right 05/08/2013   Procedure: RIGHT CARPAL TUNNEL RELEASE;  Surgeon: Wyn Forster., MD;  Location: [facility];  Service: Orthopedics;  Laterality: Right;   CATARACT EXTRACTION     03/20/14 left eye, 12/15 right eye   COLPOSCOPY W/ BIOPSY / CURETTAGE  03/12/12   benign, but + HR HPV   EYE SURGERY     bilat cataract surgery    MASTECTOMY Left 3/95   Stage IIb 0/14 LN ER/ PR + with mastectomy - Dr. Mardella Layman   MOHS SURGERY     right nare   OVARY SURGERY  1974   lt   REVERSE SHOULDER ARTHROPLASTY Left 02/26/2023   Procedure: REVERSE SHOULDER ARTHROPLASTY;  Surgeon: Yolonda Kida, MD;  Location: [facility] OR;  Service: Orthopedics;  Laterality: Left;   TONSILLECTOMY AND ADENOIDECTOMY  1950   TOTAL HIP ARTHROPLASTY Right 09/08/2015   Procedure: TOTAL HIP ARTHROPLASTY ANTERIOR APPROACH;  Surgeon: Ollen Gross, MD;  Location: [facility];  Service: Orthopedics;  Laterality: Right;   Patient Active Problem List  Diagnosis Date Noted   Shoulder fracture 02/23/2023   Closed fracture of proximal end of left humerus, unspecified fracture morphology, initial encounter 02/22/2023   Thyroid nodule 02/22/2023   Major depressive disorder in remission 03/29/2020   Generalized anxiety disorder    Benign positional vertigo    Hypertension    Heart murmur    Stress fracture of metatarsal bone of left foot 12/15/2019   History of revision of total replacement of right hip joint 11/26/2017   Leg edema, left 09/26/2016   Post-nasal drainage 03/30/2016   OA (osteoarthritis) of hip 09/08/2015   Mild persistent asthma, uncomplicated 06/29/2015   Hip pain, bilateral 12/14/2014   Knee pain, bilateral 12/14/2014   Postmenopausal atrophic vaginitis 02/23/2013   Graves disease 2007   History of radiation therapy 2001   History of breast cancer 1995   Basal cell carcinoma 1994    PCP:  Alysia Penna, MD  REFERRING PROVIDER: Duwayne Heck, MD  REFERRING DIAG:  Diagnosis  C50.511,Z17.0 (ICD-10-CM) - Malignant neoplasm of lower-outer quadrant of right breast of female, estrogen receptor positive (HCC)  I89.0 (ICD-10-CM) - Lymphedema  Z47.89 orthopedic aftercare  S/P reverse total shoulder arthroplasty, left  Encounter for other orthopedic aftercare  Stiffness of left shoulder, not elsewhere classified  THERAPY DIAG:  Postmastectomy lymphedema  S/P reverse total shoulder arthroplasty, left  Encounter for other orthopedic aftercare  Stiffness of left shoulder, not elsewhere classified  ONSET DATE: 02/26/23  Rationale for Evaluation and Treatment: Rehabilitation  SUBJECTIVE                                                                                                                                                                                           SUBJECTIVE STATEMENT:  I saw the other PT for my Rt hip pain and she has me using a SPC. I can tell I started having some sharp pains in my anterior shoulder with that despite using the cane correctly.   PERTINENT HISTORY: Recent fall with left arm fracture.  Surgery with Reverse TSR 02/26/23.  This is the same side as her mastectomy which was diagnosed in 1995 with chest wall recurrence in 2001.  No remaining lymph nodes.   Shoulder surgery: Dr. Duwayne Heck -See media section for post-op approved exercises and okay for compression therapy.    PAIN:  PAIN:  Are you having pain? Not currently but new Lt anterior shoulder pain with SPC use NPRS scale: 0-3/10 Pain location: anterior shoulder/bicep tendon Pain orientation: Left, Proximal, and Upper  PAIN TYPE: pinching, cramping Pain description: intermittent  Aggravating factors: started using SPC for Rt hip pain Relieving factors: massage  and stretch    PRECAUTIONS: cleared for all ROM/activity of the shoulder per MD  RED FLAGS: None   WEIGHT  BEARING RESTRICTIONS: No  FALLS:  Has patient fallen in last 6 months? Yes. Number of falls 1 - does not remember details of her fall. Just remembers she was going outside.   LIVING ENVIRONMENT: Lives with: lives alone Has a CNA for assistance at home.   OCCUPATION: Retired  LEISURE: Not currently exercising  HAND DOMINANCE : Right  PRIOR LEVEL OF FUNCTION: Independent  PATIENT GOALS: prevent chronic lymphedema after this fall   OBJECTIVE (FROM EVAL) Note: Objective measures were completed at Evaluation unless otherwise noted.  COGNITION:  Overall cognitive status: Within functional limits for tasks assessed   PALPATION: +2 pitting edema back of hand, forearm, below, to around the distal 1/3rd of the upper arm.   OBSERVATIONS / OTHER ASSESSMENTS: bruising all fingers and hand, elbow, and upper arm from surgery.  Skin tight and shiny from edema.     POSTURE: wearing sling upon arrival and leaving  UPPER EXTREMITY AROM/PROM: Just starting wrist and elbow AROM - did not measure objectively.  No shoulder ROM allowed.   05/30/23:Left UE AAROM elevation in supine=   LEFT UE AROM elevation (scaption) in supine: 123    LEFT ER AROM supine:27   LEFT IR AROM in standing: fingertips to medial glut fold and thumb reaches glut crease   LEFT UE FLEX in standing pt can briefly hold at 64 degrees when therapist assists with concentric contraction  04/04/2023 Left UE AAROM elevation in supine=   LEFT UE AROM elevation in supine: 100  04/11/2023   LEFT ER AROM supine:17   LEFT IR AROM supine:68 30 deg abd      IN SUPINE 04/30/2023 Left shoulder flex AROM in supine;  117 with elbow flexed to initiate   IR AROM  to stomach    ER  AROM 30 degrees at 30 degrees abduction     LYMPHEDEMA ASSESSMENTS:  LANDMARK RIGHT eval  At axilla 30.4  15cm proximal to olecranon 29.4  10 cm proximal to olecranon process 28.3  Olecranon process 24.7  15cm proximal to ulnar styloid 22.1  10 cm  proximal to ulnar styloid process 18.7  Just proximal to ulnar styloid process 16.2  Across hand at thumb web space 19.8  At base of 2nd digit 6.4  (Blank rows = not tested)  LANDMARK LEFT  eval LEFT 03/26/2023 LEFT 04/11/2023 LEFT  04/30/2023 LEFT 05/09/23 Left 05/23/23  At axilla 33 31 29.6 29.7 29.7 30.3  15cm proximal to olecranon 32.5 29.4 28.8 28.4 27.8 27.8  10 cm proximal to olecranon process 34.5 30.2 28.0 27.6 27.4 27.4  Olecranon process 30.5 26.6 24.0 23.4 23.3 22.9  15cm proximal to ulnar styloid  28.7 25.6 23.0 23.0 23.3 22.9  10 cm proximal to ulnar styloid process 26.5 23.4 20.5 21.1 21.9 22  Just proximal to ulnar styloid process 18.5 17.2 15.9 15.8 15.9 15.7  Across hand at thumb web space 20.4 18.8 19.2 19.0 18.4 18.8  At base of 2nd digit 7.0 6.1 5.85 6.0 5.9 5.9  (Blank rows = not tested)  Volume (at eval):          Length 7cm Rt:                         Lt:   04/30/2023  1596 ml difference   PATIENT SURVEYS:  Quick Dash 68% limited EVAL 04/29/2022     34%   TODAY'S TREATMENT:                                                                                                                                         DATE:  06/07/23: Therapeutic Exercises Pt demonstrated Lt bicep curls with yellow theraband, educated her it's also important to strengthen the tricep  Tricep extension with yell band over top of door x 10 reps Modified prone (bed elevated so pt could lay upper body on bed as unable to lay prone) for Lt UE ext with scap retract at end motion x 10, then Lt UE abd as able and tactile cuing for correct scapular engagement without UT x 10 UE Ranger for LT UE flex focusing on decreasing scapular compensation x 3 mins Finger Ladder into Lt YUE flex x 4 up to #29 Lt tricep ext again with yellow band over door x 20 with tactile cuing for correct UE position and Vcs for tricep squeeze at end motion, then Lt bil UE ext in  larger ROM x 20 Manual Therapy P/ROM of Lt shoulder into flex, abd and er/IR all to tolerance with scapular depression by therapist STM with cocoa butter to Lt upper arm, lats, and deltoid muscles  06/04/2023 Pulleys x 2 min flexion and scaption to improve ROM Shoulder ranger x 4 min and scaption x 2 min with TC to depress scapula Finger ladder 5 times Left ebow flexion yellow x 10 Reach to eye level shelf;TC's by PT to encourage scapular depression, and pt assisting with 2 fingers in palm x 5-6 reps MANUAL P/ROM of Lt shoulder into flex, abd and er/IR all to tolerance with scapular depression by therapist STM with cocoa butter to Lt upper arm, lats, and deltoid muscles Assisted pt with velcro wrap and cover  06/01/23: Therapeutic Exercises Pulleys for review to assess technique x 1 min into flex and scaption with cues to relax at top of motions as pt is compensating at that point UE Ranger into flex x 4 mins and VC's to decrease scapular compensation top of motion Finger Ladder into flex x 3 up to #29, then Lt scaption x 1 up to #29 Manual Therapy  P/ROM of Lt shoulder into flex, abd and er/IR all to tolerance with scapular depression by therapist STM with cocoa butter to Lt upper arm, lats,UT and deltoid muscles      PATIENT EDUCATION:  Access Code: [email or web address] URL: [email or web address] Date: 04/13/2023 Prepared by: Alvira Monday  Exercises - Standing Isometric Shoulder Internal Rotation at Doorway  - 1 x daily - 7 x weekly - 3 sets - 10 reps - Standing Isometric Shoulder External Rotation with Doorway  - 1 x daily - 7 x weekly - 1 sets - 5-10 reps - Isometric Shoulder Flexion  at Wall  - 1 x daily - 7 x weekly - 1 sets - 5-10 reps - 5 hold - Isometric Shoulder Extension at Wall  - 1 x daily - 7 x weekly - 1 sets - 5-10 reps - Isometric Shoulder Abduction at Wall  - 1 x daily - 7 x weekly - 1 sets - 5-10 reps   05/02/23: Education details: Tricep ext with band over  top of door Person educated: Patient Education method: Explanation, Demonstration, and Verbal cues, and tactile cues Education comprehension: verbalized understanding, returned demonstration, tactile and VC's   HOME EXERCISE PROGRAM: Scapular retraction, shoulder extension yellow band x 10 Supine alphabet Isometrics for Lt shoulder Standing dowel exercises Self MLD Forearms on wall for scapular strength with yellow theraband Lt tricep ext with yellow theraband over door  ASSESSMENT:  CLINICAL IMPRESSION: Added tricep extension with scapular engagement and modified prone Lt UE A/ROM for scapular strength. Then continued with AA/ROM focusing on decreasing scap compensation and manual therapies to decrease muscle tightness that's limiting to Lt shoulder ROMs.   OBJECTIVE IMPAIRMENTS: decreased activity tolerance, decreased knowledge of condition, decreased knowledge of use of DME, decreased mobility, decreased ROM, and increased edema.   ACTIVITY LIMITATIONS: carrying, lifting, sleeping, bathing, dressing, and reach over head  PARTICIPATION LIMITATIONS: meal prep, cleaning, laundry, shopping, and community activity  PERSONAL FACTORS: Age and 1 comorbidity: ALND and radiation hx  are also affecting patient's functional outcome.   REHAB POTENTIAL: Excellent  CLINICAL DECISION MAKING: Evolving/moderate complexity  EVALUATION COMPLEXITY: Moderate  GOALS: Goals reviewed with patient? Yes  SHORT TERM GOALS: Target date: 04/08/23    Pt will be ind with self MLD for edema reduction Baseline: Goal status: In progress 2.  Pt will decrease volume to or less to demonstrate improving edema status Baseline:  Goal status: MET 1596  (8 ml more) 04/30/2023 3.  Pt will be evaluated and treated for shoulder with goals added as needed as instructed by MD during future visits.  Baseline:  Goal status:MET 04/04/2023  LONG TERM GOALS: Target date: 05/10/23  Pt will reduce to less than  volume difference between sides to demonstrate no active lymphedema Baseline:  Goal status: MET 04/29/2022 (1596 ml) 2.  Pt will obtain compression as needed for discharge depending on reduction Baseline:  Goal status: MET 04/11/2023   3. Pt will be able to perform AROM  elevation in supine to atleast 120 degrees without compensation  Goal Status; MET: 117 04/30/2023; 123 degrees - 05/30/23  4. Pt will be able to place 8 oz object on eye level shelf without UT compensation  GOALStatus: In progress; 05/30/23 - pt able to lift 1# half way to eye level shelf before compensation  5. Pt will be tolerant of resisted exs for improving UE strength at 7 weeks post op Goal Status; PARTIALLY MET - 05/30/23 - pt is independent with current HEP of A/AA/ROM and isometric strength, has started progressing to theraband  6. Pt will be able to return to driving for greater independence Goal status: PROGRESSING - 05/30/23 - pt is able to drive close to home (grocery store and pharmacy) but has not been able to return to highway driving that will require quick turns and maneuvering she is not able to do yet  7. Pt will be able to achieve 80-90 degrees Lt shoulder flexion for increased ease with ADLs and washing hair.  Baseline: 05/30/23 - 64 degrees   Goal Status: NEW  PLAN:  PT FREQUENCY:  2-3x/week  PT DURATION:  4 weeks  PLANNED INTERVENTIONS: 97164- PT Re-evaluation, 97110-Therapeutic exercises, 97530- Therapeutic activity, 97112- Neuromuscular re-education, 97535- Self Care, and 16109- Manual therapy  PLAN FOR NEXT SESSION:  Review band exs and new forearms on wall;  progress Lt UE and scapula strength, review and cont MLD prn; Pt needs new script for flat knit but should wait to be measured until fibrosis further decreases.   Berna Spare, PTA 06/07/23 10:07 AM    05/14/23: Start with forearms on wall holding short yellow theraband in hands:  Pull down keeping elbow bent and squeezing  shoulder blade towards spine at end of pull  2 sets of 5 each  2.   Inch forearms up wall keeping elbows in only as high as you can control by keeping the shoulder blade down  2 sets of 5 each  Next:  Throw long band over top of door, close door and pull down with Lt arm, when returning to start position keep shoulder down  Cancer Rehab [phone]

## 2023-06-11 ENCOUNTER — Ambulatory Visit: Payer: Medicare PPO

## 2023-06-11 DIAGNOSIS — Z96612 Presence of left artificial shoulder joint: Secondary | ICD-10-CM | POA: Diagnosis not present

## 2023-06-11 DIAGNOSIS — K219 Gastro-esophageal reflux disease without esophagitis: Secondary | ICD-10-CM | POA: Diagnosis not present

## 2023-06-11 DIAGNOSIS — Z4789 Encounter for other orthopedic aftercare: Secondary | ICD-10-CM | POA: Diagnosis not present

## 2023-06-11 DIAGNOSIS — E1129 Type 2 diabetes mellitus with other diabetic kidney complication: Secondary | ICD-10-CM | POA: Diagnosis not present

## 2023-06-11 DIAGNOSIS — E785 Hyperlipidemia, unspecified: Secondary | ICD-10-CM | POA: Diagnosis not present

## 2023-06-11 DIAGNOSIS — M8580 Other specified disorders of bone density and structure, unspecified site: Secondary | ICD-10-CM | POA: Diagnosis not present

## 2023-06-11 DIAGNOSIS — M25612 Stiffness of left shoulder, not elsewhere classified: Secondary | ICD-10-CM

## 2023-06-11 DIAGNOSIS — I10 Essential (primary) hypertension: Secondary | ICD-10-CM | POA: Diagnosis not present

## 2023-06-11 DIAGNOSIS — I972 Postmastectomy lymphedema syndrome: Secondary | ICD-10-CM | POA: Diagnosis not present

## 2023-06-11 DIAGNOSIS — M170 Bilateral primary osteoarthritis of knee: Secondary | ICD-10-CM | POA: Diagnosis not present

## 2023-06-11 DIAGNOSIS — R7301 Impaired fasting glucose: Secondary | ICD-10-CM | POA: Diagnosis not present

## 2023-06-11 DIAGNOSIS — G64 Other disorders of peripheral nervous system: Secondary | ICD-10-CM | POA: Diagnosis not present

## 2023-06-11 DIAGNOSIS — C50912 Malignant neoplasm of unspecified site of left female breast: Secondary | ICD-10-CM | POA: Diagnosis not present

## 2023-06-11 NOTE — Therapy (Signed)
 OUTPATIENT PT UPPER EXTREMITY LYMPHEDEMA TREATMENT  Patient Name: Sarah Underwood MRN: [id number] DOB:23-Apr-1946, 78 y.o., female Today's Date: 06/11/2023  END OF SESSION:  PT End of Session - 06/11/23 1110     Visit Number 29    Number of Visits 37    Date for PT Re-Evaluation 07/11/23    Authorization Type [employer] Auth needed    Authorization Time Period new auth sent on 06/02/23; 12 visits approved 06/04/2023-07/12/2023    Authorization - Visit Number 3    Authorization - Number of Visits 12    Progress Note Due on Visit 39    PT Start Time 1105    PT Stop Time 1200    PT Time Calculation (min) 55 min    Activity Tolerance Patient tolerated treatment well    Behavior During Therapy WFL for tasks assessed/performed             Past Medical History:  Diagnosis Date   Basal cell carcinoma 1994   right side of node   Benign positional vertigo    with rolling over    Celiac disease    Dysrhythmia    Generalized anxiety disorder    GERD (gastroesophageal reflux disease)    Graves disease 2007   thyroid nodule   Hard of hearing    Heart murmur    History of breast cancer 1995   left - treated with mastectomy   History of radiation therapy 2001   37 sessions following recurrence of breast cancer in left chest wall   Hypertension    Knee pain, bilateral 12/14/2014   Major depressive disorder in remission    Mild persistent asthma, uncomplicated 06/29/2015   Nocturia    OA (osteoarthritis) of hip 09/08/2015   Ovarian mass    small solid nodule 8x20mm: Right ovary - stable since 2000, follow u/s do not show this.   Renal mass 2021   followed by Dr. Berneice Heinrich, [facility].  Having every 6 month follow up   Seasonal allergies    Stress fracture of metatarsal bone of left foot 12/15/2019   Vegetarian diet    Past Surgical History:  Procedure Laterality Date   APPENDECTOMY     basal cell removal  7/94   right nose   BREAST BIOPSY Right 1987   benign fibroadenoma   breast  cancer recurrence Left 01/2000   at site of left chest wall after previous mastectomy with removal and treatment with radiaton.   CARPAL TUNNEL RELEASE Right 05/08/2013   Procedure: RIGHT CARPAL TUNNEL RELEASE;  Surgeon: Wyn Forster., MD;  Location: [facility];  Service: Orthopedics;  Laterality: Right;   CATARACT EXTRACTION     03/20/14 left eye, 12/15 right eye   COLPOSCOPY W/ BIOPSY / CURETTAGE  03/12/12   benign, but + HR HPV   EYE SURGERY     bilat cataract surgery    MASTECTOMY Left 3/95   Stage IIb 0/14 LN ER/ PR + with mastectomy - Dr. Mardella Layman   MOHS SURGERY     right nare   OVARY SURGERY  1974   lt   REVERSE SHOULDER ARTHROPLASTY Left 02/26/2023   Procedure: REVERSE SHOULDER ARTHROPLASTY;  Surgeon: Yolonda Kida, MD;  Location: [facility];  Service: Orthopedics;  Laterality: Left;   TONSILLECTOMY AND ADENOIDECTOMY  1950   TOTAL HIP ARTHROPLASTY Right 09/08/2015   Procedure: TOTAL HIP ARTHROPLASTY ANTERIOR APPROACH;  Surgeon: Ollen Gross, MD;  Location: [facility];  Service: Orthopedics;  Laterality: Right;   Patient Active Problem List   Diagnosis Date Noted   Shoulder fracture 02/23/2023   Closed fracture of proximal end of left humerus, unspecified fracture morphology, initial encounter 02/22/2023   Thyroid nodule 02/22/2023   Major depressive disorder in remission 03/29/2020   Generalized anxiety disorder    Benign positional vertigo    Hypertension    Heart murmur    Stress fracture of metatarsal bone of left foot 12/15/2019   History of revision of total replacement of right hip joint 11/26/2017   Leg edema, left 09/26/2016   Post-nasal drainage 03/30/2016   OA (osteoarthritis) of hip 09/08/2015   Mild persistent asthma, uncomplicated 06/29/2015   Hip pain, bilateral 12/14/2014   Knee pain, bilateral 12/14/2014   Postmenopausal atrophic vaginitis 02/23/2013   Graves disease 2007   History of radiation therapy 2001   History of breast cancer  1995   Basal cell carcinoma 1994    PCP: Alysia Penna, MD  REFERRING PROVIDER: Duwayne Heck, MD  REFERRING DIAG:  Diagnosis  C50.511,Z17.0 (ICD-10-CM) - Malignant neoplasm of lower-outer quadrant of right breast of female, estrogen receptor positive (HCC)  I89.0 (ICD-10-CM) - Lymphedema  Z47.89 orthopedic aftercare  S/P reverse total shoulder arthroplasty, left  Encounter for other orthopedic aftercare  Stiffness of left shoulder, not elsewhere classified  THERAPY DIAG:  Postmastectomy lymphedema  S/P reverse total shoulder arthroplasty, left  Encounter for other orthopedic aftercare  Stiffness of left shoulder, not elsewhere classified  ONSET DATE: 02/26/23  Rationale for Evaluation and Treatment: Rehabilitation  SUBJECTIVE                                                                                                                                                                                           SUBJECTIVE STATEMENT:  I just got cortisone shots in both my knees this morning and I can tell they already are feeling a bit better. The doctor suggested I switch arms with using my cane so as to limit the irritation I've been feeling to my Lt shoulder. I didn't get to do my exercises hardly at all this weekend because I was having a GI flare up from what felt like a gluten flare up.  PERTINENT HISTORY: Recent fall with left arm fracture.  Surgery with Reverse TSR 02/26/23.  This is the same side as her mastectomy which was diagnosed in 1995 with chest wall recurrence in 2001.  No remaining lymph nodes.   Shoulder surgery: Dr. Duwayne Heck -See media section for post-op approved exercises and okay for compression therapy.    PAIN:  PAIN:  Are you having pain? Not  currently but new Lt anterior shoulder pain with SPC use NPRS scale: 4/10 Pain location: anterior shoulder/bicep tendon Pain orientation: Left, Proximal, and Upper  PAIN TYPE: pinching, cramping Pain  description: intermittent  Aggravating factors: started using SPC for Rt hip pain Relieving factors: massage and stretch    PRECAUTIONS: cleared for all ROM/activity of the shoulder per MD  RED FLAGS: None   WEIGHT BEARING RESTRICTIONS: No  FALLS:  Has patient fallen in last 6 months? Yes. Number of falls 1 - does not remember details of her fall. Just remembers she was going outside.   LIVING ENVIRONMENT: Lives with: lives alone Has a CNA for assistance at home.   OCCUPATION: Retired  LEISURE: Not currently exercising  HAND DOMINANCE : Right  PRIOR LEVEL OF FUNCTION: Independent  PATIENT GOALS: prevent chronic lymphedema after this fall   OBJECTIVE (FROM EVAL) Note: Objective measures were completed at Evaluation unless otherwise noted.  COGNITION:  Overall cognitive status: Within functional limits for tasks assessed   PALPATION: +2 pitting edema back of hand, forearm, below, to around the distal 1/3rd of the upper arm.   OBSERVATIONS / OTHER ASSESSMENTS: bruising all fingers and hand, elbow, and upper arm from surgery.  Skin tight and shiny from edema.     POSTURE: wearing sling upon arrival and leaving  UPPER EXTREMITY AROM/PROM: Just starting wrist and elbow AROM - did not measure objectively.  No shoulder ROM allowed.   05/30/23:Left UE AAROM elevation in supine=   LEFT UE AROM elevation (scaption) in supine: 123    LEFT ER AROM supine:27   LEFT IR AROM in standing: fingertips to medial glut fold and thumb reaches glut crease   LEFT UE FLEX in standing pt can briefly hold at 64 degrees when therapist assists with concentric contraction  04/04/2023 Left UE AAROM elevation in supine=   LEFT UE AROM elevation in supine: 100  04/11/2023   LEFT ER AROM supine:17   LEFT IR AROM supine:68 30 deg abd      IN SUPINE 04/30/2023 Left shoulder flex AROM in supine;  117 with elbow flexed to initiate   IR AROM  to stomach    ER  AROM 30 degrees at 30 degrees  abduction     LYMPHEDEMA ASSESSMENTS:  LANDMARK RIGHT eval  At axilla 30.4  15cm proximal to olecranon 29.4  10 cm proximal to olecranon process 28.3  Olecranon process 24.7  15cm proximal to ulnar styloid 22.1  10 cm proximal to ulnar styloid process 18.7  Just proximal to ulnar styloid process 16.2  Across hand at thumb web space 19.8  At base of 2nd digit 6.4  (Blank rows = not tested)  LANDMARK LEFT  eval LEFT 03/26/2023 LEFT 04/11/2023 LEFT  04/30/2023 LEFT 05/09/23 Left 05/23/23  At axilla 33 31 29.6 29.7 29.7 30.3  15cm proximal to olecranon 32.5 29.4 28.8 28.4 27.8 27.8  10 cm proximal to olecranon process 34.5 30.2 28.0 27.6 27.4 27.4  Olecranon process 30.5 26.6 24.0 23.4 23.3 22.9  15cm proximal to ulnar styloid  28.7 25.6 23.0 23.0 23.3 22.9  10 cm proximal to ulnar styloid process 26.5 23.4 20.5 21.1 21.9 22  Just proximal to ulnar styloid process 18.5 17.2 15.9 15.8 15.9 15.7  Across hand at thumb web space 20.4 18.8 19.2 19.0 18.4 18.8  At base of 2nd digit 7.0 6.1 5.85 6.0 5.9 5.9  (Blank rows = not tested)  Volume (at eval):  Length 7cm Rt:                         Lt:                      04/30/2023  1596 ml difference   PATIENT SURVEYS:  Quick Dash 68% limited EVAL 04/29/2022     34%   TODAY'S TREATMENT:                                                                                                                                         DATE:  06/11/23: Therapeutic Exercises Finger Ladder into flex x 3 mins Pulleys 2 x 15 each into flex and scaption, horz abd 2 x 15 Finger Ladder Lt UE flex x 3 reps up to #29 Forearms on wall for alt scap retraction x 5 each side, this was very challenging for pt as she was still very tight in shoulder Standing Lt IR reaching behind to glut and pt can now reach superior Lt glut Trial of doorway pectoralis stretch for er and pect. Pt limited in motion but is able to do this Manual  Therapy MLD in supine: Short neck, 5 diaphragmatic breaths, Rt axillary nodes, anterior inter-axillary anastomosis, then Lt upper arm redirecting towards anastomosis P/ROM to Lt shoulder into flex, scaption and er/IR all to tolerance with scapular depression by therapist STM with cocoa butter to Lt shoulder, upper arm and UT's   06/07/23: Therapeutic Exercises Pt demonstrated Lt bicep curls with yellow theraband, educated her it's also important to strengthen the tricep  Tricep extension with yell band over top of door x 10 reps Modified prone (bed elevated so pt could lay upper body on bed as unable to lay prone) for Lt UE ext with scap retract at end motion x 10, then Lt UE abd as able and tactile cuing for correct scapular engagement without UT x 10 UE Ranger for LT UE flex focusing on decreasing scapular compensation x 3 mins Finger Ladder into Lt YUE flex x 4 up to #29 Lt tricep ext again with yellow band over door x 20 with tactile cuing for correct UE position and Vcs for tricep squeeze at end motion, then Lt bil UE ext in larger ROM x 20 Manual Therapy P/ROM of Lt shoulder into flex, abd and er/IR all to tolerance with scapular depression by therapist STM with cocoa butter to Lt upper arm, lats, and deltoid muscles  06/04/2023 Pulleys x 2 min flexion and scaption to improve ROM Shoulder ranger x 4 min and scaption x 2 min with TC to depress scapula Finger ladder 5 times Left ebow flexion yellow x 10 Reach to eye level shelf;TC's by PT to encourage scapular depression, and pt assisting with 2 fingers in palm x 5-6 reps MANUAL P/ROM of Lt shoulder into flex,  abd and er/IR all to tolerance with scapular depression by therapist STM with cocoa butter to Lt upper arm, lats, and deltoid muscles Assisted pt with velcro wrap and cover  06/01/23: Therapeutic Exercises Pulleys for review to assess technique x 1 min into flex and scaption with cues to relax at top of motions as pt is  compensating at that point UE Ranger into flex x 4 mins and VC's to decrease scapular compensation top of motion Finger Ladder into flex x 3 up to #29, then Lt scaption x 1 up to #29 Manual Therapy  P/ROM of Lt shoulder into flex, abd and er/IR all to tolerance with scapular depression by therapist STM with cocoa butter to Lt upper arm, lats,UT and deltoid muscles      PATIENT EDUCATION:  Access Code: [email or web address] URL: [email or web address] Date: 04/13/2023 Prepared by: Alvira Monday  Exercises - Standing Isometric Shoulder Internal Rotation at Doorway  - 1 x daily - 7 x weekly - 3 sets - 10 reps - Standing Isometric Shoulder External Rotation with Doorway  - 1 x daily - 7 x weekly - 1 sets - 5-10 reps - Isometric Shoulder Flexion at Wall  - 1 x daily - 7 x weekly - 1 sets - 5-10 reps - 5 hold - Isometric Shoulder Extension at Wall  - 1 x daily - 7 x weekly - 1 sets - 5-10 reps - Isometric Shoulder Abduction at Wall  - 1 x daily - 7 x weekly - 1 sets - 5-10 reps   05/02/23: Education details: Tricep ext with band over top of door Person educated: Patient Education method: Explanation, Demonstration, and Verbal cues, and tactile cues Education comprehension: verbalized understanding, returned demonstration, tactile and VC's   HOME EXERCISE PROGRAM: Scapular retraction, shoulder extension yellow band x 10 Supine alphabet Isometrics for Lt shoulder Standing dowel exercises Self MLD Forearms on wall for scapular strength with yellow theraband Lt tricep ext with yellow theraband over door  ASSESSMENT:  CLINICAL IMPRESSION: Pt reports she didn't get to stretch very much this weekend at home as she was dealing with a GI issue. So today continued with AA/ROM of Lt shoulder and manual techniques to improve lymphatic flow and decrease Lt upper quadrant tightness.   OBJECTIVE IMPAIRMENTS: decreased activity tolerance, decreased knowledge of condition, decreased knowledge of  use of DME, decreased mobility, decreased ROM, and increased edema.   ACTIVITY LIMITATIONS: carrying, lifting, sleeping, bathing, dressing, and reach over head  PARTICIPATION LIMITATIONS: meal prep, cleaning, laundry, shopping, and community activity  PERSONAL FACTORS: Age and 1 comorbidity: ALND and radiation hx  are also affecting patient's functional outcome.   REHAB POTENTIAL: Excellent  CLINICAL DECISION MAKING: Evolving/moderate complexity  EVALUATION COMPLEXITY: Moderate  GOALS: Goals reviewed with patient? Yes  SHORT TERM GOALS: Target date: 04/08/23    Pt will be ind with self MLD for edema reduction Baseline: Goal status: In progress 2.  Pt will decrease volume to or less to demonstrate improving edema status Baseline:  Goal status: MET 1596  (8 ml more) 04/30/2023 3.  Pt will be evaluated and treated for shoulder with goals added as needed as instructed by MD during future visits.  Baseline:  Goal status:MET 04/04/2023  LONG TERM GOALS: Target date: 05/10/23  Pt will reduce to less than volume difference between sides to demonstrate no active lymphedema Baseline:  Goal status: MET 04/29/2022 (1596 ml) 2.  Pt will obtain compression as needed for discharge depending on reduction Baseline:  Goal  status: MET 04/11/2023   3. Pt will be able to perform AROM  elevation in supine to atleast 120 degrees without compensation  Goal Status; MET: 117 04/30/2023; 123 degrees - 05/30/23  4. Pt will be able to place 8 oz object on eye level shelf without UT compensation  GOALStatus: In progress; 05/30/23 - pt able to lift 1# half way to eye level shelf before compensation  5. Pt will be tolerant of resisted exs for improving UE strength at 7 weeks post op Goal Status; PARTIALLY MET - 05/30/23 - pt is independent with current HEP of A/AA/ROM and isometric strength, has started progressing to theraband  6. Pt will be able to return to driving for greater independence Goal  status: PROGRESSING - 05/30/23 - pt is able to drive close to home (grocery store and pharmacy) but has not been able to return to highway driving that will require quick turns and maneuvering she is not able to do yet  7. Pt will be able to achieve 80-90 degrees Lt shoulder flexion for increased ease with ADLs and washing hair.  Baseline: 05/30/23 - 64 degrees   Goal Status: NEW  PLAN:  PT FREQUENCY: 2-3x/week  PT DURATION:  4 weeks  PLANNED INTERVENTIONS: 97164- PT Re-evaluation, 97110-Therapeutic exercises, 97530- Therapeutic activity, 97112- Neuromuscular re-education, 97535- Self Care, and 78469- Manual therapy  PLAN FOR NEXT SESSION:  Review band exs and new forearms on wall;  progress Lt UE and scapula strength, review and cont MLD prn; Pt needs new script for flat knit but should wait to be measured until fibrosis further decreases.   Berna Spare, PTA 06/11/23 1:18 PM    05/14/23: Start with forearms on wall holding short yellow theraband in hands:  Pull down keeping elbow bent and squeezing shoulder blade towards spine at end of pull  2 sets of 5 each  2.   Inch forearms up wall keeping elbows in only as high as you can control by keeping the shoulder blade down  2 sets of 5 each  Next:  Throw long band over top of door, close door and pull down with Lt arm, when returning to start position keep shoulder down  Cancer Rehab [phone]

## 2023-06-14 ENCOUNTER — Ambulatory Visit: Payer: Medicare PPO

## 2023-06-25 ENCOUNTER — Ambulatory Visit: Payer: Medicare PPO | Attending: Orthopedic Surgery

## 2023-06-25 DIAGNOSIS — Z4789 Encounter for other orthopedic aftercare: Secondary | ICD-10-CM | POA: Diagnosis not present

## 2023-06-25 DIAGNOSIS — Z96612 Presence of left artificial shoulder joint: Secondary | ICD-10-CM | POA: Insufficient documentation

## 2023-06-25 DIAGNOSIS — I972 Postmastectomy lymphedema syndrome: Secondary | ICD-10-CM | POA: Insufficient documentation

## 2023-06-25 DIAGNOSIS — M25612 Stiffness of left shoulder, not elsewhere classified: Secondary | ICD-10-CM | POA: Insufficient documentation

## 2023-06-25 NOTE — Therapy (Signed)
 OUTPATIENT PT UPPER EXTREMITY LYMPHEDEMA TREATMENT  Patient Name: Sarah Underwood MRN: [id number] DOB:[birth year], 78 y.o., female Today's Date: 06/25/2023  END OF SESSION:  PT End of Session - 06/25/23 1504     Visit Number 30    Number of Visits 37    Date for PT Re-Evaluation 07/11/23    Authorization Type [employer] Auth needed    Authorization Time Period new auth sent on 06/02/23; 12 visits approved 06/04/2023-07/12/2023    Authorization - Visit Number 4    Authorization - Number of Visits 12    Progress Note Due on Visit 39    PT Start Time 1459    PT Stop Time 1557    PT Time Calculation (min) 58 min    Activity Tolerance Patient tolerated treatment well    Behavior During Therapy WFL for tasks assessed/performed             Past Medical History:  Diagnosis Date   Basal cell carcinoma 1994   right side of node   Benign positional vertigo    with rolling over    Celiac disease    Dysrhythmia    Generalized anxiety disorder    GERD (gastroesophageal reflux disease)    Graves disease 2007   thyroid nodule   Hard of hearing    Heart murmur    History of breast cancer 1995   left - treated with mastectomy   History of radiation therapy 2001   37 sessions following recurrence of breast cancer in left chest wall   Hypertension    Knee pain, bilateral 12/14/2014   Major depressive disorder in remission    Mild persistent asthma, uncomplicated 06/29/2015   Nocturia    OA (osteoarthritis) of hip 09/08/2015   Ovarian mass    small solid nodule 8x70mm: Right ovary - stable since 2000, follow u/s do not show this.   Renal mass 2021   followed by Dr. Berneice Heinrich, [facility].  Having every 6 month follow up   Seasonal allergies    Stress fracture of metatarsal bone of left foot 12/15/2019   Vegetarian diet    Past Surgical History:  Procedure Laterality Date   APPENDECTOMY     basal cell removal  7/94   right nose   BREAST BIOPSY Right 1987   benign fibroadenoma   breast  cancer recurrence Left 01/2000   at site of left chest wall after previous mastectomy with removal and treatment with radiaton.   CARPAL TUNNEL RELEASE Right 05/08/2013   Procedure: RIGHT CARPAL TUNNEL RELEASE;  Surgeon: Wyn Forster., MD;  Location: [facility];  Service: Orthopedics;  Laterality: Right;   CATARACT EXTRACTION     03/20/14 left eye, 12/15 right eye   COLPOSCOPY W/ BIOPSY / CURETTAGE  03/12/12   benign, but + HR HPV   EYE SURGERY     bilat cataract surgery    MASTECTOMY Left 3/95   Stage IIb 0/14 LN ER/ PR + with mastectomy - Dr. Mardella Layman   MOHS SURGERY     right nare   OVARY SURGERY  1974   lt   REVERSE SHOULDER ARTHROPLASTY Left 02/26/2023   Procedure: REVERSE SHOULDER ARTHROPLASTY;  Surgeon: Yolonda Kida, MD;  Location: [facility] OR;  Service: Orthopedics;  Laterality: Left;   TONSILLECTOMY AND ADENOIDECTOMY  1950   TOTAL HIP ARTHROPLASTY Right 09/08/2015   Procedure: TOTAL HIP ARTHROPLASTY ANTERIOR APPROACH;  Surgeon: Ollen Gross, MD;  Location: [facility];  Service: Orthopedics;  Laterality: Right;   Patient Active Problem List   Diagnosis Date Noted   Shoulder fracture 02/23/2023   Closed fracture of proximal end of left humerus, unspecified fracture morphology, initial encounter 02/22/2023   Thyroid nodule 02/22/2023   Major depressive disorder in remission 03/29/2020   Generalized anxiety disorder    Benign positional vertigo    Hypertension    Heart murmur    Stress fracture of metatarsal bone of left foot 12/15/2019   History of revision of total replacement of right hip joint 11/26/2017   Leg edema, left 09/26/2016   Post-nasal drainage 03/30/2016   OA (osteoarthritis) of hip 09/08/2015   Mild persistent asthma, uncomplicated 06/29/2015   Hip pain, bilateral 12/14/2014   Knee pain, bilateral 12/14/2014   Postmenopausal atrophic vaginitis 02/23/2013   Graves disease 2007   History of radiation therapy 2001   History of breast cancer  1995   Basal cell carcinoma 1994    PCP: Alysia Penna, MD  REFERRING PROVIDER: Duwayne Heck, MD  REFERRING DIAG:  Diagnosis  C50.511,Z17.0 (ICD-10-CM) - Malignant neoplasm of lower-outer quadrant of right breast of female, estrogen receptor positive (HCC)  I89.0 (ICD-10-CM) - Lymphedema  Z47.89 orthopedic aftercare  S/P reverse total shoulder arthroplasty, left  Encounter for other orthopedic aftercare  Stiffness of left shoulder, not elsewhere classified  THERAPY DIAG:  Postmastectomy lymphedema  S/P reverse total shoulder arthroplasty, left  Encounter for other orthopedic aftercare  Stiffness of left shoulder, not elsewhere classified  ONSET DATE: 02/26/23  Rationale for Evaluation and Treatment: Rehabilitation  SUBJECTIVE                                                                                                                                                                                           SUBJECTIVE STATEMENT:  I went back to the gym last week and that has gone pretty well. I can tell how weak I am but I've just been incorporating things you guys have taught me. I've also been sprinkling stretches into my day at home a lot. And when I can't do it I use my Rt arm to lift my Lt arm and then I control the eccentric contraction.   PERTINENT HISTORY: Recent fall with left arm fracture.  Surgery with Reverse TSR 02/26/23.  This is the same side as her mastectomy which was diagnosed in 1995 with chest wall recurrence in 2001.  No remaining lymph nodes.   Shoulder surgery: Dr. Duwayne Heck -See media section for post-op approved exercises and okay for compression therapy.    PAIN:  PAIN:  Are you having pain? No, I've adjusted to the cane  PRECAUTIONS: cleared for all ROM/activity of the shoulder per MD  RED FLAGS: None   WEIGHT BEARING RESTRICTIONS: No  FALLS:  Has patient fallen in last 6 months? Yes. Number of falls 1 - does not remember details  of her fall. Just remembers she was going outside.   LIVING ENVIRONMENT: Lives with: lives alone Has a CNA for assistance at home.   OCCUPATION: Retired  LEISURE: Not currently exercising  HAND DOMINANCE : Right  PRIOR LEVEL OF FUNCTION: Independent  PATIENT GOALS: prevent chronic lymphedema after this fall   OBJECTIVE (FROM EVAL) Note: Objective measures were completed at Evaluation unless otherwise noted.  COGNITION:  Overall cognitive status: Within functional limits for tasks assessed   PALPATION: +2 pitting edema back of hand, forearm, below, to around the distal 1/3rd of the upper arm.   OBSERVATIONS / OTHER ASSESSMENTS: bruising all fingers and hand, elbow, and upper arm from surgery.  Skin tight and shiny from edema.     POSTURE: wearing sling upon arrival and leaving  UPPER EXTREMITY AROM/PROM: Just starting wrist and elbow AROM - did not measure objectively.  No shoulder ROM allowed.   05/30/23:Left UE AAROM elevation in supine=   LEFT UE AROM elevation (scaption) in supine: 123    LEFT ER AROM supine:27   LEFT IR AROM in standing: fingertips to medial glut fold and thumb reaches glut crease   LEFT UE FLEX in standing pt can briefly hold at 64 degrees when therapist assists with concentric contraction  04/04/2023 Left UE AAROM elevation in supine=   LEFT UE AROM elevation in supine: 100  04/11/2023   LEFT ER AROM supine:17   LEFT IR AROM supine:68 30 deg abd      IN SUPINE 04/30/2023 Left shoulder flex AROM in supine;  117 with elbow flexed to initiate   IR AROM  to stomach    ER  AROM 30 degrees at 30 degrees abduction     LYMPHEDEMA ASSESSMENTS:  LANDMARK RIGHT eval  At axilla 30.4  15cm proximal to olecranon 29.4  10 cm proximal to olecranon process 28.3  Olecranon process 24.7  15cm proximal to ulnar styloid 22.1  10 cm proximal to ulnar styloid process 18.7  Just proximal to ulnar styloid process 16.2  Across hand at thumb web space 19.8  At  base of 2nd digit 6.4  (Blank rows = not tested)  LANDMARK LEFT  eval LEFT 03/26/2023 LEFT 04/11/2023 LEFT  04/30/2023 LEFT 05/09/23 Left 05/23/23  At axilla 33 31 29.6 29.7 29.7 30.3  15cm proximal to olecranon 32.5 29.4 28.8 28.4 27.8 27.8  10 cm proximal to olecranon process 34.5 30.2 28.0 27.6 27.4 27.4  Olecranon process 30.5 26.6 24.0 23.4 23.3 22.9  15cm proximal to ulnar styloid  28.7 25.6 23.0 23.0 23.3 22.9  10 cm proximal to ulnar styloid process 26.5 23.4 20.5 21.1 21.9 22  Just proximal to ulnar styloid process 18.5 17.2 15.9 15.8 15.9 15.7  Across hand at thumb web space 20.4 18.8 19.2 19.0 18.4 18.8  At base of 2nd digit 7.0 6.1 5.85 6.0 5.9 5.9  (Blank rows = not tested)  Volume (at eval):          Length 7cm Rt:                         Lt:   04/30/2023  1596 ml difference   PATIENT SURVEYS:  Quick Dash 68% limited EVAL 04/29/2022     34%   TODAY'S TREATMENT:                                                                                                                                         DATE:  06/25/23: Therapeutic Exercises Pulleys for warm up into flex and abd x 3 mins total with VC's to remind pt to decrease Lt scapular compensations throughout UE Ranger x 3 mins into flex, then scaption Finger Ladder to #32 (which is as high as pt could reach due to height so full A/ROM) x 5 Therapeutic Activity Small green weighted ball on wall for up/down x 30 sec and then side/side x 30 sec but pt required 3 brief rests during due to Lt UE fatigue Lt UE wall slide into flexion then into controlled eccentric abd x 4, very challenging for pt due to fatigue TE cont'd Wall stretches into flexion Lt UE and bil UE's Supine over half foam roll for following: Bil UE horz abd 2 x 8; then bil UE abd into a "snow angel" 2 x 7 with VC's to relax Manual Therapy P/ROM to Lt shoulder into flex, scaption and er/IR all to tolerance with scapular  depression by therapist STM over TSR incision and to Lt pect insertion  06/11/23: Therapeutic Exercises Finger Ladder into flex x 3 mins Pulleys 2 x 15 each into flex and scaption, horz abd 2 x 15 Finger Ladder Lt UE flex x 3 reps up to #29 Forearms on wall for alt scap retraction x 5 each side, this was very challenging for pt as she was still very tight in shoulder Standing Lt IR reaching behind to glut and pt can now reach superior Lt glut Trial of doorway pectoralis stretch for er and pect. Pt limited in motion but is able to do this Manual Therapy MLD in supine: Short neck, 5 diaphragmatic breaths, Rt axillary nodes, anterior inter-axillary anastomosis, then Lt upper arm redirecting towards anastomosis P/ROM to Lt shoulder into flex, scaption and er/IR all to tolerance with scapular depression by therapist STM with cocoa butter to Lt shoulder, upper arm and UT's   06/07/23: Therapeutic Exercises Pt demonstrated Lt bicep curls with yellow theraband, educated her it's also important to strengthen the tricep  Tricep extension with yell band over top of door x 10 reps Modified prone (bed elevated so pt could lay upper body on bed as unable to lay prone) for Lt UE ext with scap retract at end motion x 10, then Lt UE abd as able and tactile cuing for correct scapular engagement without UT x 10 UE Ranger for LT UE flex focusing on decreasing scapular compensation x 3 mins Finger Ladder into Lt YUE flex x 4 up to #29 Lt tricep ext again with yellow band over door  x 20 with tactile cuing for correct UE position and Vcs for tricep squeeze at end motion, then Lt bil UE ext in larger ROM x 20 Manual Therapy P/ROM of Lt shoulder into flex, abd and er/IR all to tolerance with scapular depression by therapist STM with cocoa butter to Lt upper arm, lats, and deltoid muscles  06/04/2023 Pulleys x 2 min flexion and scaption to improve ROM Shoulder ranger x 4 min and scaption x 2 min with TC to depress  scapula Finger ladder 5 times Left ebow flexion yellow x 10 Reach to eye level shelf;TC's by PT to encourage scapular depression, and pt assisting with 2 fingers in palm x 5-6 reps MANUAL P/ROM of Lt shoulder into flex, abd and er/IR all to tolerance with scapular depression by therapist STM with cocoa butter to Lt upper arm, lats, and deltoid muscles Assisted pt with velcro wrap and cover  06/01/23: Therapeutic Exercises Pulleys for review to assess technique x 1 min into flex and scaption with cues to relax at top of motions as pt is compensating at that point UE Ranger into flex x 4 mins and VC's to decrease scapular compensation top of motion Finger Ladder into flex x 3 up to #29, then Lt scaption x 1 up to #29 Manual Therapy  P/ROM of Lt shoulder into flex, abd and er/IR all to tolerance with scapular depression by therapist STM with cocoa butter to Lt upper arm, lats,UT and deltoid muscles      PATIENT EDUCATION:  Access Code: [email or web address] URL: [email or web address] Date: 04/13/2023 Prepared by: Alvira Monday  Exercises - Standing Isometric Shoulder Internal Rotation at Doorway  - 1 x daily - 7 x weekly - 3 sets - 10 reps - Standing Isometric Shoulder External Rotation with Doorway  - 1 x daily - 7 x weekly - 1 sets - 5-10 reps - Isometric Shoulder Flexion at Wall  - 1 x daily - 7 x weekly - 1 sets - 5-10 reps - 5 hold - Isometric Shoulder Extension at Wall  - 1 x daily - 7 x weekly - 1 sets - 5-10 reps - Isometric Shoulder Abduction at Wall  - 1 x daily - 7 x weekly - 1 sets - 5-10 reps   05/02/23: Education details: Tricep ext with band over top of door Person educated: Patient Education method: Explanation, Demonstration, and Verbal cues, and tactile cues Education comprehension: verbalized understanding, returned demonstration, tactile and VC's   HOME EXERCISE PROGRAM: Scapular retraction, shoulder extension yellow band x 10 Supine alphabet Isometrics for  Lt shoulder Standing dowel exercises Self MLD Forearms on wall for scapular strength with yellow theraband Lt tricep ext with yellow theraband over door  ASSESSMENT:  CLINICAL IMPRESSION: Pt missed a week of appts due to once insurance approval returned there were no available appts for last week. Today pt demonstrates great progress with AA/ROM reaching up wall and with UE Ranger (to wall today) and with finer ladder. Progressed pt to include scapular stability with weighted ball roll on wall and with controlling eccentrica contraction with wall slides. Then continued with manual therapy working to improve her end Lt shoulder P/ROM. Began discussing with pt we may be able to decr freq to 1x/wk soon as she is progressing so well.   OBJECTIVE IMPAIRMENTS: decreased activity tolerance, decreased knowledge of condition, decreased knowledge of use of DME, decreased mobility, decreased ROM, and increased edema.   ACTIVITY LIMITATIONS: carrying, lifting, sleeping, bathing, dressing, and reach over head  PARTICIPATION  LIMITATIONS: meal prep, cleaning, laundry, shopping, and community activity  PERSONAL FACTORS: Age and 1 comorbidity: ALND and radiation hx  are also affecting patient's functional outcome.   REHAB POTENTIAL: Excellent  CLINICAL DECISION MAKING: Evolving/moderate complexity  EVALUATION COMPLEXITY: Moderate  GOALS: Goals reviewed with patient? Yes  SHORT TERM GOALS: Target date: 04/08/23    Pt will be ind with self MLD for edema reduction Baseline: Goal status: In progress 2.  Pt will decrease volume to or less to demonstrate improving edema status Baseline:  Goal status: MET 1596  (8 ml more) 04/30/2023 3.  Pt will be evaluated and treated for shoulder with goals added as needed as instructed by MD during future visits.  Baseline:  Goal status:MET 04/04/2023  LONG TERM GOALS: Target date: 05/10/23  Pt will reduce to less than volume difference between sides  to demonstrate no active lymphedema Baseline:  Goal status: MET 04/29/2022 (1596 ml) 2.  Pt will obtain compression as needed for discharge depending on reduction Baseline:  Goal status: MET 04/11/2023   3. Pt will be able to perform AROM  elevation in supine to atleast 120 degrees without compensation  Goal Status; MET: 117 04/30/2023; 123 degrees - 05/30/23  4. Pt will be able to place 8 oz object on eye level shelf without UT compensation  GOALStatus: In progress; 05/30/23 - pt able to lift 1# half way to eye level shelf before compensation  5. Pt will be tolerant of resisted exs for improving UE strength at 7 weeks post op Goal Status; PARTIALLY MET - 05/30/23 - pt is independent with current HEP of A/AA/ROM and isometric strength, has started progressing to theraband  6. Pt will be able to return to driving for greater independence Goal status: PROGRESSING - 05/30/23 - pt is able to drive close to home (grocery store and pharmacy) but has not been able to return to highway driving that will require quick turns and maneuvering she is not able to do yet  7. Pt will be able to achieve 80-90 degrees Lt shoulder flexion for increased ease with ADLs and washing hair.  Baseline: 05/30/23 - 64 degrees   Goal Status: NEW  PLAN:  PT FREQUENCY: 2-3x/week  PT DURATION:  4 weeks  PLANNED INTERVENTIONS: 97164- PT Re-evaluation, 97110-Therapeutic exercises, 97530- Therapeutic activity, 97112- Neuromuscular re-education, 97535- Self Care, and 21308- Manual therapy  PLAN FOR NEXT SESSION:  Review weighted ball on wall and forearms on wall;  progress Lt UE and scapula strength, review and cont MLD prn; Pt needs new script for flat knit but should wait to be measured until fibrosis further decreases.  06/25/23: Faxed script for garments to Dr. Hiram Comber, PTA 06/25/23 4:02 PM    05/14/23: Start with forearms on wall holding short yellow theraband in hands:  Pull down keeping elbow bent  and squeezing shoulder blade towards spine at end of pull  2 sets of 5 each  2.   Inch forearms up wall keeping elbows in only as high as you can control by keeping the shoulder blade down  2 sets of 5 each  Next:  Throw long band over top of door, close door and pull down with Lt arm, when returning to start position keep shoulder down  Cancer Rehab [phone]

## 2023-06-27 DIAGNOSIS — M25551 Pain in right hip: Secondary | ICD-10-CM | POA: Diagnosis not present

## 2023-06-28 ENCOUNTER — Ambulatory Visit: Payer: Medicare PPO

## 2023-06-28 DIAGNOSIS — Z4789 Encounter for other orthopedic aftercare: Secondary | ICD-10-CM | POA: Diagnosis not present

## 2023-06-28 DIAGNOSIS — M25612 Stiffness of left shoulder, not elsewhere classified: Secondary | ICD-10-CM

## 2023-06-28 DIAGNOSIS — I972 Postmastectomy lymphedema syndrome: Secondary | ICD-10-CM

## 2023-06-28 DIAGNOSIS — Z96612 Presence of left artificial shoulder joint: Secondary | ICD-10-CM

## 2023-06-28 NOTE — Therapy (Signed)
 OUTPATIENT PT UPPER EXTREMITY LYMPHEDEMA TREATMENT  Patient Name: Sarah Underwood MRN: [id number] DOB:May 27, 1945, 78 y.o., female Today's Date: 06/28/2023  END OF SESSION:  PT End of Session - 06/28/23 1005     Visit Number 31    Number of Visits 37    Date for PT Re-Evaluation 07/11/23    Authorization Type [employer] Auth needed    Authorization Time Period new auth sent on 06/02/23; 12 visits approved 06/04/2023-07/12/2023    Authorization - Visit Number 5    Authorization - Number of Visits 12    PT Start Time 1005    PT Stop Time 1056    PT Time Calculation (min) 51 min    Activity Tolerance Patient tolerated treatment well    Behavior During Therapy WFL for tasks assessed/performed             Past Medical History:  Diagnosis Date   Basal cell carcinoma 1994   right side of node   Benign positional vertigo    with rolling over    Celiac disease    Dysrhythmia    Generalized anxiety disorder    GERD (gastroesophageal reflux disease)    Graves disease 2007   thyroid nodule   Hard of hearing    Heart murmur    History of breast cancer 1995   left - treated with mastectomy   History of radiation therapy 2001   37 sessions following recurrence of breast cancer in left chest wall   Hypertension    Knee pain, bilateral 12/14/2014   Major depressive disorder in remission    Mild persistent asthma, uncomplicated 06/29/2015   Nocturia    OA (osteoarthritis) of hip 09/08/2015   Ovarian mass    small solid nodule 8x6mm: Right ovary - stable since 2000, follow u/s do not show this.   Renal mass 2021   followed by Dr. Berneice Heinrich, [facility].  Having every 6 month follow up   Seasonal allergies    Stress fracture of metatarsal bone of left foot 12/15/2019   Vegetarian diet    Past Surgical History:  Procedure Laterality Date   APPENDECTOMY     basal cell removal  7/94   right nose   BREAST BIOPSY Right 1987   benign fibroadenoma   breast cancer recurrence Left 01/2000   at  site of left chest wall after previous mastectomy with removal and treatment with radiaton.   CARPAL TUNNEL RELEASE Right 05/08/2013   Procedure: RIGHT CARPAL TUNNEL RELEASE;  Surgeon: Wyn Forster., MD;  Location: [facility];  Service: Orthopedics;  Laterality: Right;   CATARACT EXTRACTION     03/20/14 left eye, 12/15 right eye   COLPOSCOPY W/ BIOPSY / CURETTAGE  03/12/12   benign, but + HR HPV   EYE SURGERY     bilat cataract surgery    MASTECTOMY Left 3/95   Stage IIb 0/14 LN ER/ PR + with mastectomy - Dr. Mardella Layman   MOHS SURGERY     right nare   OVARY SURGERY  1974   lt   REVERSE SHOULDER ARTHROPLASTY Left 02/26/2023   Procedure: REVERSE SHOULDER ARTHROPLASTY;  Surgeon: Yolonda Kida, MD;  Location: [facility] OR;  Service: Orthopedics;  Laterality: Left;   TONSILLECTOMY AND ADENOIDECTOMY  1950   TOTAL HIP ARTHROPLASTY Right 09/08/2015   Procedure: TOTAL HIP ARTHROPLASTY ANTERIOR APPROACH;  Surgeon: Ollen Gross, MD;  Location: [facility];  Service: Orthopedics;  Laterality: Right;   Patient Active Problem List  Diagnosis Date Noted   Shoulder fracture 02/23/2023   Closed fracture of proximal end of left humerus, unspecified fracture morphology, initial encounter 02/22/2023   Thyroid nodule 02/22/2023   Major depressive disorder in remission 03/29/2020   Generalized anxiety disorder    Benign positional vertigo    Hypertension    Heart murmur    Stress fracture of metatarsal bone of left foot 12/15/2019   History of revision of total replacement of right hip joint 11/26/2017   Leg edema, left 09/26/2016   Post-nasal drainage 03/30/2016   OA (osteoarthritis) of hip 09/08/2015   Mild persistent asthma, uncomplicated 06/29/2015   Hip pain, bilateral 12/14/2014   Knee pain, bilateral 12/14/2014   Postmenopausal atrophic vaginitis 02/23/2013   Graves disease 2007   History of radiation therapy 2001   History of breast cancer 1995   Basal cell carcinoma 1994     PCP: Alysia Penna, MD  REFERRING PROVIDER: Duwayne Heck, MD  REFERRING DIAG:  Diagnosis  C50.511,Z17.0 (ICD-10-CM) - Malignant neoplasm of lower-outer quadrant of right breast of female, estrogen receptor positive (HCC)  I89.0 (ICD-10-CM) - Lymphedema  Z47.89 orthopedic aftercare  S/P reverse total shoulder arthroplasty, left  Encounter for other orthopedic aftercare  Stiffness of left shoulder, not elsewhere classified  THERAPY DIAG:  Postmastectomy lymphedema  S/P reverse total shoulder arthroplasty, left  Encounter for other orthopedic aftercare  Stiffness of left shoulder, not elsewhere classified  ONSET DATE: 02/26/23  Rationale for Evaluation and Treatment: Rehabilitation  SUBJECTIVE                                                                                                                                                                                           SUBJECTIVE STATEMENT:  Shoulder is doing well. I still can't reach up very well but I can wash my hair now without always bending forward. I can also pull my shirt off over my head if its a loose shirt    PERTINENT HISTORY: Recent fall with left arm fracture.  Surgery with Reverse TSR 02/26/23.  This is the same side as her mastectomy which was diagnosed in 1995 with chest wall recurrence in 2001.  No remaining lymph nodes.   Shoulder surgery: Dr. Duwayne Heck -See media section for post-op approved exercises and okay for compression therapy.    PAIN:  PAIN:  Are you having pain? No, I've adjusted to the cane   PRECAUTIONS: cleared for all ROM/activity of the shoulder per MD  RED FLAGS: None   WEIGHT BEARING RESTRICTIONS: No  FALLS:  Has patient fallen in last 6 months? Yes. Number of falls 1 - does  not remember details of her fall. Just remembers she was going outside.   LIVING ENVIRONMENT: Lives with: lives alone Has a CNA for assistance at home.   OCCUPATION: Retired  LEISURE: Not  currently exercising  HAND DOMINANCE : Right  PRIOR LEVEL OF FUNCTION: Independent  PATIENT GOALS: prevent chronic lymphedema after this fall   OBJECTIVE (FROM EVAL) Note: Objective measures were completed at Evaluation unless otherwise noted.  COGNITION:  Overall cognitive status: Within functional limits for tasks assessed   PALPATION: +2 pitting edema back of hand, forearm, below, to around the distal 1/3rd of the upper arm.   OBSERVATIONS / OTHER ASSESSMENTS: bruising all fingers and hand, elbow, and upper arm from surgery.  Skin tight and shiny from edema.     POSTURE: wearing sling upon arrival and leaving  UPPER EXTREMITY AROM/PROM: Just starting wrist and elbow AROM - did not measure objectively.  No shoulder ROM allowed.   05/30/23:Left UE AAROM elevation in supine=   LEFT UE AROM elevation (scaption) in supine: 123    LEFT ER AROM supine:27   LEFT IR AROM in standing: fingertips to medial glut fold and thumb reaches glut crease   LEFT UE FLEX in standing pt can briefly hold at 64 degrees when therapist assists with concentric contraction  04/04/2023 Left UE AAROM elevation in supine=   LEFT UE AROM elevation in supine: 100  04/11/2023   LEFT ER AROM supine:17   LEFT IR AROM supine:68 30 deg abd      IN SUPINE 04/30/2023 Left shoulder flex AROM in supine;  117 with elbow flexed to initiate   IR AROM  to stomach    ER  AROM 30 degrees at 30 degrees abduction     LYMPHEDEMA ASSESSMENTS:  LANDMARK RIGHT eval  At axilla 30.4  15cm proximal to olecranon 29.4  10 cm proximal to olecranon process 28.3  Olecranon process 24.7  15cm proximal to ulnar styloid 22.1  10 cm proximal to ulnar styloid process 18.7  Just proximal to ulnar styloid process 16.2  Across hand at thumb web space 19.8  At base of 2nd digit 6.4  (Blank rows = not tested)  LANDMARK LEFT  eval LEFT 03/26/2023 LEFT 04/11/2023 LEFT  04/30/2023 LEFT 05/09/23 Left 05/23/23  At axilla 33 31 29.6  29.7 29.7 30.3  15cm proximal to olecranon 32.5 29.4 28.8 28.4 27.8 27.8  10 cm proximal to olecranon process 34.5 30.2 28.0 27.6 27.4 27.4  Olecranon process 30.5 26.6 24.0 23.4 23.3 22.9  15cm proximal to ulnar styloid  28.7 25.6 23.0 23.0 23.3 22.9  10 cm proximal to ulnar styloid process 26.5 23.4 20.5 21.1 21.9 22  Just proximal to ulnar styloid process 18.5 17.2 15.9 15.8 15.9 15.7  Across hand at thumb web space 20.4 18.8 19.2 19.0 18.4 18.8  At base of 2nd digit 7.0 6.1 5.85 6.0 5.9 5.9  (Blank rows = not tested)  Volume (at eval):          Length 7cm Rt:                         Lt:                      04/30/2023  1596 ml difference   PATIENT SURVEYS:  Quick Dash 68% limited EVAL 04/29/2022     34%   TODAY'S TREATMENT:  DATE:  06/28/2023 Therapeutic Exercises UE Ranger x 3 mins into flex, then scaption Finger Ladder to #32 (which is as high as pt could reach due to height so full A/ROM) x 5 Half foam roll flexion x 8, horizontal abd x 8 stopping before table so she could raise up her in  horizontal abd, snow angles x 8 Neuromuscular Small green weighted ball on wall for up/down x 30 sec and then side/side x 30 sec but pt required 3 brief rests during due to Lt UE fatigue Lt UE wall slide into flexion then into controlled eccentric abd x 5, very challenging for pt due to fatigue Manual Therapy P/ROM to Lt shoulder into flex, scaption and er/IR all to tolerance with scapular depression by therapist STM over TSR incision and to Lt pect insertion  06/25/23: Therapeutic Exercises Pulleys for warm up into flex and abd x 3 mins total with VC's to remind pt to decrease Lt scapular compensations throughout UE Ranger x 3 mins into flex, then scaption Finger Ladder to #32 (which is as high as pt could reach due to height so full  A/ROM) x 5 Therapeutic Activity Small green weighted ball on wall for up/down x 30 sec and then side/side x 30 sec but pt required 3 brief rests during due to Lt UE fatigue Lt UE wall slide into flexion then into controlled eccentric abd x 4, very challenging for pt due to fatigue TE cont'd Wall stretches into flexion Lt UE and bil UE's Supine over half foam roll for following: Bil UE horz abd 2 x 8; then bil UE abd into a "snow angel" 2 x 7 with VC's to relax Manual Therapy P/ROM to Lt shoulder into flex, scaption and er/IR all to tolerance with scapular depression by therapist STM over TSR incision and to Lt pect insertion, and mid delt with cocoa butter  06/11/23: Therapeutic Exercises Finger Ladder into flex x 3 mins Pulleys 2 x 15 each into flex and scaption, horz abd 2 x 15 Finger Ladder Lt UE flex x 3 reps up to #29 Forearms on wall for alt scap retraction x 5 each side, this was very challenging for pt as she was still very tight in shoulder Standing Lt IR reaching behind to glut and pt can now reach superior Lt glut Trial of doorway pectoralis stretch for er and pect. Pt limited in motion but is able to do this Manual Therapy MLD in supine: Short neck, 5 diaphragmatic breaths, Rt axillary nodes, anterior inter-axillary anastomosis, then Lt upper arm redirecting towards anastomosis P/ROM to Lt shoulder into flex, scaption and er/IR all to tolerance with scapular depression by therapist STM with cocoa butter to Lt shoulder, upper arm and UT's   06/07/23: Therapeutic Exercises Pt demonstrated Lt bicep curls with yellow theraband, educated her it's also important to strengthen the tricep  Tricep extension with yell band over top of door x 10 reps Modified prone (bed elevated so pt could lay upper body on bed as unable to lay prone) for Lt UE ext with scap retract at end motion x 10, then Lt UE abd as able and tactile cuing for correct scapular engagement without UT x 10 UE Ranger for  LT UE flex focusing on decreasing scapular compensation x 3 mins Finger Ladder into Lt YUE flex x 4 up to #29 Lt tricep ext again with yellow band over door x 20 with tactile cuing for correct UE position and Vcs for tricep squeeze at end motion,  then Lt bil UE ext in larger ROM x 20 Manual Therapy P/ROM of Lt shoulder into flex, abd and er/IR all to tolerance with scapular depression by therapist STM with cocoa butter to Lt upper arm, lats, and deltoid muscles  06/04/2023 Pulleys x 2 min flexion and scaption to improve ROM Shoulder ranger x 4 min and scaption x 2 min with TC to depress scapula Finger ladder 5 times Left ebow flexion yellow x 10 Reach to eye level shelf;TC's by PT to encourage scapular depression, and pt assisting with 2 fingers in palm x 5-6 reps MANUAL P/ROM of Lt shoulder into flex, abd and er/IR all to tolerance with scapular depression by therapist STM with cocoa butter to Lt upper arm, lats, and deltoid muscles Assisted pt with velcro wrap and cover  06/01/23: Therapeutic Exercises Pulleys for review to assess technique x 1 min into flex and scaption with cues to relax at top of motions as pt is compensating at that point UE Ranger into flex x 4 mins and VC's to decrease scapular compensation top of motion Finger Ladder into flex x 3 up to #29, then Lt scaption x 1 up to #29 Manual Therapy  P/ROM of Lt shoulder into flex, abd and er/IR all to tolerance with scapular depression by therapist STM with cocoa butter to Lt upper arm, lats,UT and deltoid muscles      PATIENT EDUCATION:  Access Code: [email or web address] URL: [email or web address] Date: 04/13/2023 Prepared by: Alvira Monday  Exercises - Standing Isometric Shoulder Internal Rotation at Doorway  - 1 x daily - 7 x weekly - 3 sets - 10 reps - Standing Isometric Shoulder External Rotation with Doorway  - 1 x daily - 7 x weekly - 1 sets - 5-10 reps - Isometric Shoulder Flexion at Wall  - 1 x daily - 7  x weekly - 1 sets - 5-10 reps - 5 hold - Isometric Shoulder Extension at Wall  - 1 x daily - 7 x weekly - 1 sets - 5-10 reps - Isometric Shoulder Abduction at Wall  - 1 x daily - 7 x weekly - 1 sets - 5-10 reps   05/02/23: Education details: Tricep ext with band over top of door Person educated: Patient Education method: Explanation, Demonstration, and Verbal cues, and tactile cues Education comprehension: verbalized understanding, returned demonstration, tactile and VC's   HOME EXERCISE PROGRAM: Scapular retraction, shoulder extension yellow band x 10 Supine alphabet Isometrics for Lt shoulder Standing dowel exercises Self MLD Forearms on wall for scapular strength with yellow theraband Lt tricep ext with yellow theraband over door  ASSESSMENT:  CLINICAL IMPRESSION: Pt is functionally improving overall, but still struggles with UT compensation when trying to raise UE against gravity. She works very hard in therapy and at home. She fatigues quickly with stab exercises and shoulder ranger.  OBJECTIVE IMPAIRMENTS: decreased activity tolerance, decreased knowledge of condition, decreased knowledge of use of DME, decreased mobility, decreased ROM, and increased edema.   ACTIVITY LIMITATIONS: carrying, lifting, sleeping, bathing, dressing, and reach over head  PARTICIPATION LIMITATIONS: meal prep, cleaning, laundry, shopping, and community activity  PERSONAL FACTORS: Age and 1 comorbidity: ALND and radiation hx  are also affecting patient's functional outcome.   REHAB POTENTIAL: Excellent  CLINICAL DECISION MAKING: Evolving/moderate complexity  EVALUATION COMPLEXITY: Moderate  GOALS: Goals reviewed with patient? Yes  SHORT TERM GOALS: Target date: 04/08/23    Pt will be ind with self MLD for edema reduction Baseline: Goal status: In progress 2.  Pt  will decrease volume to or less to demonstrate improving edema status Baseline:  Goal status: MET 1596  (8 ml more)  04/30/2023 3.  Pt will be evaluated and treated for shoulder with goals added as needed as instructed by MD during future visits.  Baseline:  Goal status:MET 04/04/2023  LONG TERM GOALS: Target date: 05/10/23  Pt will reduce to less than volume difference between sides to demonstrate no active lymphedema Baseline:  Goal status: MET 04/29/2022 (1596 ml) 2.  Pt will obtain compression as needed for discharge depending on reduction Baseline:  Goal status: MET 04/11/2023   3. Pt will be able to perform AROM  elevation in supine to atleast 120 degrees without compensation  Goal Status; MET: 117 04/30/2023; 123 degrees - 05/30/23  4. Pt will be able to place 8 oz object on eye level shelf without UT compensation  GOALStatus: In progress; 05/30/23 - pt able to lift 1# half way to eye level shelf before compensation  5. Pt will be tolerant of resisted exs for improving UE strength at 7 weeks post op Goal Status; PARTIALLY MET - 05/30/23 - pt is independent with current HEP of A/AA/ROM and isometric strength, has started progressing to theraband  6. Pt will be able to return to driving for greater independence Goal status: PROGRESSING - 05/30/23 - pt is able to drive close to home (grocery store and pharmacy) but has not been able to return to highway driving that will require quick turns and maneuvering she is not able to do yet  7. Pt will be able to achieve 80-90 degrees Lt shoulder flexion for increased ease with ADLs and washing hair.  Baseline: 05/30/23 - 64 degrees   Goal Status: NEW  PLAN:  PT FREQUENCY: 2-3x/week  PT DURATION:  4 weeks  PLANNED INTERVENTIONS: 97164- PT Re-evaluation, 97110-Therapeutic exercises, 97530- Therapeutic activity, 97112- Neuromuscular re-education, 97535- Self Care, and 16109- Manual therapy  PLAN FOR NEXT SESSION:  Review weighted ball on wall and forearms on wall;  progress Lt UE and scapula strength, review and cont MLD prn; Pt needs new script for flat knit  but should wait to be measured until fibrosis further decreases.  06/25/23: Faxed script for garments to Dr. Hiram Comber, PTA 06/28/23 10:59 AM    05/14/23: Start with forearms on wall holding short yellow theraband in hands:  Pull down keeping elbow bent and squeezing shoulder blade towards spine at end of pull  2 sets of 5 each  2.   Inch forearms up wall keeping elbows in only as high as you can control by keeping the shoulder blade down  2 sets of 5 each  Next:  Throw long band over top of door, close door and pull down with Lt arm, when returning to start position keep shoulder down  Cancer Rehab [phone]

## 2023-07-02 ENCOUNTER — Ambulatory Visit: Payer: Medicare PPO

## 2023-07-02 DIAGNOSIS — Z96612 Presence of left artificial shoulder joint: Secondary | ICD-10-CM | POA: Diagnosis not present

## 2023-07-02 DIAGNOSIS — Z4789 Encounter for other orthopedic aftercare: Secondary | ICD-10-CM

## 2023-07-02 DIAGNOSIS — I972 Postmastectomy lymphedema syndrome: Secondary | ICD-10-CM

## 2023-07-02 DIAGNOSIS — M25612 Stiffness of left shoulder, not elsewhere classified: Secondary | ICD-10-CM

## 2023-07-02 NOTE — Therapy (Signed)
 OUTPATIENT PT UPPER EXTREMITY LYMPHEDEMA TREATMENT  Patient Name: Sarah Underwood MRN: [id number] DOB:06-25-1945, 78 y.o., female Today's Date: 07/02/2023  END OF SESSION:  PT End of Session - 07/02/23 1356     Visit Number 32    Number of Visits 37    Date for PT Re-Evaluation 07/11/23    Authorization Type [employer] Auth needed    Authorization Time Period new auth sent on 06/02/23; 12 visits approved 06/04/2023-07/12/2023    Authorization - Visit Number 6    Authorization - Number of Visits 12    PT Start Time 1400    PT Stop Time 1454    PT Time Calculation (min) 54 min    Activity Tolerance Patient tolerated treatment well    Behavior During Therapy [facility] for tasks assessed/performed             Past Medical History:  Diagnosis Date   Basal cell carcinoma 1994   right side of node   Benign positional vertigo    with rolling over    Celiac disease    Dysrhythmia    Generalized anxiety disorder    GERD (gastroesophageal reflux disease)    Graves disease 2007   thyroid nodule   Hard of hearing    Heart murmur    History of breast cancer 1995   left - treated with mastectomy   History of radiation therapy 2001   37 sessions following recurrence of breast cancer in left chest wall   Hypertension    Knee pain, bilateral 12/14/2014   Major depressive disorder in remission    Mild persistent asthma, uncomplicated 06/29/2015   Nocturia    OA (osteoarthritis) of hip 09/08/2015   Ovarian mass    small solid nodule 8x63mm: Right ovary - stable since 2000, follow u/s do not show this.   Renal mass 2021   followed by Dr. Berneice Heinrich, [facility].  Having every 6 month follow up   Seasonal allergies    Stress fracture of metatarsal bone of left foot 12/15/2019   Vegetarian diet    Past Surgical History:  Procedure Laterality Date   APPENDECTOMY     basal cell removal  7/94   right nose   BREAST BIOPSY Right 1987   benign fibroadenoma   breast cancer recurrence Left 01/2000   at  site of left chest wall after previous mastectomy with removal and treatment with radiaton.   CARPAL TUNNEL RELEASE Right 05/08/2013   Procedure: RIGHT CARPAL TUNNEL RELEASE;  Surgeon: Wyn Forster., MD;  Location: [facility];  Service: Orthopedics;  Laterality: Right;   CATARACT EXTRACTION     03/20/14 left eye, 12/15 right eye   COLPOSCOPY W/ BIOPSY / CURETTAGE  03/12/12   benign, but + HR HPV   EYE SURGERY     bilat cataract surgery    MASTECTOMY Left 3/95   Stage IIb 0/14 LN ER/ PR + with mastectomy - Dr. Mardella Layman   MOHS SURGERY     right nare   OVARY SURGERY  1974   lt   REVERSE SHOULDER ARTHROPLASTY Left 02/26/2023   Procedure: REVERSE SHOULDER ARTHROPLASTY;  Surgeon: Yolonda Kida, MD;  Location: [facility] OR;  Service: Orthopedics;  Laterality: Left;   TONSILLECTOMY AND ADENOIDECTOMY  1950   TOTAL HIP ARTHROPLASTY Right 09/08/2015   Procedure: TOTAL HIP ARTHROPLASTY ANTERIOR APPROACH;  Surgeon: Ollen Gross, MD;  Location: [facility] ORS;  Service: Orthopedics;  Laterality: Right;   Patient Active Problem List  Diagnosis Date Noted   Shoulder fracture 02/23/2023   Closed fracture of proximal end of left humerus, unspecified fracture morphology, initial encounter 02/22/2023   Thyroid nodule 02/22/2023   Major depressive disorder in remission 03/29/2020   Generalized anxiety disorder    Benign positional vertigo    Hypertension    Heart murmur    Stress fracture of metatarsal bone of left foot 12/15/2019   History of revision of total replacement of right hip joint 11/26/2017   Leg edema, left 09/26/2016   Post-nasal drainage 03/30/2016   OA (osteoarthritis) of hip 09/08/2015   Mild persistent asthma, uncomplicated 06/29/2015   Hip pain, bilateral 12/14/2014   Knee pain, bilateral 12/14/2014   Postmenopausal atrophic vaginitis 02/23/2013   Graves disease 2007   History of radiation therapy 2001   History of breast cancer 1995   Basal cell carcinoma 1994     PCP: Alysia Penna, MD  REFERRING PROVIDER: Duwayne Heck, MD  REFERRING DIAG:  Diagnosis  C50.511,Z17.0 (ICD-10-CM) - Malignant neoplasm of lower-outer quadrant of right breast of female, estrogen receptor positive (HCC)  I89.0 (ICD-10-CM) - Lymphedema  Z47.89 orthopedic aftercare  S/P reverse total shoulder arthroplasty, left  Encounter for other orthopedic aftercare  Stiffness of left shoulder, not elsewhere classified  THERAPY DIAG:  Postmastectomy lymphedema  S/P reverse total shoulder arthroplasty, left  Encounter for other orthopedic aftercare  Stiffness of left shoulder, not elsewhere classified  ONSET DATE: 02/26/23  Rationale for Evaluation and Treatment: Rehabilitation  SUBJECTIVE                                                                                                                                                                                           SUBJECTIVE STATEMENT:  Arm is about the same. My lower body is hurting a lot so my shoulder is taking a beating because I have to use the cane a lot.    PERTINENT HISTORY: Recent fall with left arm fracture.  Surgery with Reverse TSR 02/26/23.  This is the same side as her mastectomy which was diagnosed in 1995 with chest wall recurrence in 2001.  No remaining lymph nodes.   Shoulder surgery: Dr. Duwayne Heck -See media section for post-op approved exercises and okay for compression therapy.    PAIN:  PAIN:  Are you having pain? No, I've adjusted to the cane   PRECAUTIONS: cleared for all ROM/activity of the shoulder per MD  RED FLAGS: None   WEIGHT BEARING RESTRICTIONS: No  FALLS:  Has patient fallen in last 6 months? Yes. Number of falls 1 - does not remember details of her fall. Just remembers she  was going outside.   LIVING ENVIRONMENT: Lives with: lives alone Has a CNA for assistance at home.   OCCUPATION: Retired  LEISURE: Not currently exercising  HAND DOMINANCE :  Right  PRIOR LEVEL OF FUNCTION: Independent  PATIENT GOALS: prevent chronic lymphedema after this fall   OBJECTIVE (FROM EVAL) Note: Objective measures were completed at Evaluation unless otherwise noted.  COGNITION:  Overall cognitive status: Within functional limits for tasks assessed   PALPATION: +2 pitting edema back of hand, forearm, below, to around the distal 1/3rd of the upper arm.   OBSERVATIONS / OTHER ASSESSMENTS: bruising all fingers and hand, elbow, and upper arm from surgery.  Skin tight and shiny from edema.     POSTURE: wearing sling upon arrival and leaving  UPPER EXTREMITY AROM/PROM: Just starting wrist and elbow AROM - did not measure objectively.  No shoulder ROM allowed.   05/30/23:Left UE AAROM elevation in supine=   LEFT UE AROM elevation (scaption) in supine: 123    LEFT ER AROM supine:27   LEFT IR AROM in standing: fingertips to medial glut fold and thumb reaches glut crease   LEFT UE FLEX in standing pt can briefly hold at 64 degrees when therapist assists with concentric contraction  04/04/2023 Left UE AAROM elevation in supine=   LEFT UE AROM elevation in supine: 100  04/11/2023   LEFT ER AROM supine:17   LEFT IR AROM supine:68 30 deg abd      IN SUPINE 04/30/2023 Left shoulder flex AROM in supine;  117 with elbow flexed to initiate   IR AROM  to stomach    ER  AROM 30 degrees at 30 degrees abduction     LYMPHEDEMA ASSESSMENTS:  LANDMARK RIGHT eval  At axilla 30.4  15cm proximal to olecranon 29.4  10 cm proximal to olecranon process 28.3  Olecranon process 24.7  15cm proximal to ulnar styloid 22.1  10 cm proximal to ulnar styloid process 18.7  Just proximal to ulnar styloid process 16.2  Across hand at thumb web space 19.8  At base of 2nd digit 6.4  (Blank rows = not tested)  LANDMARK LEFT  eval LEFT 03/26/2023 LEFT 04/11/2023 LEFT  04/30/2023 LEFT 05/09/23 Left 05/23/23  At axilla 33 31 29.6 29.7 29.7 30.3  15cm proximal to olecranon  32.5 29.4 28.8 28.4 27.8 27.8  10 cm proximal to olecranon process 34.5 30.2 28.0 27.6 27.4 27.4  Olecranon process 30.5 26.6 24.0 23.4 23.3 22.9  15cm proximal to ulnar styloid  28.7 25.6 23.0 23.0 23.3 22.9  10 cm proximal to ulnar styloid process 26.5 23.4 20.5 21.1 21.9 22  Just proximal to ulnar styloid process 18.5 17.2 15.9 15.8 15.9 15.7  Across hand at thumb web space 20.4 18.8 19.2 19.0 18.4 18.8  At base of 2nd digit 7.0 6.1 5.85 6.0 5.9 5.9  (Blank rows = not tested)  Volume (at eval):          Length 7cm Rt:                         Lt:                      04/30/2023  1596 ml difference   PATIENT SURVEYS:  Quick Dash 68% limited EVAL 04/29/2022     34%   TODAY'S TREATMENT:  DATE:  07/02/2023 Therapeutic Exercises UE Ranger 2x10 flexion and scaption Finger ladder x 5, to 31 Half foam roll flexion x 10, horizontal abd x 10 stopping before table so she could raise up her in  horizontal abd, snow angles x 8 Neuromuscular Re-ed Small blue un weighted ball on wall for up/down x 20 sec and then side/side x 12 sec but pt required 3 brief rests during due to Lt UE fatigue Lt UE wall slide into flexion then into controlled eccentric abd x 5, very challenging for pt due to fatigue Red overdor TB, x 10 for left shoulder flexion assist Manual Therapy P/ROM to Lt shoulder into flex, scaption and er/IR all to tolerance with scapular depression by therapist STM over TSR incision and to Lt pect insertion and left lateral arm  06/28/2023 Therapeutic Exercises UE Ranger x 3 mins into flex, then scaption Finger Ladder to #32 (which is as high as pt could reach due to height so full A/ROM) x 5 Half foam roll flexion x 8, horizontal abd x 8 stopping before table so she could raise up her in  horizontal abd, snow angels x  8 Neuromuscular Small gn weighted ball on wall for up/down x 30 sec and then side/side x 12 sec but pt required 3 brief rests during due to Lt UE fatigue Lt UE wall slide into flexion then into controlled eccentric abd x 5, very challenging for pt due to fatigue Manual Therapy P/ROM to Lt shoulder into flex, scaption and er/IR all to tolerance with scapular depression by therapist STM over TSR incision and to Lt pect insertion  06/25/23: Therapeutic Exercises Pulleys for warm up into flex and abd x 3 mins total with VC's to remind pt to decrease Lt scapular compensations throughout UE Ranger x 3 mins into flex, then scaption Finger Ladder to #32 (which is as high as pt could reach due to height so full A/ROM) x 5 Therapeutic Activity Small green weighted ball on wall for up/down x 30 sec and then side/side x 30 sec but pt required 3 brief rests during due to Lt UE fatigue Lt UE wall slide into flexion then into controlled eccentric abd x 4, very challenging for pt due to fatigue TE cont'd Wall stretches into flexion Lt UE and bil UE's Supine over half foam roll for following: Bil UE horz abd 2 x 8; then bil UE abd into a "snow angel" 2 x 7 with VC's to relax Manual Therapy P/ROM to Lt shoulder into flex, scaption and er/IR all to tolerance with scapular depression by therapist STM over TSR incision and to Lt pect insertion, and mid delt with cocoa butter  06/11/23: Therapeutic Exercises Finger Ladder into flex x 3 mins Pulleys 2 x 15 each into flex and scaption, horz abd 2 x 15 Finger Ladder Lt UE flex x 3 reps up to #29 Forearms on wall for alt scap retraction x 5 each side, this was very challenging for pt as she was still very tight in shoulder Standing Lt IR reaching behind to glut and pt can now reach superior Lt glut Trial of doorway pectoralis stretch for er and pect. Pt limited in motion but is able to do this Manual Therapy MLD in supine: Short neck, 5 diaphragmatic breaths, Rt  axillary nodes, anterior inter-axillary anastomosis, then Lt upper arm redirecting towards anastomosis P/ROM to Lt shoulder into flex, scaption and er/IR all to tolerance with scapular depression by therapist STM with cocoa butter to Lt  shoulder, upper arm and UT's   06/07/23: Therapeutic Exercises Pt demonstrated Lt bicep curls with yellow theraband, educated her it's also important to strengthen the tricep  Tricep extension with yell band over top of door x 10 reps Modified prone (bed elevated so pt could lay upper body on bed as unable to lay prone) for Lt UE ext with scap retract at end motion x 10, then Lt UE abd as able and tactile cuing for correct scapular engagement without UT x 10 UE Ranger for LT UE flex focusing on decreasing scapular compensation x 3 mins Finger Ladder into Lt YUE flex x 4 up to #29 Lt tricep ext again with yellow band over door x 20 with tactile cuing for correct UE position and Vcs for tricep squeeze at end motion, then Lt bil UE ext in larger ROM x 20 Manual Therapy P/ROM of Lt shoulder into flex, abd and er/IR all to tolerance with scapular depression by therapist STM with cocoa butter to Lt upper arm, lats, and deltoid muscles  06/04/2023 Pulleys x 2 min flexion and scaption to improve ROM Shoulder ranger x 4 min and scaption x 2 min with TC to depress scapula Finger ladder 5 times Left ebow flexion yellow x 10 Reach to eye level shelf;TC's by PT to encourage scapular depression, and pt assisting with 2 fingers in palm x 5-6 reps MANUAL P/ROM of Lt shoulder into flex, abd and er/IR all to tolerance with scapular depression by therapist STM with cocoa butter to Lt upper arm, lats, and deltoid muscles Assisted pt with velcro wrap and cover  06/01/23: Therapeutic Exercises Pulleys for review to assess technique x 1 min into flex and scaption with cues to relax at top of motions as pt is compensating at that point UE Ranger into flex x 4 mins and VC's to  decrease scapular compensation top of motion Finger Ladder into flex x 3 up to #29, then Lt scaption x 1 up to #29 Manual Therapy  P/ROM of Lt shoulder into flex, abd and er/IR all to tolerance with scapular depression by therapist STM with cocoa butter to Lt upper arm, lats,UT and deltoid muscles      PATIENT EDUCATION:  Access Code: [email or web address] URL: [email or web address] Date: 04/13/2023 Prepared by: Alvira Monday  Exercises - Standing Isometric Shoulder Internal Rotation at Doorway  - 1 x daily - 7 x weekly - 3 sets - 10 reps - Standing Isometric Shoulder External Rotation with Doorway  - 1 x daily - 7 x weekly - 1 sets - 5-10 reps - Isometric Shoulder Flexion at Wall  - 1 x daily - 7 x weekly - 1 sets - 5-10 reps - 5 hold - Isometric Shoulder Extension at Wall  - 1 x daily - 7 x weekly - 1 sets - 5-10 reps - Isometric Shoulder Abduction at Wall  - 1 x daily - 7 x weekly - 1 sets - 5-10 reps   05/02/23: Education details: Tricep ext with band over top of door Person educated: Patient Education method: Explanation, Demonstration, and Verbal cues, and tactile cues Education comprehension: verbalized understanding, returned demonstration, tactile and VC's   HOME EXERCISE PROGRAM: Scapular retraction, shoulder extension yellow band x 10 Supine alphabet Isometrics for Lt shoulder Standing dowel exercises Self MLD Forearms on wall for scapular strength with yellow theraband Lt tricep ext with yellow theraband over door  ASSESSMENT:  CLINICAL IMPRESSION:  Pt did very well with shoulder ranger today, but has more difficulty with  scaption than flexion. She was very fatigued with ball rolls and used an unweighted ball today. PROM is progressing nicely and pt is doing well without wearing her compression sleeve.  OBJECTIVE IMPAIRMENTS: decreased activity tolerance, decreased knowledge of condition, decreased knowledge of use of DME, decreased mobility, decreased ROM, and  increased edema.   ACTIVITY LIMITATIONS: carrying, lifting, sleeping, bathing, dressing, and reach over head  PARTICIPATION LIMITATIONS: meal prep, cleaning, laundry, shopping, and community activity  PERSONAL FACTORS: Age and 1 comorbidity: ALND and radiation hx  are also affecting patient's functional outcome.   REHAB POTENTIAL: Excellent  CLINICAL DECISION MAKING: Evolving/moderate complexity  EVALUATION COMPLEXITY: Moderate  GOALS: Goals reviewed with patient? Yes  SHORT TERM GOALS: Target date: 04/08/23    Pt will be ind with self MLD for edema reduction Baseline: Goal status: In progress 2.  Pt will decrease volume to or less to demonstrate improving edema status Baseline:  Goal status: MET 1596  (8 ml more) 04/30/2023 3.  Pt will be evaluated and treated for shoulder with goals added as needed as instructed by MD during future visits.  Baseline:  Goal status:MET 04/04/2023  LONG TERM GOALS: Target date: 05/10/23  Pt will reduce to less than volume difference between sides to demonstrate no active lymphedema Baseline:  Goal status: MET 04/29/2022 (1596 ml) 2.  Pt will obtain compression as needed for discharge depending on reduction Baseline:  Goal status: MET 04/11/2023   3. Pt will be able to perform AROM  elevation in supine to atleast 120 degrees without compensation  Goal Status; MET: 117 04/30/2023; 123 degrees - 05/30/23  4. Pt will be able to place 8 oz object on eye level shelf without UT compensation  GOALStatus: In progress; 05/30/23 - pt able to lift 1# half way to eye level shelf before compensation  5. Pt will be tolerant of resisted exs for improving UE strength at 7 weeks post op Goal Status; PARTIALLY MET - 05/30/23 - pt is independent with current HEP of A/AA/ROM and isometric strength, has started progressing to theraband  6. Pt will be able to return to driving for greater independence Goal status: PROGRESSING - 05/30/23 - pt is able to drive  close to home (grocery store and pharmacy) but has not been able to return to highway driving that will require quick turns and maneuvering she is not able to do yet  7. Pt will be able to achieve 80-90 degrees Lt shoulder flexion for increased ease with ADLs and washing hair.  Baseline: 05/30/23 - 64 degrees   Goal Status: NEW  PLAN:  PT FREQUENCY: 2-3x/week  PT DURATION:  4 weeks  PLANNED INTERVENTIONS: 97164- PT Re-evaluation, 97110-Therapeutic exercises, 97530- Therapeutic activity, 97112- Neuromuscular re-education, 97535- Self Care, and 78295- Manual therapy  PLAN FOR NEXT SESSION:  Review weighted ball on wall and forearms on wall;  progress Lt UE and scapula strength, review and cont MLD prn; Pt needs new script for flat knit but should wait to be measured until fibrosis further decreases.  06/25/23: Faxed script for garments to Dr. Hiram Comber, PTA 07/02/23 3:00 PM    05/14/23: Start with forearms on wall holding short yellow theraband in hands:  Pull down keeping elbow bent and squeezing shoulder blade towards spine at end of pull  2 sets of 5 each  2.   Inch forearms up wall keeping elbows in only as high as you can control by keeping the shoulder blade down  2  sets of 5 each  Next:  Throw long band over top of door, close door and pull down with Lt arm, when returning to start position keep shoulder down  Cancer Rehab [phone]

## 2023-07-05 ENCOUNTER — Ambulatory Visit: Payer: Medicare PPO

## 2023-07-05 DIAGNOSIS — Z96612 Presence of left artificial shoulder joint: Secondary | ICD-10-CM

## 2023-07-05 DIAGNOSIS — I972 Postmastectomy lymphedema syndrome: Secondary | ICD-10-CM

## 2023-07-05 DIAGNOSIS — Z4789 Encounter for other orthopedic aftercare: Secondary | ICD-10-CM | POA: Diagnosis not present

## 2023-07-05 DIAGNOSIS — M25612 Stiffness of left shoulder, not elsewhere classified: Secondary | ICD-10-CM | POA: Diagnosis not present

## 2023-07-05 NOTE — Therapy (Signed)
 OUTPATIENT PT UPPER EXTREMITY LYMPHEDEMA TREATMENT  Patient Name: Sarah Underwood MRN: [id number] DOB:[birth year], 78 y.o., female Today's Date: 07/05/2023  END OF SESSION:  PT End of Session - 07/05/23 1112     Visit Number 33    Number of Visits 37    Date for PT Re-Evaluation 07/11/23    Authorization Type [employer] Auth needed    Authorization Time Period new auth sent on 06/02/23; 12 visits approved 06/04/2023-07/12/2023    Authorization - Visit Number 7    Authorization - Number of Visits 12    PT Start Time 1104    PT Stop Time 1159    PT Time Calculation (min) 55 min    Activity Tolerance Patient tolerated treatment well    Behavior During Therapy [facility] for tasks assessed/performed             Past Medical History:  Diagnosis Date   Basal cell carcinoma 1994   right side of node   Benign positional vertigo    with rolling over    Celiac disease    Dysrhythmia    Generalized anxiety disorder    GERD (gastroesophageal reflux disease)    Graves disease 2007   thyroid nodule   Hard of hearing    Heart murmur    History of breast cancer 1995   left - treated with mastectomy   History of radiation therapy 2001   37 sessions following recurrence of breast cancer in left chest wall   Hypertension    Knee pain, bilateral 12/14/2014   Major depressive disorder in remission    Mild persistent asthma, uncomplicated 06/29/2015   Nocturia    OA (osteoarthritis) of hip 09/08/2015   Ovarian mass    small solid nodule 8x41mm: Right ovary - stable since 2000, follow u/s do not show this.   Renal mass 2021   followed by Dr. Berneice Heinrich, [facility].  Having every 6 month follow up   Seasonal allergies    Stress fracture of metatarsal bone of left foot 12/15/2019   Vegetarian diet    Past Surgical History:  Procedure Laterality Date   APPENDECTOMY     basal cell removal  7/94   right nose   BREAST BIOPSY Right 1987   benign fibroadenoma   breast cancer recurrence Left 01/2000   at  site of left chest wall after previous mastectomy with removal and treatment with radiaton.   CARPAL TUNNEL RELEASE Right 05/08/2013   Procedure: RIGHT CARPAL TUNNEL RELEASE;  Surgeon: Wyn Forster., MD;  Location: [facility];  Service: Orthopedics;  Laterality: Right;   CATARACT EXTRACTION     03/20/14 left eye, 12/15 right eye   COLPOSCOPY W/ BIOPSY / CURETTAGE  03/12/12   benign, but + HR HPV   EYE SURGERY     bilat cataract surgery    MASTECTOMY Left 3/95   Stage IIb 0/14 LN ER/ PR + with mastectomy - Dr. Mardella Layman   MOHS SURGERY     right nare   OVARY SURGERY  1974   lt   REVERSE SHOULDER ARTHROPLASTY Left 02/26/2023   Procedure: REVERSE SHOULDER ARTHROPLASTY;  Surgeon: Yolonda Kida, MD;  Location: [facility] OR;  Service: Orthopedics;  Laterality: Left;   TONSILLECTOMY AND ADENOIDECTOMY  1950   TOTAL HIP ARTHROPLASTY Right 09/08/2015   Procedure: TOTAL HIP ARTHROPLASTY ANTERIOR APPROACH;  Surgeon: Ollen Gross, MD;  Location: [facility] ORS;  Service: Orthopedics;  Laterality: Right;   Patient Active Problem List  Diagnosis Date Noted   Shoulder fracture 02/23/2023   Closed fracture of proximal end of left humerus, unspecified fracture morphology, initial encounter 02/22/2023   Thyroid nodule 02/22/2023   Major depressive disorder in remission 03/29/2020   Generalized anxiety disorder    Benign positional vertigo    Hypertension    Heart murmur    Stress fracture of metatarsal bone of left foot 12/15/2019   History of revision of total replacement of right hip joint 11/26/2017   Leg edema, left 09/26/2016   Post-nasal drainage 03/30/2016   OA (osteoarthritis) of hip 09/08/2015   Mild persistent asthma, uncomplicated 06/29/2015   Hip pain, bilateral 12/14/2014   Knee pain, bilateral 12/14/2014   Postmenopausal atrophic vaginitis 02/23/2013   Graves disease 2007   History of radiation therapy 2001   History of breast cancer 1995   Basal cell carcinoma 1994     PCP: Alysia Penna, MD  REFERRING PROVIDER: Duwayne Heck, MD  REFERRING DIAG:  Diagnosis  C50.511,Z17.0 (ICD-10-CM) - Malignant neoplasm of lower-outer quadrant of right breast of female, estrogen receptor positive (HCC)  I89.0 (ICD-10-CM) - Lymphedema  Z47.89 orthopedic aftercare  S/P reverse total shoulder arthroplasty, left  Encounter for other orthopedic aftercare  Stiffness of left shoulder, not elsewhere classified  THERAPY DIAG:  Postmastectomy lymphedema  S/P reverse total shoulder arthroplasty, left  Encounter for other orthopedic aftercare  Stiffness of left shoulder, not elsewhere classified  ONSET DATE: 02/26/23  Rationale for Evaluation and Treatment: Rehabilitation  SUBJECTIVE                                                                                                                                                                                           SUBJECTIVE STATEMENT:  I am so excited. I was finally able to use the rowing machine at the gym and did 3 x 10 with 10#. I also am able to go forward on the UBE without helping the arm with my other hand to get up to it. Backwards is still challenge.     PERTINENT HISTORY: Recent fall with left arm fracture.  Surgery with Reverse TSR 02/26/23.  This is the same side as her mastectomy which was diagnosed in 1995 with chest wall recurrence in 2001.  No remaining lymph nodes.   Shoulder surgery: Dr. Duwayne Heck -See media section for post-op approved exercises and okay for compression therapy.    PAIN:  PAIN:  Are you having pain? No, I've adjusted to the cane   PRECAUTIONS: cleared for all ROM/activity of the shoulder per MD  RED FLAGS: None   WEIGHT BEARING RESTRICTIONS: No  FALLS:  Has patient fallen in last 6 months? Yes. Number of falls 1 - does not remember details of her fall. Just remembers she was going outside.   LIVING ENVIRONMENT: Lives with: lives alone Has a CNA for  assistance at home.   OCCUPATION: Retired  LEISURE: Not currently exercising  HAND DOMINANCE : Right  PRIOR LEVEL OF FUNCTION: Independent  PATIENT GOALS: prevent chronic lymphedema after this fall   OBJECTIVE (FROM EVAL) Note: Objective measures were completed at Evaluation unless otherwise noted.  COGNITION:  Overall cognitive status: Within functional limits for tasks assessed   PALPATION: +2 pitting edema back of hand, forearm, below, to around the distal 1/3rd of the upper arm.   OBSERVATIONS / OTHER ASSESSMENTS: bruising all fingers and hand, elbow, and upper arm from surgery.  Skin tight and shiny from edema.     POSTURE: wearing sling upon arrival and leaving  UPPER EXTREMITY AROM/PROM: Just starting wrist and elbow AROM - did not measure objectively.  No shoulder ROM allowed.   05/30/23:Left UE AAROM elevation in supine=   LEFT UE AROM elevation (scaption) in supine: 123    LEFT ER AROM supine:27   LEFT IR AROM in standing: fingertips to medial glut fold and thumb reaches glut crease   LEFT UE FLEX in standing pt can briefly hold at 64 degrees when therapist assists with concentric contraction  04/04/2023 Left UE AAROM elevation in supine=   LEFT UE AROM elevation in supine: 100  04/11/2023   LEFT ER AROM supine:17   LEFT IR AROM supine:68 30 deg abd      IN SUPINE 04/30/2023 Left shoulder flex AROM in supine;  117 with elbow flexed to initiate   IR AROM  to stomach    ER  AROM 30 degrees at 30 degrees abduction     LYMPHEDEMA ASSESSMENTS:  LANDMARK RIGHT eval  At axilla 30.4  15cm proximal to olecranon 29.4  10 cm proximal to olecranon process 28.3  Olecranon process 24.7  15cm proximal to ulnar styloid 22.1  10 cm proximal to ulnar styloid process 18.7  Just proximal to ulnar styloid process 16.2  Across hand at thumb web space 19.8  At base of 2nd digit 6.4  (Blank rows = not tested)  LANDMARK LEFT  eval LEFT 03/26/2023 LEFT 04/11/2023 LEFT   04/30/2023 LEFT 05/09/23 Left 05/23/23  At axilla 33 31 29.6 29.7 29.7 30.3  15cm proximal to olecranon 32.5 29.4 28.8 28.4 27.8 27.8  10 cm proximal to olecranon process 34.5 30.2 28.0 27.6 27.4 27.4  Olecranon process 30.5 26.6 24.0 23.4 23.3 22.9  15cm proximal to ulnar styloid  28.7 25.6 23.0 23.0 23.3 22.9  10 cm proximal to ulnar styloid process 26.5 23.4 20.5 21.1 21.9 22  Just proximal to ulnar styloid process 18.5 17.2 15.9 15.8 15.9 15.7  Across hand at thumb web space 20.4 18.8 19.2 19.0 18.4 18.8  At base of 2nd digit 7.0 6.1 5.85 6.0 5.9 5.9  (Blank rows = not tested)  Volume (at eval):          Length 7cm Rt:                         Lt:                      04/30/2023  1596 ml difference   PATIENT SURVEYS:  Quick Dash 68% limited EVAL 04/29/2022  34%   TODAY'S TREATMENT:                                                                                                                                         DATE:  07/05/23: Therapeutic Exercises Pulleys into flex and abd x 2 mins each Assessed how pt is doing certain Lt UE exs at the gym. Advised her to try bicep curls against wall.  UE Ranger and added 1# to wrist for Lt UE flex x 2 mins Finger Ladder flex x 5 reps up to #31 Lt pectoralis stretch x 2, 10-15 sec holds Manual Therapy P/ROM to Lt shoulder into flex, scaption and er/IR all to tolerance with scapular depression by therapist STM over TSR incision and to Lt pect insertion and left lateral arm with cocoa butter  07/02/2023 Therapeutic Exercises UE Ranger 2x10 flexion and scaption Finger ladder x 5, to 31 Half foam roll flexion x 10, horizontal abd x 10 stopping before table so she could raise up her in  horizontal abd, snow angles x 8 Neuromuscular Re-ed Small blue un weighted ball on wall for up/down x 20 sec and then side/side x 12 sec but pt required 3 brief rests during due to Lt UE fatigue Lt UE wall slide into flexion then into  controlled eccentric abd x 5, very challenging for pt due to fatigue Red overdor TB, x 10 for left shoulder flexion assist Manual Therapy P/ROM to Lt shoulder into flex, scaption and er/IR all to tolerance with scapular depression by therapist STM over TSR incision and to Lt pect insertion and left lateral arm  06/28/2023 Therapeutic Exercises UE Ranger x 3 mins into flex, then scaption Finger Ladder to #32 (which is as high as pt could reach due to height so full A/ROM) x 5 Half foam roll flexion x 8, horizontal abd x 8 stopping before table so she could raise up her in  horizontal abd, snow angels x 8 Neuromuscular Small gn weighted ball on wall for up/down x 30 sec and then side/side x 12 sec but pt required 3 brief rests during due to Lt UE fatigue Lt UE wall slide into flexion then into controlled eccentric abd x 5, very challenging for pt due to fatigue Manual Therapy P/ROM to Lt shoulder into flex, scaption and er/IR all to tolerance with scapular depression by therapist STM over TSR incision and to Lt pect insertion     PATIENT EDUCATION:  Access Code: [email or web address] URL: [email or web address] Date: 04/13/2023 Prepared by: Alvira Monday  Exercises - Standing Isometric Shoulder Internal Rotation at Doorway  - 1 x daily - 7 x weekly - 3 sets - 10 reps - Standing Isometric Shoulder External Rotation with Doorway  - 1 x daily - 7 x weekly - 1 sets - 5-10 reps - Isometric Shoulder Flexion at Wall  - 1 x daily - 7 x weekly -  1 sets - 5-10 reps - 5 hold - Isometric Shoulder Extension at Wall  - 1 x daily - 7 x weekly - 1 sets - 5-10 reps - Isometric Shoulder Abduction at Wall  - 1 x daily - 7 x weekly - 1 sets - 5-10 reps   05/02/23: Education details: Tricep ext with band over top of door Person educated: Patient Education method: Explanation, Demonstration, and Verbal cues, and tactile cues Education comprehension: verbalized understanding, returned demonstration, tactile  and VC's   HOME EXERCISE PROGRAM: Scapular retraction, shoulder extension yellow band x 10 Supine alphabet Isometrics for Lt shoulder Standing dowel exercises Self MLD Forearms on wall for scapular strength with yellow theraband Lt tricep ext with yellow theraband over door  ASSESSMENT:  CLINICAL IMPRESSION: Pt is making great progress with her exercises at the gym and is feeling improvement with her A/ROM at home with certain tasks. Today continued with AA/ROM and assessed technique of some of her gym exercises. Then continued with manual therapy working to further decrease Lt upper quadrant tightness and limitations.   OBJECTIVE IMPAIRMENTS: decreased activity tolerance, decreased knowledge of condition, decreased knowledge of use of DME, decreased mobility, decreased ROM, and increased edema.   ACTIVITY LIMITATIONS: carrying, lifting, sleeping, bathing, dressing, and reach over head  PARTICIPATION LIMITATIONS: meal prep, cleaning, laundry, shopping, and community activity  PERSONAL FACTORS: Age and 1 comorbidity: ALND and radiation hx  are also affecting patient's functional outcome.   REHAB POTENTIAL: Excellent  CLINICAL DECISION MAKING: Evolving/moderate complexity  EVALUATION COMPLEXITY: Moderate  GOALS: Goals reviewed with patient? Yes  SHORT TERM GOALS: Target date: 04/08/23    Pt will be ind with self MLD for edema reduction Baseline: Goal status: In progress 2.  Pt will decrease volume to or less to demonstrate improving edema status Baseline:  Goal status: MET 1596  (8 ml more) 04/30/2023 3.  Pt will be evaluated and treated for shoulder with goals added as needed as instructed by MD during future visits.  Baseline:  Goal status:MET 04/04/2023  LONG TERM GOALS: Target date: 05/10/23  Pt will reduce to less than volume difference between sides to demonstrate no active lymphedema Baseline:  Goal status: MET 04/29/2022 (1596 ml) 2.  Pt will obtain  compression as needed for discharge depending on reduction Baseline:  Goal status: MET 04/11/2023   3. Pt will be able to perform AROM  elevation in supine to atleast 120 degrees without compensation  Goal Status; MET: 117 04/30/2023; 123 degrees - 05/30/23  4. Pt will be able to place 8 oz object on eye level shelf without UT compensation  GOALStatus: In progress; 05/30/23 - pt able to lift 1# half way to eye level shelf before compensation  5. Pt will be tolerant of resisted exs for improving UE strength at 7 weeks post op Goal Status; PARTIALLY MET - 05/30/23 - pt is independent with current HEP of A/AA/ROM and isometric strength, has started progressing to theraband  6. Pt will be able to return to driving for greater independence Goal status: PROGRESSING - 05/30/23 - pt is able to drive close to home (grocery store and pharmacy) but has not been able to return to highway driving that will require quick turns and maneuvering she is not able to do yet  7. Pt will be able to achieve 80-90 degrees Lt shoulder flexion for increased ease with ADLs and washing hair.  Baseline: 05/30/23 - 64 degrees   Goal Status: NEW  PLAN:  PT FREQUENCY: 2-3x/week  PT DURATION:  4 weeks  PLANNED INTERVENTIONS: 97164- PT Re-evaluation, 97110-Therapeutic exercises, 97530- Therapeutic activity, 97112- Neuromuscular re-education, 97535- Self Care, and 16109- Manual therapy  PLAN FOR NEXT SESSION:  Review weighted ball on wall and forearms on wall;  progress Lt UE and scapula strength, review and cont MLD prn; Pt needs new script for flat knit but should wait to be measured until fibrosis further decreases.  06/25/23: Faxed script for garments to Dr. Hiram Comber, PTA 07/05/23 12:18 PM    05/14/23: Start with forearms on wall holding short yellow theraband in hands:  Pull down keeping elbow bent and squeezing shoulder blade towards spine at end of pull  2 sets of 5 each  2.   Inch forearms up  wall keeping elbows in only as high as you can control by keeping the shoulder blade down  2 sets of 5 each  Next:  Throw long band over top of door, close door and pull down with Lt arm, when returning to start position keep shoulder down  Cancer Rehab [phone]

## 2023-07-09 ENCOUNTER — Ambulatory Visit: Payer: Medicare PPO

## 2023-07-09 DIAGNOSIS — M25612 Stiffness of left shoulder, not elsewhere classified: Secondary | ICD-10-CM

## 2023-07-09 DIAGNOSIS — Z4789 Encounter for other orthopedic aftercare: Secondary | ICD-10-CM | POA: Diagnosis not present

## 2023-07-09 DIAGNOSIS — Z96612 Presence of left artificial shoulder joint: Secondary | ICD-10-CM

## 2023-07-09 DIAGNOSIS — I972 Postmastectomy lymphedema syndrome: Secondary | ICD-10-CM | POA: Diagnosis not present

## 2023-07-09 NOTE — Therapy (Signed)
 OUTPATIENT PT UPPER EXTREMITY LYMPHEDEMA TREATMENT  Patient Name: Sarah Underwood MRN: [id number] DOB:Sep 20, 1945, 78 y.o., female Today's Date: 07/09/2023  END OF SESSION:  PT End of Session - 07/09/23 1107     Visit Number 34    Number of Visits 37    Date for PT Re-Evaluation 07/11/23    Authorization Type [employer] Auth needed    Authorization Time Period new auth sent on 06/02/23; 12 visits approved 06/04/2023-07/12/2023    Authorization - Visit Number 8    Authorization - Number of Visits 12    Progress Note Due on Visit 39    PT Start Time 1104    PT Stop Time 1200    PT Time Calculation (min) 56 min    Activity Tolerance Patient tolerated treatment well    Behavior During Therapy WFL for tasks assessed/performed             Past Medical History:  Diagnosis Date   Basal cell carcinoma 1994   right side of node   Benign positional vertigo    with rolling over    Celiac disease    Dysrhythmia    Generalized anxiety disorder    GERD (gastroesophageal reflux disease)    Graves disease 2007   thyroid nodule   Hard of hearing    Heart murmur    History of breast cancer 1995   left - treated with mastectomy   History of radiation therapy 2001   37 sessions following recurrence of breast cancer in left chest wall   Hypertension    Knee pain, bilateral 12/14/2014   Major depressive disorder in remission    Mild persistent asthma, uncomplicated 06/29/2015   Nocturia    OA (osteoarthritis) of hip 09/08/2015   Ovarian mass    small solid nodule 8x14mm: Right ovary - stable since 2000, follow u/s do not show this.   Renal mass 2021   followed by Dr. Berneice Heinrich, [facility].  Having every 6 month follow up   Seasonal allergies    Stress fracture of metatarsal bone of left foot 12/15/2019   Vegetarian diet    Past Surgical History:  Procedure Laterality Date   APPENDECTOMY     basal cell removal  7/94   right nose   BREAST BIOPSY Right 1987   benign fibroadenoma   breast  cancer recurrence Left 01/2000   at site of left chest wall after previous mastectomy with removal and treatment with radiaton.   CARPAL TUNNEL RELEASE Right 05/08/2013   Procedure: RIGHT CARPAL TUNNEL RELEASE;  Surgeon: Wyn Forster., MD;  Location: [facility];  Service: Orthopedics;  Laterality: Right;   CATARACT EXTRACTION     03/20/14 left eye, 12/15 right eye   COLPOSCOPY W/ BIOPSY / CURETTAGE  03/12/12   benign, but + HR HPV   EYE SURGERY     bilat cataract surgery    MASTECTOMY Left 3/95   Stage IIb 0/14 LN ER/ PR + with mastectomy - Dr. Mardella Layman   MOHS SURGERY     right nare   OVARY SURGERY  1974   lt   REVERSE SHOULDER ARTHROPLASTY Left 02/26/2023   Procedure: REVERSE SHOULDER ARTHROPLASTY;  Surgeon: Yolonda Kida, MD;  Location: [facility] OR;  Service: Orthopedics;  Laterality: Left;   TONSILLECTOMY AND ADENOIDECTOMY  1950   TOTAL HIP ARTHROPLASTY Right 09/08/2015   Procedure: TOTAL HIP ARTHROPLASTY ANTERIOR APPROACH;  Surgeon: Ollen Gross, MD;  Location: [facility];  Service: Orthopedics;  Laterality: Right;   Patient Active Problem List   Diagnosis Date Noted   Shoulder fracture 02/23/2023   Closed fracture of proximal end of left humerus, unspecified fracture morphology, initial encounter 02/22/2023   Thyroid nodule 02/22/2023   Major depressive disorder in remission 03/29/2020   Generalized anxiety disorder    Benign positional vertigo    Hypertension    Heart murmur    Stress fracture of metatarsal bone of left foot 12/15/2019   History of revision of total replacement of right hip joint 11/26/2017   Leg edema, left 09/26/2016   Post-nasal drainage 03/30/2016   OA (osteoarthritis) of hip 09/08/2015   Mild persistent asthma, uncomplicated 06/29/2015   Hip pain, bilateral 12/14/2014   Knee pain, bilateral 12/14/2014   Postmenopausal atrophic vaginitis 02/23/2013   Graves disease 2007   History of radiation therapy 2001   History of breast cancer  1995   Basal cell carcinoma 1994    PCP: Alysia Penna, MD  REFERRING PROVIDER: Duwayne Heck, MD  REFERRING DIAG:  Diagnosis  C50.511,Z17.0 (ICD-10-CM) - Malignant neoplasm of lower-outer quadrant of right breast of female, estrogen receptor positive (HCC)  I89.0 (ICD-10-CM) - Lymphedema  Z47.89 orthopedic aftercare  S/P reverse total shoulder arthroplasty, left  Encounter for other orthopedic aftercare  Stiffness of left shoulder, not elsewhere classified  THERAPY DIAG:  Postmastectomy lymphedema  S/P reverse total shoulder arthroplasty, left  Encounter for other orthopedic aftercare  Stiffness of left shoulder, not elsewhere classified  ONSET DATE: 02/26/23  Rationale for Evaluation and Treatment: Rehabilitation  SUBJECTIVE                                                                                                                                                                                           SUBJECTIVE STATEMENT:  I only got to the gym twice last week. I feel like doing the UBE has really made a big difference in my strength and fluidity of movement. I have started noticing my strength is limited the higher I reach because I can finally reach high enough to notice!    PERTINENT HISTORY: Recent fall with left arm fracture.  Surgery with Reverse TSR 02/26/23.  This is the same side as her mastectomy which was diagnosed in 1995 with chest wall recurrence in 2001.  No remaining lymph nodes.   Shoulder surgery: Dr. Duwayne Heck -See media section for post-op approved exercises and okay for compression therapy.    PAIN:  PAIN:  Are you having pain? No, I've adjusted to the cane   PRECAUTIONS: cleared for all ROM/activity of the shoulder per MD  RED FLAGS: None  WEIGHT BEARING RESTRICTIONS: No  FALLS:  Has patient fallen in last 6 months? Yes. Number of falls 1 - does not remember details of her fall. Just remembers she was going outside.   LIVING  ENVIRONMENT: Lives with: lives alone Has a CNA for assistance at home.   OCCUPATION: Retired  LEISURE: Not currently exercising  HAND DOMINANCE : Right  PRIOR LEVEL OF FUNCTION: Independent  PATIENT GOALS: prevent chronic lymphedema after this fall   OBJECTIVE (FROM EVAL) Note: Objective measures were completed at Evaluation unless otherwise noted.  COGNITION:  Overall cognitive status: Within functional limits for tasks assessed   PALPATION: +2 pitting edema back of hand, forearm, below, to around the distal 1/3rd of the upper arm.   OBSERVATIONS / OTHER ASSESSMENTS: bruising all fingers and hand, elbow, and upper arm from surgery.  Skin tight and shiny from edema.     POSTURE: wearing sling upon arrival and leaving  UPPER EXTREMITY AROM/PROM: Just starting wrist and elbow AROM - did not measure objectively.  No shoulder ROM allowed.   05/30/23:Left UE AAROM elevation in supine=   LEFT UE AROM elevation (scaption) in supine: 123    LEFT ER AROM supine:27   LEFT IR AROM in standing: fingertips to medial glut fold and thumb reaches glut crease   LEFT UE FLEX in standing pt can briefly hold at 64 degrees when therapist assists with concentric contraction  04/04/2023 Left UE AAROM elevation in supine=   LEFT UE AROM elevation in supine: 100  04/11/2023   LEFT ER AROM supine:17   LEFT IR AROM supine:68 30 deg abd      IN SUPINE 04/30/2023 Left shoulder flex AROM in supine;  117 with elbow flexed to initiate   IR AROM  to stomach    ER  AROM 30 degrees at 30 degrees abduction     LYMPHEDEMA ASSESSMENTS:  LANDMARK RIGHT eval  At axilla 30.4  15cm proximal to olecranon 29.4  10 cm proximal to olecranon process 28.3  Olecranon process 24.7  15cm proximal to ulnar styloid 22.1  10 cm proximal to ulnar styloid process 18.7  Just proximal to ulnar styloid process 16.2  Across hand at thumb web space 19.8  At base of 2nd digit 6.4  (Blank rows = not tested)  LANDMARK  LEFT  eval LEFT 03/26/2023 LEFT 04/11/2023 LEFT  04/30/2023 LEFT 05/09/23 Left 05/23/23  At axilla 33 31 29.6 29.7 29.7 30.3  15cm proximal to olecranon 32.5 29.4 28.8 28.4 27.8 27.8  10 cm proximal to olecranon process 34.5 30.2 28.0 27.6 27.4 27.4  Olecranon process 30.5 26.6 24.0 23.4 23.3 22.9  15cm proximal to ulnar styloid  28.7 25.6 23.0 23.0 23.3 22.9  10 cm proximal to ulnar styloid process 26.5 23.4 20.5 21.1 21.9 22  Just proximal to ulnar styloid process 18.5 17.2 15.9 15.8 15.9 15.7  Across hand at thumb web space 20.4 18.8 19.2 19.0 18.4 18.8  At base of 2nd digit 7.0 6.1 5.85 6.0 5.9 5.9  (Blank rows = not tested)  Volume (at eval):          Length 7cm Rt:                         Lt:                      04/30/2023  1596 ml difference   PATIENT SURVEYS:  Neldon Mc  68% limited EVAL 04/29/2022     34%   TODAY'S TREATMENT:                                                                                                                                         DATE:  07/09/23: Therapeutic Exercises UE Ranger into flexion x 2 mins, then into scaption x 1 min Finger Ladder into flex x 3 up to #32 Weights for education for pt for gym use as follows: Lat Pull Downs 25# x 15 Tricep extensions 15# x 15 Rows (in standing as pts knee ROM wouldn't allow her to sit on stool) 10# x 12 with pt returning therapist demo for each and educated her about benefit of controlling eccentric contractions Manual Therapy P/ROM to Lt shoulder into flex, scaption and er/IR all to tolerance with scapular depression by therapist STM to Lt pect insertion, then into Rt S/L with cocoa butter to Lt medial scapular border and superior to spine of scapula where pt reports this is area she gets most cramps with certain movements.    07/05/23: Therapeutic Exercises Pulleys into flex and abd x 2 mins each Assessed how pt is doing certain Lt UE exs at the gym. Advised her to try bicep  curls against wall.  UE Ranger and added 1# to wrist for Lt UE flex x 2 mins Finger Ladder flex x 5 reps up to #31 Lt pectoralis stretch x 2, 10-15 sec holds Manual Therapy P/ROM to Lt shoulder into flex, scaption and er/IR all to tolerance with scapular depression by therapist STM over TSR incision and to Lt pect insertion and left lateral arm with cocoa butter  07/02/2023 Therapeutic Exercises UE Ranger 2x10 flexion and scaption Finger ladder x 5, to 31 Half foam roll flexion x 10, horizontal abd x 10 stopping before table so she could raise up her in  horizontal abd, snow angles x 8 Neuromuscular Re-ed Small blue un weighted ball on wall for up/down x 20 sec and then side/side x 12 sec but pt required 3 brief rests during due to Lt UE fatigue Lt UE wall slide into flexion then into controlled eccentric abd x 5, very challenging for pt due to fatigue Red overdor TB, x 10 for left shoulder flexion assist Manual Therapy P/ROM to Lt shoulder into flex, scaption and er/IR all to tolerance with scapular depression by therapist STM over TSR incision and to Lt pect insertion and left lateral arm  06/28/2023 Therapeutic Exercises UE Ranger x 3 mins into flex, then scaption Finger Ladder to #32 (which is as high as pt could reach due to height so full A/ROM) x 5 Half foam roll flexion x 8, horizontal abd x 8 stopping before table so she could raise up her in  horizontal abd, snow angels x 8 Neuromuscular Small gn weighted ball on wall for up/down x 30 sec and then side/side  x 12 sec but pt required 3 brief rests during due to Lt UE fatigue Lt UE wall slide into flexion then into controlled eccentric abd x 5, very challenging for pt due to fatigue Manual Therapy P/ROM to Lt shoulder into flex, scaption and er/IR all to tolerance with scapular depression by therapist STM over TSR incision and to Lt pect insertion     PATIENT EDUCATION:  Access Code: [email or web address] URL:  [email or web address] Date: 04/13/2023 Prepared by: Alvira Monday  Exercises - Standing Isometric Shoulder Internal Rotation at Doorway  - 1 x daily - 7 x weekly - 3 sets - 10 reps - Standing Isometric Shoulder External Rotation with Doorway  - 1 x daily - 7 x weekly - 1 sets - 5-10 reps - Isometric Shoulder Flexion at Wall  - 1 x daily - 7 x weekly - 1 sets - 5-10 reps - 5 hold - Isometric Shoulder Extension at Wall  - 1 x daily - 7 x weekly - 1 sets - 5-10 reps - Isometric Shoulder Abduction at Wall  - 1 x daily - 7 x weekly - 1 sets - 5-10 reps   05/02/23: Education details: Tricep ext with band over top of door Person educated: Patient Education method: Explanation, Demonstration, and Verbal cues, and tactile cues Education comprehension: verbalized understanding, returned demonstration, tactile and VC's   HOME EXERCISE PROGRAM: Scapular retraction, shoulder extension yellow band x 10 Supine alphabet Isometrics for Lt shoulder Standing dowel exercises Self MLD Forearms on wall for scapular strength with yellow theraband Lt tricep ext with yellow theraband over door  ASSESSMENT:  CLINICAL IMPRESSION: Pt conts to report good improvements with A/ROM. She does report noticing more weakness with higher reaching as she hasn't been able to reach this high before. So today showed her gym exercises she could add to work on UE strength when at the [facility]. Then continued with manual therapy working to improve her Lt UE A/ROM and decrease muscle tightness. Renewal required next if pt is to cont at this time.   OBJECTIVE IMPAIRMENTS: decreased activity tolerance, decreased knowledge of condition, decreased knowledge of use of DME, decreased mobility, decreased ROM, and increased edema.   ACTIVITY LIMITATIONS: carrying, lifting, sleeping, bathing, dressing, and reach over head  PARTICIPATION LIMITATIONS: meal prep, cleaning, laundry, shopping, and community activity  PERSONAL  FACTORS: Age and 1 comorbidity: ALND and radiation hx  are also affecting patient's functional outcome.   REHAB POTENTIAL: Excellent  CLINICAL DECISION MAKING: Evolving/moderate complexity  EVALUATION COMPLEXITY: Moderate  GOALS: Goals reviewed with patient? Yes  SHORT TERM GOALS: Target date: 04/08/23    Pt will be ind with self MLD for edema reduction Baseline: Goal status: In progress 2.  Pt will decrease volume to or less to demonstrate improving edema status Baseline:  Goal status: MET 1596  (8 ml more) 04/30/2023 3.  Pt will be evaluated and treated for shoulder with goals added as needed as instructed by MD during future visits.  Baseline:  Goal status:MET 04/04/2023  LONG TERM GOALS: Target date: 05/10/23  Pt will reduce to less than volume difference between sides to demonstrate no active lymphedema Baseline:  Goal status: MET 04/29/2022 (1596 ml) 2.  Pt will obtain compression as needed for discharge depending on reduction Baseline:  Goal status: MET 04/11/2023   3. Pt will be able to perform AROM  elevation in supine to atleast 120 degrees without compensation  Goal Status; MET: 117 04/30/2023; 123 degrees - 05/30/23  4.  Pt will be able to place 8 oz object on eye level shelf without UT compensation  GOALStatus: In progress; 05/30/23 - pt able to lift 1# half way to eye level shelf before compensation  5. Pt will be tolerant of resisted exs for improving UE strength at 7 weeks post op Goal Status; PARTIALLY MET - 05/30/23 - pt is independent with current HEP of A/AA/ROM and isometric strength, has started progressing to theraband  6. Pt will be able to return to driving for greater independence Goal status: PROGRESSING - 05/30/23 - pt is able to drive close to home (grocery store and pharmacy) but has not been able to return to highway driving that will require quick turns and maneuvering she is not able to do yet  7. Pt will be able to achieve 80-90 degrees Lt  shoulder flexion for increased ease with ADLs and washing hair.  Baseline: 05/30/23 - 64 degrees   Goal Status: NEW  PLAN:  PT FREQUENCY: 2-3x/week  PT DURATION:  4 weeks  PLANNED INTERVENTIONS: 97164- PT Re-evaluation, 97110-Therapeutic exercises, 97530- Therapeutic activity, 97112- Neuromuscular re-education, 97535- Self Care, and 16109- Manual therapy  PLAN FOR NEXT SESSION:  Renewal if pt is to cont next. See if she has any more gym equipment questions. Review weighted ball on wall and forearms on wall;  progress Lt UE and scapula strength, review and cont MLD prn; Pt needs new script for flat knit but should wait to be measured until fibrosis further decreases.  06/25/23: Faxed script for garments to Dr. Hiram Comber, PTA 07/09/23 12:08 PM    05/14/23: Start with forearms on wall holding short yellow theraband in hands:  Pull down keeping elbow bent and squeezing shoulder blade towards spine at end of pull  2 sets of 5 each  2.   Inch forearms up wall keeping elbows in only as high as you can control by keeping the shoulder blade down  2 sets of 5 each  Next:  Throw long band over top of door, close door and pull down with Lt arm, when returning to start position keep shoulder down  Cancer Rehab [phone]

## 2023-07-10 DIAGNOSIS — M25551 Pain in right hip: Secondary | ICD-10-CM | POA: Diagnosis not present

## 2023-07-12 ENCOUNTER — Ambulatory Visit: Payer: Medicare PPO

## 2023-07-12 DIAGNOSIS — Z4789 Encounter for other orthopedic aftercare: Secondary | ICD-10-CM | POA: Diagnosis not present

## 2023-07-12 DIAGNOSIS — Z96612 Presence of left artificial shoulder joint: Secondary | ICD-10-CM | POA: Diagnosis not present

## 2023-07-12 DIAGNOSIS — M25612 Stiffness of left shoulder, not elsewhere classified: Secondary | ICD-10-CM

## 2023-07-12 DIAGNOSIS — I972 Postmastectomy lymphedema syndrome: Secondary | ICD-10-CM

## 2023-07-12 NOTE — Therapy (Signed)
 OUTPATIENT PT UPPER EXTREMITY LYMPHEDEMA TREATMENT  Patient Name: Sarah Underwood MRN: [id number] DOB:1945/09/26, 78 y.o., female Today's Date: 07/12/2023  END OF SESSION:  PT End of Session - 07/12/23 1104     Visit Number 35    Number of Visits 39    Date for PT Re-Evaluation 08/09/23    Authorization Type [employer] auth done (07/12/23)    Authorization - Visit Number 9    Authorization - Number of Visits 12    Progress Note Due on Visit 39    PT Start Time 1104    PT Stop Time 1202    PT Time Calculation (min) 58 min    Activity Tolerance Patient tolerated treatment well    Behavior During Therapy WFL for tasks assessed/performed             Past Medical History:  Diagnosis Date   Basal cell carcinoma 1994   right side of node   Benign positional vertigo    with rolling over    Celiac disease    Dysrhythmia    Generalized anxiety disorder    GERD (gastroesophageal reflux disease)    Graves disease 2007   thyroid nodule   Hard of hearing    Heart murmur    History of breast cancer 1995   left - treated with mastectomy   History of radiation therapy 2001   37 sessions following recurrence of breast cancer in left chest wall   Hypertension    Knee pain, bilateral 12/14/2014   Major depressive disorder in remission    Mild persistent asthma, uncomplicated 06/29/2015   Nocturia    OA (osteoarthritis) of hip 09/08/2015   Ovarian mass    small solid nodule 8x66mm: Right ovary - stable since 2000, follow u/s do not show this.   Renal mass 2021   followed by Dr. Berneice Heinrich, [facility].  Having every 6 month follow up   Seasonal allergies    Stress fracture of metatarsal bone of left foot 12/15/2019   Vegetarian diet    Past Surgical History:  Procedure Laterality Date   APPENDECTOMY     basal cell removal  7/94   right nose   BREAST BIOPSY Right 1987   benign fibroadenoma   breast cancer recurrence Left 01/2000   at site of left chest wall after previous mastectomy  with removal and treatment with radiaton.   CARPAL TUNNEL RELEASE Right 05/08/2013   Procedure: RIGHT CARPAL TUNNEL RELEASE;  Surgeon: Wyn Forster., MD;  Location: [facility];  Service: Orthopedics;  Laterality: Right;   CATARACT EXTRACTION     03/20/14 left eye, 12/15 right eye   COLPOSCOPY W/ BIOPSY / CURETTAGE  03/12/12   benign, but + HR HPV   EYE SURGERY     bilat cataract surgery    MASTECTOMY Left 3/95   Stage IIb 0/14 LN ER/ PR + with mastectomy - Dr. Mardella Layman   MOHS SURGERY     right nare   OVARY SURGERY  1974   lt   REVERSE SHOULDER ARTHROPLASTY Left 02/26/2023   Procedure: REVERSE SHOULDER ARTHROPLASTY;  Surgeon: Yolonda Kida, MD;  Location: [facility];  Service: Orthopedics;  Laterality: Left;   TONSILLECTOMY AND ADENOIDECTOMY  1950   TOTAL HIP ARTHROPLASTY Right 09/08/2015   Procedure: TOTAL HIP ARTHROPLASTY ANTERIOR APPROACH;  Surgeon: Ollen Gross, MD;  Location: [facility];  Service: Orthopedics;  Laterality: Right;   Patient Active Problem List   Diagnosis Date Noted  Shoulder fracture 02/23/2023   Closed fracture of proximal end of left humerus, unspecified fracture morphology, initial encounter 02/22/2023   Thyroid nodule 02/22/2023   Major depressive disorder in remission 03/29/2020   Generalized anxiety disorder    Benign positional vertigo    Hypertension    Heart murmur    Stress fracture of metatarsal bone of left foot 12/15/2019   History of revision of total replacement of right hip joint 11/26/2017   Leg edema, left 09/26/2016   Post-nasal drainage 03/30/2016   OA (osteoarthritis) of hip 09/08/2015   Mild persistent asthma, uncomplicated 06/29/2015   Hip pain, bilateral 12/14/2014   Knee pain, bilateral 12/14/2014   Postmenopausal atrophic vaginitis 02/23/2013   Graves disease 2007   History of radiation therapy 2001   History of breast cancer 1995   Basal cell carcinoma 1994    PCP: Alysia Penna, MD  REFERRING PROVIDER:  Duwayne Heck, MD  REFERRING DIAG:  Diagnosis  C50.511,Z17.0 (ICD-10-CM) - Malignant neoplasm of lower-outer quadrant of right breast of female, estrogen receptor positive (HCC)  I89.0 (ICD-10-CM) - Lymphedema  Z47.89 orthopedic aftercare  S/P reverse total shoulder arthroplasty, left  Encounter for other orthopedic aftercare  Stiffness of left shoulder, not elsewhere classified  THERAPY DIAG:  Postmastectomy lymphedema  S/P reverse total shoulder arthroplasty, left  Encounter for other orthopedic aftercare  Stiffness of left shoulder, not elsewhere classified  ONSET DATE: 02/26/23  Rationale for Evaluation and Treatment: Rehabilitation  SUBJECTIVE                                                                                                                                                                                           SUBJECTIVE STATEMENT:   I tried vacuuming and that didn't go well, but that is my back and hip too.  My arm is doing great, but my shoulder still compensates. I have been using the pulleys more. I started working on my grip strength with a series of balls. I haven't fully integrated to the gym yet. It would be helpful to come 1x/  week for a month so I can continue to work at the gym.My arm has not been swelling much but Monday I had therapy and then went to the gym and did 15 min on the UBE and 4 sets of 10 reps on the rowing machine. I got swollen behind my arm and in the forearm some.  I put the TG soft on all day and the next day it had reduced. I still have trouble washing my hair, reaching into low cabinets, my right had has a flexing weakness, I still have great  difficulty putting a fitted sheet on my bed. Household chores like cleaning tub tiles, cleaning the floor. I can't open my windows, but I can close them. I still can't lift a heavy pot of water and pour it into the sink.  PERTINENT HISTORY: Recent fall with left arm fracture.  Surgery with  Reverse TSR 02/26/23.  This is the same side as her mastectomy which was diagnosed in 1995 with chest wall recurrence in 2001.  No remaining lymph nodes.   Shoulder surgery: Dr. Duwayne Heck -See media section for post-op approved exercises and okay for compression therapy.    PAIN:  PAIN:  Are you having pain? No, I've adjusted to the cane   PRECAUTIONS: cleared for all ROM/activity of the shoulder per MD  RED FLAGS: None   WEIGHT BEARING RESTRICTIONS: No  FALLS:  Has patient fallen in last 6 months? Yes. Number of falls 1 - does not remember details of her fall. Just remembers she was going outside.   LIVING ENVIRONMENT: Lives with: lives alone Has a CNA for assistance at home.   OCCUPATION: Retired  LEISURE: Not currently exercising  HAND DOMINANCE : Right  PRIOR LEVEL OF FUNCTION: Independent  PATIENT GOALS: prevent chronic lymphedema after this fall   OBJECTIVE (FROM EVAL) Note: Objective measures were completed at Evaluation unless otherwise noted.  COGNITION:  Overall cognitive status: Within functional limits for tasks assessed   PALPATION: +2 pitting edema back of hand, forearm, below, to around the distal 1/3rd of the upper arm.   OBSERVATIONS / OTHER ASSESSMENTS: bruising all fingers and hand, elbow, and upper arm from surgery.  Skin tight and shiny from edema.     POSTURE: wearing sling upon arrival and leaving  UPPER EXTREMITY AROM/PROM: Just starting wrist and elbow AROM - did not measure objectively.  No shoulder ROM allowed.  07/12/2023 Left UE flexion standing; 69 with compensation Left UE abd standing; 48 Left UE elevation supine ;142 Left UE ER supine;  54 LEFT IR standing; L5 sliding hand up back  05/30/23:Left UE AAROM elevation in supine=   LEFT UE AROM elevation (scaption) in supine: 123    LEFT ER AROM supine:27   LEFT IR AROM in standing: fingertips to medial glut fold and thumb reaches glut crease   LEFT UE FLEX in standing pt can  briefly hold at 64 degrees when therapist assists with concentric contraction  04/04/2023 Left UE AAROM elevation in supine=   LEFT UE AROM elevation in supine: 100  04/11/2023   LEFT ER AROM supine:17   LEFT IR AROM supine:68 30 deg abd      IN SUPINE 04/30/2023 Left shoulder flex AROM in supine;  117 with elbow flexed to initiate   IR AROM  to stomach    ER  AROM 30 degrees at 30 degrees abduction     LYMPHEDEMA ASSESSMENTS:  LANDMARK RIGHT eval  At axilla 30.4  15cm proximal to olecranon 29.4  10 cm proximal to olecranon process 28.3  Olecranon process 24.7  15cm proximal to ulnar styloid 22.1  10 cm proximal to ulnar styloid process 18.7  Just proximal to ulnar styloid process 16.2  Across hand at thumb web space 19.8  At base of 2nd digit 6.4  (Blank rows = not tested)  LANDMARK LEFT  eval LEFT 03/26/2023 LEFT 04/11/2023 LEFT  04/30/2023 LEFT 05/09/23 Left 05/23/23  At axilla 33 31 29.6 29.7 29.7 30.3  15cm proximal to olecranon 32.5 29.4 28.8 28.4 27.8 27.8  10  cm proximal to olecranon process 34.5 30.2 28.0 27.6 27.4 27.4  Olecranon process 30.5 26.6 24.0 23.4 23.3 22.9  15cm proximal to ulnar styloid  28.7 25.6 23.0 23.0 23.3 22.9  10 cm proximal to ulnar styloid process 26.5 23.4 20.5 21.1 21.9 22  Just proximal to ulnar styloid process 18.5 17.2 15.9 15.8 15.9 15.7  Across hand at thumb web space 20.4 18.8 19.2 19.0 18.4 18.8  At base of 2nd digit 7.0 6.1 5.85 6.0 5.9 5.9  (Blank rows = not tested)  Volume (at eval):          Length 7cm Rt:                         Lt:                      04/30/2023  1596 ml difference   PATIENT SURVEYS:  Quick Dash 68% limited EVAL 04/29/2022     34% 07/12/2023    20.45%   TODAY'S TREATMENT:                                                                                                                                         DATE:   07/12/2023 Discussed progress toward goals, improvements and  deficits Pulleys 2 min flex and abduction to warm up  Shoulder ranger x 10 flex, 5 abduction PROM left shoulder flexion, abd, ER Measured AROM left shoulder standing and supine Discussed importance of checking her arms tolerance for exercise before increasing reps too much and also wearing compression sleeve when at the gym 07/09/23: Therapeutic Exercises UE Ranger into flexion x 2 mins, then into scaption x 1 min Finger Ladder into flex x 3 up to #32 Weights for education for pt for gym use as follows: Lat Pull Downs 25# x 15 Tricep extensions 15# x 15 Rows (in standing as pts knee ROM wouldn't allow her to sit on stool) 10# x 12 with pt returning therapist demo for each and educated her about benefit of controlling eccentric contractions Manual Therapy P/ROM to Lt shoulder into flex, scaption and er/IR all to tolerance with scapular depression by therapist STM to Lt pect insertion, then into Rt S/L with cocoa butter to Lt medial scapular border and superior to spine of scapula where pt reports this is area she gets most cramps with certain movements.    07/05/23: Therapeutic Exercises Pulleys into flex and abd x 2 mins each Assessed how pt is doing certain Lt UE exs at the gym. Advised her to try bicep curls against wall.  UE Ranger and added 1# to wrist for Lt UE flex x 2 mins Finger Ladder flex x 5 reps up to #31 Lt pectoralis stretch x 2, 10-15 sec holds Manual Therapy P/ROM to Lt shoulder into flex, scaption and er/IR all  to tolerance with scapular depression by therapist STM over TSR incision and to Lt pect insertion and left lateral arm with cocoa butter  07/02/2023 Therapeutic Exercises UE Ranger 2x10 flexion and scaption Finger ladder x 5, to 31 Half foam roll flexion x 10, horizontal abd x 10 stopping before table so she could raise up her in  horizontal abd, snow angles x 8 Neuromuscular Re-ed Small blue un weighted ball on wall for up/down x 20 sec and then side/side x  12 sec but pt required 3 brief rests during due to Lt UE fatigue Lt UE wall slide into flexion then into controlled eccentric abd x 5, very challenging for pt due to fatigue Red overdor TB, x 10 for left shoulder flexion assist Manual Therapy P/ROM to Lt shoulder into flex, scaption and er/IR all to tolerance with scapular depression by therapist STM over TSR incision and to Lt pect insertion and left lateral arm  06/28/2023 Therapeutic Exercises UE Ranger x 3 mins into flex, then scaption Finger Ladder to #32 (which is as high as pt could reach due to height so full A/ROM) x 5 Half foam roll flexion x 8, horizontal abd x 8 stopping before table so she could raise up her in  horizontal abd, snow angels x 8 Neuromuscular Small gn weighted ball on wall for up/down x 30 sec and then side/side x 12 sec but pt required 3 brief rests during due to Lt UE fatigue Lt UE wall slide into flexion then into controlled eccentric abd x 5, very challenging for pt due to fatigue Manual Therapy P/ROM to Lt shoulder into flex, scaption and er/IR all to tolerance with scapular depression by therapist STM over TSR incision and to Lt pect insertion     PATIENT EDUCATION:  Access Code: [email or web address] URL: [email or web address] Date: 04/13/2023 Prepared by: Alvira Monday  Exercises - Standing Isometric Shoulder Internal Rotation at Doorway  - 1 x daily - 7 x weekly - 3 sets - 10 reps - Standing Isometric Shoulder External Rotation with Doorway  - 1 x daily - 7 x weekly - 1 sets - 5-10 reps - Isometric Shoulder Flexion at Wall  - 1 x daily - 7 x weekly - 1 sets - 5-10 reps - 5 hold - Isometric Shoulder Extension at Wall  - 1 x daily - 7 x weekly - 1 sets - 5-10 reps - Isometric Shoulder Abduction at Wall  - 1 x daily - 7 x weekly - 1 sets - 5-10 reps   05/02/23: Education details: Tricep ext with band over top of door Person educated: Patient Education method: Explanation, Demonstration, and Verbal  cues, and tactile cues Education comprehension: verbalized understanding, returned demonstration, tactile and VC's   HOME EXERCISE PROGRAM: Scapular retraction, shoulder extension yellow band x 10 Supine alphabet Isometrics for Lt shoulder Standing dowel exercises Self MLD Forearms on wall for scapular strength with yellow theraband Lt tricep ext with yellow theraband over door  ASSESSMENT:  CLINICAL IMPRESSION: Pt conts to report good improvements with A/ROM but continues to have difficulty with compensation for reaching activities. She has achieved all STG's and has been independent with self management for her lymphedema. She is progressing with her LTG's.She has been able to resume driving, but does have occasional difficulty if having to turn the wheel quickly or multiple times. Her AROM has improved nicely in supine, but she has continued compensation while working against gravity although this has also improved. She is very motivated to improve  and compliant and she has recently started to go to the gym. She has questions about appropriate equipment to use. We would like to work with her for 1 visit per week over a month to enable her to fully transition to gym activities.  This is reasonable due to the nature of her injuries and surgery. I anticipate with the right training she will improve and become more functional over the next 6 months or more by working out properly at the gym. OBJECTIVE IMPAIRMENTS: decreased activity tolerance, decreased knowledge of condition, decreased knowledge of use of DME, decreased mobility, decreased ROM, and increased edema.   ACTIVITY LIMITATIONS: carrying, lifting, sleeping, bathing, dressing, and reach over head  PARTICIPATION LIMITATIONS: meal prep, cleaning, laundry, shopping, and community activity  PERSONAL FACTORS: Age and 1 comorbidity: ALND and radiation hx  are also affecting patient's functional outcome.   REHAB POTENTIAL:  Excellent  CLINICAL DECISION MAKING: Evolving/moderate complexity  EVALUATION COMPLEXITY: Moderate  GOALS: Goals reviewed with patient? Yes  SHORT TERM GOALS: Target date: 04/08/23    Pt will be ind with self MLD for edema reduction Baseline: Goal status: MET 2.  Pt will decrease volume to or less to demonstrate improving edema status Baseline:  Goal status: MET 1596  (8 ml more) 04/30/2023 3.  Pt will be evaluated and treated for shoulder with goals added as needed as instructed by MD during future visits.  Baseline:  Goal status:MET 04/04/2023  LONG TERM GOALS: Target date: 05/10/23  Pt will reduce to less than volume difference between sides to demonstrate no active lymphedema Baseline:  Goal status: MET 04/29/2022 (1596 ml) 2.  Pt will obtain compression as needed for discharge depending on reduction Baseline:  Goal status: MET 04/11/2023   3. Pt will be able to perform AROM  elevation in supine to atleast 120 degrees without compensation  Goal Status; MET: 117 04/30/2023; 123 degrees - 05/30/23, 07/12/2023 142 degrees  4. Pt will be able to place 8 oz object on eye level shelf without UT compensation  GOALStatus: In progress; 07/12/23 - pt able to lift 1/2 to  3/4 of the way to eye level shelf before compensation  5. Pt will be tolerant of resisted exs for improving UE strength at 7 weeks post op Goal Status; PARTIALLY MET -working on HEP and trying to transition to the gym  6. Pt will be able to return to driving for greater independence Goal status: MET, has resumed local and highway driving, with occasional difficulty  7. Pt will be able to achieve 80-90 degrees Lt shoulder flexion for increased ease with ADLs and washing hair.  Baseline: 07/12/2023 - 69 degrees   Goal Status:In Progress  8.  Pt will be educated on appropriate gym equipment and use of free wts to continue strengthening independently  Goal status: New   PLAN:  PT FREQUENCY: 1x week  PT  DURATION:  4 weeks  PLANNED INTERVENTIONS: 97164- PT Re-evaluation, 97110-Therapeutic exercises, 97530- Therapeutic activity, [id number]- Neuromuscular re-education, 97535- Self Care, and 16109- Manual therapy  PLAN FOR NEXT SESSION: pt has  more gym equipment questions. Review weighted ball on wall and forearms on wall;  progress Lt UE and scapula strength, review and cont MLD prn; Pt needs new script for flat knit but should wait to be measured until fibrosis further decreases.  06/25/23: Faxed script for garments to Dr. Merceda Elks, PT 07/12/23 1:50 PM    05/14/23: Start with forearms on wall holding short yellow  theraband in hands:  Pull down keeping elbow bent and squeezing shoulder blade towards spine at end of pull  2 sets of 5 each  2.   Inch forearms up wall keeping elbows in only as high as you can control by keeping the shoulder blade down  2 sets of 5 each  Next:  Throw long band over top of door, close door and pull down with Lt arm, when returning to start position keep shoulder down  Cancer Re[facility] [phone]

## 2023-07-19 ENCOUNTER — Ambulatory Visit: Payer: Self-pay

## 2023-07-19 DIAGNOSIS — Z4789 Encounter for other orthopedic aftercare: Secondary | ICD-10-CM | POA: Diagnosis not present

## 2023-07-19 DIAGNOSIS — M25612 Stiffness of left shoulder, not elsewhere classified: Secondary | ICD-10-CM | POA: Diagnosis not present

## 2023-07-19 DIAGNOSIS — I972 Postmastectomy lymphedema syndrome: Secondary | ICD-10-CM | POA: Diagnosis not present

## 2023-07-19 DIAGNOSIS — Z96612 Presence of left artificial shoulder joint: Secondary | ICD-10-CM

## 2023-07-19 NOTE — Therapy (Signed)
 OUTPATIENT PT UPPER EXTREMITY LYMPHEDEMA TREATMENT  Patient Name: Sarah Underwood MRN: [id number] DOB:[birth year], 78 y.o., female Today's Date: 07/19/2023  END OF SESSION:  PT End of Session - 07/19/23 1110     Visit Number 36    Number of Visits 39    Date for PT Re-Evaluation 08/09/23    Authorization Type [employer] auth done (07/12/23)    Authorization Time Period new auth sent on 06/02/23; 12 visits approved 06/04/2023-07/12/2023; 4 visits approved 3/21 - 08/09/23    Authorization - Visit Number 1    Authorization - Number of Visits 4    Progress Note Due on Visit 39    PT Start Time 1107    PT Stop Time 1202    PT Time Calculation (min) 55 min    Activity Tolerance Patient tolerated treatment well    Behavior During Therapy [facility] for tasks assessed/performed             Past Medical History:  Diagnosis Date   Basal cell carcinoma 1994   right side of node   Benign positional vertigo    with rolling over    Celiac disease    Dysrhythmia    Generalized anxiety disorder    GERD (gastroesophageal reflux disease)    Graves disease 2007   thyroid nodule   Hard of hearing    Heart murmur    History of breast cancer 1995   left - treated with mastectomy   History of radiation therapy 2001   37 sessions following recurrence of breast cancer in left chest wall   Hypertension    Knee pain, bilateral 12/14/2014   Major depressive disorder in remission    Mild persistent asthma, uncomplicated 06/29/2015   Nocturia    OA (osteoarthritis) of hip 09/08/2015   Ovarian mass    small solid nodule 8x82mm: Right ovary - stable since 2000, follow u/s do not show this.   Renal mass 2021   followed by Dr. Berneice Heinrich, [facility].  Having every 6 month follow up   Seasonal allergies    Stress fracture of metatarsal bone of left foot 12/15/2019   Vegetarian diet    Past Surgical History:  Procedure Laterality Date   APPENDECTOMY     basal cell removal  7/94   right nose   BREAST BIOPSY Right  1987   benign fibroadenoma   breast cancer recurrence Left 01/2000   at site of left chest wall after previous mastectomy with removal and treatment with radiaton.   CARPAL TUNNEL RELEASE Right 05/08/2013   Procedure: RIGHT CARPAL TUNNEL RELEASE;  Surgeon: Wyn Forster., MD;  Location: [facility];  Service: Orthopedics;  Laterality: Right;   CATARACT EXTRACTION     03/20/14 left eye, 12/15 right eye   COLPOSCOPY W/ BIOPSY / CURETTAGE  03/12/12   benign, but + HR HPV   EYE SURGERY     bilat cataract surgery    MASTECTOMY Left 3/95   Stage IIb 0/14 LN ER/ PR + with mastectomy - Dr. Mardella Layman   MOHS SURGERY     right nare   OVARY SURGERY  1974   lt   REVERSE SHOULDER ARTHROPLASTY Left 02/26/2023   Procedure: REVERSE SHOULDER ARTHROPLASTY;  Surgeon: Yolonda Kida, MD;  Location: [facility] OR;  Service: Orthopedics;  Laterality: Left;   TONSILLECTOMY AND ADENOIDECTOMY  1950   TOTAL HIP ARTHROPLASTY Right 09/08/2015   Procedure: TOTAL HIP ARTHROPLASTY ANTERIOR APPROACH;  Surgeon: Ollen Gross, MD;  Location: [facility];  Service: Orthopedics;  Laterality: Right;   Patient Active Problem List   Diagnosis Date Noted   Shoulder fracture 02/23/2023   Closed fracture of proximal end of left humerus, unspecified fracture morphology, initial encounter 02/22/2023   Thyroid nodule 02/22/2023   Major depressive disorder in remission 03/29/2020   Generalized anxiety disorder    Benign positional vertigo    Hypertension    Heart murmur    Stress fracture of metatarsal bone of left foot 12/15/2019   History of revision of total replacement of right hip joint 11/26/2017   Leg edema, left 09/26/2016   Post-nasal drainage 03/30/2016   OA (osteoarthritis) of hip 09/08/2015   Mild persistent asthma, uncomplicated 06/29/2015   Hip pain, bilateral 12/14/2014   Knee pain, bilateral 12/14/2014   Postmenopausal atrophic vaginitis 02/23/2013   Graves disease 2007   History of radiation  therapy 2001   History of breast cancer 1995   Basal cell carcinoma 1994    PCP: Alysia Penna, MD  REFERRING PROVIDER: Duwayne Heck, MD  REFERRING DIAG:  Diagnosis  C50.511,Z17.0 (ICD-10-CM) - Malignant neoplasm of lower-outer quadrant of right breast of female, estrogen receptor positive (HCC)  I89.0 (ICD-10-CM) - Lymphedema  Z47.89 orthopedic aftercare  S/P reverse total shoulder arthroplasty, left  Encounter for other orthopedic aftercare  Stiffness of left shoulder, not elsewhere classified  THERAPY DIAG:  Postmastectomy lymphedema  S/P reverse total shoulder arthroplasty, left  Encounter for other orthopedic aftercare  Stiffness of left shoulder, not elsewhere classified  ONSET DATE: 02/26/23  Rationale for Evaluation and Treatment: Rehabilitation  SUBJECTIVE                                                                                                                                                                                           SUBJECTIVE STATEMENT:  I went to urgent care over the weekend from my asthma due to the allergies. The PA there called in a script for my inhaler and told me to see my PCP on Monday. He renewed some of my meds that had run out because I hadn't needed them. So I haven't been at the gym this week to avoid germs and the doctor told me not to overexert myself this week either until the wheezing was much improved.   PERTINENT HISTORY: Recent fall with left arm fracture.  Surgery with Reverse TSR 02/26/23.  This is the same side as her mastectomy which was diagnosed in 1995 with chest wall recurrence in 2001.  No remaining lymph nodes.   Shoulder surgery: Dr. Duwayne Heck -See media section for post-op approved exercises and okay for  compression therapy.    PAIN:  PAIN:  Are you having pain? No, I've adjusted to the cane   PRECAUTIONS: cleared for all ROM/activity of the shoulder per MD  RED FLAGS: None   WEIGHT BEARING  RESTRICTIONS: No  FALLS:  Has patient fallen in last 6 months? Yes. Number of falls 1 - does not remember details of her fall. Just remembers she was going outside.   LIVING ENVIRONMENT: Lives with: lives alone Has a CNA for assistance at home.   OCCUPATION: Retired  LEISURE: Not currently exercising  HAND DOMINANCE : Right  PRIOR LEVEL OF FUNCTION: Independent  PATIENT GOALS: prevent chronic lymphedema after this fall   OBJECTIVE (FROM EVAL) Note: Objective measures were completed at Evaluation unless otherwise noted.  COGNITION:  Overall cognitive status: Within functional limits for tasks assessed   PALPATION: +2 pitting edema back of hand, forearm, below, to around the distal 1/3rd of the upper arm.   OBSERVATIONS / OTHER ASSESSMENTS: bruising all fingers and hand, elbow, and upper arm from surgery.  Skin tight and shiny from edema.     POSTURE: wearing sling upon arrival and leaving  UPPER EXTREMITY AROM/PROM: Just starting wrist and elbow AROM - did not measure objectively.  No shoulder ROM allowed.  07/12/2023 Left UE flexion standing; 69 with compensation Left UE abd standing; 48 Left UE elevation supine ;142 Left UE ER supine;  54 LEFT IR standing; L5 sliding hand up back  05/30/23:Left UE AAROM elevation in supine=   LEFT UE AROM elevation (scaption) in supine: 123    LEFT ER AROM supine:27   LEFT IR AROM in standing: fingertips to medial glut fold and thumb reaches glut crease   LEFT UE FLEX in standing pt can briefly hold at 64 degrees when therapist assists with concentric contraction  04/04/2023 Left UE AAROM elevation in supine=   LEFT UE AROM elevation in supine: 100  04/11/2023   LEFT ER AROM supine:17   LEFT IR AROM supine:68 30 deg abd      IN SUPINE 04/30/2023 Left shoulder flex AROM in supine;  117 with elbow flexed to initiate   IR AROM  to stomach    ER  AROM 30 degrees at 30 degrees abduction     LYMPHEDEMA ASSESSMENTS:  LANDMARK  RIGHT eval  At axilla 30.4  15cm proximal to olecranon 29.4  10 cm proximal to olecranon process 28.3  Olecranon process 24.7  15cm proximal to ulnar styloid 22.1  10 cm proximal to ulnar styloid process 18.7  Just proximal to ulnar styloid process 16.2  Across hand at thumb web space 19.8  At base of 2nd digit 6.4  (Blank rows = not tested)  LANDMARK LEFT  eval LEFT 03/26/2023 LEFT 04/11/2023 LEFT  04/30/2023 LEFT 05/09/23 Left 05/23/23  At axilla 33 31 29.6 29.7 29.7 30.3  15cm proximal to olecranon 32.5 29.4 28.8 28.4 27.8 27.8  10 cm proximal to olecranon process 34.5 30.2 28.0 27.6 27.4 27.4  Olecranon process 30.5 26.6 24.0 23.4 23.3 22.9  15cm proximal to ulnar styloid  28.7 25.6 23.0 23.0 23.3 22.9  10 cm proximal to ulnar styloid process 26.5 23.4 20.5 21.1 21.9 22  Just proximal to ulnar styloid process 18.5 17.2 15.9 15.8 15.9 15.7  Across hand at thumb web space 20.4 18.8 19.2 19.0 18.4 18.8  At base of 2nd digit 7.0 6.1 5.85 6.0 5.9 5.9  (Blank rows = not tested)  Volume (at eval):  Length 7cm Rt:                         Lt:                      04/30/2023  1596 ml difference   PATIENT SURVEYS:  Quick Dash 68% limited EVAL 04/29/2022     34% 07/12/2023    20.45%   TODAY'S TREATMENT:                                                                                                                                         DATE:  07/19/23: Therapeutic Exercises Pulleys into flex and scaption x 3 mins to warm muscles up with VC's to hold stretch a few sec to allow for increased muscle length UE Ranger Lt shoulder flex x 2 mins, then scaption x 1 min, next Lt circles, then attempted Rt circles but required Lt hand to assist motion Therapeutic Activities Free Motion Machine:  Lat pull downs with pulleys 10# x 15 Tricep Extensions bil (unable to do Lt only due to weakness) 5# 2 x 12, VC's to control eccentric contractions, second set 10# Rows  with 10# in standing  Manual Therapy P/ROM with Lt shoulder into flex and scaption with scapular depression by therapist STM with cocoa butter to incision and ant/post shoulder, also to pectoralis insertion and lateral scap border  07/12/2023 Discussed progress toward goals, improvements and deficits Pulleys 2 min flex and abduction to warm up  Shoulder ranger x 10 flex, 5 abduction PROM left shoulder flexion, abd, ER Measured AROM left shoulder standing and supine Discussed importance of checking her arms tolerance for exercise before increasing reps too much and also wearing compression sleeve when at the gym  07/09/23: Therapeutic Exercises UE Ranger into flexion x 2 mins, then into scaption x 1 min Finger Ladder into flex x 3 up to #32 Weights for education for pt for gym use as follows: Lat Pull Downs 25# x 15 Tricep extensions 15# x 15 Rows (in standing as pts knee ROM wouldn't allow her to sit on stool) 10# x 12 with pt returning therapist demo for each and educated her about benefit of controlling eccentric contractions Manual Therapy P/ROM to Lt shoulder into flex, scaption and er/IR all to tolerance with scapular depression by therapist STM to Lt pect insertion, then into Rt S/L with cocoa butter to Lt medial scapular border and superior to spine of scapula where pt reports this is area she gets most cramps with certain movements.       PATIENT EDUCATION:  Access Code: [email or web address] URL: [email or web address] Date: 04/13/2023 Prepared by: Alvira Monday  Exercises - Standing Isometric Shoulder Internal Rotation at Doorway  - 1 x daily - 7 x weekly - 3 sets - 10 reps - Standing Isometric Shoulder External Rotation  with Doorway  - 1 x daily - 7 x weekly - 1 sets - 5-10 reps - Isometric Shoulder Flexion at Wall  - 1 x daily - 7 x weekly - 1 sets - 5-10 reps - 5 hold - Isometric Shoulder Extension at Wall  - 1 x daily - 7 x weekly - 1 sets - 5-10 reps - Isometric  Shoulder Abduction at Wall  - 1 x daily - 7 x weekly - 1 sets - 5-10 reps   05/02/23: Education details: Tricep ext with band over top of door Person educated: Patient Education method: Explanation, Demonstration, and Verbal cues, and tactile cues Education comprehension: verbalized understanding, returned demonstration, tactile and VC's   HOME EXERCISE PROGRAM: Scapular retraction, shoulder extension yellow band x 10 Supine alphabet Isometrics for Lt shoulder Standing dowel exercises Self MLD Forearms on wall for scapular strength with yellow theraband Lt tricep ext with yellow theraband over door  ASSESSMENT:  CLINICAL IMPRESSION: Pt comes in reporting she hasn't been to the gym this week as she had a flare up with her asthma due to allergies over the weekend. Her MD advised her not to overexert herself for the next few days while she was recovering and using breathing treatments. She reports feeling better today so resumed exercises. Her A/ROM was much improved and she reports feeling much improvement with stretches as well.    OBJECTIVE IMPAIRMENTS: decreased activity tolerance, decreased knowledge of condition, decreased knowledge of use of DME, decreased mobility, decreased ROM, and increased edema.   ACTIVITY LIMITATIONS: carrying, lifting, sleeping, bathing, dressing, and reach over head  PARTICIPATION LIMITATIONS: meal prep, cleaning, laundry, shopping, and community activity  PERSONAL FACTORS: Age and 1 comorbidity: ALND and radiation hx  are also affecting patient's functional outcome.   REHAB POTENTIAL: Excellent  CLINICAL DECISION MAKING: Evolving/moderate complexity  EVALUATION COMPLEXITY: Moderate  GOALS: Goals reviewed with patient? Yes  SHORT TERM GOALS: Target date: 04/08/23    Pt will be ind with self MLD for edema reduction Baseline: Goal status: MET 2.  Pt will decrease volume to or less to demonstrate improving edema status Baseline:  Goal  status: MET 1596  (8 ml more) 04/30/2023 3.  Pt will be evaluated and treated for shoulder with goals added as needed as instructed by MD during future visits.  Baseline:  Goal status:MET 04/04/2023  LONG TERM GOALS: Target date: 05/10/23  Pt will reduce to less than volume difference between sides to demonstrate no active lymphedema Baseline:  Goal status: MET 04/29/2022 (1596 ml) 2.  Pt will obtain compression as needed for discharge depending on reduction Baseline:  Goal status: MET 04/11/2023   3. Pt will be able to perform AROM  elevation in supine to atleast 120 degrees without compensation  Goal Status; MET: 117 04/30/2023; 123 degrees - 05/30/23, 07/12/2023 142 degrees  4. Pt will be able to place 8 oz object on eye level shelf without UT compensation  GOALStatus: In progress; 07/12/23 - pt able to lift 1/2 to  3/4 of the way to eye level shelf before compensation  5. Pt will be tolerant of resisted exs for improving UE strength at 7 weeks post op Goal Status; PARTIALLY MET -working on HEP and trying to transition to the gym  6. Pt will be able to return to driving for greater independence Goal status: MET, has resumed local and highway driving, with occasional difficulty  7. Pt will be able to achieve 80-90 degrees Lt shoulder flexion for  increased ease with ADLs and washing hair.  Baseline: 07/12/2023 - 69 degrees   Goal Status:In Progress  8.  Pt will be educated on appropriate gym equipment and use of free wts to continue strengthening independently  Goal status: New   PLAN:  PT FREQUENCY: 1x week  PT DURATION:  4 weeks  PLANNED INTERVENTIONS: 97164- PT Re-evaluation, 97110-Therapeutic exercises, 97530- Therapeutic activity, [id number]- Neuromuscular re-education, 97535- Self Care, and [id number]- Manual therapy  PLAN FOR NEXT SESSION: Answer pts gym equipment questions. Review weighted ball on wall and forearms on wall;  progress Lt UE and scapula strength, review and cont MLD  prn; Pt needs new script for flat knit but should wait to be measured until fibrosis further decreases.   06/25/23: Faxed script for garments to Dr. Hiram Comber, PTA 07/19/23 12:41 PM     05/14/23: Start with forearms on wall holding short yellow theraband in hands:  Pull down keeping elbow bent and squeezing shoulder blade towards spine at end of pull  2 sets of 5 each  2.   Inch forearms up wall keeping elbows in only as high as you can control by keeping the shoulder blade down  2 sets of 5 each  Next:  Throw long band over top of door, close door and pull down with Lt arm, when returning to start position keep shoulder down  Cancer Rehab [phone]

## 2023-07-23 DIAGNOSIS — L738 Other specified follicular disorders: Secondary | ICD-10-CM | POA: Diagnosis not present

## 2023-07-23 DIAGNOSIS — I8391 Asymptomatic varicose veins of right lower extremity: Secondary | ICD-10-CM | POA: Diagnosis not present

## 2023-07-23 DIAGNOSIS — I8392 Asymptomatic varicose veins of left lower extremity: Secondary | ICD-10-CM | POA: Diagnosis not present

## 2023-07-23 DIAGNOSIS — L239 Allergic contact dermatitis, unspecified cause: Secondary | ICD-10-CM | POA: Diagnosis not present

## 2023-07-23 DIAGNOSIS — L570 Actinic keratosis: Secondary | ICD-10-CM | POA: Diagnosis not present

## 2023-07-23 DIAGNOSIS — L853 Xerosis cutis: Secondary | ICD-10-CM | POA: Diagnosis not present

## 2023-07-23 DIAGNOSIS — L821 Other seborrheic keratosis: Secondary | ICD-10-CM | POA: Diagnosis not present

## 2023-07-24 ENCOUNTER — Ambulatory Visit: Attending: Orthopedic Surgery

## 2023-07-24 DIAGNOSIS — M25612 Stiffness of left shoulder, not elsewhere classified: Secondary | ICD-10-CM | POA: Insufficient documentation

## 2023-07-24 DIAGNOSIS — Z96612 Presence of left artificial shoulder joint: Secondary | ICD-10-CM | POA: Insufficient documentation

## 2023-07-24 DIAGNOSIS — Z4789 Encounter for other orthopedic aftercare: Secondary | ICD-10-CM | POA: Diagnosis not present

## 2023-07-24 DIAGNOSIS — I972 Postmastectomy lymphedema syndrome: Secondary | ICD-10-CM | POA: Diagnosis not present

## 2023-07-24 NOTE — Therapy (Signed)
 OUTPATIENT PT UPPER EXTREMITY LYMPHEDEMA TREATMENT  Patient Name: Sarah Underwood MRN: [id number] DOB:December 01, 1945, 78 y.o., female Today's Date: 07/24/2023  END OF SESSION:  PT End of Session - 07/24/23 0916     Visit Number 37    Number of Visits 39    Date for PT Re-Evaluation 08/09/23    Authorization Time Period new auth sent on 06/02/23; 12 visits approved 06/04/2023-07/12/2023; 4 visits approved 3/21 - 08/09/23    Authorization - Visit Number 2    Authorization - Number of Visits 4    Progress Note Due on Visit 39    PT Start Time 0907    PT Stop Time 1005    PT Time Calculation (min) 58 min    Activity Tolerance Patient tolerated treatment well    Behavior During Therapy [facility] for tasks assessed/performed             Past Medical History:  Diagnosis Date   Basal cell carcinoma 1994   right side of node   Benign positional vertigo    with rolling over    Celiac disease    Dysrhythmia    Generalized anxiety disorder    GERD (gastroesophageal reflux disease)    Graves disease 2007   thyroid nodule   Hard of hearing    Heart murmur    History of breast cancer 1995   left - treated with mastectomy   History of radiation therapy 2001   37 sessions following recurrence of breast cancer in left chest wall   Hypertension    Knee pain, bilateral 12/14/2014   Major depressive disorder in remission    Mild persistent asthma, uncomplicated 06/29/2015   Nocturia    OA (osteoarthritis) of hip 09/08/2015   Ovarian mass    small solid nodule 8x49mm: Right ovary - stable since 2000, follow u/s do not show this.   Renal mass 2021   followed by Dr. Berneice Heinrich, [facility].  Having every 6 month follow up   Seasonal allergies    Stress fracture of metatarsal bone of left foot 12/15/2019   Vegetarian diet    Past Surgical History:  Procedure Laterality Date   APPENDECTOMY     basal cell removal  7/94   right nose   BREAST BIOPSY Right 1987   benign fibroadenoma   breast cancer  recurrence Left 01/2000   at site of left chest wall after previous mastectomy with removal and treatment with radiaton.   CARPAL TUNNEL RELEASE Right 05/08/2013   Procedure: RIGHT CARPAL TUNNEL RELEASE;  Surgeon: Wyn Forster., MD;  Location: [facility];  Service: Orthopedics;  Laterality: Right;   CATARACT EXTRACTION     03/20/14 left eye, 12/15 right eye   COLPOSCOPY W/ BIOPSY / CURETTAGE  03/12/12   benign, but + HR HPV   EYE SURGERY     bilat cataract surgery    MASTECTOMY Left 3/95   Stage IIb 0/14 LN ER/ PR + with mastectomy - Dr. Mardella Layman   MOHS SURGERY     right nare   OVARY SURGERY  1974   lt   REVERSE SHOULDER ARTHROPLASTY Left 02/26/2023   Procedure: REVERSE SHOULDER ARTHROPLASTY;  Surgeon: Yolonda Kida, MD;  Location: [facility] OR;  Service: Orthopedics;  Laterality: Left;   TONSILLECTOMY AND ADENOIDECTOMY  1950   TOTAL HIP ARTHROPLASTY Right 09/08/2015   Procedure: TOTAL HIP ARTHROPLASTY ANTERIOR APPROACH;  Surgeon: Ollen Gross, MD;  Location: [facility]S;  Service: Orthopedics;  Laterality: Right;  Patient Active Problem List   Diagnosis Date Noted   Shoulder fracture 02/23/2023   Closed fracture of proximal end of left humerus, unspecified fracture morphology, initial encounter 02/22/2023   Thyroid nodule 02/22/2023   Major depressive disorder in remission 03/29/2020   Generalized anxiety disorder    Benign positional vertigo    Hypertension    Heart murmur    Stress fracture of metatarsal bone of left foot 12/15/2019   History of revision of total replacement of right hip joint 11/26/2017   Leg edema, left 09/26/2016   Post-nasal drainage 03/30/2016   OA (osteoarthritis) of hip 09/08/2015   Mild persistent asthma, uncomplicated 06/29/2015   Hip pain, bilateral 12/14/2014   Knee pain, bilateral 12/14/2014   Postmenopausal atrophic vaginitis 02/23/2013   Graves disease 2007   History of radiation therapy 2001   History of breast cancer 1995    Basal cell carcinoma 1994    PCP: Alysia Penna, MD  REFERRING PROVIDER: Duwayne Heck, MD  REFERRING DIAG:  Diagnosis  C50.511,Z17.0 (ICD-10-CM) - Malignant neoplasm of lower-outer quadrant of right breast of female, estrogen receptor positive (HCC)  I89.0 (ICD-10-CM) - Lymphedema  Z47.89 orthopedic aftercare  S/P reverse total shoulder arthroplasty, left  Encounter for other orthopedic aftercare  Stiffness of left shoulder, not elsewhere classified  THERAPY DIAG:  Postmastectomy lymphedema  S/P reverse total shoulder arthroplasty, left  Encounter for other orthopedic aftercare  Stiffness of left shoulder, not elsewhere classified  ONSET DATE: 02/26/23  Rationale for Evaluation and Treatment: Rehabilitation  SUBJECTIVE                                                                                                                                                                                           SUBJECTIVE STATEMENT:  Each week I am noticing a little more motion and find myself reaching for things and doing things I couldn't do the week before.   PERTINENT HISTORY: Recent fall with left arm fracture.  Surgery with Reverse TSR 02/26/23.  This is the same side as her mastectomy which was diagnosed in 1995 with chest wall recurrence in 2001.  No remaining lymph nodes.   Shoulder surgery: Dr. Duwayne Heck -See media section for post-op approved exercises and okay for compression therapy.    PAIN:  PAIN:  Are you having pain? No   PRECAUTIONS: cleared for all ROM/activity of the shoulder per MD  RED FLAGS: None   WEIGHT BEARING RESTRICTIONS: No  FALLS:  Has patient fallen in last 6 months? Yes. Number of falls 1 - does not remember details of her fall. Just remembers she was going outside.  LIVING ENVIRONMENT: Lives with: lives alone Has a CNA for assistance at home.   OCCUPATION: Retired  LEISURE: Not currently exercising  HAND DOMINANCE :  Right  PRIOR LEVEL OF FUNCTION: Independent  PATIENT GOALS: prevent chronic lymphedema after this fall   OBJECTIVE (FROM EVAL) Note: Objective measures were completed at Evaluation unless otherwise noted.  COGNITION:  Overall cognitive status: Within functional limits for tasks assessed   PALPATION: +2 pitting edema back of hand, forearm, below, to around the distal 1/3rd of the upper arm.   OBSERVATIONS / OTHER ASSESSMENTS: bruising all fingers and hand, elbow, and upper arm from surgery.  Skin tight and shiny from edema.     POSTURE: wearing sling upon arrival and leaving  UPPER EXTREMITY AROM/PROM: Just starting wrist and elbow AROM - did not measure objectively.  No shoulder ROM allowed.  07/12/2023 Left UE flexion standing; 69 with compensation Left UE abd standing; 48 Left UE elevation supine ;142 Left UE ER supine;  54 LEFT IR standing; L5 sliding hand up back  05/30/23:Left UE AAROM elevation in supine=   LEFT UE AROM elevation (scaption) in supine: 123    LEFT ER AROM supine:27   LEFT IR AROM in standing: fingertips to medial glut fold and thumb reaches glut crease   LEFT UE FLEX in standing pt can briefly hold at 64 degrees when therapist assists with concentric contraction  04/04/2023 Left UE AAROM elevation in supine=   LEFT UE AROM elevation in supine: 100  04/11/2023   LEFT ER AROM supine:17   LEFT IR AROM supine:68 30 deg abd      IN SUPINE 04/30/2023 Left shoulder flex AROM in supine;  117 with elbow flexed to initiate   IR AROM  to stomach    ER  AROM 30 degrees at 30 degrees abduction     LYMPHEDEMA ASSESSMENTS:  LANDMARK RIGHT eval  At axilla 30.4  15cm proximal to olecranon 29.4  10 cm proximal to olecranon process 28.3  Olecranon process 24.7  15cm proximal to ulnar styloid 22.1  10 cm proximal to ulnar styloid process 18.7  Just proximal to ulnar styloid process 16.2  Across hand at thumb web space 19.8  At base of 2nd digit 6.4  (Blank  rows = not tested)  LANDMARK LEFT  eval LEFT 03/26/2023 LEFT 04/11/2023 LEFT  04/30/2023 LEFT 05/09/23 Left 05/23/23  At axilla 33 31 29.6 29.7 29.7 30.3  15cm proximal to olecranon 32.5 29.4 28.8 28.4 27.8 27.8  10 cm proximal to olecranon process 34.5 30.2 28.0 27.6 27.4 27.4  Olecranon process 30.5 26.6 24.0 23.4 23.3 22.9  15cm proximal to ulnar styloid  28.7 25.6 23.0 23.0 23.3 22.9  10 cm proximal to ulnar styloid process 26.5 23.4 20.5 21.1 21.9 22  Just proximal to ulnar styloid process 18.5 17.2 15.9 15.8 15.9 15.7  Across hand at thumb web space 20.4 18.8 19.2 19.0 18.4 18.8  At base of 2nd digit 7.0 6.1 5.85 6.0 5.9 5.9  (Blank rows = not tested)  Volume (at eval):          Length 7cm Rt:                         Lt:                      04/30/2023  1596 ml difference   PATIENT SURVEYS:  Quick Dash 68% limited EVAL  04/29/2022     34% 07/12/2023    20.45%   TODAY'S TREATMENT:                                                                                                                                         DATE:  07/24/23: Therapeutic Activities Weighted Machines for following: Lat pull downs 25#  3 x 10, then bil triceps 15# 2 x 10, with short rest periods between each UE Ranger into Lt UE flex x 2 mins, then varying positions of scaption, and cw/ccw circles and windshield wipers Finger Ladder into Lt UE flex to #32 x 5, then Lt scaption up to #20-30 x 3 FreeMotion Machine 3# rows 3 x 10 (7# too heavy) Forearms on wall for forearm side tap with shoulders at 90 degrees x 5 each side Manual Therapy  P/ROM with Lt shoulder into flex, er and scaption with scapular depression by therapist  07/19/23: Therapeutic Exercises Pulleys into flex and scaption x 3 mins to warm muscles up with VC's to hold stretch a few sec to allow for increased muscle length UE Ranger Lt shoulder flex x 2 mins, then scaption x 1 min, next Lt circles, then attempted Rt circles but  required Lt hand to assist motion Therapeutic Activities Free Motion Machine:  Lat pull downs with pulleys 10# x 15 Tricep Extensions bil (unable to do Lt only due to weakness) 5# 2 x 12, VC's to control eccentric contractions, second set 10# Rows with 10# in standing  Manual Therapy P/ROM with Lt shoulder into flex and scaption with scapular depression by therapist STM with cocoa butter to incision and ant/post shoulder, also to pectoralis insertion and lateral scap border  07/12/2023 Discussed progress toward goals, improvements and deficits Pulleys 2 min flex and abduction to warm up  Shoulder ranger x 10 flex, 5 abduction PROM left shoulder flexion, abd, ER Measured AROM left shoulder standing and supine Discussed importance of checking her arms tolerance for exercise before increasing reps too much and also wearing compression sleeve when at the gym      PATIENT EDUCATION:  Access Code: [email or web address] URL: [email or web address] Date: 04/13/2023 Prepared by: Alvira Monday  Exercises - Standing Isometric Shoulder Internal Rotation at Doorway  - 1 x daily - 7 x weekly - 3 sets - 10 reps - Standing Isometric Shoulder External Rotation with Doorway  - 1 x daily - 7 x weekly - 1 sets - 5-10 reps - Isometric Shoulder Flexion at Wall  - 1 x daily - 7 x weekly - 1 sets - 5-10 reps - 5 hold - Isometric Shoulder Extension at Wall  - 1 x daily - 7 x weekly - 1 sets - 5-10 reps - Isometric Shoulder Abduction at Wall  - 1 x daily - 7 x weekly - 1 sets - 5-10 reps   05/02/23: Education details: Tricep ext with band over  top of door Person educated: Patient Education method: Explanation, Demonstration, and Verbal cues, and tactile cues Education comprehension: verbalized understanding, returned demonstration, tactile and VC's   HOME EXERCISE PROGRAM: Scapular retraction, shoulder extension yellow band x 10 Supine alphabet Isometrics for Lt shoulder Standing dowel  exercises Self MLD Forearms on wall for scapular strength with yellow theraband Lt tricep ext with yellow theraband over door  ASSESSMENT:  CLINICAL IMPRESSION: Pt is progressing well overall with strength of her Lt UE and postural strength. She demonstrates improved ability of scapular stabs with exs. Continued with with progressing Lt UE and scapular strength. Pt reports at next session would like to practice getting up off the floor to see if her UE strength is such that she would be able to do this if the situation arrives.    OBJECTIVE IMPAIRMENTS: decreased activity tolerance, decreased knowledge of condition, decreased knowledge of use of DME, decreased mobility, decreased ROM, and increased edema.   ACTIVITY LIMITATIONS: carrying, lifting, sleeping, bathing, dressing, and reach over head  PARTICIPATION LIMITATIONS: meal prep, cleaning, laundry, shopping, and community activity  PERSONAL FACTORS: Age and 1 comorbidity: ALND and radiation hx  are also affecting patient's functional outcome.   REHAB POTENTIAL: Excellent  CLINICAL DECISION MAKING: Evolving/moderate complexity  EVALUATION COMPLEXITY: Moderate  GOALS: Goals reviewed with patient? Yes  SHORT TERM GOALS: Target date: 04/08/23    Pt will be ind with self MLD for edema reduction Baseline: Goal status: MET 2.  Pt will decrease volume to or less to demonstrate improving edema status Baseline:  Goal status: MET 1596  (8 ml more) 04/30/2023 3.  Pt will be evaluated and treated for shoulder with goals added as needed as instructed by MD during future visits.  Baseline:  Goal status:MET 04/04/2023  LONG TERM GOALS: Target date: 05/10/23  Pt will reduce to less than volume difference between sides to demonstrate no active lymphedema Baseline:  Goal status: MET 04/29/2022 (1596 ml) 2.  Pt will obtain compression as needed for discharge depending on reduction Baseline:  Goal status: MET 04/11/2023   3. Pt  will be able to perform AROM  elevation in supine to atleast 120 degrees without compensation  Goal Status; MET: 117 04/30/2023; 123 degrees - 05/30/23, 07/12/2023 142 degrees  4. Pt will be able to place 8 oz object on eye level shelf without UT compensation  GOALStatus: In progress; 07/12/23 - pt able to lift 1/2 to  3/4 of the way to eye level shelf before compensation  5. Pt will be tolerant of resisted exs for improving UE strength at 7 weeks post op Goal Status; PARTIALLY MET -working on HEP and trying to transition to the gym  6. Pt will be able to return to driving for greater independence Goal status: MET, has resumed local and highway driving, with occasional difficulty  7. Pt will be able to achieve 80-90 degrees Lt shoulder flexion for increased ease with ADLs and washing hair.  Baseline: 07/12/2023 - 69 degrees   Goal Status:In Progress  8.  Pt will be educated on appropriate gym equipment and use of free wts to continue strengthening independently  Goal status: New   PLAN:  PT FREQUENCY: 1x week  PT DURATION:  4 weeks  PLANNED INTERVENTIONS: 97164- PT Re-evaluation, 97110-Therapeutic exercises, 97530- Therapeutic activity, [id number]- Neuromuscular re-education, 97535- Self Care, and [id number]- Manual therapy  PLAN FOR NEXT SESSION: Practice getting up from the floor with pt over next 2 sessions before D/C. Review weighted  ball on wall and forearms on wall;  progress Lt UE and scapula strength, review and cont MLD prn; Pt needs new script for flat knit but should wait to be measured until fibrosis further decreases.   06/25/23: Faxed script for garments to Dr. Hiram Comber, PTA 07/24/23 10:17 AM     05/14/23: Start with forearms on wall holding short yellow theraband in hands:  Pull down keeping elbow bent and squeezing shoulder blade towards spine at end of pull  2 sets of 5 each  2.   Inch forearms up wall keeping elbows in only as high as you can control by  keeping the shoulder blade down  2 sets of 5 each  Next:  Throw long band over top of door, close door and pull down with Lt arm, when returning to start position keep shoulder down  Cancer Re[facility] [phone]

## 2023-07-26 DIAGNOSIS — Z96612 Presence of left artificial shoulder joint: Secondary | ICD-10-CM | POA: Diagnosis not present

## 2023-07-31 ENCOUNTER — Ambulatory Visit

## 2023-07-31 DIAGNOSIS — M25551 Pain in right hip: Secondary | ICD-10-CM | POA: Diagnosis not present

## 2023-07-31 DIAGNOSIS — Z96612 Presence of left artificial shoulder joint: Secondary | ICD-10-CM

## 2023-07-31 DIAGNOSIS — I972 Postmastectomy lymphedema syndrome: Secondary | ICD-10-CM | POA: Diagnosis not present

## 2023-07-31 DIAGNOSIS — Z4789 Encounter for other orthopedic aftercare: Secondary | ICD-10-CM | POA: Diagnosis not present

## 2023-07-31 DIAGNOSIS — M25612 Stiffness of left shoulder, not elsewhere classified: Secondary | ICD-10-CM

## 2023-07-31 NOTE — Therapy (Signed)
 OUTPATIENT PT UPPER EXTREMITY LYMPHEDEMA TREATMENT  Patient Name: Sarah Underwood MRN: [id number] DOB:04/08/1946, 78 y.o., female Today's Date: 07/31/2023  END OF SESSION:  PT End of Session - 07/31/23 1204     Visit Number 38    Number of Visits 39    Date for PT Re-Evaluation 08/09/23    Authorization Type [employer] auth done (07/12/23)    Authorization Time Period new auth sent on 06/02/23; 12 visits approved 06/04/2023-07/12/2023; 4 visits approved 3/21 - 08/09/23    Authorization - Visit Number 3    Authorization - Number of Visits 4    Progress Note Due on Visit 39    PT Start Time 1204    PT Stop Time 1254    PT Time Calculation (min) 50 min    Activity Tolerance Patient tolerated treatment well    Behavior During Therapy WFL for tasks assessed/performed             Past Medical History:  Diagnosis Date   Basal cell carcinoma 1994   right side of node   Benign positional vertigo    with rolling over    Celiac disease    Dysrhythmia    Generalized anxiety disorder    GERD (gastroesophageal reflux disease)    Graves disease 2007   thyroid nodule   Hard of hearing    Heart murmur    History of breast cancer 1995   left - treated with mastectomy   History of radiation therapy 2001   37 sessions following recurrence of breast cancer in left chest wall   Hypertension    Knee pain, bilateral 12/14/2014   Major depressive disorder in remission    Mild persistent asthma, uncomplicated 06/29/2015   Nocturia    OA (osteoarthritis) of hip 09/08/2015   Ovarian mass    small solid nodule 8x28mm: Right ovary - stable since 2000, follow u/s do not show this.   Renal mass 2021   followed by Dr. Berneice Heinrich, [facility].  Having every 6 month follow up   Seasonal allergies    Stress fracture of metatarsal bone of left foot 12/15/2019   Vegetarian diet    Past Surgical History:  Procedure Laterality Date   APPENDECTOMY     basal cell removal  7/94   right nose   BREAST BIOPSY Right  1987   benign fibroadenoma   breast cancer recurrence Left 01/2000   at site of left chest wall after previous mastectomy with removal and treatment with radiaton.   CARPAL TUNNEL RELEASE Right 05/08/2013   Procedure: RIGHT CARPAL TUNNEL RELEASE;  Surgeon: Wyn Forster., MD;  Location: [facility];  Service: Orthopedics;  Laterality: Right;   CATARACT EXTRACTION     03/20/14 left eye, 12/15 right eye   COLPOSCOPY W/ BIOPSY / CURETTAGE  03/12/12   benign, but + HR HPV   EYE SURGERY     bilat cataract surgery    MASTECTOMY Left 3/95   Stage IIb 0/14 LN ER/ PR + with mastectomy - Dr. Mardella Layman   MOHS SURGERY     right nare   OVARY SURGERY  1974   lt   REVERSE SHOULDER ARTHROPLASTY Left 02/26/2023   Procedure: REVERSE SHOULDER ARTHROPLASTY;  Surgeon: Yolonda Kida, MD;  Location: [facility];  Service: Orthopedics;  Laterality: Left;   TONSILLECTOMY AND ADENOIDECTOMY  1950   TOTAL HIP ARTHROPLASTY Right 09/08/2015   Procedure: TOTAL HIP ARTHROPLASTY ANTERIOR APPROACH;  Surgeon: Ollen Gross, MD;  Location: [facility];  Service: Orthopedics;  Laterality: Right;   Patient Active Problem List   Diagnosis Date Noted   Shoulder fracture 02/23/2023   Closed fracture of proximal end of left humerus, unspecified fracture morphology, initial encounter 02/22/2023   Thyroid nodule 02/22/2023   Major depressive disorder in remission 03/29/2020   Generalized anxiety disorder    Benign positional vertigo    Hypertension    Heart murmur    Stress fracture of metatarsal bone of left foot 12/15/2019   History of revision of total replacement of right hip joint 11/26/2017   Leg edema, left 09/26/2016   Post-nasal drainage 03/30/2016   OA (osteoarthritis) of hip 09/08/2015   Mild persistent asthma, uncomplicated 06/29/2015   Hip pain, bilateral 12/14/2014   Knee pain, bilateral 12/14/2014   Postmenopausal atrophic vaginitis 02/23/2013   Graves disease 2007   History of radiation  therapy 2001   History of breast cancer 1995   Basal cell carcinoma 1994    PCP: Alysia Penna, MD  REFERRING PROVIDER: Duwayne Heck, MD  REFERRING DIAG:  Diagnosis  C50.511,Z17.0 (ICD-10-CM) - Malignant neoplasm of lower-outer quadrant of right breast of female, estrogen receptor positive (HCC)  I89.0 (ICD-10-CM) - Lymphedema  Z47.89 orthopedic aftercare  S/P reverse total shoulder arthroplasty, left  Encounter for other orthopedic aftercare  Stiffness of left shoulder, not elsewhere classified  THERAPY DIAG:  Postmastectomy lymphedema  S/P reverse total shoulder arthroplasty, left  Encounter for other orthopedic aftercare  Stiffness of left shoulder, not elsewhere classified  ONSET DATE: 02/26/23  Rationale for Evaluation and Treatment: Rehabilitation  SUBJECTIVE                                                                                                                                                                                           SUBJECTIVE STATEMENT:  The Dr is amazed especially with my IR. He has released me to continue with my HEP. I see MD again in 6 months if I am still living here. I am sleeping on my left side more but that makes my arm sore the next day. Left UE swelling has improved. It will be better when I get my compression sleeve. I have the most difficulty reaching to the side, but I can wash my hair easily and normally now and can reach to low cabinets.  PERTINENT HISTORY: Recent fall with left arm fracture.  Surgery with Reverse TSR 02/26/23.  This is the same side as her mastectomy which was diagnosed in 1995 with chest wall recurrence in 2001.  No remaining lymph nodes.   Shoulder surgery: Dr. Duwayne Heck -See media section  for post-op approved exercises and okay for compression therapy.    PAIN:  PAIN:  Are you having pain? Not presently   PRECAUTIONS: cleared for all ROM/activity of the shoulder per MD  RED  FLAGS: None   WEIGHT BEARING RESTRICTIONS: No  FALLS:  Has patient fallen in last 6 months? Yes. Number of falls 1 - does not remember details of her fall. Just remembers she was going outside.   LIVING ENVIRONMENT: Lives with: lives alone Has a CNA for assistance at home.   OCCUPATION: Retired  LEISURE: Not currently exercising  HAND DOMINANCE : Right  PRIOR LEVEL OF FUNCTION: Independent  PATIENT GOALS: prevent chronic lymphedema after this fall   OBJECTIVE (FROM EVAL) Note: Objective measures were completed at Evaluation unless otherwise noted.  COGNITION:  Overall cognitive status: Within functional limits for tasks assessed   PALPATION: +2 pitting edema back of hand, forearm, below, to around the distal 1/3rd of the upper arm.   OBSERVATIONS / OTHER ASSESSMENTS: bruising all fingers and hand, elbow, and upper arm from surgery.  Skin tight and shiny from edema.     POSTURE: wearing sling upon arrival and leaving  UPPER EXTREMITY AROM/PROM: Just starting wrist and elbow AROM - did not measure objectively.  No shoulder ROM allowed.   07/31/2023 Left shoulder flexion standing 110 with sligh compensation Left shoulder abd standing  75 with compensation IR to T10 07/12/2023 Left UE flexion standing; 69 with compensation Left UE abd standing; 48 Left UE elevation supine ;142 Left UE ER supine;  54 LEFT IR standing; L5 sliding hand up back  05/30/23:Left UE AAROM elevation in supine=   LEFT UE AROM elevation (scaption) in supine: 123    LEFT ER AROM supine:27   LEFT IR AROM in standing: fingertips to medial glut fold and thumb reaches glut crease   LEFT UE FLEX in standing pt can briefly hold at 64 degrees when therapist assists with concentric contraction  04/04/2023 Left UE AAROM elevation in supine=   LEFT UE AROM elevation in supine: 100  04/11/2023   LEFT ER AROM supine:17   LEFT IR AROM supine:68 30 deg abd      IN SUPINE 04/30/2023 Left shoulder flex AROM  in supine;  117 with elbow flexed to initiate   IR AROM  to stomach    ER  AROM 30 degrees at 30 degrees abduction     LYMPHEDEMA ASSESSMENTS:  LANDMARK RIGHT eval  At axilla 30.4  15cm proximal to olecranon 29.4  10 cm proximal to olecranon process 28.3  Olecranon process 24.7  15cm proximal to ulnar styloid 22.1  10 cm proximal to ulnar styloid process 18.7  Just proximal to ulnar styloid process 16.2  Across hand at thumb web space 19.8  At base of 2nd digit 6.4  (Blank rows = not tested)  LANDMARK LEFT  eval LEFT 03/26/2023 LEFT 04/11/2023 LEFT  04/30/2023 LEFT 05/09/23 Left 05/23/23  At axilla 33 31 29.6 29.7 29.7 30.3  15cm proximal to olecranon 32.5 29.4 28.8 28.4 27.8 27.8  10 cm proximal to olecranon process 34.5 30.2 28.0 27.6 27.4 27.4  Olecranon process 30.5 26.6 24.0 23.4 23.3 22.9  15cm proximal to ulnar styloid  28.7 25.6 23.0 23.0 23.3 22.9  10 cm proximal to ulnar styloid process 26.5 23.4 20.5 21.1 21.9 22  Just proximal to ulnar styloid process 18.5 17.2 15.9 15.8 15.9 15.7  Across hand at thumb web space 20.4 18.8 19.2 19.0 18.4 18.8  At  base of 2nd digit 7.0 6.1 5.85 6.0 5.9 5.9  (Blank rows = not tested)  Volume (at eval):          Length 7cm Rt:                         Lt:                      04/30/2023  1596 ml difference   PATIENT SURVEYS:  Quick Dash 68% limited EVAL 04/29/2022     34% 07/12/2023    20.45% 07/31/2023: 22.73  TODAY'S TREATMENT:                                                                                                                                         DATE:  07/31/2023 Discussed progress and checked goals Shoulder ladder x 5 to number 32 Shoulder ranger 2# on wrist x 10 flexion, scaption, abd no wt x 5 Ball rolls gn x 10 up and down, side to side Shoulder extension on cable 3# x 10 Forearms on wall for forearm side tap with shoulders at 90 degrees x 5 each side Discussed exs to continue with at home  including snow angels, ball rolls on wall  Pt has not yet received compression sleeve; sleeve should be checked before DC  07/24/23: Therapeutic Activities Weighted Machines for following: Lat pull downs 25#  3 x 10, then bil triceps 15# 2 x 10, with short rest periods between each UE Ranger into Lt UE flex x 2 mins, then varying positions of scaption, and cw/ccw circles and windshield wipers Finger Ladder into Lt UE flex to #32 x 5, then Lt scaption up to #20-30 x 3 FreeMotion Machine 3# rows 3 x 10 (7# too heavy) Forearms on wall for forearm side tap with shoulders at 90 degrees x 5 each side Manual Therapy  P/ROM with Lt shoulder into flex, er and scaption with scapular depression by therapist  07/19/23: Therapeutic Exercises Pulleys into flex and scaption x 3 mins to warm muscles up with VC's to hold stretch a few sec to allow for increased muscle length UE Ranger Lt shoulder flex x 2 mins, then scaption x 1 min, next Lt circles, then attempted Rt circles but required Lt hand to assist motion Therapeutic Activities Free Motion Machine:  Lat pull downs with pulleys 10# x 15 Tricep Extensions bil (unable to do Lt only due to weakness) 5# 2 x 12, VC's to control eccentric contractions, second set 10# Rows with 10# in standing  Manual Therapy P/ROM with Lt shoulder into flex and scaption with scapular depression by therapist STM with cocoa butter to incision and ant/post shoulder, also to pectoralis insertion and lateral scap border  07/12/2023 Discussed progress toward goals, improvements and deficits Pulleys 2 min flex and abduction to warm up  Shoulder ranger x 10 flex, 5 abduction PROM left shoulder flexion, abd, ER Measured AROM left shoulder standing and supine Discussed importance of checking her arms tolerance for exercise before increasing reps too much and also wearing compression sleeve when at the gym      PATIENT EDUCATION:  Access Code: [email or web address] URL:  [email or web address] Date: 04/13/2023 Prepared by: Alvira Monday  Exercises - Standing Isometric Shoulder Internal Rotation at Doorway  - 1 x daily - 7 x weekly - 3 sets - 10 reps - Standing Isometric Shoulder External Rotation with Doorway  - 1 x daily - 7 x weekly - 1 sets - 5-10 reps - Isometric Shoulder Flexion at Wall  - 1 x daily - 7 x weekly - 1 sets - 5-10 reps - 5 hold - Isometric Shoulder Extension at Wall  - 1 x daily - 7 x weekly - 1 sets - 5-10 reps - Isometric Shoulder Abduction at Wall  - 1 x daily - 7 x weekly - 1 sets - 5-10 reps   05/02/23: Education details: Tricep ext with band over top of door Person educated: Patient Education method: Explanation, Demonstration, and Verbal cues, and tactile cues Education comprehension: verbalized understanding, returned demonstration, tactile and VC's   HOME EXERCISE PROGRAM: Scapular retraction, shoulder extension yellow band x 10 Supine alphabet Isometrics for Lt shoulder Standing dowel exercises Self MLD Forearms on wall for scapular strength with yellow theraband Lt tricep ext with yellow theraband over door  ASSESSMENT:  CLINICAL IMPRESSION: Pt has made excellent improvements in shoulder AROM over the last few weeks. She has achieved all shoulder goals except for mild compensation with reach to shelf. She is working out now at [employer] for continued strength . She is ready to be released for her shoulder however, she is still awaiting her compression sleeve and requires her last visit to check the sleeve when it arrives. If not in by Next week Tawni Carnes will ask for a date extension.  OBJECTIVE IMPAIRMENTS: decreased activity tolerance, decreased knowledge of condition, decreased knowledge of use of DME, decreased mobility, decreased ROM, and increased edema.   ACTIVITY LIMITATIONS: carrying, lifting, sleeping, bathing, dressing, and reach over head  PARTICIPATION LIMITATIONS: meal prep, cleaning, laundry,  shopping, and community activity  PERSONAL FACTORS: Age and 1 comorbidity: ALND and radiation hx  are also affecting patient's functional outcome.   REHAB POTENTIAL: Excellent  CLINICAL DECISION MAKING: Evolving/moderate complexity  EVALUATION COMPLEXITY: Moderate  GOALS: Goals reviewed with patient? Yes  SHORT TERM GOALS: Target date: 04/08/23    Pt will be ind with self MLD for edema reduction Baseline: Goal status: MET 2.  Pt will decrease volume to or less to demonstrate improving edema status Baseline:  Goal status: MET 1596  (8 ml more) 04/30/2023 3.  Pt will be evaluated and treated for shoulder with goals added as needed as instructed by MD during future visits.  Baseline:  Goal status:MET 04/04/2023  LONG TERM GOALS: Target date: 05/10/23  Pt will reduce to less than volume difference between sides to demonstrate no active lymphedema Baseline:  Goal status: MET 04/29/2022 (1596 ml) 2.  Pt will obtain compression as needed for discharge depending on reduction Baseline:  Goal status: MET 04/11/2023   3. Pt will be able to perform AROM  elevation in supine to atleast 120 degrees without compensation  Goal Status; MET: 117 04/30/2023; 123 degrees - 05/30/23, 07/12/2023 142 degrees  4. Pt will be able to place 8 oz object  on eye level shelf without UT compensation  GOALStatus: In progress; MET with mild compensation  5. Pt will be tolerant of resisted exs for improving UE strength at 7 weeks post op Goal Status; MET 07/31/2023 6. Pt will be able to return to driving for greater independence Goal status: MET, has resumed local and highway driving, with occasional difficulty  7. Pt will be able to achieve 80-90 degrees Lt shoulder flexion for increased ease with ADLs and washing hair.  Baseline: 07/12/2023 - 69 degrees , 07/31/2023 110 MET 07/31/2023  Goal Status:MET 07/31/2023   8.  Pt will be educated on appropriate gym equipment and use of free wts to continue  strengthening independently  Goal status: MET 07/31/2023  PLAN:  PT FREQUENCY: 1x week  PT DURATION:  4 weeks  PLANNED INTERVENTIONS: 97164- PT Re-evaluation, 97110-Therapeutic exercises, 97530- Therapeutic activity, 97112- Neuromuscular re-education, 97535- Self Care, and 32440- Manual therapy  PLAN FOR NEXT SESSION: Pt ready to be released for her shoulder and has met all goals. Requires last visit for compression garment check. If not in by next week Tawni Carnes will request a date extension for last visit. May need to remind Madonna about this 06/25/23: Faxed script for garments to Dr. Hiram Comber, PTA 07/31/23 1:03 PM     05/14/23: Start with forearms on wall holding short yellow theraband in hands:  Pull down keeping elbow bent and squeezing shoulder blade towards spine at end of pull  2 sets of 5 each  2.   Inch forearms up wall keeping elbows in only as high as you can control by keeping the shoulder blade down  2 sets of 5 each  Next:  Throw long band over top of door, close door and pull down with Lt arm, when returning to start position keep shoulder down  Cancer Rehab [phone]

## 2023-08-07 ENCOUNTER — Ambulatory Visit

## 2023-08-09 ENCOUNTER — Other Ambulatory Visit: Payer: Self-pay

## 2023-08-09 DIAGNOSIS — I10 Essential (primary) hypertension: Secondary | ICD-10-CM

## 2023-08-09 DIAGNOSIS — M51362 Other intervertebral disc degeneration, lumbar region with discogenic back pain and lower extremity pain: Secondary | ICD-10-CM | POA: Diagnosis not present

## 2023-08-09 DIAGNOSIS — R002 Palpitations: Secondary | ICD-10-CM

## 2023-08-09 DIAGNOSIS — M9901 Segmental and somatic dysfunction of cervical region: Secondary | ICD-10-CM | POA: Diagnosis not present

## 2023-08-09 DIAGNOSIS — M9903 Segmental and somatic dysfunction of lumbar region: Secondary | ICD-10-CM | POA: Diagnosis not present

## 2023-08-09 DIAGNOSIS — M50322 Other cervical disc degeneration at C5-C6 level: Secondary | ICD-10-CM | POA: Diagnosis not present

## 2023-08-09 MED ORDER — METOPROLOL SUCCINATE ER 50 MG PO TB24: 50.0000 mg | ORAL_TABLET | Freq: Every day | ORAL | 0 refills | Status: AC

## 2023-08-16 DIAGNOSIS — M9901 Segmental and somatic dysfunction of cervical region: Secondary | ICD-10-CM | POA: Diagnosis not present

## 2023-08-16 DIAGNOSIS — M50322 Other cervical disc degeneration at C5-C6 level: Secondary | ICD-10-CM | POA: Diagnosis not present

## 2023-08-16 DIAGNOSIS — M9903 Segmental and somatic dysfunction of lumbar region: Secondary | ICD-10-CM | POA: Diagnosis not present

## 2023-08-16 DIAGNOSIS — M51362 Other intervertebral disc degeneration, lumbar region with discogenic back pain and lower extremity pain: Secondary | ICD-10-CM | POA: Diagnosis not present

## 2023-08-17 ENCOUNTER — Inpatient Hospital Stay: Payer: Medicare PPO | Attending: Oncology | Admitting: Oncology

## 2023-08-17 VITALS — BP 148/64 | HR 78 | Temp 98.2°F | Resp 18 | Ht 62.0 in | Wt 154.6 lb

## 2023-08-17 DIAGNOSIS — M8580 Other specified disorders of bone density and structure, unspecified site: Secondary | ICD-10-CM | POA: Insufficient documentation

## 2023-08-17 DIAGNOSIS — Z170 Estrogen receptor positive status [ER+]: Secondary | ICD-10-CM | POA: Diagnosis not present

## 2023-08-17 DIAGNOSIS — I890 Lymphedema, not elsewhere classified: Secondary | ICD-10-CM | POA: Diagnosis not present

## 2023-08-17 DIAGNOSIS — C50919 Malignant neoplasm of unspecified site of unspecified female breast: Secondary | ICD-10-CM | POA: Insufficient documentation

## 2023-08-17 DIAGNOSIS — C50511 Malignant neoplasm of lower-outer quadrant of right female breast: Secondary | ICD-10-CM

## 2023-08-17 DIAGNOSIS — E041 Nontoxic single thyroid nodule: Secondary | ICD-10-CM | POA: Insufficient documentation

## 2023-08-17 NOTE — Progress Notes (Signed)
  [facility] OFFICE PROGRESS NOTE   Diagnosis: Breast cancer  INTERVAL HISTORY:   Sarah Underwood returns as scheduled.  She underwent left shoulder reverse arthroplasty 02/26/2024.  She developed lymphedema in the left arm following surgery.  She was referred to the lymphedema clinic and reports improvement.  She has been discharged from the lymphedema clinic.  She is ordering a lymphedema sleeve. She has neuropathy in the legs and pain at the right knee.  She continues anastrozole .  No change at the chest wall.  She is due for a right mammogram.  She has hot flashes.  Objective:  Vital signs in last 24 hours:  Blood pressure (!) 148/64, pulse 78, temperature 98.2 F (36.8 C), temperature source Temporal, resp. rate 18, height 5\' 2"  (1.575 m), weight 154 lb 9.6 oz (70.1 kg), last menstrual period 04/25/2003, SpO2 100%.     Lymphatics: No cervical, supraclavicular, axillary, or inguinal nodes Resp: Lungs clear bilaterally Cardio: Regular rate and rhythm GI: No hepatosplenomegaly Vascular: No leg edema Breast: Right breast without mass.  Status post left mastectomy.  No evidence for chest wall tumor recurrence.  Portacath/PICC-without erythema  Lab Results:  Lab Results  Component Value Date   WBC 10.2 02/26/2023   HGB 11.7 (L) 02/26/2023   HCT 33.9 (L) 02/26/2023   MCV 89.2 02/26/2023   PLT 318 02/26/2023   NEUTROABS 6.8 02/26/2023    CMP  Lab Results  Component Value Date   NA 133 (L) 02/26/2023   K 4.6 02/26/2023   CL 99 02/26/2023   CO2 22 02/26/2023   GLUCOSE 118 (H) 02/26/2023   BUN 19 02/26/2023   CREATININE 1.05 (H) 02/26/2023   CALCIUM  8.9 02/26/2023   PROT 7.3 04/09/2020   ALBUMIN  4.0 04/09/2020   AST 19 04/09/2020   ALT 17 04/09/2020   ALKPHOS 72 04/09/2020   BILITOT 0.7 04/09/2020   GFRNONAA 55 (L) 02/26/2023   GFRAA >60 09/10/2015    Lab Results  Component Value Date   CA125 5 06/06/2016    Lab Results  Component Value Date    INR 0.97 08/31/2015   LABPROT 13.1 08/31/2015    Imaging:  No results found.  Medications: I have reviewed the patient's current medications.   Assessment/Plan: Sarah Underwood was diagnosed with left-sided breast cancer 1995.  She developed a chest wall recurrence in October 2001.  She will continue indefinite Arimidex . Sarah Underwood is in clinical remission.  She will schedule a right mammogram prior to relocating to [place] .  She continues follow-up with her primary provider for evaluation of a thyroid  nodule and management of osteopenia.  Left arm lymphedema has improved with physical therapy.  Sarah Underwood indicates she will be relocating to [facility] [place]  in August.  She will not be scheduled for a follow-up appointment in the oncology clinic.  I am available to see her as needed.  She will establish with medical providers in [place] .   Coni Deep, MD  08/17/2023  10:27 AM

## 2023-08-23 DIAGNOSIS — M9903 Segmental and somatic dysfunction of lumbar region: Secondary | ICD-10-CM | POA: Diagnosis not present

## 2023-08-23 DIAGNOSIS — M9901 Segmental and somatic dysfunction of cervical region: Secondary | ICD-10-CM | POA: Diagnosis not present

## 2023-08-23 DIAGNOSIS — M51362 Other intervertebral disc degeneration, lumbar region with discogenic back pain and lower extremity pain: Secondary | ICD-10-CM | POA: Diagnosis not present

## 2023-08-23 DIAGNOSIS — M50322 Other cervical disc degeneration at C5-C6 level: Secondary | ICD-10-CM | POA: Diagnosis not present

## 2023-08-30 DIAGNOSIS — M9903 Segmental and somatic dysfunction of lumbar region: Secondary | ICD-10-CM | POA: Diagnosis not present

## 2023-08-30 DIAGNOSIS — M9901 Segmental and somatic dysfunction of cervical region: Secondary | ICD-10-CM | POA: Diagnosis not present

## 2023-08-30 DIAGNOSIS — M51362 Other intervertebral disc degeneration, lumbar region with discogenic back pain and lower extremity pain: Secondary | ICD-10-CM | POA: Diagnosis not present

## 2023-08-30 DIAGNOSIS — M50322 Other cervical disc degeneration at C5-C6 level: Secondary | ICD-10-CM | POA: Diagnosis not present

## 2023-08-30 NOTE — Progress Notes (Signed)
 I saw Sarah Underwood in neurology clinic on 09/12/23 in follow up for numbness and burning in legs and imbalance.  HPI: Sarah Underwood is a 78 y.o. year old female with a history of HTN, HLD, pre-DM, asthma, OA, vit D deficiency, breast cancer (s/p left mastectomy), celiac disease, gallstones, hearing loss who we last saw on 04/05/23.  To briefly review: Patient is having pain in her legs, described as burning. She has a history of pre-DM and celiac disease, so there was concern for neuropathy. She has had symptoms for years. It was originally burning and tingling in both feet. Over time, it has spread into the legs and maybe is more prominent on the right now. She feels there is poor sensation in her feet. She had a recent fall, 02/22/23. She was loading her car and fell while going into the house. She does not remember how she fell. She noticed a box with all these cards spread on the floor. She felt her arm disconnect from her shoulder. She called 911. She does not remember much after that. She had broken her left humerus. She had surgery on 02/26/23. She is doing PT with lymphedema clinic (as this had significant swelling after her fall).   She endorses back pain.   Prior to her fall, she felt like she was in fairly decent shape and strength as she had been going to a [employer].   She mentions chronic knee swelling for which she sees [facility] as well.   Patient also has a history of left breast cancer. She had a left mastectomy (1995), with recurrence in 2001, with radiation in 2002. She never had chemotherapy. She is currently on Arimidex .   Of note, patient was previously seen in this office for memory. Neuropsych testing was normal. Anxiety and sleep dysfunction were felt to be cause. This is not currently an issue for patient.   The patient denies symptoms suggestive of oculobulbar weakness including diplopia, ptosis, dysphagia, poor saliva control, dysarthria/dysphonia,  impaired mastication, facial weakness/droop. She does endorse chronic dry eyes, but no dry mouth. She has celiac, but since changing diet she has not had GI issues.   She report any constitutional symptoms like fever, night sweats, anorexia or unintentional weight loss. She did lose 11 lbs while in the hospital.   EtOH use: Very rare  Restrictive diet? Gluten free Family history of neuropathy/myopathy/neurologic disease? No, but daughter has Lupus and mother probably also did  Most recent Assessment and Plan (04/05/23): Sarah Underwood is a 78 y.o. female who presents for evaluation of numbness and burning in legs, imbalance, and recent fall. She has a relevant medical history of HTN, HLD, pre-DM, asthma, OA, vit D deficiency, breast cancer (s/p left mastectomy), celiac disease, gallstones, hearing loss. Her neurological examination is pertinent for diminished sensation in bilateral lower extremities with distal to proximal gradient. There is some asymmetry, with symptoms worse on the right than the left and weakness of right thigh flexion that is not present on the left. Symptoms are consistent with polyneuropathy, but the asymmetry is concerning for an overlapping process such as lumbosacral radiculopathy (?right L3). Her known risk factors for neuropathy include pre-DM and celiac disease. I will get labs to look for other treatable causes and get an MRI to evaluate for radiculopathy. EMG would be less helpful given patient's age and that sensory responses may be normally absent after the age of 54.   PLAN: -Blood work: B1, B6, IFE -  MRI lumbar spine wo contrast -Lidocaine  cream PRN -Alpha lipoic acid 600 mg once or twice daily -Foot care and inspection discussed -Continue PT  Since their last visit: I attempted to call and sent a MyChart message about MRI lumbar spine results but never heard back from patient. Per my 05/09/23 phone note: I attempted to call, but obviously missed you. I wanted  to let you know that your MRI of the low back did show some tight areas around nerves (moderate to severe) that could be explaining why symptoms were worse on the right than left. The first step would be to do physical therapy, which you are already doing.   How are things going? How is your pain?   Are you interested in trying a nerve pain medication to help, such as gabapentin?  Patient did get my message but thought I was just giving her results, so she did not call back.  Patient started getting shock wave therapy in [place] at recommendation of a family member. She has had 5 treatments thus far. She thinks the burning and shocking sensation has improved. She had one fall in 04/2023. She attributes this to a muddy place causing her to fall in the yard.  Of note, patient is moving to [place] in August to be closer to her daughter and granddaughter.   MEDICATIONS:  Outpatient Encounter Medications as of 09/12/2023  Medication Sig Note   acetaminophen  (TYLENOL ) 650 MG CR tablet Take 1,300 mg by mouth every 8 (eight) hours as needed for pain. 08/17/2023: Takes bid routinely   albuterol  (PROVENTIL  HFA;VENTOLIN  HFA) 108 (90 Base) MCG/ACT inhaler Inhale 2 puffs into the lungs every 4 (four) hours as needed for wheezing. (Patient not taking: Reported on 08/17/2023)    amLODipine  (NORVASC ) 2.5 MG tablet Take 1 tablet by mouth daily.    anastrozole  (ARIMIDEX ) 1 MG tablet TAKE 1 TABLET BY MOUTH EVERY DAY    Artificial Tear Solution (TEARS NATURALE OP) Place 2 drops into both eyes daily as needed (For dry eyes.).    Biotin 5000 MCG SUBL Take 1 tablet by mouth See admin instructions.    budesonide -formoterol  (SYMBICORT ) 80-4.5 MCG/ACT inhaler Inhale 2 puffs into the lungs 2 (two) times daily.    Cetirizine HCl (ZYRTEC ALLERGY PO) Take 1 tablet by mouth daily as needed (for allergies).    EPINEPHrine 0.3 mg/0.3 mL IJ SOAJ injection See admin instructions. (Patient not taking: Reported on 08/17/2023)  02/23/2023: No dispense record in the past 12 months   ibuprofen (ADVIL) 200 MG tablet Take 400 mg by mouth every 6 (six) hours as needed.    ketotifen (ZADITOR) 0.025 % ophthalmic solution Place 1 drop into both eyes daily.    MAGNESIUM CITRATE PO Take 1 tablet by mouth 2 (two) times daily.    Menaquinone-7 (VITAMIN K2) 100 MCG CAPS Take 1 capsule by mouth daily.    metoprolol  succinate (TOPROL -XL) 50 MG 24 hr tablet Take 1 tablet (50 mg total) by mouth daily. Take with or immediately following a meal.    olmesartan (BENICAR) 40 MG tablet Take 40 mg by mouth daily.    simvastatin  (ZOCOR ) 20 MG tablet Take 20 mg by mouth. Take one tablet by mouth on Monday, Wed and Fri    VITAMIN D PO Take 4,000 Int'l Units by mouth.    No facility-administered encounter medications on file as of 09/12/2023.    PAST MEDICAL HISTORY: Past Medical History:  Diagnosis Date   Basal cell carcinoma 1994  right side of node   Benign positional vertigo    with rolling over    Celiac disease    Dysrhythmia    Generalized anxiety disorder    GERD (gastroesophageal reflux disease)    Graves disease 2007   thyroid  nodule   Hard of hearing    Heart murmur    History of breast cancer 1995   left - treated with mastectomy   History of radiation therapy 2001   37 sessions following recurrence of breast cancer in left chest wall   Hypertension    Knee pain, bilateral 12/14/2014   Major depressive disorder in remission    Mild persistent asthma, uncomplicated 06/29/2015   Nocturia    OA (osteoarthritis) of hip 09/08/2015   Ovarian mass    small solid nodule 8x15mm: Right ovary - stable since 2000, follow u/s do not show this.   Renal mass 2021   followed by Dr. Secundino Dach, [facility].  Having every 6 month follow up   Seasonal allergies    Stress fracture of metatarsal bone of left foot 12/15/2019   Vegetarian diet     PAST SURGICAL HISTORY: Past Surgical History:  Procedure Laterality Date   APPENDECTOMY      basal cell removal  7/94   right nose   BREAST BIOPSY Right 1987   benign fibroadenoma   breast cancer recurrence Left 01/2000   at site of left chest wall after previous mastectomy with removal and treatment with radiaton.   CARPAL TUNNEL RELEASE Right 05/08/2013   Procedure: RIGHT CARPAL TUNNEL RELEASE;  Surgeon: Amelie Baize., MD;  Location: [facility];  Service: Orthopedics;  Laterality: Right;   CATARACT EXTRACTION     03/20/14 left eye, 12/15 right eye   COLPOSCOPY W/ BIOPSY / CURETTAGE  03/12/12   benign, but + HR HPV   EYE SURGERY     bilat cataract surgery    MASTECTOMY Left 3/95   Stage IIb 0/14 LN ER/ PR + with mastectomy - Dr. Loris Ros   MOHS SURGERY     right nare   OVARY SURGERY  1974   lt   REVERSE SHOULDER ARTHROPLASTY Left 02/26/2023   Procedure: REVERSE SHOULDER ARTHROPLASTY;  Surgeon: Janeth Medicus, MD;  Location: [facility];  Service: Orthopedics;  Laterality: Left;   TONSILLECTOMY AND ADENOIDECTOMY  1950   TOTAL HIP ARTHROPLASTY Right 09/08/2015   Procedure: TOTAL HIP ARTHROPLASTY ANTERIOR APPROACH;  Surgeon: Liliane Rei, MD;  Location: [facility];  Service: Orthopedics;  Laterality: Right;    ALLERGIES: Allergies  Allergen Reactions   Other Swelling    Trees   Molds & Smuts Other (See Comments)   Erythromycin Nausea And Vomiting   Penicillins Hives and Other (See Comments)    Has patient had a PCN reaction causing immediate rash, facial/tongue/throat swelling, SOB or lightheadedness with hypotension: no Has patient had a PCN reaction causing severe rash involving mucus membranes or skin necrosis: no Has patient had a PCN reaction that required hospitalization no Has patient had a PCN reaction occurring within the last 10 years: no If all of the above answers are "NO", then may proceed with Cephalosporin use.    Terbinafine And Related     rash   Hydrochlorothiazide Rash    Pt states caused a sunburn type reaction    Sulfa  Antibiotics Rash    FAMILY HISTORY: Family History  Problem Relation Age of Onset   Breast cancer Mother 13  deceased 76   Osteoporosis Mother    Hypertension Mother    Dementia Mother        unspecified type   Mental illness Mother    Hypertension Father    Heart attack Father    Dementia Father        vascular dementia with history of "mini-strokes"   Breast cancer Sister 40       bilateral; recurrent 12 years late   Diabetes Brother    Lung cancer Maternal Grandfather        deceased 58   Cancer Paternal Grandmother        duodenal; deceased 23    SOCIAL HISTORY: Social History   Tobacco Use   Smoking status: Never   Smokeless tobacco: Never  Vaping Use   Vaping status: Never Used  Substance Use Topics   Alcohol  use: Yes    Comment: rare   Drug use: No   Social History   Social History Narrative   Right handed   Lives alone      Are you currently employed ?    What is your current occupation?retired   Do you live at home alone?   What type of home do you live in: 1 story or 2 story? two   Caffiene 1 cup     Objective:  Vital Signs:  BP (!) 172/75 Comment: Pt report she is going to call doctor as soon as she gets home  Pulse 74   Ht 5\' 2"  (1.575 m)   Wt 152 lb (68.9 kg)   LMP 04/25/2003   SpO2 96%   BMI 27.80 kg/m   General: General appearance: Awake and alert. No distress. Cooperative with exam.  Skin: No obvious rash or jaundice. HEENT: Atraumatic. Anicteric. Lungs: Non-labored breathing on room air  Extremities: LUE in sling  Neurological: Mental Status: Alert. Speech fluent. No pseudobulbar affect Cranial Nerves: CNII: No RAPD. Visual fields intact. CNIII, IV, VI: PERRL. No nystagmus. EOMI. CN V: Facial sensation intact bilaterally to fine touch. CN VII: Facial muscles symmetric and strong. No ptosis at rest. CN VIII: Hears finger rub well bilaterally. CN IX: No hypophonia. CN X: Palate elevates symmetrically. CN XI: Full  strength shoulder shrug bilaterally. CN XII: Tongue protrusion full and midline. No atrophy or fasciculations. No significant dysarthria Motor: Tone is normal.  Individual muscle group testing (MRC grade out of 5):  Movement     Neck flexion 5    Neck extension 5     Right Left   Shoulder abduction 5 - Left proximal muscles  Elbow flexion 5 - Not tested due to sling  Elbow extension 5 - LUE immobilized  Finger abduction - FDI 5 5   Finger abduction - ADM 5 5   Finger extension 5 5   Finger distal flexion - 2/3 5 5    Finger distal flexion - 4/5 5 5    Thumb flexion - FPL 5 5   Thumb abduction - APB 5 5    Hip flexion 5 5   Hip extension 5 5   Hip adduction 5 5   Hip abduction 5 5   Knee extension 5 5   Knee flexion 5 5   Dorsiflexion 5 5   Plantarflexion 5 5     Reflexes:  Right Left  Bicep 2+ -  Tricep 2+ -  BrRad 2+ -  Knee 2+ 2+  Ankle 1+ 1+   Sensation: Vibration: Absent in left great toe, otherwise intact  Coordination: Intact finger-to- nose-finger bilaterally. Gait: Able to rise from chair with arms crossed unassisted. Normal, narrow-based gait.   Lab and Test Review: New results: 04/05/23: B6 wnl B1 wnl IFE: no M protein  MRI lumbar spine wo contrast (04/28/23): IMPRESSION: 1. Moderate to severe multilevel degenerative disc and facet disease resulting in mild-to-moderate spinal canal narrowing at L2-L5. 2. Moderate to severe neural foraminal narrowing at T11-T12 (left), L1-L2 (right), L3-L4 (bilateral), and L4-L5 (left).  Previously reviewed results: 02/26/23: BMP significant for glucose 118, Cr 1.05, Na 133 (chronic) CBC w/ diff significant for Hb 11.7 (chronic), MCV 89.2   External labs: 12/04/22: Vit D wnl HbA1c: 6.0 TSH wnl Lipid panel: tChol 160,, LDL 72, TG 57 B12: 498 T-transglutaminas IgA elevated (6)   Imaging: CT head and CTA head and neck (02/22/23): IMPRESSION: 1. Note that the presence of iodinated contrast markedly limits  the ability to assess for intracranial hemorrhage within this limitation, no evidence of an extra-axial fluid collection. 2. No emergent large vessel occlusion. 3. No acute fracture or traumatic listhesis of the cervical spine. 4. Heterogeneous and multinodular thyroid  gland with a dominant nodule in the thyroid  isthmus measuring up to 1.9 cm. Recommend further evaluation with a nonemergent dedicated thyroid  ultrasound if not previously performed.   MRI brain w/wo contrast (03/10/20): IMPRESSION: No acute intracranial abnormality.  ASSESSMENT: This is Sarah Underwood, a 78 y.o. female with numbness and burning in legs, imbalance, and infrequent falls. Previously symptoms were worse on the right than left, but today symptoms appear more on left (sensory) than right. Her MRI lumbar spine did show significant stenosis at multiple levels, which may explain symptoms. Overall, her symptoms are stable to improved.  Plan: -Continue home PT exercises -Patient not interested in neuropathic medication at this time -Fall precautions discussed -Patient will establish care in [place], [place]. She will reach out if she is not able to get in with them  Return to clinic as needed  Total time spent reviewing records, interview, history/exam, documentation, and coordination of care on day of encounter:  30 min  Rommie Coats, MD

## 2023-09-06 DIAGNOSIS — M50322 Other cervical disc degeneration at C5-C6 level: Secondary | ICD-10-CM | POA: Diagnosis not present

## 2023-09-06 DIAGNOSIS — M51362 Other intervertebral disc degeneration, lumbar region with discogenic back pain and lower extremity pain: Secondary | ICD-10-CM | POA: Diagnosis not present

## 2023-09-06 DIAGNOSIS — M9903 Segmental and somatic dysfunction of lumbar region: Secondary | ICD-10-CM | POA: Diagnosis not present

## 2023-09-06 DIAGNOSIS — M9901 Segmental and somatic dysfunction of cervical region: Secondary | ICD-10-CM | POA: Diagnosis not present

## 2023-09-10 DIAGNOSIS — I972 Postmastectomy lymphedema syndrome: Secondary | ICD-10-CM | POA: Diagnosis not present

## 2023-09-11 DIAGNOSIS — Z96612 Presence of left artificial shoulder joint: Secondary | ICD-10-CM | POA: Diagnosis not present

## 2023-09-12 ENCOUNTER — Encounter: Payer: Self-pay | Admitting: Neurology

## 2023-09-12 ENCOUNTER — Ambulatory Visit: Payer: Medicare PPO | Admitting: Neurology

## 2023-09-12 VITALS — BP 172/75 | HR 74 | Ht 62.0 in | Wt 152.0 lb

## 2023-09-12 DIAGNOSIS — R202 Paresthesia of skin: Secondary | ICD-10-CM | POA: Diagnosis not present

## 2023-09-12 DIAGNOSIS — M5416 Radiculopathy, lumbar region: Secondary | ICD-10-CM | POA: Diagnosis not present

## 2023-09-12 DIAGNOSIS — R2689 Other abnormalities of gait and mobility: Secondary | ICD-10-CM | POA: Diagnosis not present

## 2023-09-12 DIAGNOSIS — G629 Polyneuropathy, unspecified: Secondary | ICD-10-CM | POA: Diagnosis not present

## 2023-09-12 DIAGNOSIS — M545 Low back pain, unspecified: Secondary | ICD-10-CM | POA: Diagnosis not present

## 2023-09-12 DIAGNOSIS — R200 Anesthesia of skin: Secondary | ICD-10-CM | POA: Diagnosis not present

## 2023-09-12 DIAGNOSIS — W19XXXS Unspecified fall, sequela: Secondary | ICD-10-CM | POA: Diagnosis not present

## 2023-09-12 DIAGNOSIS — G8929 Other chronic pain: Secondary | ICD-10-CM

## 2023-09-12 NOTE — Patient Instructions (Signed)
 Continue exercises given by PT at home.  If you need help establishing care with neurology in [place] after moving, please let me know.  Please let me know if you have any questions or concerns in the meantime.   The physicians and staff at [facility] are committed to providing excellent care. You may receive a survey requesting feedback about your experience at our office. We strive to receive "very good" responses to the survey questions. If you feel that your experience would prevent you from giving the office a "very good " response, please contact our office to try to remedy the situation. We may be reached at [phone]. Thank you for taking the time out of your busy day to complete the survey.  Sarah Coats, MD  Neurology  Preventing Falls at [facility] are common, often dreaded events in the lives of older people. Aside from the obvious injuries and even death that may result, fall can cause wide-ranging consequences including loss of independence, mental decline, decreased activity and mobility. Younger people are also at risk of falling, especially those with chronic illnesses and fatigue.  Ways to reduce risk for falling Examine diet and medications. Warm foods and alcohol  dilate blood vessels, which can lead to dizziness when standing. Sleep aids, antidepressants and pain medications can also increase the likelihood of a fall.  Get a vision exam. Poor vision, cataracts and glaucoma increase the chances of falling.  Check foot gear. Shoes should fit snugly and have a sturdy, nonskid sole and a broad, low heel  Participate in a physician-approved exercise program to build and maintain muscle strength and improve balance and coordination. Programs that use ankle weights or stretch bands are excellent for muscle-strengthening. Water  aerobics programs and low-impact Tai Chi programs have also been shown to improve balance and coordination.  Increase vitamin D intake. Vitamin  D improves muscle strength and increases the amount of calcium  the body is able to absorb and deposit in bones.  How to prevent falls from common hazards Floors - Remove all loose wires, cords, and throw rugs. Minimize clutter. Make sure rugs are anchored and smooth. Keep furniture in its usual place.  Chairs -- Use chairs with straight backs, armrests and firm seats. Add firm cushions to existing pieces to add height.  Bathroom - Install grab bars and non-skid tape in the tub or shower. Use a bathtub transfer bench or a shower chair with a back support Use an elevated toilet seat and/or safety rails to assist standing from a low surface. Do not use towel racks or bathroom tissue holders to help you stand.  Lighting - Make sure halls, stairways, and entrances are well-lit. Install a night light in your bathroom or hallway. Make sure there is a light switch at the top and bottom of the staircase. Turn lights on if you get up in the middle of the night. Make sure lamps or light switches are within reach of the bed if you have to get up during the night.  Kitchen - Install non-skid rubber mats near the sink and stove. Clean spills immediately. Store frequently used utensils, pots, pans between waist and eye level. This helps prevent reaching and bending. Sit when getting things out of lower cupboards.  Living room/ Bedrooms - Place furniture with wide spaces in between, giving enough room to move around. Establish a route through the living room that gives you something to hold onto as you walk.  Stairs - Make sure treads, rails, and  rugs are secure. Install a rail on both sides of the stairs. If stairs are a threat, it might be helpful to arrange most of your activities on the lower level to reduce the number of times you must climb the stairs.  Entrances and doorways - Install metal handles on the walls adjacent to the doorknobs of all doors to make it more secure as you travel through the  doorway.  Tips for maintaining balance Keep at least one hand free at all times. Try using a backpack or fanny pack to hold things rather than carrying them in your hands. Never carry objects in both hands when walking as this interferes with keeping your balance.  Attempt to swing both arms from front to back while walking. This might require a conscious effort if Parkinson's disease has diminished your movement. It will, however, help you to maintain balance and posture, and reduce fatigue.  Consciously lift your feet off of the ground when walking. Shuffling and dragging of the feet is a common culprit in losing your balance.  When trying to navigate turns, use a "U" technique of facing forward and making a wide turn, rather than pivoting sharply.  Try to stand with your feet shoulder-length apart. When your feet are close together for any length of time, you increase your risk of losing your balance and falling.  Do one thing at a time. Don't try to walk and accomplish another task, such as reading or looking around. The decrease in your automatic reflexes complicates motor function, so the less distraction, the better.  Do not wear rubber or gripping soled shoes, they might "catch" on the floor and cause tripping.  Move slowly when changing positions. Use deliberate, concentrated movements and, if needed, use a grab bar or walking aid. Count 15 seconds between each movement. For example, when rising from a seated position, wait 15 seconds after standing to begin walking.  If balance is a continuous problem, you might want to consider a walking aid such as a cane, walking stick, or walker. Once you've mastered walking with help, you might be ready to try it on your own again.

## 2023-09-13 DIAGNOSIS — M9901 Segmental and somatic dysfunction of cervical region: Secondary | ICD-10-CM | POA: Diagnosis not present

## 2023-09-13 DIAGNOSIS — M51362 Other intervertebral disc degeneration, lumbar region with discogenic back pain and lower extremity pain: Secondary | ICD-10-CM | POA: Diagnosis not present

## 2023-09-13 DIAGNOSIS — M9903 Segmental and somatic dysfunction of lumbar region: Secondary | ICD-10-CM | POA: Diagnosis not present

## 2023-09-13 DIAGNOSIS — M50322 Other cervical disc degeneration at C5-C6 level: Secondary | ICD-10-CM | POA: Diagnosis not present

## 2023-09-20 DIAGNOSIS — M50322 Other cervical disc degeneration at C5-C6 level: Secondary | ICD-10-CM | POA: Diagnosis not present

## 2023-09-20 DIAGNOSIS — M9901 Segmental and somatic dysfunction of cervical region: Secondary | ICD-10-CM | POA: Diagnosis not present

## 2023-09-20 DIAGNOSIS — M9903 Segmental and somatic dysfunction of lumbar region: Secondary | ICD-10-CM | POA: Diagnosis not present

## 2023-09-20 DIAGNOSIS — M51362 Other intervertebral disc degeneration, lumbar region with discogenic back pain and lower extremity pain: Secondary | ICD-10-CM | POA: Diagnosis not present

## 2023-09-21 DIAGNOSIS — R002 Palpitations: Secondary | ICD-10-CM | POA: Diagnosis not present

## 2023-09-21 DIAGNOSIS — E1129 Type 2 diabetes mellitus with other diabetic kidney complication: Secondary | ICD-10-CM | POA: Diagnosis not present

## 2023-09-21 DIAGNOSIS — G64 Other disorders of peripheral nervous system: Secondary | ICD-10-CM | POA: Diagnosis not present

## 2023-09-21 DIAGNOSIS — R413 Other amnesia: Secondary | ICD-10-CM | POA: Diagnosis not present

## 2023-09-21 DIAGNOSIS — R600 Localized edema: Secondary | ICD-10-CM | POA: Diagnosis not present

## 2023-09-21 DIAGNOSIS — I10 Essential (primary) hypertension: Secondary | ICD-10-CM | POA: Diagnosis not present

## 2023-09-21 DIAGNOSIS — E041 Nontoxic single thyroid nodule: Secondary | ICD-10-CM | POA: Diagnosis not present

## 2023-09-21 DIAGNOSIS — I493 Ventricular premature depolarization: Secondary | ICD-10-CM | POA: Diagnosis not present

## 2023-09-21 DIAGNOSIS — C50912 Malignant neoplasm of unspecified site of left female breast: Secondary | ICD-10-CM | POA: Diagnosis not present

## 2023-09-27 DIAGNOSIS — M51362 Other intervertebral disc degeneration, lumbar region with discogenic back pain and lower extremity pain: Secondary | ICD-10-CM | POA: Diagnosis not present

## 2023-09-27 DIAGNOSIS — M50322 Other cervical disc degeneration at C5-C6 level: Secondary | ICD-10-CM | POA: Diagnosis not present

## 2023-09-27 DIAGNOSIS — M9903 Segmental and somatic dysfunction of lumbar region: Secondary | ICD-10-CM | POA: Diagnosis not present

## 2023-09-27 DIAGNOSIS — M9901 Segmental and somatic dysfunction of cervical region: Secondary | ICD-10-CM | POA: Diagnosis not present

## 2023-10-02 DIAGNOSIS — Z96612 Presence of left artificial shoulder joint: Secondary | ICD-10-CM | POA: Diagnosis not present

## 2023-10-02 DIAGNOSIS — M791 Myalgia, unspecified site: Secondary | ICD-10-CM | POA: Diagnosis not present

## 2023-10-05 ENCOUNTER — Encounter (HOSPITAL_BASED_OUTPATIENT_CLINIC_OR_DEPARTMENT_OTHER): Payer: Self-pay | Admitting: Obstetrics & Gynecology

## 2023-10-05 ENCOUNTER — Ambulatory Visit (HOSPITAL_BASED_OUTPATIENT_CLINIC_OR_DEPARTMENT_OTHER): Admitting: Obstetrics & Gynecology

## 2023-10-05 VITALS — BP 184/77 | HR 61 | Ht 62.0 in | Wt 148.0 lb

## 2023-10-05 DIAGNOSIS — L02214 Cutaneous abscess of groin: Secondary | ICD-10-CM

## 2023-10-05 MED ORDER — DOXYCYCLINE HYCLATE 100 MG PO CAPS: 100.0000 mg | ORAL_CAPSULE | Freq: Two times a day (BID) | ORAL | 0 refills | Status: AC

## 2023-10-05 NOTE — Progress Notes (Signed)
 GYNECOLOGY  VISIT  CC:   vulvar cyst that is draining  HPI: 78 y.o. G29P1001 Divorced White or Caucasian female here for concerns about worsening vulvar cyst.  This has been more red and swollen this week but she started to have more pain last night and drainage.  Called and was advised to come into the office today.  No fever.  Reports lesion is very sore.  Many years ago, Dr. Danise Durie tried to remove it.   Past Medical History:  Diagnosis Date   Basal cell carcinoma 1994   right side of node   Benign positional vertigo    with rolling over    Celiac disease    Dysrhythmia    Generalized anxiety disorder    GERD (gastroesophageal reflux disease)    Graves disease 2007   thyroid  nodule   Hard of hearing    Heart murmur    History of breast cancer 1995   left - treated with mastectomy   History of radiation therapy 2001   37 sessions following recurrence of breast cancer in left chest wall   Hypertension    Knee pain, bilateral 12/14/2014   Major depressive disorder in remission    Mild persistent asthma, uncomplicated 06/29/2015   Nocturia    OA (osteoarthritis) of hip 09/08/2015   Ovarian mass    small solid nodule 8x53mm: Right ovary - stable since 2000, follow u/s do not show this.   Renal mass 2021   followed by Dr. Secundino Dach, [facility].  Having every 6 month follow up   Seasonal allergies    Stress fracture of metatarsal bone of left foot 12/15/2019   Vegetarian diet     MEDS:   Current Outpatient Medications on File Prior to Visit  Medication Sig Dispense Refill   acetaminophen  (TYLENOL ) 650 MG CR tablet Take 1,300 mg by mouth every 8 (eight) hours as needed for pain.     albuterol  (PROVENTIL  HFA;VENTOLIN  HFA) 108 (90 Base) MCG/ACT inhaler Inhale 2 puffs into the lungs every 4 (four) hours as needed for wheezing. 1 Inhaler 3   amLODipine  (NORVASC ) 2.5 MG tablet Take 1 tablet by mouth daily.     anastrozole  (ARIMIDEX ) 1 MG tablet TAKE 1 TABLET BY MOUTH EVERY DAY 90 tablet 3    Artificial Tear Solution (TEARS NATURALE OP) Place 2 drops into both eyes daily as needed (For dry eyes.).     Biotin 5000 MCG SUBL Take 1 tablet by mouth See admin instructions.     budesonide -formoterol  (SYMBICORT ) 80-4.5 MCG/ACT inhaler Inhale 2 puffs into the lungs 2 (two) times daily.     Cetirizine HCl (ZYRTEC ALLERGY PO) Take 1 tablet by mouth daily as needed (for allergies).     EPINEPHrine 0.3 mg/0.3 mL IJ SOAJ injection See admin instructions.     ibuprofen (ADVIL) 200 MG tablet Take 400 mg by mouth every 6 (six) hours as needed.     ketotifen (ZADITOR) 0.025 % ophthalmic solution Place 1 drop into both eyes daily.     MAGNESIUM CITRATE PO Take 1 tablet by mouth 2 (two) times daily.     Menaquinone-7 (VITAMIN K2) 100 MCG CAPS Take 1 capsule by mouth daily.     metoprolol  succinate (TOPROL -XL) 50 MG 24 hr tablet Take 1 tablet (50 mg total) by mouth daily. Take with or immediately following a meal. 30 tablet 0   olmesartan (BENICAR) 40 MG tablet Take 40 mg by mouth daily.     simvastatin  (ZOCOR ) 20 MG tablet  Take 20 mg by mouth. Take one tablet by mouth on Monday, Wed and Fri     VITAMIN D PO Take 4,000 Int'l Units by mouth.     No current facility-administered medications on file prior to visit.    ALLERGIES: Other, Molds & smuts, Erythromycin, Penicillins, Terbinafine and related, Hydrochlorothiazide, and Sulfa antibiotics  SH:  divorced  Review of Systems  Constitutional: Negative.   Genitourinary:        Vulvar lesion    PHYSICAL EXAMINATION:    BP (!) 184/77 (BP Location: Right Arm, Patient Position: Sitting, Cuff Size: Normal)   Pulse 61   Ht 5' 2 (1.575 m)   Wt 148 lb (67.1 kg)   LMP 04/25/2003   BMI 27.07 kg/m     Physical Exam  Procedure:  consent obtained.  Area cleansed with Betadine  x 3.  1.5cc 1% lidocaine  used to instill lesion.  Using #11 blade, lesion opened.  Sebaceous material present.  Cyst wall noted.  Cyst wall was partly removed but pt requested  stopping.  Area was packed with iodoform gauze.  Instructions for removal reviewed with pt.  Chaperone was present for exam.  Assessment/Plan: 1. Groin abscess (Primary) - doxycycline  (VIBRAMYCIN ) 100 MG capsule; Take 1 capsule (100 mg total) by mouth 2 (two) times daily. Take with food as can cause GI distress.  Dispense: 14 capsule; Refill: 0 - WOUND CULTURE -pt is to remove packing tomorrow.  Should use soap and water  only for cleaning.

## 2023-10-09 ENCOUNTER — Ambulatory Visit (HOSPITAL_BASED_OUTPATIENT_CLINIC_OR_DEPARTMENT_OTHER): Payer: Self-pay | Admitting: Obstetrics & Gynecology

## 2023-10-09 LAB — WOUND CULTURE

## 2023-10-10 ENCOUNTER — Other Ambulatory Visit (HOSPITAL_BASED_OUTPATIENT_CLINIC_OR_DEPARTMENT_OTHER): Payer: Self-pay | Admitting: Obstetrics & Gynecology

## 2023-10-10 ENCOUNTER — Telehealth (HOSPITAL_BASED_OUTPATIENT_CLINIC_OR_DEPARTMENT_OTHER): Payer: Self-pay | Admitting: *Deleted

## 2023-10-10 NOTE — Telephone Encounter (Signed)
 TC from pt in VM.  Pt received a MyChart message from Dr Annabell Key on wound culture results and that Doxy the prescription that she was given may not be the best one for the bacteria that grew out. The patient wanted to let Dr Annabell Key know that she is much better than before and it seems to be clearing up although she still cannot wear undergarments due to it pressing on the area.  Please advise if ok or if needs to have an additional prescription called in.  Thanks Terri Fester CMA

## 2023-10-11 DIAGNOSIS — M50322 Other cervical disc degeneration at C5-C6 level: Secondary | ICD-10-CM | POA: Diagnosis not present

## 2023-10-11 DIAGNOSIS — M9903 Segmental and somatic dysfunction of lumbar region: Secondary | ICD-10-CM | POA: Diagnosis not present

## 2023-10-11 DIAGNOSIS — M51362 Other intervertebral disc degeneration, lumbar region with discogenic back pain and lower extremity pain: Secondary | ICD-10-CM | POA: Diagnosis not present

## 2023-10-11 DIAGNOSIS — M9901 Segmental and somatic dysfunction of cervical region: Secondary | ICD-10-CM | POA: Diagnosis not present

## 2023-10-12 ENCOUNTER — Other Ambulatory Visit (HOSPITAL_BASED_OUTPATIENT_CLINIC_OR_DEPARTMENT_OTHER): Payer: Self-pay | Admitting: Obstetrics & Gynecology

## 2023-10-12 DIAGNOSIS — L02214 Cutaneous abscess of groin: Secondary | ICD-10-CM

## 2023-10-12 MED ORDER — CIPROFLOXACIN HCL 500 MG PO TABS: 250.0000 mg | ORAL_TABLET | Freq: Two times a day (BID) | ORAL | 0 refills | Status: AC

## 2023-10-12 NOTE — Progress Notes (Signed)
 Rx for cipro sent to pharmacy.  Pt with many allergies.  Left message after review of DPR regarding new prescription and risks/side effects.

## 2023-10-12 NOTE — Telephone Encounter (Signed)
 Rx for ciprofloxin sent to pharmacy and voicemail left for pt after reviewing DPR.

## 2023-10-16 ENCOUNTER — Telehealth (HOSPITAL_BASED_OUTPATIENT_CLINIC_OR_DEPARTMENT_OTHER): Payer: Self-pay

## 2023-10-16 NOTE — Telephone Encounter (Signed)
 I spoke with patient. Separate message sent to [facility]. tbw

## 2023-10-16 NOTE — Telephone Encounter (Signed)
 Called patient and spoke to her about the below information. Patient expressed understanding and was very thankful for us  responding. She will keep an eye on the healing area and if any concerns come up she will give us  a call. tbw

## 2023-10-16 NOTE — Telephone Encounter (Signed)
 Patient called 05/17/2023 with concerns about taking the cipro  that Cleotilde has prescribed for her to take. She is concerned about all the interactions that it has with many of the medications that she is currently taking. She seems to think that the problem is not internal but external. She was on an antibiotic for a vulvar cyst that Cleotilde worked on at her last appointment. Miller called and said that the antibiotic she was on was not what she needed to be on and that it needed to be changed to Cipro . However, patient states at that time she was actually doing fine and having no problems. She did not start having problems until she started moving. All the bending, lifting and cleaning she feels like caused her to be irritated by the type of underwear she was wearing. Because she did not know what to do, she just started going without underwear which has helped tremendously. Her concern now is, what should she do. What if it is only the irritation of the underwear and she don't need to take the Cipro  that interacts with her other meds or What if it is an infection brewing where Cleotilde tried to take out the vulvar at her last visit? Please advise.

## 2023-10-18 DIAGNOSIS — M50322 Other cervical disc degeneration at C5-C6 level: Secondary | ICD-10-CM | POA: Diagnosis not present

## 2023-10-18 DIAGNOSIS — M9903 Segmental and somatic dysfunction of lumbar region: Secondary | ICD-10-CM | POA: Diagnosis not present

## 2023-10-18 DIAGNOSIS — M51362 Other intervertebral disc degeneration, lumbar region with discogenic back pain and lower extremity pain: Secondary | ICD-10-CM | POA: Diagnosis not present

## 2023-10-18 DIAGNOSIS — M9901 Segmental and somatic dysfunction of cervical region: Secondary | ICD-10-CM | POA: Diagnosis not present

## 2023-10-30 DIAGNOSIS — M25512 Pain in left shoulder: Secondary | ICD-10-CM | POA: Diagnosis not present

## 2023-10-30 DIAGNOSIS — Z96612 Presence of left artificial shoulder joint: Secondary | ICD-10-CM | POA: Diagnosis not present

## 2023-11-08 DIAGNOSIS — M50322 Other cervical disc degeneration at C5-C6 level: Secondary | ICD-10-CM | POA: Diagnosis not present

## 2023-11-08 DIAGNOSIS — M9901 Segmental and somatic dysfunction of cervical region: Secondary | ICD-10-CM | POA: Diagnosis not present

## 2023-11-08 DIAGNOSIS — M9903 Segmental and somatic dysfunction of lumbar region: Secondary | ICD-10-CM | POA: Diagnosis not present

## 2023-11-08 DIAGNOSIS — M51362 Other intervertebral disc degeneration, lumbar region with discogenic back pain and lower extremity pain: Secondary | ICD-10-CM | POA: Diagnosis not present

## 2023-11-09 DIAGNOSIS — M25512 Pain in left shoulder: Secondary | ICD-10-CM | POA: Diagnosis not present

## 2023-11-11 ENCOUNTER — Other Ambulatory Visit: Payer: Self-pay | Admitting: Oncology

## 2023-11-11 DIAGNOSIS — C50511 Malignant neoplasm of lower-outer quadrant of right female breast: Secondary | ICD-10-CM

## 2023-11-15 DIAGNOSIS — M50322 Other cervical disc degeneration at C5-C6 level: Secondary | ICD-10-CM | POA: Diagnosis not present

## 2023-11-15 DIAGNOSIS — M9901 Segmental and somatic dysfunction of cervical region: Secondary | ICD-10-CM | POA: Diagnosis not present

## 2023-11-15 DIAGNOSIS — M51362 Other intervertebral disc degeneration, lumbar region with discogenic back pain and lower extremity pain: Secondary | ICD-10-CM | POA: Diagnosis not present

## 2023-11-15 DIAGNOSIS — M9903 Segmental and somatic dysfunction of lumbar region: Secondary | ICD-10-CM | POA: Diagnosis not present

## 2023-11-22 DIAGNOSIS — Z96612 Presence of left artificial shoulder joint: Secondary | ICD-10-CM | POA: Diagnosis not present

## 2023-11-27 DIAGNOSIS — E1129 Type 2 diabetes mellitus with other diabetic kidney complication: Secondary | ICD-10-CM | POA: Diagnosis not present

## 2023-11-27 DIAGNOSIS — I10 Essential (primary) hypertension: Secondary | ICD-10-CM | POA: Diagnosis not present

## 2023-11-27 DIAGNOSIS — E041 Nontoxic single thyroid nodule: Secondary | ICD-10-CM | POA: Diagnosis not present

## 2023-11-27 DIAGNOSIS — E785 Hyperlipidemia, unspecified: Secondary | ICD-10-CM | POA: Diagnosis not present

## 2023-11-27 DIAGNOSIS — E039 Hypothyroidism, unspecified: Secondary | ICD-10-CM | POA: Diagnosis not present

## 2023-11-27 DIAGNOSIS — M810 Age-related osteoporosis without current pathological fracture: Secondary | ICD-10-CM | POA: Diagnosis not present

## 2023-11-27 DIAGNOSIS — E7849 Other hyperlipidemia: Secondary | ICD-10-CM | POA: Diagnosis not present

## 2023-12-04 DIAGNOSIS — Z1339 Encounter for screening examination for other mental health and behavioral disorders: Secondary | ICD-10-CM | POA: Diagnosis not present

## 2023-12-04 DIAGNOSIS — I493 Ventricular premature depolarization: Secondary | ICD-10-CM | POA: Diagnosis not present

## 2023-12-04 DIAGNOSIS — M8580 Other specified disorders of bone density and structure, unspecified site: Secondary | ICD-10-CM | POA: Diagnosis not present

## 2023-12-04 DIAGNOSIS — J45909 Unspecified asthma, uncomplicated: Secondary | ICD-10-CM | POA: Diagnosis not present

## 2023-12-04 DIAGNOSIS — E1129 Type 2 diabetes mellitus with other diabetic kidney complication: Secondary | ICD-10-CM | POA: Diagnosis not present

## 2023-12-04 DIAGNOSIS — Z8781 Personal history of (healed) traumatic fracture: Secondary | ICD-10-CM | POA: Diagnosis not present

## 2023-12-04 DIAGNOSIS — R002 Palpitations: Secondary | ICD-10-CM | POA: Diagnosis not present

## 2023-12-04 DIAGNOSIS — I10 Essential (primary) hypertension: Secondary | ICD-10-CM | POA: Diagnosis not present

## 2023-12-04 DIAGNOSIS — Z0000 Encounter for general adult medical examination without abnormal findings: Secondary | ICD-10-CM | POA: Diagnosis not present

## 2023-12-04 DIAGNOSIS — C50912 Malignant neoplasm of unspecified site of left female breast: Secondary | ICD-10-CM | POA: Diagnosis not present

## 2023-12-04 DIAGNOSIS — K900 Celiac disease: Secondary | ICD-10-CM | POA: Diagnosis not present

## 2023-12-04 DIAGNOSIS — Z1331 Encounter for screening for depression: Secondary | ICD-10-CM | POA: Diagnosis not present

## 2023-12-06 DIAGNOSIS — R82998 Other abnormal findings in urine: Secondary | ICD-10-CM | POA: Diagnosis not present

## 2024-02-07 ENCOUNTER — Other Ambulatory Visit: Payer: Self-pay | Admitting: *Deleted

## 2024-02-07 ENCOUNTER — Ambulatory Visit

## 2024-02-07 DIAGNOSIS — R002 Palpitations: Secondary | ICD-10-CM

## 2024-02-07 NOTE — Progress Notes (Unsigned)
 Enrolled for [employer] to mail a ZIO XT long term holter monitor to the patients address on file.   EP to read

## 2024-02-08 NOTE — Telephone Encounter (Signed)
 Patient is following up regarding this matter. She requests a call back to discuss. She doesn't know why monitor was ordered.

## 2024-05-06 ENCOUNTER — Other Ambulatory Visit: Payer: Self-pay | Admitting: Oncology

## 2024-05-06 DIAGNOSIS — C50511 Malignant neoplasm of lower-outer quadrant of right female breast: Secondary | ICD-10-CM

## 2024-05-08 NOTE — Telephone Encounter (Signed)
 Verbal from Dr Cloretta
